# Patient Record
Sex: Female | Born: 1950 | Race: White | Hispanic: No | Smoking: Never smoker
Health system: Southern US, Community
[De-identification: ages and names within clinical notes are randomized; demographics above are authoritative.]

## PROBLEM LIST (undated history)

## (undated) ENCOUNTER — Ambulatory Visit: Disposition: A | Discharge status: Other Acute Inpatient Hospital

## (undated) DIAGNOSIS — M1990 Unspecified osteoarthritis, unspecified site: Secondary | ICD-10-CM

## (undated) DIAGNOSIS — C801 Malignant (primary) neoplasm, unspecified: Secondary | ICD-10-CM

## (undated) DIAGNOSIS — M4800 Spinal stenosis, site unspecified: Secondary | ICD-10-CM

## (undated) DIAGNOSIS — E039 Hypothyroidism, unspecified: Secondary | ICD-10-CM

## (undated) DIAGNOSIS — Z9981 Dependence on supplemental oxygen: Secondary | ICD-10-CM

## (undated) DIAGNOSIS — Z8679 Personal history of other diseases of the circulatory system: Secondary | ICD-10-CM

## (undated) DIAGNOSIS — I639 Cerebral infarction, unspecified: Secondary | ICD-10-CM

## (undated) DIAGNOSIS — R42 Dizziness and giddiness: Secondary | ICD-10-CM

## (undated) DIAGNOSIS — M5126 Other intervertebral disc displacement, lumbar region: Secondary | ICD-10-CM

## (undated) DIAGNOSIS — E785 Hyperlipidemia, unspecified: Secondary | ICD-10-CM

## (undated) DIAGNOSIS — G4730 Sleep apnea, unspecified: Secondary | ICD-10-CM

## (undated) DIAGNOSIS — Z9889 Other specified postprocedural states: Secondary | ICD-10-CM

## (undated) DIAGNOSIS — E079 Disorder of thyroid, unspecified: Secondary | ICD-10-CM

## (undated) DIAGNOSIS — M797 Fibromyalgia: Secondary | ICD-10-CM

## (undated) DIAGNOSIS — J449 Chronic obstructive pulmonary disease, unspecified: Secondary | ICD-10-CM

## (undated) DIAGNOSIS — R519 Headache, unspecified: Secondary | ICD-10-CM

## (undated) DIAGNOSIS — M539 Dorsopathy, unspecified: Secondary | ICD-10-CM

## (undated) DIAGNOSIS — F32A Depression, unspecified: Secondary | ICD-10-CM

## (undated) HISTORY — DX: Personal history of other diseases of the circulatory system: Z86.79

## (undated) HISTORY — DX: Other intervertebral disc displacement, lumbar region: M51.26

## (undated) HISTORY — DX: Disorder of thyroid, unspecified: E07.9

## (undated) HISTORY — DX: Other specified postprocedural states: Z98.890

## (undated) HISTORY — DX: Hypothyroidism, unspecified: E03.9

## (undated) HISTORY — PX: LUMBAR FUSION: SHX111

## (undated) HISTORY — DX: Sleep apnea, unspecified: G47.30

## (undated) HISTORY — PX: EYE SURGERY: SHX253

## (undated) HISTORY — DX: Chronic obstructive pulmonary disease, unspecified: J44.9

## (undated) HISTORY — DX: Hyperlipidemia, unspecified: E78.5

## (undated) HISTORY — PX: TOTAL SHOULDER REPLACEMENT: SUR1217

## (undated) HISTORY — PX: JOINT REPLACEMENT: SHX530

## (undated) HISTORY — DX: Cerebral infarction, unspecified: I63.9

## (undated) HISTORY — PX: APPENDECTOMY: SHX54

## (undated) HISTORY — DX: Unspecified osteoarthritis, unspecified site: M19.90

## (undated) HISTORY — PX: REPLACEMENT TOTAL KNEE: SUR1224

## (undated) HISTORY — PX: TOTAL THYROIDECTOMY: SHX2547

## (undated) HISTORY — PX: CARPAL TUNNEL RELEASE: SHX101

## (undated) HISTORY — PX: ABDOMINAL HYSTERECTOMY: SHX81

---

## 2004-10-31 ENCOUNTER — Ambulatory Visit: Payer: Self-pay | Admitting: Rheumatology

## 2004-12-19 ENCOUNTER — Ambulatory Visit: Payer: Self-pay | Admitting: Unknown Physician Specialty

## 2005-02-03 ENCOUNTER — Other Ambulatory Visit: Payer: Self-pay

## 2005-02-03 ENCOUNTER — Emergency Department: Payer: Self-pay | Admitting: Emergency Medicine

## 2005-02-27 ENCOUNTER — Ambulatory Visit: Payer: Self-pay | Admitting: Family Medicine

## 2005-03-24 ENCOUNTER — Ambulatory Visit: Payer: Self-pay | Admitting: Family Medicine

## 2005-03-31 ENCOUNTER — Ambulatory Visit: Payer: Self-pay | Admitting: Unknown Physician Specialty

## 2005-05-21 ENCOUNTER — Ambulatory Visit: Payer: Self-pay | Admitting: Rheumatology

## 2005-06-10 ENCOUNTER — Ambulatory Visit: Payer: Self-pay | Admitting: Unknown Physician Specialty

## 2005-06-20 ENCOUNTER — Ambulatory Visit: Payer: Self-pay | Admitting: Unknown Physician Specialty

## 2006-02-02 ENCOUNTER — Ambulatory Visit: Payer: Self-pay | Admitting: Unknown Physician Specialty

## 2006-04-14 ENCOUNTER — Emergency Department: Payer: Self-pay | Admitting: Emergency Medicine

## 2006-07-07 ENCOUNTER — Emergency Department: Payer: Self-pay | Admitting: Emergency Medicine

## 2006-10-03 ENCOUNTER — Emergency Department: Payer: Self-pay | Admitting: Emergency Medicine

## 2006-12-11 ENCOUNTER — Emergency Department: Payer: Self-pay | Admitting: Internal Medicine

## 2007-03-29 ENCOUNTER — Ambulatory Visit: Payer: Self-pay | Admitting: Unknown Physician Specialty

## 2007-05-12 ENCOUNTER — Ambulatory Visit: Payer: Self-pay | Admitting: Unknown Physician Specialty

## 2007-05-12 ENCOUNTER — Other Ambulatory Visit: Payer: Self-pay

## 2007-05-13 ENCOUNTER — Ambulatory Visit: Payer: Self-pay | Admitting: Unknown Physician Specialty

## 2007-05-19 ENCOUNTER — Ambulatory Visit: Payer: Self-pay | Admitting: Unknown Physician Specialty

## 2007-06-21 ENCOUNTER — Ambulatory Visit: Payer: Self-pay | Admitting: Internal Medicine

## 2007-09-09 ENCOUNTER — Emergency Department: Payer: Self-pay | Admitting: Emergency Medicine

## 2007-09-21 ENCOUNTER — Ambulatory Visit: Payer: Self-pay | Admitting: Internal Medicine

## 2008-01-05 ENCOUNTER — Ambulatory Visit: Payer: Self-pay | Admitting: Unknown Physician Specialty

## 2008-03-14 ENCOUNTER — Ambulatory Visit: Payer: Self-pay | Admitting: Internal Medicine

## 2008-04-11 ENCOUNTER — Ambulatory Visit: Payer: Self-pay | Admitting: Nurse Practitioner

## 2008-05-18 ENCOUNTER — Ambulatory Visit: Payer: Self-pay | Admitting: Gastroenterology

## 2008-05-20 ENCOUNTER — Emergency Department: Payer: Self-pay | Admitting: Emergency Medicine

## 2008-05-24 ENCOUNTER — Other Ambulatory Visit: Payer: Self-pay

## 2008-05-24 ENCOUNTER — Ambulatory Visit: Payer: Self-pay | Admitting: Unknown Physician Specialty

## 2008-05-29 ENCOUNTER — Inpatient Hospital Stay: Payer: Self-pay | Admitting: Unknown Physician Specialty

## 2008-08-24 ENCOUNTER — Ambulatory Visit: Payer: Self-pay | Admitting: Unknown Physician Specialty

## 2008-09-25 ENCOUNTER — Ambulatory Visit: Payer: Self-pay | Admitting: Unknown Physician Specialty

## 2008-09-25 ENCOUNTER — Ambulatory Visit: Payer: Self-pay | Admitting: Internal Medicine

## 2008-10-02 ENCOUNTER — Inpatient Hospital Stay: Payer: Self-pay | Admitting: Unknown Physician Specialty

## 2009-01-11 ENCOUNTER — Ambulatory Visit: Payer: Self-pay | Admitting: Unknown Physician Specialty

## 2009-01-18 ENCOUNTER — Ambulatory Visit: Payer: Self-pay | Admitting: Unknown Physician Specialty

## 2009-06-04 ENCOUNTER — Ambulatory Visit: Payer: Self-pay | Admitting: Unknown Physician Specialty

## 2009-06-05 ENCOUNTER — Ambulatory Visit: Payer: Self-pay | Admitting: Gastroenterology

## 2009-06-07 ENCOUNTER — Inpatient Hospital Stay: Payer: Self-pay | Admitting: Unknown Physician Specialty

## 2010-03-28 ENCOUNTER — Ambulatory Visit: Payer: Self-pay

## 2010-04-11 ENCOUNTER — Ambulatory Visit: Payer: Self-pay | Admitting: Unknown Physician Specialty

## 2010-04-17 ENCOUNTER — Ambulatory Visit: Payer: Self-pay | Admitting: Cardiovascular Disease

## 2010-04-18 ENCOUNTER — Ambulatory Visit: Payer: Self-pay | Admitting: Unknown Physician Specialty

## 2010-10-27 ENCOUNTER — Emergency Department: Payer: Self-pay | Admitting: Emergency Medicine

## 2012-03-05 DIAGNOSIS — G43009 Migraine without aura, not intractable, without status migrainosus: Secondary | ICD-10-CM | POA: Insufficient documentation

## 2012-10-06 ENCOUNTER — Ambulatory Visit: Payer: Self-pay | Admitting: Pain Medicine

## 2012-10-15 ENCOUNTER — Ambulatory Visit: Payer: Self-pay | Admitting: Pain Medicine

## 2012-12-06 ENCOUNTER — Ambulatory Visit: Payer: Self-pay | Admitting: Pain Medicine

## 2012-12-06 LAB — SEDIMENTATION RATE: Erythrocyte Sed Rate: 11 mm/hr (ref 0–30)

## 2012-12-15 ENCOUNTER — Ambulatory Visit: Payer: Self-pay | Admitting: Pain Medicine

## 2012-12-23 ENCOUNTER — Ambulatory Visit: Payer: Self-pay | Admitting: Pain Medicine

## 2013-01-12 ENCOUNTER — Ambulatory Visit: Payer: Self-pay | Admitting: Pain Medicine

## 2013-03-11 ENCOUNTER — Ambulatory Visit: Payer: Self-pay | Admitting: Pain Medicine

## 2013-04-28 ENCOUNTER — Ambulatory Visit: Payer: Self-pay | Admitting: Pain Medicine

## 2013-05-30 ENCOUNTER — Ambulatory Visit: Payer: Self-pay | Admitting: Pain Medicine

## 2013-06-02 ENCOUNTER — Ambulatory Visit: Payer: Self-pay | Admitting: Pain Medicine

## 2013-06-27 ENCOUNTER — Ambulatory Visit: Payer: Self-pay | Admitting: Pain Medicine

## 2013-09-09 ENCOUNTER — Ambulatory Visit: Payer: Self-pay | Admitting: Pain Medicine

## 2013-10-18 ENCOUNTER — Ambulatory Visit: Payer: Self-pay | Admitting: Pain Medicine

## 2013-10-31 ENCOUNTER — Ambulatory Visit: Payer: Self-pay | Admitting: Pain Medicine

## 2013-11-04 ENCOUNTER — Ambulatory Visit: Payer: Self-pay | Admitting: Pain Medicine

## 2013-11-10 ENCOUNTER — Ambulatory Visit: Payer: Self-pay | Admitting: Pain Medicine

## 2013-12-05 ENCOUNTER — Ambulatory Visit: Payer: Self-pay | Admitting: Pain Medicine

## 2013-12-08 ENCOUNTER — Ambulatory Visit: Payer: Self-pay | Admitting: Pain Medicine

## 2013-12-26 ENCOUNTER — Ambulatory Visit: Payer: Self-pay | Admitting: Pain Medicine

## 2014-01-03 ENCOUNTER — Ambulatory Visit: Payer: Self-pay | Admitting: Pain Medicine

## 2014-02-20 ENCOUNTER — Ambulatory Visit: Payer: Self-pay | Admitting: Unknown Physician Specialty

## 2014-02-24 ENCOUNTER — Ambulatory Visit: Payer: Self-pay | Admitting: Pain Medicine

## 2014-03-02 ENCOUNTER — Ambulatory Visit: Payer: Self-pay | Admitting: Pain Medicine

## 2014-03-13 ENCOUNTER — Ambulatory Visit: Payer: Self-pay | Admitting: Neurology

## 2014-03-22 ENCOUNTER — Ambulatory Visit: Payer: Self-pay | Admitting: Pain Medicine

## 2014-04-06 ENCOUNTER — Ambulatory Visit: Payer: Self-pay | Admitting: Pain Medicine

## 2014-05-18 ENCOUNTER — Inpatient Hospital Stay (HOSPITAL_COMMUNITY): Admit: 2014-05-18 | Payer: Self-pay | Admitting: Orthopedic Surgery

## 2014-05-18 ENCOUNTER — Encounter (HOSPITAL_COMMUNITY): Payer: Self-pay

## 2014-05-18 SURGERY — ARTHROPLASTY, SHOULDER, TOTAL
Anesthesia: Choice | Laterality: Right

## 2014-09-22 ENCOUNTER — Ambulatory Visit: Payer: Self-pay | Admitting: Pain Medicine

## 2015-06-04 ENCOUNTER — Encounter: Payer: Self-pay | Admitting: Pain Medicine

## 2015-06-04 ENCOUNTER — Ambulatory Visit: Payer: Medicare Other | Attending: Pain Medicine | Admitting: Pain Medicine

## 2015-06-04 VITALS — BP 140/59 | HR 67 | Temp 98.2°F | Resp 16 | Ht 65.0 in | Wt 215.0 lb

## 2015-06-04 DIAGNOSIS — M545 Low back pain, unspecified: Secondary | ICD-10-CM

## 2015-06-04 DIAGNOSIS — M4802 Spinal stenosis, cervical region: Secondary | ICD-10-CM | POA: Insufficient documentation

## 2015-06-04 DIAGNOSIS — M47896 Other spondylosis, lumbar region: Secondary | ICD-10-CM | POA: Insufficient documentation

## 2015-06-04 DIAGNOSIS — E669 Obesity, unspecified: Secondary | ICD-10-CM | POA: Insufficient documentation

## 2015-06-04 DIAGNOSIS — M5416 Radiculopathy, lumbar region: Secondary | ICD-10-CM | POA: Diagnosis not present

## 2015-06-04 DIAGNOSIS — M542 Cervicalgia: Secondary | ICD-10-CM | POA: Diagnosis present

## 2015-06-04 DIAGNOSIS — F1190 Opioid use, unspecified, uncomplicated: Secondary | ICD-10-CM

## 2015-06-04 DIAGNOSIS — G4730 Sleep apnea, unspecified: Secondary | ICD-10-CM | POA: Insufficient documentation

## 2015-06-04 DIAGNOSIS — M546 Pain in thoracic spine: Secondary | ICD-10-CM | POA: Diagnosis present

## 2015-06-04 DIAGNOSIS — G894 Chronic pain syndrome: Secondary | ICD-10-CM | POA: Diagnosis not present

## 2015-06-04 DIAGNOSIS — Z9889 Other specified postprocedural states: Secondary | ICD-10-CM

## 2015-06-04 DIAGNOSIS — Z79891 Long term (current) use of opiate analgesic: Secondary | ICD-10-CM

## 2015-06-04 DIAGNOSIS — G8929 Other chronic pain: Secondary | ICD-10-CM

## 2015-06-04 DIAGNOSIS — M539 Dorsopathy, unspecified: Secondary | ICD-10-CM

## 2015-06-04 DIAGNOSIS — M961 Postlaminectomy syndrome, not elsewhere classified: Secondary | ICD-10-CM

## 2015-06-04 DIAGNOSIS — F329 Major depressive disorder, single episode, unspecified: Secondary | ICD-10-CM | POA: Insufficient documentation

## 2015-06-04 DIAGNOSIS — M5126 Other intervertebral disc displacement, lumbar region: Secondary | ICD-10-CM

## 2015-06-04 DIAGNOSIS — G4486 Cervicogenic headache: Secondary | ICD-10-CM | POA: Insufficient documentation

## 2015-06-04 DIAGNOSIS — R51 Headache: Secondary | ICD-10-CM

## 2015-06-04 DIAGNOSIS — M4726 Other spondylosis with radiculopathy, lumbar region: Secondary | ICD-10-CM | POA: Diagnosis not present

## 2015-06-04 DIAGNOSIS — Z5181 Encounter for therapeutic drug level monitoring: Secondary | ICD-10-CM

## 2015-06-04 DIAGNOSIS — E039 Hypothyroidism, unspecified: Secondary | ICD-10-CM | POA: Diagnosis not present

## 2015-06-04 DIAGNOSIS — M797 Fibromyalgia: Secondary | ICD-10-CM | POA: Insufficient documentation

## 2015-06-04 DIAGNOSIS — F32A Depression, unspecified: Secondary | ICD-10-CM | POA: Insufficient documentation

## 2015-06-04 DIAGNOSIS — Z8679 Personal history of other diseases of the circulatory system: Secondary | ICD-10-CM

## 2015-06-04 DIAGNOSIS — M5441 Lumbago with sciatica, right side: Secondary | ICD-10-CM

## 2015-06-04 DIAGNOSIS — J439 Emphysema, unspecified: Secondary | ICD-10-CM | POA: Insufficient documentation

## 2015-06-04 DIAGNOSIS — E7800 Pure hypercholesterolemia, unspecified: Secondary | ICD-10-CM | POA: Insufficient documentation

## 2015-06-04 DIAGNOSIS — F1120 Opioid dependence, uncomplicated: Secondary | ICD-10-CM

## 2015-06-04 DIAGNOSIS — M5442 Lumbago with sciatica, left side: Secondary | ICD-10-CM

## 2015-06-04 HISTORY — DX: Hypothyroidism, unspecified: E03.9

## 2015-06-04 HISTORY — DX: Personal history of other diseases of the circulatory system: Z86.79

## 2015-06-04 HISTORY — DX: Other intervertebral disc displacement, lumbar region: M51.26

## 2015-06-04 HISTORY — DX: Other specified postprocedural states: Z98.890

## 2015-06-04 MED ORDER — MELOXICAM 15 MG PO TABS: 15.0000 mg | ORAL_TABLET | Freq: Every day | ORAL | Status: DC

## 2015-06-04 MED ORDER — TRAMADOL HCL 50 MG PO TABS: 100.0000 mg | ORAL_TABLET | Freq: Four times a day (QID) | ORAL | Status: DC | PRN

## 2015-06-04 NOTE — Progress Notes (Signed)
..  Safety precautions to be maintained throughout the outpatient stay will include: orient to surroundings, keep bed in low position, maintain call bell within reach at all times, provide assistance with transfer out of bed and ambulation.  47/240 Tramadol remaining

## 2015-06-04 NOTE — Progress Notes (Signed)
Patient's Name: Regina Campos MRN: [id number] DOB: [birth year] DOS: 06/04/2015  Primary Reason(s) for Visit: Evaluation of uncontrolled established, chronic problem. CC: Back Pain and Neck Pain   HPI:   [name] is a 64 y.o. year old, female patient, who returns today as an established patient. She has Encounter for therapeutic drug level monitoring; Long term current use of opiate analgesic; Uncomplicated opioid dependence ([place]); Opiate use; Chronic neck pain; Chronic low back pain; Lumbar radicular pain; Osteoarthritis of spine with radiculopathy, lumbar region; Cervical spondylosis without myelopathy; Chronic pain syndrome; Disorder of shoulder; Pulmonary emphysema ([place]); Atypical migraine; Apnea, sleep; Displacement of lumbar intervertebral disc; High cholesterol; Hypothyroidism; History of cervical spinal surgery; Foraminal stenosis of cervical region; Arthropathy of shoulder region; Cervicogenic headache; Pain in joint involving right lower leg; Fibromyalgia; History of cardiac arrhythmia; Depression; Obesity; Failed back surgical syndrome; Spinal stenosis in cervical region; and Chronic pain on her problem list.. Her primarily concern today is the Back Pain and Neck Pain   The patient comes into the clinic today for her medication assessment and possible refill. In addition to this, she indicates that she is currently having a flareup of her low back pain and leg pain as well as of her neck pain. When I asked her which one was worse she indicated that for now it is the low back pain and the leg pain. Because of this, she would like to go ahead and proceed with a right-sided lumbar epidural steroid injection under fluoroscopic guidance. She indicated that she has done them in the past without sedation and that she doesn't need any. In addition, she indicated that the best one that worked for her was the one that was done around May 2015. In looking back at my records, the revealed that the  patient had a right-sided L3-4 lumbar epidural steroid injection plus a right-sided L4 transforaminal epidural steroid injection under fluoroscopic guidance. I rendered this by the patient and she indicated that she wants the same pain done. I will be scheduling the patient to come back for that procedure. In addition, the patient indicates that as soon as we take care of the low back pain and leg pain, she would like for me to also treat her neck again. The last time that we did some performed neck was on 10/31/2014 at which time we did a right-sided cervical epidural steroid injection. In the past, the patient has had pain that seems to be radicular in nature, but we have also found her to have a midline C6 spinous process trigger point which we occasionally will inject and she indicates getting excellent relief from that as well.  Pharmacotherapy Review: Side-effects or Adverse reactions: None reported. Effectiveness: Described as relatively effective, allowing for increase in activities of daily living (ADL). Onset of action: Within expected pharmacological parameters. Duration of action: Within normal limits for medication. Peak effect: Timing and results are as within normal expected parameters. [place] PMP: Compliant with practice rules and regulations. DST: Compliant with practice rules and regulations. Lab work: No new labs ordered by our practice. Treatment compliance: Compliant. Substance Use Disorder (SUD) Risk Level: Low Planned course of action: Continue therapy as is.  Allergies: Ms. Welle has No Known Allergies.  Meds: The patient has a current medication list which includes the following prescription(s): albuterol, budesonide-formoterol, fenofibrate, levothyroxine, lubiprostone, meloxicam, polyethylene glycol powder, rosuvastatin, sumatriptan, and tramadol. Requested Prescriptions   Signed Prescriptions Disp Refills  . meloxicam (MOBIC) 15 MG tablet 30 tablet 2  Sig: Take 1  tablet (15 mg total) by mouth daily.  . traMADol (ULTRAM) 50 MG tablet 240 tablet 2    Sig: Take 2 tablets (100 mg total) by mouth every 6 (six) hours as needed.    ROS: Constitutional: Afebrile, no chills, well hydrated and well nourished Gastrointestinal: negative Musculoskeletal:negative Neurological: negative Behavioral/Psych: negative  PFSH: Medical:  Ms. Bennette  has a past medical history of Hyperlipidemia; Arthritis; Thyroid disease; Hypothyroidism (06/04/2015); History of cervical spinal surgery (06/04/2015); and History of cardiac arrhythmia (06/04/2015). Family: family history includes Cancer in her mother; Heart disease in her father. Surgical:  has past surgical history that includes Abdominal hysterectomy; Total shoulder replacement; Replacement total knee; Appendectomy; Lumbar fusion; and Carpal tunnel release. Tobacco:  reports that she has never smoked. She does not have any smokeless tobacco history on file. Alcohol:  has no alcohol history on file. Drug:  has no drug history on file.  Physical Exam: Vitals:  Today's Vitals   06/04/15 1431 06/04/15 1433  BP: 140/59   Pulse: 67   Temp: 98.2 F (36.8 C)   TempSrc: Oral   Resp: 16   Height: 5\' 5"  (1.651 m)   Weight: 215 lb (97.523 kg)   SpO2: 96%   PainSc:  6   Calculated BMI: Body mass index is 35.78 kg/(m^2). General appearance: alert, cooperative, appears stated age, mild distress and moderately obese Eyes: conjunctivae/corneas clear. PERRL, EOM's intact. Fundi benign. Lungs: No evidence respiratory distress, no audible rales or ronchi and no use of accessory muscles of respiration Neck: no adenopathy, no carotid bruit, no JVD, supple, symmetrical, trachea midline and thyroid not enlarged, symmetric, no tenderness/mass/nodules Back: symmetric, no curvature. ROM normal. No CVA tenderness. Extremities: extremities normal, atraumatic, no cyanosis or edema Pulses: 2+ and symmetric Skin: Skin color, texture,  turgor normal. No rashes or lesions Neurologic: Gait: Antalgic    Assessment: Encounter Diagnosis:  Primary Diagnosis: Lumbar radicular pain [M54.16]  Plan: Charnel was seen today for back pain and neck pain.  Diagnoses and all orders for this visit:  Lumbar radicular pain -     LUMBAR EPIDURAL STEROID INJECTION; Future -     LUMBAR EPIDURAL STEROID INJECTION; Future  Chronic pain syndrome  Osteoarthritis of spine with radiculopathy, lumbar region  Chronic low back pain  Opiate use  Uncomplicated opioid dependence ([place])  Long term current use of opiate analgesic -     Drugs of abuse screen w/o alc, rtn urine-sln; Future  Encounter for therapeutic drug level monitoring  Failed back surgical syndrome  Chronic pain  Other orders -     meloxicam (MOBIC) 15 MG tablet; Take 1 tablet (15 mg total) by mouth daily. -     traMADol (ULTRAM) 50 MG tablet; Take 2 tablets (100 mg total) by mouth every 6 (six) hours as needed.     Patient Instructions   GENERAL RISKS AND COMPLICATIONS  What are the risk, side effects and possible complications? Generally speaking, most procedures are safe.  However, with any procedure there are risks, side effects, and the possibility of complications.  The risks and complications are dependent upon the sites that are lesioned, or the type of nerve block to be performed.  The closer the procedure is to the spine, the more serious the risks are.  Great care is taken when placing the radio frequency needles, block needles or lesioning probes, but sometimes complications can occur. 1. Infection: Any time there is an injection through the skin, there is a  risk of infection.  This is why sterile conditions are used for these blocks.  There are four possible types of infection. 1. Localized skin infection. 2. Central Nervous System Infection-This can be in the form of Meningitis, which can be deadly. 3. Epidural Infections-This can be in the form of an  epidural abscess, which can cause pressure inside of the spine, causing compression of the spinal cord with subsequent paralysis. This would require an emergency surgery to decompress, and there are no guarantees that the patient would recover from the paralysis. 4. Discitis-This is an infection of the intervertebral discs.  It occurs in about 1% of discography procedures.  It is difficult to treat and it may lead to surgery.        2. Pain: the needles have to go through skin and soft tissues, will cause soreness.       3. Damage to internal structures:  The nerves to be lesioned may be near blood vessels or    other nerves which can be potentially damaged.       4. Bleeding: Bleeding is more common if the patient is taking blood thinners such as  aspirin, Coumadin, Ticiid, Plavix, etc., or if he/she have some genetic predisposition  such as hemophilia. Bleeding into the spinal canal can cause compression of the spinal  cord with subsequent paralysis.  This would require an emergency surgery to  decompress and there are no guarantees that the patient would recover from the  paralysis.       5. Pneumothorax:  Puncturing of a lung is a possibility, every time a needle is introduced in  the area of the chest or upper back.  Pneumothorax refers to free air around the  collapsed lung(s), inside of the thoracic cavity (chest cavity).  Another two possible  complications related to a similar event would include: Hemothorax and Chylothorax.   These are variations of the Pneumothorax, where instead of air around the collapsed  lung(s), you may have blood or chyle, respectively.       6. Spinal headaches: They may occur with any procedures in the area of the spine.       7. Persistent CSF (Cerebro-Spinal Fluid) leakage: This is a rare problem, but may occur  with prolonged intrathecal or epidural catheters either due to the formation of a fistulous  track or a dural tear.       8. Nerve damage: By working so close  to the spinal cord, there is always a possibility of  nerve damage, which could be as serious as a permanent spinal cord injury with  paralysis.       9. Death:  Although rare, severe deadly allergic reactions known as "Anaphylactic  reaction" can occur to any of the medications used.      10. Worsening of the symptoms:  We can always make thing worse.  What are the chances of something like this happening? Chances of any of this occuring are extremely low.  By statistics, you have more of a chance of getting killed in a motor vehicle accident: while driving to the hospital than any of the above occurring .  Nevertheless, you should be aware that they are possibilities.  In general, it is similar to taking a shower.  Everybody knows that you can slip, hit your head and get killed.  Does that mean that you should not shower again?  Nevertheless always keep in mind that statistics do not mean anything if you happen  to be on the wrong side of them.  Even if a procedure has a 1 (one) in a 1,000,000 (million) chance of going wrong, it you happen to be that one..Also, keep in mind that by statistics, you have more of a chance of having something go wrong when taking medications.  Who should not have this procedure? If you are on a blood thinning medication (e.g. Coumadin, Plavix, see list of "Blood Thinners"), or if you have an active infection going on, you should not have the procedure.  If you are taking any blood thinners, please inform your physician.  How should I prepare for this procedure?  Do not eat or drink anything at least six hours prior to the procedure.  Bring a driver with you .  It cannot be a taxi.  Come accompanied by an adult that can drive you back, and that is strong enough to help you if your legs get weak or numb from the local anesthetic.  Take all of your medicines the morning of the procedure with just enough water to swallow them.  If you have diabetes, make sure that you  are scheduled to have your procedure done first thing in the morning, whenever possible.  If you have diabetes, take only half of your insulin dose and notify our nurse that you have done so as soon as you arrive at the clinic.  If you are diabetic, but only take blood sugar pills (oral hypoglycemic), then do not take them on the morning of your procedure.  You may take them after you have had the procedure.  Do not take aspirin or any aspirin-containing medications, at least eleven (11) days prior to the procedure.  They may prolong bleeding.  Wear loose fitting clothing that may be easy to take off and that you would not mind if it got stained with Betadine or blood.  Do not wear any jewelry or perfume  Remove any nail coloring.  It will interfere with some of our monitoring equipment.  NOTE: Remember that this is not meant to be interpreted as a complete list of all possible complications.  Unforeseen problems may occur.  BLOOD THINNERS The following drugs contain aspirin or other products, which can cause increased bleeding during surgery and should not be taken for 2 weeks prior to and 1 week after surgery.  If you should need take something for relief of minor pain, you may take acetaminophen which is found in Tylenol,m Datril, Anacin-3 and Panadol. It is not blood thinner. The products listed below are.  Do not take any of the products listed below in addition to any listed on your instruction sheet.  A.P.C or A.P.C with Codeine Codeine Phosphate Capsules #3 Ibuprofen Ridaura  ABC compound Congesprin Imuran rimadil  Advil Cope Indocin Robaxisal  Alka-Seltzer Effervescent Pain Reliever and Antacid Coricidin or Coricidin-D  Indomethacin Rufen  Alka-Seltzer plus Cold Medicine Cosprin Ketoprofen S-A-C Tablets  Anacin Analgesic Tablets or Capsules Coumadin Korlgesic Salflex  Anacin Extra Strength Analgesic tablets or capsules CP-2 Tablets Lanoril Salicylate  Anaprox Cuprimine Capsules  Levenox Salocol  Anexsia-D Dalteparin Magan Salsalate  Anodynos Darvon compound Magnesium Salicylate Sine-off  Ansaid Dasin Capsules Magsal Sodium Salicylate  Anturane Depen Capsules Marnal Soma  APF Arthritis pain formula Dewitt's Pills Measurin Stanback  Argesic Dia-Gesic Meclofenamic Sulfinpyrazone  Arthritis Bayer Timed Release Aspirin Diclofenac Meclomen Sulindac  Arthritis pain formula Anacin Dicumarol Medipren Supac  Analgesic (Safety coated) Arthralgen Diffunasal Mefanamic Suprofen  Arthritis Strength Bufferin Dihydrocodeine Mepro  Compound Suprol  Arthropan liquid Dopirydamole Methcarbomol with Aspirin Synalgos  ASA tablets/Enseals Disalcid Micrainin Tagament  Ascriptin Doan's Midol Talwin  Ascriptin A/D Dolene Mobidin Tanderil  Ascriptin Extra Strength Dolobid Moblgesic Ticlid  Ascriptin with Codeine Doloprin or Doloprin with Codeine Momentum Tolectin  Asperbuf Duoprin Mono-gesic Trendar  Aspergum Duradyne Motrin or Motrin IB Triminicin  Aspirin plain, buffered or enteric coated Durasal Myochrisine Trigesic  Aspirin Suppositories Easprin Nalfon Trillsate  Aspirin with Codeine Ecotrin Regular or Extra Strength Naprosyn Uracel  Atromid-S Efficin Naproxen Ursinus  Auranofin Capsules Elmiron Neocylate Vanquish  Axotal Emagrin Norgesic Verin  Azathioprine Empirin or Empirin with Codeine Normiflo Vitamin E  Azolid Emprazil Nuprin Voltaren  Bayer Aspirin plain, buffered or children's or timed BC Tablets or powders Encaprin Orgaran Warfarin Sodium  Buff-a-Comp Enoxaparin Orudis Zorpin  Buff-a-Comp with Codeine Equegesic Os-Cal-Gesic   Buffaprin Excedrin plain, buffered or Extra Strength Oxalid   Bufferin Arthritis Strength Feldene Oxphenbutazone   Bufferin plain or Extra Strength Feldene Capsules Oxycodone with Aspirin   Bufferin with Codeine Fenoprofen Fenoprofen Pabalate or Pabalate-SF   Buffets II Flogesic Panagesic   Buffinol plain or Extra Strength Florinal or Florinal with  Codeine Panwarfarin   Buf-Tabs Flurbiprofen Penicillamine   Butalbital Compound Four-way cold tablets Penicillin   Butazolidin Fragmin Pepto-Bismol   Carbenicillin Geminisyn Percodan   Carna Arthritis Reliever Geopen Persantine   Carprofen Gold's salt Persistin   Chloramphenicol Goody's Phenylbutazone   Chloromycetin Haltrain Piroxlcam   Clmetidine heparin Plaquenil   Cllnoril Hyco-pap Ponstel   Clofibrate Hydroxy chloroquine Propoxyphen         Before stopping any of these medications, be sure to consult the physician who ordered them.  Some, such as Coumadin (Warfarin) are ordered to prevent or treat serious conditions such as "deep thrombosis", "pumonary embolisms", and other heart problems.  The amount of time that you may need off of the medication may also vary with the medication and the reason for which you were taking it.  If you are taking any of these medications, please make sure you notify your pain physician before you undergo any procedures.         Epidural Steroid Injection Patient Information  Description: The epidural space surrounds the nerves as they exit the spinal cord.  In some patients, the nerves can be compressed and inflamed by a bulging disc or a tight spinal canal (spinal stenosis).  By injecting steroids into the epidural space, we can bring irritated nerves into direct contact with a potentially helpful medication.  These steroids act directly on the irritated nerves and can reduce swelling and inflammation which often leads to decreased pain.  Epidural steroids may be injected anywhere along the spine and from the neck to the low back depending upon the location of your pain.   After numbing the skin with local anesthetic (like Novocaine), a small needle is passed into the epidural space slowly.  You may experience a sensation of pressure while this is being done.  The entire block usually last less than 10 minutes.  Conditions which may be treated by  epidural steroids:   Low back and leg pain  Neck and arm pain  Spinal stenosis  Post-laminectomy syndrome  Herpes zoster (shingles) pain  Pain from compression fractures  Preparation for the injection:  1. Do not eat any solid food or dairy products within 6 hours of your appointment.  2. You may drink clear liquids up to 2 hours before appointment.  Clear liquids include water,  black coffee, juice or soda.  No milk or cream please. 3. You may take your regular medication, including pain medications, with a sip of water before your appointment  Diabetics should hold regular insulin (if taken separately) and take 1/2 normal NPH dos the morning of the procedure.  Carry some sugar containing items with you to your appointment. 4. A driver must accompany you and be prepared to drive you home after your procedure.  5. Bring all your current medications with your. 6. An IV may be inserted and sedation may be given at the discretion of the physician.   7. A blood pressure cuff, EKG and other monitors will often be applied during the procedure.  Some patients may need to have extra oxygen administered for a short period. 8. You will be asked to provide medical information, including your allergies, prior to the procedure.  We must know immediately if you are taking blood thinners (like Coumadin/Warfarin)  Or if you are allergic to IV iodine contrast (dye). We must know if you could possible be pregnant.  Possible side-effects:  Bleeding from needle site  Infection (rare, may require surgery)  Nerve injury (rare)  Numbness & tingling (temporary)  Difficulty urinating (rare, temporary)  Spinal headache ( a headache worse with upright posture)  Light -headedness (temporary)  Pain at injection site (several days)  Decreased blood pressure (temporary)  Weakness in arm/leg (temporary)  Pressure sensation in back/neck (temporary)  Call if you experience:  Fever/chills associated  with headache or increased back/neck pain.  Headache worsened by an upright position.  New onset weakness or numbness of an extremity below the injection site  Hives or difficulty breathing (go to the emergency room)  Inflammation or drainage at the infection site  Severe back/neck pain  Any new symptoms which are concerning to you  Please note:  Although the local anesthetic injected can often make your back or neck feel good for several hours after the injection, the pain will likely return.  It takes 3-7 days for steroids to work in the epidural space.  You may not notice any pain relief for at least that one week.  If effective, we will often do a series of three injections spaced 3-6 weeks apart to maximally decrease your pain.  After the initial series, we generally will wait several months before considering a repeat injection of the same type.  If you have any questions, please call [phone] [facility]   Medications discontinued today:  Medications Discontinued During This Encounter  Medication Reason  . meloxicam (MOBIC) 15 MG tablet Reorder  . traMADol (ULTRAM) 50 MG tablet Reorder   Medications administered today:  Ms. Mulnix had no medications administered during this visit.  Primary Care Physician: No primary care provider on file. Location: [facility] Note by: Kalin Kyler A. Dossie Arbour, M.D, DABA, DABAPM, DABPM, DABIPP, FIPP

## 2015-06-04 NOTE — Patient Instructions (Signed)
GENERAL RISKS AND COMPLICATIONS  What are the risk, side effects and possible complications? Generally speaking, most procedures are safe.  However, with any procedure there are risks, side effects, and the possibility of complications.  The risks and complications are dependent upon the sites that are lesioned, or the type of nerve block to be performed.  The closer the procedure is to the spine, the more serious the risks are.  Great care is taken when placing the radio frequency needles, block needles or lesioning probes, but sometimes complications can occur. 1. Infection: Any time there is an injection through the skin, there is a risk of infection.  This is why sterile conditions are used for these blocks.  There are four possible types of infection. 1. Localized skin infection. 2. Central Nervous System Infection-This can be in the form of Meningitis, which can be deadly. 3. Epidural Infections-This can be in the form of an epidural abscess, which can cause pressure inside of the spine, causing compression of the spinal cord with subsequent paralysis. This would require an emergency surgery to decompress, and there are no guarantees that the patient would recover from the paralysis. 4. Discitis-This is an infection of the intervertebral discs.  It occurs in about 1% of discography procedures.  It is difficult to treat and it may lead to surgery.        2. Pain: the needles have to go through skin and soft tissues, will cause soreness.       3. Damage to internal structures:  The nerves to be lesioned may be near blood vessels or    other nerves which can be potentially damaged.       4. Bleeding: Bleeding is more common if the patient is taking blood thinners such as  aspirin, Coumadin, Ticiid, Plavix, etc., or if he/she have some genetic predisposition  such as hemophilia. Bleeding into the spinal canal can cause compression of the spinal  cord with subsequent paralysis.  This would require an  emergency surgery to  decompress and there are no guarantees that the patient would recover from the  paralysis.       5. Pneumothorax:  Puncturing of a lung is a possibility, every time a needle is introduced in  the area of the chest or upper back.  Pneumothorax refers to free air around the  collapsed lung(s), inside of the thoracic cavity (chest cavity).  Another two possible  complications related to a similar event would include: Hemothorax and Chylothorax.   These are variations of the Pneumothorax, where instead of air around the collapsed  lung(s), you may have blood or chyle, respectively.       6. Spinal headaches: They may occur with any procedures in the area of the spine.       7. Persistent CSF (Cerebro-Spinal Fluid) leakage: This is a rare problem, but may occur  with prolonged intrathecal or epidural catheters either due to the formation of a fistulous  track or a dural tear.       8. Nerve damage: By working so close to the spinal cord, there is always a possibility of  nerve damage, which could be as serious as a permanent spinal cord injury with  paralysis.       9. Death:  Although rare, severe deadly allergic reactions known as "Anaphylactic  reaction" can occur to any of the medications used.      10. Worsening of the symptoms:  We can always make thing worse.    What are the chances of something like this happening? Chances of any of this occuring are extremely low.  By statistics, you have more of a chance of getting killed in a motor vehicle accident: while driving to the hospital than any of the above occurring .  Nevertheless, you should be aware that they are possibilities.  In general, it is similar to taking a shower.  Everybody knows that you can slip, hit your head and get killed.  Does that mean that you should not shower again?  Nevertheless always keep in mind that statistics do not mean anything if you happen to be on the wrong side of them.  Even if a procedure has a 1  (one) in a 1,000,000 (million) chance of going wrong, it you happen to be that one..Also, keep in mind that by statistics, you have more of a chance of having something go wrong when taking medications.  Who should not have this procedure? If you are on a blood thinning medication (e.g. Coumadin, Plavix, see list of "Blood Thinners"), or if you have an active infection going on, you should not have the procedure.  If you are taking any blood thinners, please inform your physician.  How should I prepare for this procedure?  Do not eat or drink anything at least six hours prior to the procedure.  Bring a driver with you .  It cannot be a taxi.  Come accompanied by an adult that can drive you back, and that is strong enough to help you if your legs get weak or numb from the local anesthetic.  Take all of your medicines the morning of the procedure with just enough water to swallow them.  If you have diabetes, make sure that you are scheduled to have your procedure done first thing in the morning, whenever possible.  If you have diabetes, take only half of your insulin dose and notify our nurse that you have done so as soon as you arrive at the clinic.  If you are diabetic, but only take blood sugar pills (oral hypoglycemic), then do not take them on the morning of your procedure.  You may take them after you have had the procedure.  Do not take aspirin or any aspirin-containing medications, at least eleven (11) days prior to the procedure.  They may prolong bleeding.  Wear loose fitting clothing that may be easy to take off and that you would not mind if it got stained with Betadine or blood.  Do not wear any jewelry or perfume  Remove any nail coloring.  It will interfere with some of our monitoring equipment.  NOTE: Remember that this is not meant to be interpreted as a complete list of all possible complications.  Unforeseen problems may occur.  BLOOD THINNERS The following drugs  contain aspirin or other products, which can cause increased bleeding during surgery and should not be taken for 2 weeks prior to and 1 week after surgery.  If you should need take something for relief of minor pain, you may take acetaminophen which is found in Tylenol,m Datril, Anacin-3 and Panadol. It is not blood thinner. The products listed below are.  Do not take any of the products listed below in addition to any listed on your instruction sheet.  A.P.C or A.P.C with Codeine Codeine Phosphate Capsules #3 Ibuprofen Ridaura  ABC compound Congesprin Imuran rimadil  Advil Cope Indocin Robaxisal  Alka-Seltzer Effervescent Pain Reliever and Antacid Coricidin or Coricidin-D  Indomethacin Rufen    Alka-Seltzer plus Cold Medicine Cosprin Ketoprofen S-A-C Tablets  Anacin Analgesic Tablets or Capsules Coumadin Korlgesic Salflex  Anacin Extra Strength Analgesic tablets or capsules CP-2 Tablets Lanoril Salicylate  Anaprox Cuprimine Capsules Levenox Salocol  Anexsia-D Dalteparin Magan Salsalate  Anodynos Darvon compound Magnesium Salicylate Sine-off  Ansaid Dasin Capsules Magsal Sodium Salicylate  Anturane Depen Capsules Marnal Soma  APF Arthritis pain formula Dewitt's Pills Measurin Stanback  Argesic Dia-Gesic Meclofenamic Sulfinpyrazone  Arthritis Bayer Timed Release Aspirin Diclofenac Meclomen Sulindac  Arthritis pain formula Anacin Dicumarol Medipren Supac  Analgesic (Safety coated) Arthralgen Diffunasal Mefanamic Suprofen  Arthritis Strength Bufferin Dihydrocodeine Mepro Compound Suprol  Arthropan liquid Dopirydamole Methcarbomol with Aspirin Synalgos  ASA tablets/Enseals Disalcid Micrainin Tagament  Ascriptin Doan's Midol Talwin  Ascriptin A/D Dolene Mobidin Tanderil  Ascriptin Extra Strength Dolobid Moblgesic Ticlid  Ascriptin with Codeine Doloprin or Doloprin with Codeine Momentum Tolectin  Asperbuf Duoprin Mono-gesic Trendar  Aspergum Duradyne Motrin or Motrin IB Triminicin  Aspirin  plain, buffered or enteric coated Durasal Myochrisine Trigesic  Aspirin Suppositories Easprin Nalfon Trillsate  Aspirin with Codeine Ecotrin Regular or Extra Strength Naprosyn Uracel  Atromid-S Efficin Naproxen Ursinus  Auranofin Capsules Elmiron Neocylate Vanquish  Axotal Emagrin Norgesic Verin  Azathioprine Empirin or Empirin with Codeine Normiflo Vitamin E  Azolid Emprazil Nuprin Voltaren  Bayer Aspirin plain, buffered or children's or timed BC Tablets or powders Encaprin Orgaran Warfarin Sodium  Buff-a-Comp Enoxaparin Orudis Zorpin  Buff-a-Comp with Codeine Equegesic Os-Cal-Gesic   Buffaprin Excedrin plain, buffered or Extra Strength Oxalid   Bufferin Arthritis Strength Feldene Oxphenbutazone   Bufferin plain or Extra Strength Feldene Capsules Oxycodone with Aspirin   Bufferin with Codeine Fenoprofen Fenoprofen Pabalate or Pabalate-SF   Buffets II Flogesic Panagesic   Buffinol plain or Extra Strength Florinal or Florinal with Codeine Panwarfarin   Buf-Tabs Flurbiprofen Penicillamine   Butalbital Compound Four-way cold tablets Penicillin   Butazolidin Fragmin Pepto-Bismol   Carbenicillin Geminisyn Percodan   Carna Arthritis Reliever Geopen Persantine   Carprofen Gold's salt Persistin   Chloramphenicol Goody's Phenylbutazone   Chloromycetin Haltrain Piroxlcam   Clmetidine heparin Plaquenil   Cllnoril Hyco-pap Ponstel   Clofibrate Hydroxy chloroquine Propoxyphen         Before stopping any of these medications, be sure to consult the physician who ordered them.  Some, such as Coumadin (Warfarin) are ordered to prevent or treat serious conditions such as "deep thrombosis", "pumonary embolisms", and other heart problems.  The amount of time that you may need off of the medication may also vary with the medication and the reason for which you were taking it.  If you are taking any of these medications, please make sure you notify your pain physician before you undergo any  procedures.         Epidural Steroid Injection Patient Information  Description: The epidural space surrounds the nerves as they exit the spinal cord.  In some patients, the nerves can be compressed and inflamed by a bulging disc or a tight spinal canal (spinal stenosis).  By injecting steroids into the epidural space, we can bring irritated nerves into direct contact with a potentially helpful medication.  These steroids act directly on the irritated nerves and can reduce swelling and inflammation which often leads to decreased pain.  Epidural steroids may be injected anywhere along the spine and from the neck to the low back depending upon the location of your pain.   After numbing the skin with local anesthetic (like Novocaine), a small needle is passed   into the epidural space slowly.  You may experience a sensation of pressure while this is being done.  The entire block usually last less than 10 minutes.  Conditions which may be treated by epidural steroids:   Low back and leg pain  Neck and arm pain  Spinal stenosis  Post-laminectomy syndrome  Herpes zoster (shingles) pain  Pain from compression fractures  Preparation for the injection:  1. Do not eat any solid food or dairy products within 6 hours of your appointment.  2. You may drink clear liquids up to 2 hours before appointment.  Clear liquids include water, black coffee, juice or soda.  No milk or cream please. 3. You may take your regular medication, including pain medications, with a sip of water before your appointment  Diabetics should hold regular insulin (if taken separately) and take 1/2 normal NPH dos the morning of the procedure.  Carry some sugar containing items with you to your appointment. 4. A driver must accompany you and be prepared to drive you home after your procedure.  5. Bring all your current medications with your. 6. An IV may be inserted and sedation may be given at the discretion of the  physician.   7. A blood pressure cuff, EKG and other monitors will often be applied during the procedure.  Some patients may need to have extra oxygen administered for a short period. 8. You will be asked to provide medical information, including your allergies, prior to the procedure.  We must know immediately if you are taking blood thinners (like Coumadin/Warfarin)  Or if you are allergic to IV iodine contrast (dye). We must know if you could possible be pregnant.  Possible side-effects:  Bleeding from needle site  Infection (rare, may require surgery)  Nerve injury (rare)  Numbness & tingling (temporary)  Difficulty urinating (rare, temporary)  Spinal headache ( a headache worse with upright posture)  Light -headedness (temporary)  Pain at injection site (several days)  Decreased blood pressure (temporary)  Weakness in arm/leg (temporary)  Pressure sensation in back/neck (temporary)  Call if you experience:  Fever/chills associated with headache or increased back/neck pain.  Headache worsened by an upright position.  New onset weakness or numbness of an extremity below the injection site  Hives or difficulty breathing (go to the emergency room)  Inflammation or drainage at the infection site  Severe back/neck pain  Any new symptoms which are concerning to you  Please note:  Although the local anesthetic injected can often make your back or neck feel good for several hours after the injection, the pain will likely return.  It takes 3-7 days for steroids to work in the epidural space.  You may not notice any pain relief for at least that one week.  If effective, we will often do a series of three injections spaced 3-6 weeks apart to maximally decrease your pain.  After the initial series, we generally will wait several months before considering a repeat injection of the same type.  If you have any questions, please call [phone] [facility] 

## 2015-06-12 ENCOUNTER — Ambulatory Visit: Payer: Medicare Other | Attending: Pain Medicine | Admitting: Pain Medicine

## 2015-06-12 ENCOUNTER — Encounter: Payer: Self-pay | Admitting: Pain Medicine

## 2015-06-12 VITALS — BP 135/66 | HR 64 | Temp 98.2°F | Resp 16 | Ht 65.0 in | Wt 218.0 lb

## 2015-06-12 DIAGNOSIS — G8929 Other chronic pain: Secondary | ICD-10-CM | POA: Insufficient documentation

## 2015-06-12 DIAGNOSIS — M797 Fibromyalgia: Secondary | ICD-10-CM | POA: Diagnosis not present

## 2015-06-12 DIAGNOSIS — M4802 Spinal stenosis, cervical region: Secondary | ICD-10-CM | POA: Diagnosis not present

## 2015-06-12 DIAGNOSIS — M5416 Radiculopathy, lumbar region: Secondary | ICD-10-CM | POA: Diagnosis present

## 2015-06-12 DIAGNOSIS — E7800 Pure hypercholesterolemia, unspecified: Secondary | ICD-10-CM | POA: Diagnosis not present

## 2015-06-12 DIAGNOSIS — M961 Postlaminectomy syndrome, not elsewhere classified: Secondary | ICD-10-CM

## 2015-06-12 DIAGNOSIS — M47897 Other spondylosis, lumbosacral region: Secondary | ICD-10-CM | POA: Diagnosis not present

## 2015-06-12 DIAGNOSIS — E669 Obesity, unspecified: Secondary | ICD-10-CM | POA: Insufficient documentation

## 2015-06-12 DIAGNOSIS — E039 Hypothyroidism, unspecified: Secondary | ICD-10-CM | POA: Insufficient documentation

## 2015-06-12 DIAGNOSIS — M5126 Other intervertebral disc displacement, lumbar region: Secondary | ICD-10-CM | POA: Diagnosis not present

## 2015-06-12 DIAGNOSIS — M47892 Other spondylosis, cervical region: Secondary | ICD-10-CM | POA: Diagnosis not present

## 2015-06-12 DIAGNOSIS — M545 Low back pain: Secondary | ICD-10-CM | POA: Insufficient documentation

## 2015-06-12 DIAGNOSIS — Z5181 Encounter for therapeutic drug level monitoring: Secondary | ICD-10-CM | POA: Diagnosis not present

## 2015-06-12 DIAGNOSIS — Z79891 Long term (current) use of opiate analgesic: Secondary | ICD-10-CM

## 2015-06-12 DIAGNOSIS — Z6836 Body mass index (BMI) 36.0-36.9, adult: Secondary | ICD-10-CM | POA: Insufficient documentation

## 2015-06-12 DIAGNOSIS — M4726 Other spondylosis with radiculopathy, lumbar region: Secondary | ICD-10-CM

## 2015-06-12 DIAGNOSIS — M79606 Pain in leg, unspecified: Secondary | ICD-10-CM | POA: Insufficient documentation

## 2015-06-12 MED ORDER — ROPIVACAINE HCL 2 MG/ML IJ SOLN
INTRAMUSCULAR | Status: AC
  Filled 2015-06-12: qty 10

## 2015-06-12 MED ORDER — ROPIVACAINE HCL 2 MG/ML IJ SOLN
2.0000 mL | Freq: Once | INTRAMUSCULAR | Status: AC
  Administered 2015-06-12: 10:00:00 via EPIDURAL

## 2015-06-12 MED ORDER — ROPIVACAINE HCL 2 MG/ML IJ SOLN
1.0000 mL | Freq: Once | INTRAMUSCULAR | Status: AC
Start: 2015-06-12 — End: 2015-06-12
  Administered 2015-06-12: 10:00:00 via EPIDURAL

## 2015-06-12 MED ORDER — IOHEXOL 180 MG/ML  SOLN
10.0000 mL | Freq: Once | INTRAMUSCULAR | Status: AC | PRN
  Administered 2015-06-12: 5 mL via EPIDURAL

## 2015-06-12 MED ORDER — LIDOCAINE HCL (PF) 1 % IJ SOLN
10.0000 mL | Freq: Once | INTRAMUSCULAR | Status: AC
  Administered 2015-06-12: 10:00:00

## 2015-06-12 MED ORDER — SODIUM CHLORIDE 0.9 % IJ SOLN
2.0000 mL | Freq: Once | INTRAMUSCULAR | Status: AC
  Administered 2015-06-12: 10:00:00

## 2015-06-12 MED ORDER — DEXAMETHASONE SODIUM PHOSPHATE 10 MG/ML IJ SOLN
10.0000 mg | Freq: Once | INTRAMUSCULAR | Status: AC
  Administered 2015-06-12: 10:00:00

## 2015-06-12 MED ORDER — LIDOCAINE HCL (PF) 1 % IJ SOLN
INTRAMUSCULAR | Status: AC
  Filled 2015-06-12: qty 5

## 2015-06-12 MED ORDER — IOHEXOL 180 MG/ML  SOLN
INTRAMUSCULAR | Status: AC
  Administered 2015-06-12: 5 mL via EPIDURAL
  Filled 2015-06-12: qty 20

## 2015-06-12 MED ORDER — TRIAMCINOLONE ACETONIDE 40 MG/ML IJ SUSP
INTRAMUSCULAR | Status: AC
  Filled 2015-06-12: qty 1

## 2015-06-12 MED ORDER — TRIAMCINOLONE ACETONIDE 40 MG/ML IJ SUSP
40.0000 mg | Freq: Once | INTRAMUSCULAR | Status: AC
  Administered 2015-06-12: 10:00:00

## 2015-06-12 MED ORDER — DEXAMETHASONE SODIUM PHOSPHATE 10 MG/ML IJ SOLN
INTRAMUSCULAR | Status: AC
  Filled 2015-06-12: qty 1

## 2015-06-12 MED ORDER — SODIUM CHLORIDE 0.9 % IJ SOLN
INTRAMUSCULAR | Status: AC
  Filled 2015-06-12: qty 10

## 2015-06-12 NOTE — Progress Notes (Signed)
Patient's Name: Regina Campos MRN: [id number] DOB: 07-27-1951 DOS: 06/12/2015  Primary Reason(s) for Visit: Interventional Pain Management Treatment. CC: Back Pain   Pre-Procedure Assessment: Regina Campos is a 64 y.o. year old, female patient, seen today for interventional treatment. She has Encounter for therapeutic drug level monitoring; Long term current use of opiate analgesic; Uncomplicated opioid dependence (Regina Campos); Opiate use; Chronic neck pain; Chronic low back pain; Lumbar radicular pain; Lumbar spondylosis with radicular symptoms; Cervical spondylosis without myelopathy; Chronic pain syndrome; Disorder of shoulder; Pulmonary emphysema ([place]); Atypical migraine; Apnea, sleep; Displacement of lumbar intervertebral disc; High cholesterol; Hypothyroidism; History of cervical spinal surgery; Foraminal stenosis of cervical region; Arthropathy of shoulder region; Cervicogenic headache; Pain in joint involving right lower leg; Fibromyalgia; History of cardiac arrhythmia; Depression; Obesity; Failed back surgical syndrome; Spinal stenosis in cervical region; Chronic pain; and Long term prescription opiate use on her problem list.. Her primarily concern today is the Back Pain Verification of the correct person, correct site (including marking of site), and correct procedure were performed and confirmed by the patient. Today's Vitals   06/12/15 1014 06/12/15 1018 06/12/15 1022 06/12/15 1025  BP: 130/68 134/72 131/67 135/66  Pulse: 63 67 64 64  Temp:      TempSrc:      Resp: '13 16 21 16  ' Height:      Weight:      SpO2: 97% 98% 96% 97%  PainSc:    0-No pain  Calculated BMI: Body mass index is 36.28 kg/(m^2). Allergies: She has No Known Allergies.. Primary Diagnosis: Lumbar radicular pain [M54.16]  Procedure # 1: Type: Palliative Inter-Laminar Epidural Steroid Injection Region: Lumbar Level: L3-4 Level. Laterality: Right-Sided Paramedial  Indications: Spondylosis, Lumbosacral  Region  Procedure # 2: Type: Palliative Trans-Foraminal Epidural Steroid Injection Region: Lumbar Level: L4 Nerve Root Level(s) Laterality: Right-Sided Paraspinal  Indications: Chronic Pain Spondylosis, Lumbar Region Lumbosacral Radicular Pain Low Back Pain  Consent: Secured. Under the influence of no sedatives a written informed consent was obtained, after having provided information on the risks and possible complications. To fulfill our ethical and legal obligations, as recommended by the American Medical Association's Code of Ethics, we have provided information to the patient about our clinical impression; the nature and purpose of the treatment or procedure; the risks, benefits, and possible complications of the intervention; alternatives; the risk(s) and benefit(s) of the alternative treatment(s) or procedure(s); and the risk(s) and benefit(s) of doing nothing. The patient was provided information about the risks and possible complications associated with the procedure. In the case of spinal procedures these may include, but are not limited to, failure to achieve desired goals, infection, bleeding, organ or nerve damage, allergic reactions, paralysis, and death. In addition, the patient was informed that Medicine is not an exact science; therefore, there is also the possibility of unforeseen risks and possible complications that may result in a catastrophic outcome. The patient indicated having understood very clearly. We have given the patient no guarantees and we have made no promises. Enough time was given to the patient to ask questions, all of which were answered to the patient's satisfaction.  Pre-Procedure Preparation: Safety Precautions: Allergies reviewed. Appropriate site, procedure, and patient were confirmed by following the Joint Commission's Universal Protocol (UP.01.01.01), in the form of a "Time Out". The patient was asked to confirm marked site and procedure, before  commencing. The patient was asked about blood thinners, or active infections, both of which were denied. Patient was assessed for positional comfort and all pressure  points were checked before starting procedure. Monitoring:  As per clinic protocol. Infection Control Precautions: Sterile technique used. Standard Universal Precautions were taken as recommended by the Department of [facility] for Disease Control and Prevention (CDC). Standard pre-surgical skin prep was conducted. Respiratory hygiene and cough etiquette was practiced. Hand hygiene observed. Safe injection practices and needle disposal techniques followed. SDV (single dose vial) medications used. Medications properly checked for expiration dates and contaminants. Personal protective equipment (PPE) used: Surgical mask. Sterile double glove technique. Radiation resistant gloves. Sterile surgical gloves.  Anesthesia, Analgesia, Anxiolysis: Type: Local Anesthesia Local Anesthetic(s): Lidocaine 1% Route: Subcutaneous IV Access: Declined. Sedation: Declined. Indication(s):Not applicable.  Description of Procedure # 1 Process:  Time-out: "Time-out" completed before starting procedure, as per protocol. Position: Prone Target Area: For Epidural Steroid injections, the target area is the  interlaminar space, initially targeting the lower border of the superior vertebral body lamina. Approach: Posterior approach. Area Prepped: Entire Posterior Lumbosacral Region Prepping solution: ChloraPrep (2% chlorhexidine gluconate and 70% isopropyl alcohol) Safety Precautions: Aspiration looking for blood return was conducted prior to all injections. At no point did we inject any substances, as a needle was being advanced. No attempts were made at seeking any paresthesias. Safe injection practices and needle disposal techniques used. Medications properly checked for expiration dates. SDV (single dose vial) medications used. Description of the  Procedure: Protocol guidelines were followed. The patient was placed in position over the fluoroscopy table. The target area was identified and the area prepped in the usual manner. Skin desensitized using vapocoolant spray. Skin & deeper tissues infiltrated with local anesthetic. Appropriate amount of time allowed to pass for local anesthetics to take effect. The procedure needle was introduced through the skin, ipsilateral to the reported pain, and advanced to the target area. Bone was contacted and the needle walked caudad, until the lamina was cleared. The epidural space was identified using "loss-of-resistance technique" with 2-3 ml of PF-NaCl (0.9% NSS), in a 5cc LOR glass syringe. Proper needle placement secured. Negative aspiration confirmed. Solution injected in intermittent fashion, asking for systemic symptoms every 0.5cc of injectate. The needles were then removed and the area cleansed, making sure to leave some of the prepping solution back to take advantage of its long term bactericidal properties. EBL: None Materials & Medications Used:  Needle(s) Used: 20g - 10cm, Tuohy-style epidural needle Medications Administered today: We administered ropivacaine (PF) 2 mg/ml (0.2%), iohexol, lidocaine (PF), sodium chloride, dexamethasone, ropivacaine (PF) 2 mg/ml (0.2%), lidocaine (PF), dexamethasone, iohexol, lidocaine (PF), sodium chloride, ropivacaine (PF) 2 mg/ml (0.2%), triamcinolone acetonide, ropivacaine (PF) 2 mg/ml (0.2%), triamcinolone acetonide, and lidocaine (PF).Please see chart orders for dosing details.  Description of Procedure # 2 Process:  Time-out: "Time-out" completed before starting procedure, as per protocol. Position: Prone Target Area: Right L4-5 neural foramina Approach: Paramedial approach. Area Prepped: Entire Lower Lumbosacral Area Prepping solution: ChloraPrep (2% chlorhexidine gluconate and 70% isopropyl alcohol) Safety Precautions: Aspiration looking for blood return  was conducted prior to all injections. At no point did we inject any substances, as a needle was being advanced. No attempts were made at seeking any paresthesias. Safe injection practices and needle disposal techniques used. Medications properly checked for expiration dates. SDV (single dose vial) medications used. Description of the Procedure: Area presented a challenge since the patient has hardware from both sides of the lumbar spine. She has pedicle screws. An opening was observed right under the transverse processes of L5. Skin was infiltrated using 1% lidocaine. A 5  inch, 22-gauge Quincke spinal needle was then introduced until the posterior aspect of the transverse process was contacted. The needle was then walked caudad until he cleared the edge. The needle was then advanced on the lateral fluoroscopic view and once the tip past the anterior aspect of the lamina, contrast was injected. Flow of contrast demonstrated no intravascular uptake and spread of contrast around the nerve root. At this point a solution was injected in standard fashion. No paresthesias.  Imaging Guidance:  Type of Imaging Technique: Fluoroscopy Guidance (Spinal) Indication(s): Assistance in needle guidance and placement for procedures requiring needle placement in or near specific anatomical locations not easily accessible without such assistance. Exposure Time: Please see nurses notes. Contrast: Before injecting any contrast, we confirmed that the patient did not have an allergy to iodine, shellfish, or radiological contrast. Once satisfactory needle placement was completed at the desired level, radiological contrast was injected. Injection was conducted under continuous fluoroscopic guidance. Injection of contrast accomplished without complications. See chart for type and volume of contrast used. Fluoroscopic Guidance: I was personally present in the fluoroscopy suite, where the patient was placed in position for the  procedure, over the fluoroscopy-compatible table. Fluoroscopy was manipulated, using "Tunnel Vision Technique", to obtain the best possible view of the target area, on the affected side. Parallax error was corrected before commencing the procedure. A "direction-depth-direction" technique was used to introduce the needle under continuous pulsed fluoroscopic guidance. Once the target was reached, antero-posterior, oblique, and lateral fluoroscopic projection views were taken to confirm needle placement in all planes. Permanently recorded images stored by scanning into EMR. Interpretation: Intraoperative imaging interpretation by performing Physician. Adequate needle placement confirmed. Adequate needle placement confirmed in AP, lateral, & Oblique Views. Appropriate spread of contrast to desired area. No evidence of afferent or efferent intravascular uptake. No intrathecal or subarachnoid spread observed.  Antibiotics:  Type:  Antibiotics Given (last 72 hours)    None      Indication(s): No indications identified.  Post-operative Assessment:  Complications: No immediate post-treatment complications were observed. Relevant Post-operative Information:  Disposition: Return to clinic for follow-up evaluation. The patient tolerated the entire procedure well. A repeat set of vitals were taken after the procedure and the patient was kept under observation following institutional policy, for this procedure. Post-procedural neurological assessment was performed, showing return to baseline, prior to discharge. The patient was discharged home, once institutional criteria were met. The patient was provided with post-procedure discharge instructions, including a section on how to identify potential problems. Should any problems arise concerning this procedure, the patient was given instructions to immediately contact us, at any time, without hesitation. In any case, we plan to contact the patient by telephone for a  follow-up status report regarding this interventional procedure. Comments:  No additional relevant information.  Primary Care Physician: No primary care provider on file. Location: [facility] Note by: Zyra Parrillo A. Dossie Arbour, M.D, DABA, DABAPM, DABPM, DABIPP, FIPP  Disclaimer:  Medicine is not an exact science. The only guarantee in medicine is that nothing is guaranteed. It is important to note that the decision to proceed with this intervention was based on the information collected from the patient. The Data and conclusions were drawn from the patient's questionnaire, the interview, and the physical examination. Because the information was provided in large part by the patient, it cannot be guaranteed that it has not been purposely or unconsciously manipulated. Every effort has been made to obtain as much relevant data as possible  for this evaluation. It is important to note that the conclusions that lead to this procedure are derived in large part from the available data. Always take into account that the treatment will also be dependent on availability of resources and existing treatment guidelines, considered by other Pain Management Practitioners as being common knowledge and practice, at the time of the intervention. For Medico-Legal purposes, it is also important to point out that variation in procedural techniques and pharmacological choices are the acceptable norm. The indications, contraindications, technique, and results of the above procedure should only be interpreted and judged by a Board-Certified Interventional Pain Specialist with extensive familiarity and expertise in the same exact procedure and technique. Attempts at providing opinions without similar or greater experience and expertise than that of the treating physician will be considered as inappropriate and unethical, and shall result in a formal complaint to the state medical board and applicable  specialty societies.

## 2015-06-12 NOTE — Patient Instructions (Signed)
Selective Nerve Root Block Patient Information  Description: Specific nerve roots exit the spinal canal and these nerves can be compressed and inflamed by a bulging disc and bone spurs.  By injecting steroids on the nerve root, we can potentially decrease the inflammation surrounding these nerves, which often leads to decreased pain.  Also, by injecting local anesthesia on the nerve root, this can provide [place] helpful information to give to your referring doctor if it decreases your pain.  Selective nerve root blocks can be done along the spine from the neck to the low back depending on the location of your pain.   After numbing the skin with local anesthesia, a small needle is passed to the nerve root and the position of the needle is verified using x-ray pictures.  After the needle is in correct position, we then deposit the medication.  You may experience a pressure sensation while this is being done.  The entire block usually lasts less than 15 minutes.  Conditions that may be treated with selective nerve root blocks:  Low back and leg pain  Spinal stenosis  Diagnostic block prior to potential surgery  Neck and arm pain  Post laminectomy syndrome  Preparation for the injection:  1. Do not eat any solid food or dairy products within 6 hours of your appointment. 2. You may drink clear liquids up to 2 hours before an appointment.  Clear liquids include water, black coffee, juice or soda.  No milk or cream please. 3. You may take your regular medications, including pain medications, with a sip of water before your appointment.  Diabetics should hold regular insulin (if taken separately) and take 1/2 normal NPH dose the morning of the procedure.  Carry some sugar containing items with you to your appointment. 4. A driver must accompany you and be prepared to drive you home after your procedure. 5. Bring all your current medications with you. 6. An IV may be inserted and sedation may be given at  the discretion of the physician. 7. A blood pressure cuff, EKG, and other monitors will often be applied during the procedure.  Some patients may need to have extra oxygen administered for a short period. 8. You will be asked to provide medical information, including allergies, prior to the procedure.  We must know immediately if you are taking blood  Thinners (like Coumadin) or if you are allergic to IV iodine contrast (dye).  Possible side-effects: All are usually temporary  Bleeding from needle site  Light headedness  Numbness and tingling  Decreased blood pressure  Weakness in arms/legs  Pressure sensation in back/neck  Pain at injection site (several days)  Possible complications: All are extremely rare  Infection  Nerve injury  Spinal headache (a headache wore with upright position)  Call if you experience:  Fever/chills associated with headache or increased back/neck pain  Headache worsened by an upright position  New onset weakness or numbness of an extremity below the injection site  Hives or difficulty breathing (go to the emergency room)  Inflammation or drainage at the injection site(s)  Severe back/neck pain greater than usual  New symptoms which are concerning to you  Please note:  Although the local anesthetic injected can often make your back or neck feel good for several hours after the injection the pain will likely return.  It takes 3-5 days for steroids to work on the nerve root. You may not notice any pain relief for at least one week.  If effective,  we will often do a series of 3 injections spaced 3-6 weeks apart to maximally decrease your pain.    If you have any questions, please call [phone] [facility] Patient Information  Description: The epidural space surrounds the nerves as they exit the spinal cord.  In some patients, the nerves can be compressed and inflamed by a  bulging disc or a tight spinal canal (spinal stenosis).  By injecting steroids into the epidural space, we can bring irritated nerves into direct contact with a potentially helpful medication.  These steroids act directly on the irritated nerves and can reduce swelling and inflammation which often leads to decreased pain.  Epidural steroids may be injected anywhere along the spine and from the neck to the low back depending upon the location of your pain.   After numbing the skin with local anesthetic (like Novocaine), a small needle is passed into the epidural space slowly.  You may experience a sensation of pressure while this is being done.  The entire block usually last less than 10 minutes.  Conditions which may be treated by epidural steroids:   Low back and leg pain  Neck and arm pain  Spinal stenosis  Post-laminectomy syndrome  Herpes zoster (shingles) pain  Pain from compression fractures  Preparation for the injection:  9. Do not eat any solid food or dairy products within 6 hours of your appointment.  10. You may drink clear liquids up to 2 hours before appointment.  Clear liquids include water, black coffee, juice or soda.  No milk or cream please. 11. You may take your regular medication, including pain medications, with a sip of water before your appointment  Diabetics should hold regular insulin (if taken separately) and take 1/2 normal NPH dos the morning of the procedure.  Carry some sugar containing items with you to your appointment. 12. A driver must accompany you and be prepared to drive you home after your procedure.  71. Bring all your current medications with your. 14. An IV may be inserted and sedation may be given at the discretion of the physician.   15. A blood pressure cuff, EKG and other monitors will often be applied during the procedure.  Some patients may need to have extra oxygen administered for a short period. 4. You will be asked to provide medical  information, including your allergies, prior to the procedure.  We must know immediately if you are taking blood thinners (like Coumadin/Warfarin)  Or if you are allergic to IV iodine contrast (dye). We must know if you could possible be pregnant.  Possible side-effects:  Bleeding from needle site  Infection (rare, may require surgery)  Nerve injury (rare)  Numbness & tingling (temporary)  Difficulty urinating (rare, temporary)  Spinal headache ( a headache worse with upright posture)  Light -headedness (temporary)  Pain at injection site (several days)  Decreased blood pressure (temporary)  Weakness in arm/leg (temporary)  Pressure sensation in back/neck (temporary)  Call if you experience:  Fever/chills associated with headache or increased back/neck pain.  Headache worsened by an upright position.  New onset weakness or numbness of an extremity below the injection site  Hives or difficulty breathing (go to the emergency room)  Inflammation or drainage at the infection site  Severe back/neck pain  Any new symptoms which are concerning to you  Please note:  Although the local anesthetic injected can often make your back or neck feel good for several hours after  the injection, the pain will likely return.  It takes 3-7 days for steroids to work in the epidural space.  You may not notice any pain relief for at least that one week.  If effective, we will often do a series of three injections spaced 3-6 weeks apart to maximally decrease your pain.  After the initial series, we generally will wait several months before considering a repeat injection of the same type.  If you have any questions, please call [phone] [facility] Clinic[facility] Discharge Instructions  General Discharge Instructions :  If you need to reach your doctor call: Monday-Friday 8:00 am - 4:00 pm at [phone] or toll free [phone].  After clinic  hours [phone] to have operator reach doctor.  Bring all of your medication bottles to all your appointments in the pain clinic.  To cancel or reschedule your appointment with Pain Management please remember to call 24 hours in advance to avoid a fee.  Refer to the educational materials which you have been given on: General Risks, I had my Procedure. Discharge Instructions, Post Sedation.  Post Procedure Instructions:  The drugs you were given will stay in your system until tomorrow, so for the next 24 hours you should not drive, make any legal decisions or drink any alcoholic beverages.  You may eat anything you prefer, but it is better to start with liquids then soups and crackers, and gradually work up to solid foods.  Please notify your doctor immediately if you have any unusual bleeding, trouble breathing or pain that is not related to your normal pain.  Depending on the type of procedure that was done, some parts of your body may feel week and/or numb.  This usually clears up by tonight or the next day.  Walk with the use of an assistive device or accompanied by an adult for the 24 hours.  You may use ice on the affected area for the first 24 hours.  Put ice in a Ziploc bag and cover with a towel and place against area 15 minutes on 15 minutes off.  You may switch to heat after 24 hours.

## 2015-06-12 NOTE — Progress Notes (Signed)
Safety precautions to be maintained throughout the outpatient stay will include: orient to surroundings, keep bed in low position, maintain call bell within reach at all times, provide assistance with transfer out of bed and ambulation.  

## 2015-06-13 ENCOUNTER — Telehealth: Payer: Self-pay | Admitting: *Deleted

## 2015-06-13 NOTE — Telephone Encounter (Signed)
Denies complications post procedure. 

## 2015-06-18 ENCOUNTER — Telehealth: Payer: Self-pay | Admitting: Pain Medicine

## 2015-06-18 NOTE — Telephone Encounter (Signed)
Mailed scripts in to pharmacy and pharmacy has not gotten them/ almost out of meds

## 2015-06-19 NOTE — Telephone Encounter (Signed)
Dr. Dossie Arbour informed. No new prescription wil be written, per medication agreement. Patient notified.

## 2015-06-19 NOTE — Telephone Encounter (Signed)
States Tramadol did not get to the mail in pharmacy. Not sure if it was mailed. Wants another prescription written.

## 2015-06-25 ENCOUNTER — Other Ambulatory Visit: Payer: Self-pay | Admitting: Pain Medicine

## 2015-07-02 ENCOUNTER — Ambulatory Visit: Payer: Medicare Other | Attending: Pain Medicine | Admitting: Pain Medicine

## 2015-07-02 ENCOUNTER — Encounter: Payer: Self-pay | Admitting: Pain Medicine

## 2015-07-02 VITALS — BP 155/62 | HR 67 | Temp 98.6°F | Resp 18 | Ht 65.0 in | Wt 220.0 lb

## 2015-07-02 DIAGNOSIS — E7800 Pure hypercholesterolemia, unspecified: Secondary | ICD-10-CM | POA: Insufficient documentation

## 2015-07-02 DIAGNOSIS — E039 Hypothyroidism, unspecified: Secondary | ICD-10-CM | POA: Diagnosis not present

## 2015-07-02 DIAGNOSIS — M5126 Other intervertebral disc displacement, lumbar region: Secondary | ICD-10-CM | POA: Diagnosis not present

## 2015-07-02 DIAGNOSIS — M4802 Spinal stenosis, cervical region: Secondary | ICD-10-CM | POA: Diagnosis not present

## 2015-07-02 DIAGNOSIS — M62838 Other muscle spasm: Secondary | ICD-10-CM | POA: Insufficient documentation

## 2015-07-02 DIAGNOSIS — F1190 Opioid use, unspecified, uncomplicated: Secondary | ICD-10-CM | POA: Diagnosis not present

## 2015-07-02 DIAGNOSIS — M549 Dorsalgia, unspecified: Secondary | ICD-10-CM | POA: Insufficient documentation

## 2015-07-02 DIAGNOSIS — G8929 Other chronic pain: Secondary | ICD-10-CM | POA: Diagnosis not present

## 2015-07-02 DIAGNOSIS — M542 Cervicalgia: Secondary | ICD-10-CM | POA: Insufficient documentation

## 2015-07-02 DIAGNOSIS — G894 Chronic pain syndrome: Secondary | ICD-10-CM

## 2015-07-02 DIAGNOSIS — M47812 Spondylosis without myelopathy or radiculopathy, cervical region: Secondary | ICD-10-CM | POA: Insufficient documentation

## 2015-07-02 DIAGNOSIS — Z9889 Other specified postprocedural states: Secondary | ICD-10-CM

## 2015-07-02 DIAGNOSIS — M6248 Contracture of muscle, other site: Secondary | ICD-10-CM

## 2015-07-02 DIAGNOSIS — G43909 Migraine, unspecified, not intractable, without status migrainosus: Secondary | ICD-10-CM | POA: Insufficient documentation

## 2015-07-02 DIAGNOSIS — J439 Emphysema, unspecified: Secondary | ICD-10-CM | POA: Insufficient documentation

## 2015-07-02 DIAGNOSIS — Z96619 Presence of unspecified artificial shoulder joint: Secondary | ICD-10-CM | POA: Insufficient documentation

## 2015-07-02 DIAGNOSIS — M791 Myalgia: Secondary | ICD-10-CM

## 2015-07-02 DIAGNOSIS — M7918 Myalgia, other site: Secondary | ICD-10-CM | POA: Insufficient documentation

## 2015-07-02 MED ORDER — TIZANIDINE HCL 4 MG PO CAPS: 4.0000 mg | ORAL_CAPSULE | Freq: Three times a day (TID) | ORAL | Status: DC | PRN

## 2015-07-02 NOTE — Patient Instructions (Signed)
GENERAL RISKS AND COMPLICATIONS  What are the risk, side effects and possible complications? Generally speaking, most procedures are safe.  However, with any procedure there are risks, side effects, and the possibility of complications.  The risks and complications are dependent upon the sites that are lesioned, or the type of nerve block to be performed.  The closer the procedure is to the spine, the more serious the risks are.  Great care is taken when placing the radio frequency needles, block needles or lesioning probes, but sometimes complications can occur. 1. Infection: Any time there is an injection through the skin, there is a risk of infection.  This is why sterile conditions are used for these blocks.  There are four possible types of infection. 1. Localized skin infection. 2. Central Nervous System Infection-This can be in the form of Meningitis, which can be deadly. 3. Epidural Infections-This can be in the form of an epidural abscess, which can cause pressure inside of the spine, causing compression of the spinal cord with subsequent paralysis. This would require an emergency surgery to decompress, and there are no guarantees that the patient would recover from the paralysis. 4. Discitis-This is an infection of the intervertebral discs.  It occurs in about 1% of discography procedures.  It is difficult to treat and it may lead to surgery.        2. Pain: the needles have to go through skin and soft tissues, will cause soreness.       3. Damage to internal structures:  The nerves to be lesioned may be near blood vessels or    other nerves which can be potentially damaged.       4. Bleeding: Bleeding is more common if the patient is taking blood thinners such as  aspirin, Coumadin, Ticiid, Plavix, etc., or if he/she have some genetic predisposition  such as hemophilia. Bleeding into the spinal canal can cause compression of the spinal  cord with subsequent paralysis.  This would require an  emergency surgery to  decompress and there are no guarantees that the patient would recover from the  paralysis.       5. Pneumothorax:  Puncturing of a lung is a possibility, every time a needle is introduced in  the area of the chest or upper back.  Pneumothorax refers to free air around the  collapsed lung(s), inside of the thoracic cavity (chest cavity).  Another two possible  complications related to a similar event would include: Hemothorax and Chylothorax.   These are variations of the Pneumothorax, where instead of air around the collapsed  lung(s), you may have blood or chyle, respectively.       6. Spinal headaches: They may occur with any procedures in the area of the spine.       7. Persistent CSF (Cerebro-Spinal Fluid) leakage: This is a rare problem, but may occur  with prolonged intrathecal or epidural catheters either due to the formation of a fistulous  track or a dural tear.       8. Nerve damage: By working so close to the spinal cord, there is always a possibility of  nerve damage, which could be as serious as a permanent spinal cord injury with  paralysis.       9. Death:  Although rare, severe deadly allergic reactions known as "Anaphylactic  reaction" can occur to any of the medications used.      10. Worsening of the symptoms:  We can always make thing worse.    What are the chances of something like this happening? Chances of any of this occuring are extremely low.  By statistics, you have more of a chance of getting killed in a motor vehicle accident: while driving to the hospital than any of the above occurring .  Nevertheless, you should be aware that they are possibilities.  In general, it is similar to taking a shower.  Everybody knows that you can slip, hit your head and get killed.  Does that mean that you should not shower again?  Nevertheless always keep in mind that statistics do not mean anything if you happen to be on the wrong side of them.  Even if a procedure has a 1  (one) in a 1,000,000 (million) chance of going wrong, it you happen to be that one..Also, keep in mind that by statistics, you have more of a chance of having something go wrong when taking medications.  Who should not have this procedure? If you are on a blood thinning medication (e.g. Coumadin, Plavix, see list of "Blood Thinners"), or if you have an active infection going on, you should not have the procedure.  If you are taking any blood thinners, please inform your physician.  How should I prepare for this procedure?  Do not eat or drink anything at least six hours prior to the procedure.  Bring a driver with you .  It cannot be a taxi.  Come accompanied by an adult that can drive you back, and that is strong enough to help you if your legs get weak or numb from the local anesthetic.  Take all of your medicines the morning of the procedure with just enough water to swallow them.  If you have diabetes, make sure that you are scheduled to have your procedure done first thing in the morning, whenever possible.  If you have diabetes, take only half of your insulin dose and notify our nurse that you have done so as soon as you arrive at the clinic.  If you are diabetic, but only take blood sugar pills (oral hypoglycemic), then do not take them on the morning of your procedure.  You may take them after you have had the procedure.  Do not take aspirin or any aspirin-containing medications, at least eleven (11) days prior to the procedure.  They may prolong bleeding.  Wear loose fitting clothing that may be easy to take off and that you would not mind if it got stained with Betadine or blood.  Do not wear any jewelry or perfume  Remove any nail coloring.  It will interfere with some of our monitoring equipment.  NOTE: Remember that this is not meant to be interpreted as a complete list of all possible complications.  Unforeseen problems may occur.  BLOOD THINNERS The following drugs  contain aspirin or other products, which can cause increased bleeding during surgery and should not be taken for 2 weeks prior to and 1 week after surgery.  If you should need take something for relief of minor pain, you may take acetaminophen which is found in Tylenol,m Datril, Anacin-3 and Panadol. It is not blood thinner. The products listed below are.  Do not take any of the products listed below in addition to any listed on your instruction sheet.  A.P.C or A.P.C with Codeine Codeine Phosphate Capsules #3 Ibuprofen Ridaura  ABC compound Congesprin Imuran rimadil  Advil Cope Indocin Robaxisal  Alka-Seltzer Effervescent Pain Reliever and Antacid Coricidin or Coricidin-D  Indomethacin Rufen    Alka-Seltzer plus Cold Medicine Cosprin Ketoprofen S-A-C Tablets  Anacin Analgesic Tablets or Capsules Coumadin Korlgesic Salflex  Anacin Extra Strength Analgesic tablets or capsules CP-2 Tablets Lanoril Salicylate  Anaprox Cuprimine Capsules Levenox Salocol  Anexsia-D Dalteparin Magan Salsalate  Anodynos Darvon compound Magnesium Salicylate Sine-off  Ansaid Dasin Capsules Magsal Sodium Salicylate  Anturane Depen Capsules Marnal Soma  APF Arthritis pain formula Dewitt's Pills Measurin Stanback  Argesic Dia-Gesic Meclofenamic Sulfinpyrazone  Arthritis Bayer Timed Release Aspirin Diclofenac Meclomen Sulindac  Arthritis pain formula Anacin Dicumarol Medipren Supac  Analgesic (Safety coated) Arthralgen Diffunasal Mefanamic Suprofen  Arthritis Strength Bufferin Dihydrocodeine Mepro Compound Suprol  Arthropan liquid Dopirydamole Methcarbomol with Aspirin Synalgos  ASA tablets/Enseals Disalcid Micrainin Tagament  Ascriptin Doan's Midol Talwin  Ascriptin A/D Dolene Mobidin Tanderil  Ascriptin Extra Strength Dolobid Moblgesic Ticlid  Ascriptin with Codeine Doloprin or Doloprin with Codeine Momentum Tolectin  Asperbuf Duoprin Mono-gesic Trendar  Aspergum Duradyne Motrin or Motrin IB Triminicin  Aspirin  plain, buffered or enteric coated Durasal Myochrisine Trigesic  Aspirin Suppositories Easprin Nalfon Trillsate  Aspirin with Codeine Ecotrin Regular or Extra Strength Naprosyn Uracel  Atromid-S Efficin Naproxen Ursinus  Auranofin Capsules Elmiron Neocylate Vanquish  Axotal Emagrin Norgesic Verin  Azathioprine Empirin or Empirin with Codeine Normiflo Vitamin E  Azolid Emprazil Nuprin Voltaren  Bayer Aspirin plain, buffered or children's or timed BC Tablets or powders Encaprin Orgaran Warfarin Sodium  Buff-a-Comp Enoxaparin Orudis Zorpin  Buff-a-Comp with Codeine Equegesic Os-Cal-Gesic   Buffaprin Excedrin plain, buffered or Extra Strength Oxalid   Bufferin Arthritis Strength Feldene Oxphenbutazone   Bufferin plain or Extra Strength Feldene Capsules Oxycodone with Aspirin   Bufferin with Codeine Fenoprofen Fenoprofen Pabalate or Pabalate-SF   Buffets II Flogesic Panagesic   Buffinol plain or Extra Strength Florinal or Florinal with Codeine Panwarfarin   Buf-Tabs Flurbiprofen Penicillamine   Butalbital Compound Four-way cold tablets Penicillin   Butazolidin Fragmin Pepto-Bismol   Carbenicillin Geminisyn Percodan   Carna Arthritis Reliever Geopen Persantine   Carprofen Gold's salt Persistin   Chloramphenicol Goody's Phenylbutazone   Chloromycetin Haltrain Piroxlcam   Clmetidine heparin Plaquenil   Cllnoril Hyco-pap Ponstel   Clofibrate Hydroxy chloroquine Propoxyphen         Before stopping any of these medications, be sure to consult the physician who ordered them.  Some, such as Coumadin (Warfarin) are ordered to prevent or treat serious conditions such as "deep thrombosis", "pumonary embolisms", and other heart problems.  The amount of time that you may need off of the medication may also vary with the medication and the reason for which you were taking it.  If you are taking any of these medications, please make sure you notify your pain physician before you undergo any  procedures.         Epidural Steroid Injection Patient Information  Description: The epidural space surrounds the nerves as they exit the spinal cord.  In some patients, the nerves can be compressed and inflamed by a bulging disc or a tight spinal canal (spinal stenosis).  By injecting steroids into the epidural space, we can bring irritated nerves into direct contact with a potentially helpful medication.  These steroids act directly on the irritated nerves and can reduce swelling and inflammation which often leads to decreased pain.  Epidural steroids may be injected anywhere along the spine and from the neck to the low back depending upon the location of your pain.   After numbing the skin with local anesthetic (like Novocaine), a small needle is passed   into the epidural space slowly.  You may experience a sensation of pressure while this is being done.  The entire block usually last less than 10 minutes.  Conditions which may be treated by epidural steroids:   Low back and leg pain  Neck and arm pain  Spinal stenosis  Post-laminectomy syndrome  Herpes zoster (shingles) pain  Pain from compression fractures  Preparation for the injection:  1. Do not eat any solid food or dairy products within 6 hours of your appointment.  2. You may drink clear liquids up to 2 hours before appointment.  Clear liquids include water, black coffee, juice or soda.  No milk or cream please. 3. You may take your regular medication, including pain medications, with a sip of water before your appointment  Diabetics should hold regular insulin (if taken separately) and take 1/2 normal NPH dos the morning of the procedure.  Carry some sugar containing items with you to your appointment. 4. A driver must accompany you and be prepared to drive you home after your procedure.  5. Bring all your current medications with your. 6. An IV may be inserted and sedation may be given at the discretion of the  physician.   7. A blood pressure cuff, EKG and other monitors will often be applied during the procedure.  Some patients may need to have extra oxygen administered for a short period. 8. You will be asked to provide medical information, including your allergies, prior to the procedure.  We must know immediately if you are taking blood thinners (like Coumadin/Warfarin)  Or if you are allergic to IV iodine contrast (dye). We must know if you could possible be pregnant.  Possible side-effects:  Bleeding from needle site  Infection (rare, may require surgery)  Nerve injury (rare)  Numbness & tingling (temporary)  Difficulty urinating (rare, temporary)  Spinal headache ( a headache worse with upright posture)  Light -headedness (temporary)  Pain at injection site (several days)  Decreased blood pressure (temporary)  Weakness in arm/leg (temporary)  Pressure sensation in back/neck (temporary)  Call if you experience:  Fever/chills associated with headache or increased back/neck pain.  Headache worsened by an upright position.  New onset weakness or numbness of an extremity below the injection site  Hives or difficulty breathing (go to the emergency room)  Inflammation or drainage at the infection site  Severe back/neck pain  Any new symptoms which are concerning to you  Please note:  Although the local anesthetic injected can often make your back or neck feel good for several hours after the injection, the pain will likely return.  It takes 3-7 days for steroids to work in the epidural space.  You may not notice any pain relief for at least that one week.  If effective, we will often do a series of three injections spaced 3-6 weeks apart to maximally decrease your pain.  After the initial series, we generally will wait several months before considering a repeat injection of the same type.  If you have any questions, please call [phone]  [facility] 

## 2015-07-02 NOTE — Progress Notes (Signed)
Safety precautions to be maintained throughout the outpatient stay will include: orient to surroundings, keep bed in low position, maintain call bell within reach at all times, provide assistance with transfer out of bed and ambulation.  

## 2015-07-02 NOTE — Progress Notes (Signed)
Patient's Name: Regina Campos MRN: [id number] DOB: Feb 26, 1951 DOS: 07/02/2015  Primary Reason(s) for Visit: Post-Procedure evaluation. CC: Back Pain   HPI:   Regina Campos is a 64 y.o. year old, female patient, who returns today as an established patient. She has Encounter for therapeutic drug level monitoring; Long term current use of opiate analgesic; Uncomplicated opioid dependence ([place]); Opiate use; Chronic neck pain; Chronic low back pain; Lumbar radicular pain; Lumbar spondylosis with radicular symptoms; Cervical spondylosis without myelopathy; Chronic pain syndrome; Disorder of shoulder; Pulmonary emphysema ([place]); Atypical migraine; Apnea, sleep; Displacement of lumbar intervertebral disc; High cholesterol; Hypothyroidism; History of cervical spinal surgery; Foraminal stenosis of cervical region; Arthropathy of shoulder region; Cervicogenic headache; Pain in joint involving right lower leg; Fibromyalgia; History of cardiac arrhythmia; Depression; Obesity; Failed back surgical syndrome; Spinal stenosis in cervical region; Chronic pain; Long term prescription opiate use; Myofascial pain; and Muscle spasms of head or neck on her problem list.. Her primarily concern today is the Back Pain    The patient returns today after having had a right L3-4 lumbar epidural steroid injection plus a right L4 transforaminal epidural steroid injection under fluoroscopic guidance. He refers that her low back is doing much better, unfortunately, she is having problems with her neck. She has been experiencing cervicogenic headaches. Apparently there was also an issue with her medications were her daughter misplaced the prescription and she has been out of medicine for some time now. Apparently they were sending the prescriptions out for a mail pharmacy service and something happened with the prescription. Today's Pain Score: 3  Reported level of pain is compatible with clinical observation Pain Type: Chronic  pain Pain Location: Back Pain Orientation: Lower Pain Descriptors / Indicators: Aching, Constant, Dull Pain Frequency: Constant  Date of Last Visit: Date of Last Visit: 06/12/15 Service Provided on Last Visit: Service Provided on Last Visit: Procedure ([facility])  Pharmacotherapy Review:   Side-effects or Adverse reactions: None reported. Effectiveness: Described as relatively effective, allowing for increase in activities of daily living (ADL). Onset of action: Within expected pharmacological parameters. Duration of action: Within normal limits for medication. Peak effect: Timing and results are as within normal expected parameters. [place] PMP: Compliant with practice rules and regulations. UDS: Compliant with practice rules and regulations. Lab work: No new labs ordered by our practice. Treatment compliance: Compliant. Substance Use Disorder (SUD) Risk Level: Low Planned course of action: Because the daughter misplaced her prescription and she is currently not taking anything, I have agreed to let her try some Zanaflex to see back chance this can help with her pain until she can get her prescription back.  Post-Procedure Assessment:  Procedure done on last visit: Right L3-4 lumbar epidural steroid injection plus right L4 transforaminal epidural steroid injection. Side-effects or Adverse reactions: No significant issues reported. Sedation: No sedation used during procedure.  Results: Ultra-Short Term Relief (First 1 hour after procedure): 25 % Short Term Relief (Initial 4-6 hrs after procedure): 50 % Long Term Relief : 80 % (ongoing)  Current Relief (Now):  80% Interpretation of Results: Long term relief is possibly due to sympathetic blockade, or the effects of steroids, if administered during procedure. Persistent relief would suggest effective anti-inflammatory effects from steroids. No short or long term benefit would suggest etiology of pain to be in a different location. I am surprised  the patient did not get 100% relief of the low back pain with the local anesthetic. This would suggest, that although it seems that this pain  is inflammatory in nature, the source is not directly where we injected.  Allergies: Ms. Moriel has No Known Allergies.  Meds: The patient has a current medication list which includes the following prescription(s): albuterol, budesonide-formoterol, fenofibrate, levothyroxine, lubiprostone, meloxicam, polyethylene glycol powder, rosuvastatin, sumatriptan, tramadol, and tizanidine. Requested Prescriptions   Signed Prescriptions Disp Refills  . tiZANidine (ZANAFLEX) 4 MG capsule 90 capsule 2    Sig: Take 1 capsule (4 mg total) by mouth 3 (three) times daily as needed for muscle spasms.    ROS: Constitutional: Afebrile, no chills, well hydrated and well nourished Gastrointestinal: negative Musculoskeletal:negative Neurological: negative Behavioral/Psych: negative  PFSH: Medical:  Regina Campos  has a past medical history of Hyperlipidemia; Arthritis; Thyroid disease; Hypothyroidism (06/04/2015); History of cervical spinal surgery (06/04/2015); and History of cardiac arrhythmia (06/04/2015). Family: family history includes Cancer in her mother; Heart disease in her father. Surgical:  has past surgical history that includes Abdominal hysterectomy; Total shoulder replacement; Replacement total knee; Appendectomy; Lumbar fusion; and Carpal tunnel release. Tobacco:  reports that she has never smoked. She does not have any smokeless tobacco history on file. Alcohol:  reports that she does not drink alcohol. Drug:  reports that she does not use illicit drugs.  Physical Exam: Vitals:  Today's Vitals   07/02/15 1356 07/02/15 1357  BP:  155/62  Pulse: 67   Temp: 98.6 F (37 C)   Resp: 18   Height: 5\' 5"  (1.651 m)   Weight: 220 lb (99.791 kg)   SpO2: 98%   PainSc: 3  3   PainLoc: Back   Calculated BMI: Body mass index is 36.61 kg/(m^2). General  appearance: alert, cooperative, appears stated age, moderate distress and moderately obese Eyes: conjunctivae/corneas clear. PERRL, EOM's intact. Fundi benign. Lungs: No evidence respiratory distress, no audible rales or ronchi and no use of accessory muscles of respiration Neck: no adenopathy, no carotid bruit, no JVD, supple, symmetrical, trachea midline and thyroid not enlarged, symmetric, no tenderness/mass/nodules Back: symmetric, no curvature. ROM normal. No CVA tenderness. Extremities: extremities normal, atraumatic, no cyanosis or edema Pulses: 2+ and symmetric Skin: Skin color, texture, turgor normal. No rashes or lesions Neurologic: Gait: Antalgic    Assessment: Encounter Diagnosis:  Primary Diagnosis: Chronic pain [G89.29]  Plan: Sparkles was seen today for back pain.  Diagnoses and all orders for this visit:  Chronic pain  Chronic pain syndrome  Cervical spondylosis without myelopathy  Chronic neck pain  History of cervical spinal surgery  Spinal stenosis in cervical region -     CERVICAL EPIDURAL STEROID INJECTION; Future  Myofascial pain -     tiZANidine (ZANAFLEX) 4 MG capsule; Take 1 capsule (4 mg total) by mouth 3 (three) times daily as needed for muscle spasms.  Muscle spasms of head or neck     Patient Instructions   GENERAL RISKS AND COMPLICATIONS  What are the risk, side effects and possible complications? Generally speaking, most procedures are safe.  However, with any procedure there are risks, side effects, and the possibility of complications.  The risks and complications are dependent upon the sites that are lesioned, or the type of nerve block to be performed.  The closer the procedure is to the spine, the more serious the risks are.  Great care is taken when placing the radio frequency needles, block needles or lesioning probes, but sometimes complications can occur. 1. Infection: Any time there is an injection through the skin, there is a risk  of infection.  This is why sterile  conditions are used for these blocks.  There are four possible types of infection. 1. Localized skin infection. 2. Central Nervous System Infection-This can be in the form of Meningitis, which can be deadly. 3. Epidural Infections-This can be in the form of an epidural abscess, which can cause pressure inside of the spine, causing compression of the spinal cord with subsequent paralysis. This would require an emergency surgery to decompress, and there are no guarantees that the patient would recover from the paralysis. 4. Discitis-This is an infection of the intervertebral discs.  It occurs in about 1% of discography procedures.  It is difficult to treat and it may lead to surgery.        2. Pain: the needles have to go through skin and soft tissues, will cause soreness.       3. Damage to internal structures:  The nerves to be lesioned may be near blood vessels or    other nerves which can be potentially damaged.       4. Bleeding: Bleeding is more common if the patient is taking blood thinners such as  aspirin, Coumadin, Ticiid, Plavix, etc., or if he/she have some genetic predisposition  such as hemophilia. Bleeding into the spinal canal can cause compression of the spinal  cord with subsequent paralysis.  This would require an emergency surgery to  decompress and there are no guarantees that the patient would recover from the  paralysis.       5. Pneumothorax:  Puncturing of a lung is a possibility, every time a needle is introduced in  the area of the chest or upper back.  Pneumothorax refers to free air around the  collapsed lung(s), inside of the thoracic cavity (chest cavity).  Another two possible  complications related to a similar event would include: Hemothorax and Chylothorax.   These are variations of the Pneumothorax, where instead of air around the collapsed  lung(s), you may have blood or chyle, respectively.       6. Spinal headaches: They may occur  with any procedures in the area of the spine.       7. Persistent CSF (Cerebro-Spinal Fluid) leakage: This is a rare problem, but may occur  with prolonged intrathecal or epidural catheters either due to the formation of a fistulous  track or a dural tear.       8. Nerve damage: By working so close to the spinal cord, there is always a possibility of  nerve damage, which could be as serious as a permanent spinal cord injury with  paralysis.       9. Death:  Although rare, severe deadly allergic reactions known as "Anaphylactic  reaction" can occur to any of the medications used.      10. Worsening of the symptoms:  We can always make thing worse.  What are the chances of something like this happening? Chances of any of this occuring are extremely low.  By statistics, you have more of a chance of getting killed in a motor vehicle accident: while driving to the hospital than any of the above occurring .  Nevertheless, you should be aware that they are possibilities.  In general, it is similar to taking a shower.  Everybody knows that you can slip, hit your head and get killed.  Does that mean that you should not shower again?  Nevertheless always keep in mind that statistics do not mean anything if you happen to be on the wrong side of them.  Even if a procedure has a 1 (one) in a 1,000,000 (million) chance of going wrong, it you happen to be that one..Also, keep in mind that by statistics, you have more of a chance of having something go wrong when taking medications.  Who should not have this procedure? If you are on a blood thinning medication (e.g. Coumadin, Plavix, see list of "Blood Thinners"), or if you have an active infection going on, you should not have the procedure.  If you are taking any blood thinners, please inform your physician.  How should I prepare for this procedure?  Do not eat or drink anything at least six hours prior to the procedure.  Bring a driver with you .  It cannot be a  taxi.  Come accompanied by an adult that can drive you back, and that is strong enough to help you if your legs get weak or numb from the local anesthetic.  Take all of your medicines the morning of the procedure with just enough water to swallow them.  If you have diabetes, make sure that you are scheduled to have your procedure done first thing in the morning, whenever possible.  If you have diabetes, take only half of your insulin dose and notify our nurse that you have done so as soon as you arrive at the clinic.  If you are diabetic, but only take blood sugar pills (oral hypoglycemic), then do not take them on the morning of your procedure.  You may take them after you have had the procedure.  Do not take aspirin or any aspirin-containing medications, at least eleven (11) days prior to the procedure.  They may prolong bleeding.  Wear loose fitting clothing that may be easy to take off and that you would not mind if it got stained with Betadine or blood.  Do not wear any jewelry or perfume  Remove any nail coloring.  It will interfere with some of our monitoring equipment.  NOTE: Remember that this is not meant to be interpreted as a complete list of all possible complications.  Unforeseen problems may occur.  BLOOD THINNERS The following drugs contain aspirin or other products, which can cause increased bleeding during surgery and should not be taken for 2 weeks prior to and 1 week after surgery.  If you should need take something for relief of minor pain, you may take acetaminophen which is found in Tylenol,m Datril, Anacin-3 and Panadol. It is not blood thinner. The products listed below are.  Do not take any of the products listed below in addition to any listed on your instruction sheet.  A.P.C or A.P.C with Codeine Codeine Phosphate Capsules #3 Ibuprofen Ridaura  ABC compound Congesprin Imuran rimadil  Advil Cope Indocin Robaxisal  Alka-Seltzer Effervescent Pain Reliever and  Antacid Coricidin or Coricidin-D  Indomethacin Rufen  Alka-Seltzer plus Cold Medicine Cosprin Ketoprofen S-A-C Tablets  Anacin Analgesic Tablets or Capsules Coumadin Korlgesic Salflex  Anacin Extra Strength Analgesic tablets or capsules CP-2 Tablets Lanoril Salicylate  Anaprox Cuprimine Capsules Levenox Salocol  Anexsia-D Dalteparin Magan Salsalate  Anodynos Darvon compound Magnesium Salicylate Sine-off  Ansaid Dasin Capsules Magsal Sodium Salicylate  Anturane Depen Capsules Marnal Soma  APF Arthritis pain formula Dewitt's Pills Measurin Stanback  Argesic Dia-Gesic Meclofenamic Sulfinpyrazone  Arthritis Bayer Timed Release Aspirin Diclofenac Meclomen Sulindac  Arthritis pain formula Anacin Dicumarol Medipren Supac  Analgesic (Safety coated) Arthralgen Diffunasal Mefanamic Suprofen  Arthritis Strength Bufferin Dihydrocodeine Mepro Compound Suprol  Arthropan liquid Dopirydamole Methcarbomol with Aspirin  Synalgos  ASA tablets/Enseals Disalcid Micrainin Tagament  Ascriptin Doan's Midol Talwin  Ascriptin A/D Dolene Mobidin Tanderil  Ascriptin Extra Strength Dolobid Moblgesic Ticlid  Ascriptin with Codeine Doloprin or Doloprin with Codeine Momentum Tolectin  Asperbuf Duoprin Mono-gesic Trendar  Aspergum Duradyne Motrin or Motrin IB Triminicin  Aspirin plain, buffered or enteric coated Durasal Myochrisine Trigesic  Aspirin Suppositories Easprin Nalfon Trillsate  Aspirin with Codeine Ecotrin Regular or Extra Strength Naprosyn Uracel  Atromid-S Efficin Naproxen Ursinus  Auranofin Capsules Elmiron Neocylate Vanquish  Axotal Emagrin Norgesic Verin  Azathioprine Empirin or Empirin with Codeine Normiflo Vitamin E  Azolid Emprazil Nuprin Voltaren  Bayer Aspirin plain, buffered or children's or timed BC Tablets or powders Encaprin Orgaran Warfarin Sodium  Buff-a-Comp Enoxaparin Orudis Zorpin  Buff-a-Comp with Codeine Equegesic Os-Cal-Gesic   Buffaprin Excedrin plain, buffered or Extra Strength  Oxalid   Bufferin Arthritis Strength Feldene Oxphenbutazone   Bufferin plain or Extra Strength Feldene Capsules Oxycodone with Aspirin   Bufferin with Codeine Fenoprofen Fenoprofen Pabalate or Pabalate-SF   Buffets II Flogesic Panagesic   Buffinol plain or Extra Strength Florinal or Florinal with Codeine Panwarfarin   Buf-Tabs Flurbiprofen Penicillamine   Butalbital Compound Four-way cold tablets Penicillin   Butazolidin Fragmin Pepto-Bismol   Carbenicillin Geminisyn Percodan   Carna Arthritis Reliever Geopen Persantine   Carprofen Gold's salt Persistin   Chloramphenicol Goody's Phenylbutazone   Chloromycetin Haltrain Piroxlcam   Clmetidine heparin Plaquenil   Cllnoril Hyco-pap Ponstel   Clofibrate Hydroxy chloroquine Propoxyphen         Before stopping any of these medications, be sure to consult the physician who ordered them.  Some, such as Coumadin (Warfarin) are ordered to prevent or treat serious conditions such as "deep thrombosis", "pumonary embolisms", and other heart problems.  The amount of time that you may need off of the medication may also vary with the medication and the reason for which you were taking it.  If you are taking any of these medications, please make sure you notify your pain physician before you undergo any procedures.         Epidural Steroid Injection Patient Information  Description: The epidural space surrounds the nerves as they exit the spinal cord.  In some patients, the nerves can be compressed and inflamed by a bulging disc or a tight spinal canal (spinal stenosis).  By injecting steroids into the epidural space, we can bring irritated nerves into direct contact with a potentially helpful medication.  These steroids act directly on the irritated nerves and can reduce swelling and inflammation which often leads to decreased pain.  Epidural steroids may be injected anywhere along the spine and from the neck to the low back depending upon the  location of your pain.   After numbing the skin with local anesthetic (like Novocaine), a small needle is passed into the epidural space slowly.  You may experience a sensation of pressure while this is being done.  The entire block usually last less than 10 minutes.  Conditions which may be treated by epidural steroids:   Low back and leg pain  Neck and arm pain  Spinal stenosis  Post-laminectomy syndrome  Herpes zoster (shingles) pain  Pain from compression fractures  Preparation for the injection:  1. Do not eat any solid food or dairy products within 6 hours of your appointment.  2. You may drink clear liquids up to 2 hours before appointment.  Clear liquids include water, black coffee, juice or soda.  No milk or  cream please. 3. You may take your regular medication, including pain medications, with a sip of water before your appointment  Diabetics should hold regular insulin (if taken separately) and take 1/2 normal NPH dos the morning of the procedure.  Carry some sugar containing items with you to your appointment. 4. A driver must accompany you and be prepared to drive you home after your procedure.  5. Bring all your current medications with your. 6. An IV may be inserted and sedation may be given at the discretion of the physician.   7. A blood pressure cuff, EKG and other monitors will often be applied during the procedure.  Some patients may need to have extra oxygen administered for a short period. 8. You will be asked to provide medical information, including your allergies, prior to the procedure.  We must know immediately if you are taking blood thinners (like Coumadin/Warfarin)  Or if you are allergic to IV iodine contrast (dye). We must know if you could possible be pregnant.  Possible side-effects:  Bleeding from needle site  Infection (rare, may require surgery)  Nerve injury (rare)  Numbness & tingling (temporary)  Difficulty urinating (rare,  temporary)  Spinal headache ( a headache worse with upright posture)  Light -headedness (temporary)  Pain at injection site (several days)  Decreased blood pressure (temporary)  Weakness in arm/leg (temporary)  Pressure sensation in back/neck (temporary)  Call if you experience:  Fever/chills associated with headache or increased back/neck pain.  Headache worsened by an upright position.  New onset weakness or numbness of an extremity below the injection site  Hives or difficulty breathing (go to the emergency room)  Inflammation or drainage at the infection site  Severe back/neck pain  Any new symptoms which are concerning to you  Please note:  Although the local anesthetic injected can often make your back or neck feel good for several hours after the injection, the pain will likely return.  It takes 3-7 days for steroids to work in the epidural space.  You may not notice any pain relief for at least that one week.  If effective, we will often do a series of three injections spaced 3-6 weeks apart to maximally decrease your pain.  After the initial series, we generally will wait several months before considering a repeat injection of the same type.  If you have any questions, please call [phone] [facility]   Medications discontinued today:  There are no discontinued medications. Medications administered today:  Ms. Cannaday does not currently have medications on file.  Primary Care Physician: No primary care provider on file. Location: [facility] Note by: Terrina Docter A. Dossie Arbour, M.D, DABA, DABAPM, DABPM, DABIPP, FIPP

## 2015-07-03 ENCOUNTER — Encounter: Payer: Self-pay | Admitting: Pain Medicine

## 2015-07-03 ENCOUNTER — Ambulatory Visit: Payer: Medicare Other | Attending: Pain Medicine | Admitting: Pain Medicine

## 2015-07-03 VITALS — BP 127/68 | HR 72 | Temp 98.7°F | Resp 16 | Ht 65.0 in | Wt 220.0 lb

## 2015-07-03 DIAGNOSIS — G8929 Other chronic pain: Secondary | ICD-10-CM

## 2015-07-03 DIAGNOSIS — M542 Cervicalgia: Secondary | ICD-10-CM | POA: Insufficient documentation

## 2015-07-03 DIAGNOSIS — M4722 Other spondylosis with radiculopathy, cervical region: Secondary | ICD-10-CM | POA: Diagnosis not present

## 2015-07-03 DIAGNOSIS — M47812 Spondylosis without myelopathy or radiculopathy, cervical region: Secondary | ICD-10-CM | POA: Insufficient documentation

## 2015-07-03 DIAGNOSIS — E039 Hypothyroidism, unspecified: Secondary | ICD-10-CM | POA: Diagnosis not present

## 2015-07-03 DIAGNOSIS — R52 Pain, unspecified: Secondary | ICD-10-CM | POA: Diagnosis present

## 2015-07-03 DIAGNOSIS — E7800 Pure hypercholesterolemia, unspecified: Secondary | ICD-10-CM | POA: Diagnosis not present

## 2015-07-03 DIAGNOSIS — G43909 Migraine, unspecified, not intractable, without status migrainosus: Secondary | ICD-10-CM | POA: Diagnosis not present

## 2015-07-03 DIAGNOSIS — R51 Headache: Secondary | ICD-10-CM

## 2015-07-03 DIAGNOSIS — G4486 Cervicogenic headache: Secondary | ICD-10-CM

## 2015-07-03 DIAGNOSIS — M961 Postlaminectomy syndrome, not elsewhere classified: Secondary | ICD-10-CM

## 2015-07-03 DIAGNOSIS — M4802 Spinal stenosis, cervical region: Secondary | ICD-10-CM | POA: Diagnosis not present

## 2015-07-03 DIAGNOSIS — M797 Fibromyalgia: Secondary | ICD-10-CM | POA: Insufficient documentation

## 2015-07-03 DIAGNOSIS — F329 Major depressive disorder, single episode, unspecified: Secondary | ICD-10-CM | POA: Insufficient documentation

## 2015-07-03 DIAGNOSIS — J439 Emphysema, unspecified: Secondary | ICD-10-CM | POA: Insufficient documentation

## 2015-07-03 DIAGNOSIS — F1190 Opioid use, unspecified, uncomplicated: Secondary | ICD-10-CM | POA: Insufficient documentation

## 2015-07-03 DIAGNOSIS — M5126 Other intervertebral disc displacement, lumbar region: Secondary | ICD-10-CM | POA: Diagnosis not present

## 2015-07-03 DIAGNOSIS — M47816 Spondylosis without myelopathy or radiculopathy, lumbar region: Secondary | ICD-10-CM | POA: Insufficient documentation

## 2015-07-03 MED ORDER — DEXAMETHASONE SODIUM PHOSPHATE 10 MG/ML IJ SOLN: 10.0000 mg | Freq: Once | INTRAMUSCULAR | Status: DC

## 2015-07-03 MED ORDER — DEXAMETHASONE SODIUM PHOSPHATE 10 MG/ML IJ SOLN
INTRAMUSCULAR | Status: AC
  Filled 2015-07-03: qty 1

## 2015-07-03 MED ORDER — SODIUM CHLORIDE 0.9 % IJ SOLN: 1.0000 mL | Freq: Once | INTRAMUSCULAR | Status: DC

## 2015-07-03 MED ORDER — LIDOCAINE HCL (PF) 1 % IJ SOLN
INTRAMUSCULAR | Status: AC
  Administered 2015-07-03: 11:00:00
  Filled 2015-07-03: qty 5

## 2015-07-03 MED ORDER — ROPIVACAINE HCL 2 MG/ML IJ SOLN
INTRAMUSCULAR | Status: AC
  Administered 2015-07-03: 11:00:00
  Filled 2015-07-03: qty 10

## 2015-07-03 MED ORDER — SODIUM CHLORIDE 0.9 % IJ SOLN
INTRAMUSCULAR | Status: AC
  Administered 2015-07-03: 11:00:00
  Filled 2015-07-03: qty 10

## 2015-07-03 MED ORDER — LIDOCAINE HCL (PF) 1 % IJ SOLN: 10.0000 mL | Freq: Once | INTRAMUSCULAR | Status: DC

## 2015-07-03 MED ORDER — ROPIVACAINE HCL 2 MG/ML IJ SOLN: 1.0000 mL | Freq: Once | INTRAMUSCULAR | Status: DC

## 2015-07-03 MED ORDER — DEXAMETHASONE SODIUM PHOSPHATE 10 MG/ML IJ SOLN
INTRAMUSCULAR | Status: AC
  Administered 2015-07-03: 11:00:00
  Filled 2015-07-03: qty 1

## 2015-07-03 NOTE — Patient Instructions (Signed)
Pain Management Discharge Instructions  General Discharge Instructions :  If you need to reach your doctor call: Monday-Friday 8:00 am - 4:00 pm at [phone] or toll free [phone].  After clinic hours [phone] to have operator reach doctor.  Bring all of your medication bottles to all your appointments in the pain clinic.  To cancel or reschedule your appointment with Pain Management please remember to call 24 hours in advance to avoid a fee.  Refer to the educational materials which you have been given on: General Risks, I had my Procedure. Discharge Instructions, Post Sedation.  Post Procedure Instructions:  The drugs you were given will stay in your system until tomorrow, so for the next 24 hours you should not drive, make any legal decisions or drink any alcoholic beverages.  You may eat anything you prefer, but it is better to start with liquids then soups and crackers, and gradually work up to solid foods.  Please notify your doctor immediately if you have any unusual bleeding, trouble breathing or pain that is not related to your normal pain.  Depending on the type of procedure that was done, some parts of your body may feel week and/or numb.  This usually clears up by tonight or the next day.  Walk with the use of an assistive device or accompanied by an adult for the 24 hours.  You may use ice on the affected area for the first 24 hours.  Put ice in a Ziploc bag and cover with a towel and place against area 15 minutes on 15 minutes off.  You may switch to heat after 24 hours.GENERAL RISKS AND COMPLICATIONS  What are the risk, side effects and possible complications? Generally speaking, most procedures are safe.  However, with any procedure there are risks, side effects, and the possibility of complications.  The risks and complications are dependent upon the sites that are lesioned, or the type of nerve block to be performed.  The closer the procedure is to the spine,  the more serious the risks are.  Great care is taken when placing the radio frequency needles, block needles or lesioning probes, but sometimes complications can occur. 1. Infection: Any time there is an injection through the skin, there is a risk of infection.  This is why sterile conditions are used for these blocks.  There are four possible types of infection. 1. Localized skin infection. 2. Central Nervous System Infection-This can be in the form of Meningitis, which can be deadly. 3. Epidural Infections-This can be in the form of an epidural abscess, which can cause pressure inside of the spine, causing compression of the spinal cord with subsequent paralysis. This would require an emergency surgery to decompress, and there are no guarantees that the patient would recover from the paralysis. 4. Discitis-This is an infection of the intervertebral discs.  It occurs in about 1% of discography procedures.  It is difficult to treat and it may lead to surgery.        2. Pain: the needles have to go through skin and soft tissues, will cause soreness.       3. Damage to internal structures:  The nerves to be lesioned may be near blood vessels or    other nerves which can be potentially damaged.       4. Bleeding: Bleeding is more common if the patient is taking blood thinners such as  aspirin, Coumadin, Ticiid, Plavix, etc., or if he/she have some genetic predisposition  such as   hemophilia. Bleeding into the spinal canal can cause compression of the spinal  cord with subsequent paralysis.  This would require an emergency surgery to  decompress and there are no guarantees that the patient would recover from the  paralysis.       5. Pneumothorax:  Puncturing of a lung is a possibility, every time a needle is introduced in  the area of the chest or upper back.  Pneumothorax refers to free air around the  collapsed lung(s), inside of the thoracic cavity (chest cavity).  Another two possible  complications  related to a similar event would include: Hemothorax and Chylothorax.   These are variations of the Pneumothorax, where instead of air around the collapsed  lung(s), you may have blood or chyle, respectively.       6. Spinal headaches: They may occur with any procedures in the area of the spine.       7. Persistent CSF (Cerebro-Spinal Fluid) leakage: This is a rare problem, but may occur  with prolonged intrathecal or epidural catheters either due to the formation of a fistulous  track or a dural tear.       8. Nerve damage: By working so close to the spinal cord, there is always a possibility of  nerve damage, which could be as serious as a permanent spinal cord injury with  paralysis.       9. Death:  Although rare, severe deadly allergic reactions known as "Anaphylactic  reaction" can occur to any of the medications used.      10. Worsening of the symptoms:  We can always make thing worse.  What are the chances of something like this happening? Chances of any of this occuring are extremely low.  By statistics, you have more of a chance of getting killed in a motor vehicle accident: while driving to the hospital than any of the above occurring .  Nevertheless, you should be aware that they are possibilities.  In general, it is similar to taking a shower.  Everybody knows that you can slip, hit your head and get killed.  Does that mean that you should not shower again?  Nevertheless always keep in mind that statistics do not mean anything if you happen to be on the wrong side of them.  Even if a procedure has a 1 (one) in a 1,000,000 (million) chance of going wrong, it you happen to be that one..Also, keep in mind that by statistics, you have more of a chance of having something go wrong when taking medications.  Who should not have this procedure? If you are on a blood thinning medication (e.g. Coumadin, Plavix, see list of "Blood Thinners"), or if you have an active infection going on, you should not  have the procedure.  If you are taking any blood thinners, please inform your physician.  How should I prepare for this procedure?  Do not eat or drink anything at least six hours prior to the procedure.  Bring a driver with you .  It cannot be a taxi.  Come accompanied by an adult that can drive you back, and that is strong enough to help you if your legs get weak or numb from the local anesthetic.  Take all of your medicines the morning of the procedure with just enough water to swallow them.  If you have diabetes, make sure that you are scheduled to have your procedure done first thing in the morning, whenever possible.  If you have diabetes,   take only half of your insulin dose and notify our nurse that you have done so as soon as you arrive at the clinic.  If you are diabetic, but only take blood sugar pills (oral hypoglycemic), then do not take them on the morning of your procedure.  You may take them after you have had the procedure.  Do not take aspirin or any aspirin-containing medications, at least eleven (11) days prior to the procedure.  They may prolong bleeding.  Wear loose fitting clothing that may be easy to take off and that you would not mind if it got stained with Betadine or blood.  Do not wear any jewelry or perfume  Remove any nail coloring.  It will interfere with some of our monitoring equipment.  NOTE: Remember that this is not meant to be interpreted as a complete list of all possible complications.  Unforeseen problems may occur.  BLOOD THINNERS The following drugs contain aspirin or other products, which can cause increased bleeding during surgery and should not be taken for 2 weeks prior to and 1 week after surgery.  If you should need take something for relief of minor pain, you may take acetaminophen which is found in Tylenol,m Datril, Anacin-3 and Panadol. It is not blood thinner. The products listed below are.  Do not take any of the products listed below  in addition to any listed on your instruction sheet.  A.P.C or A.P.C with Codeine Codeine Phosphate Capsules #3 Ibuprofen Ridaura  ABC compound Congesprin Imuran rimadil  Advil Cope Indocin Robaxisal  Alka-Seltzer Effervescent Pain Reliever and Antacid Coricidin or Coricidin-D  Indomethacin Rufen  Alka-Seltzer plus Cold Medicine Cosprin Ketoprofen S-A-C Tablets  Anacin Analgesic Tablets or Capsules Coumadin Korlgesic Salflex  Anacin Extra Strength Analgesic tablets or capsules CP-2 Tablets Lanoril Salicylate  Anaprox Cuprimine Capsules Levenox Salocol  Anexsia-D Dalteparin Magan Salsalate  Anodynos Darvon compound Magnesium Salicylate Sine-off  Ansaid Dasin Capsules Magsal Sodium Salicylate  Anturane Depen Capsules Marnal Soma  APF Arthritis pain formula Dewitt's Pills Measurin Stanback  Argesic Dia-Gesic Meclofenamic Sulfinpyrazone  Arthritis Bayer Timed Release Aspirin Diclofenac Meclomen Sulindac  Arthritis pain formula Anacin Dicumarol Medipren Supac  Analgesic (Safety coated) Arthralgen Diffunasal Mefanamic Suprofen  Arthritis Strength Bufferin Dihydrocodeine Mepro Compound Suprol  Arthropan liquid Dopirydamole Methcarbomol with Aspirin Synalgos  ASA tablets/Enseals Disalcid Micrainin Tagament  Ascriptin Doan's Midol Talwin  Ascriptin A/D Dolene Mobidin Tanderil  Ascriptin Extra Strength Dolobid Moblgesic Ticlid  Ascriptin with Codeine Doloprin or Doloprin with Codeine Momentum Tolectin  Asperbuf Duoprin Mono-gesic Trendar  Aspergum Duradyne Motrin or Motrin IB Triminicin  Aspirin plain, buffered or enteric coated Durasal Myochrisine Trigesic  Aspirin Suppositories Easprin Nalfon Trillsate  Aspirin with Codeine Ecotrin Regular or Extra Strength Naprosyn Uracel  Atromid-S Efficin Naproxen Ursinus  Auranofin Capsules Elmiron Neocylate Vanquish  Axotal Emagrin Norgesic Verin  Azathioprine Empirin or Empirin with Codeine Normiflo Vitamin E  Azolid Emprazil Nuprin Voltaren  Bayer  Aspirin plain, buffered or children's or timed BC Tablets or powders Encaprin Orgaran Warfarin Sodium  Buff-a-Comp Enoxaparin Orudis Zorpin  Buff-a-Comp with Codeine Equegesic Os-Cal-Gesic   Buffaprin Excedrin plain, buffered or Extra Strength Oxalid   Bufferin Arthritis Strength Feldene Oxphenbutazone   Bufferin plain or Extra Strength Feldene Capsules Oxycodone with Aspirin   Bufferin with Codeine Fenoprofen Fenoprofen Pabalate or Pabalate-SF   Buffets II Flogesic Panagesic   Buffinol plain or Extra Strength Florinal or Florinal with Codeine Panwarfarin   Buf-Tabs Flurbiprofen Penicillamine   Butalbital Compound Four-way cold tablets   Penicillin   Butazolidin Fragmin Pepto-Bismol   Carbenicillin Geminisyn Percodan   Carna Arthritis Reliever Geopen Persantine   Carprofen Gold's salt Persistin   Chloramphenicol Goody's Phenylbutazone   Chloromycetin Haltrain Piroxlcam   Clmetidine heparin Plaquenil   Cllnoril Hyco-pap Ponstel   Clofibrate Hydroxy chloroquine Propoxyphen         Before stopping any of these medications, be sure to consult the physician who ordered them.  Some, such as Coumadin (Warfarin) are ordered to prevent or treat serious conditions such as "deep thrombosis", "pumonary embolisms", and other heart problems.  The amount of time that you may need off of the medication may also vary with the medication and the reason for which you were taking it.  If you are taking any of these medications, please make sure you notify your pain physician before you undergo any procedures.         Epidural Steroid Injection Patient Information  Description: The epidural space surrounds the nerves as they exit the spinal cord.  In some patients, the nerves can be compressed and inflamed by a bulging disc or a tight spinal canal (spinal stenosis).  By injecting steroids into the epidural space, we can bring irritated nerves into direct contact with a potentially helpful medication.   These steroids act directly on the irritated nerves and can reduce swelling and inflammation which often leads to decreased pain.  Epidural steroids may be injected anywhere along the spine and from the neck to the low back depending upon the location of your pain.   After numbing the skin with local anesthetic (like Novocaine), a small needle is passed into the epidural space slowly.  You may experience a sensation of pressure while this is being done.  The entire block usually last less than 10 minutes.  Conditions which may be treated by epidural steroids:   Low back and leg pain  Neck and arm pain  Spinal stenosis  Post-laminectomy syndrome  Herpes zoster (shingles) pain  Pain from compression fractures  Preparation for the injection:  1. Do not eat any solid food or dairy products within 6 hours of your appointment.  2. You may drink clear liquids up to 2 hours before appointment.  Clear liquids include water, black coffee, juice or soda.  No milk or cream please. 3. You may take your regular medication, including pain medications, with a sip of water before your appointment  Diabetics should hold regular insulin (if taken separately) and take 1/2 normal NPH dos the morning of the procedure.  Carry some sugar containing items with you to your appointment. 4. A driver must accompany you and be prepared to drive you home after your procedure.  5. Bring all your current medications with your. 6. An IV may be inserted and sedation may be given at the discretion of the physician.   7. A blood pressure cuff, EKG and other monitors will often be applied during the procedure.  Some patients may need to have extra oxygen administered for a short period. 8. You will be asked to provide medical information, including your allergies, prior to the procedure.  We must know immediately if you are taking blood thinners (like Coumadin/Warfarin)  Or if you are allergic to IV iodine contrast (dye). We  must know if you could possible be pregnant.  Possible side-effects:  Bleeding from needle site  Infection (rare, may require surgery)  Nerve injury (rare)  Numbness & tingling (temporary)  Difficulty urinating (rare, temporary)  Spinal headache (   a headache worse with upright posture)  Light -headedness (temporary)  Pain at injection site (several days)  Decreased blood pressure (temporary)  Weakness in arm/leg (temporary)  Pressure sensation in back/neck (temporary)  Call if you experience:  Fever/chills associated with headache or increased back/neck pain.  Headache worsened by an upright position.  New onset weakness or numbness of an extremity below the injection site  Hives or difficulty breathing (go to the emergency room)  Inflammation or drainage at the infection site  Severe back/neck pain  Any new symptoms which are concerning to you  Please note:  Although the local anesthetic injected can often make your back or neck feel good for several hours after the injection, the pain will likely return.  It takes 3-7 days for steroids to work in the epidural space.  You may not notice any pain relief for at least that one week.  If effective, we will often do a series of three injections spaced 3-6 weeks apart to maximally decrease your pain.  After the initial series, we generally will wait several months before considering a repeat injection of the same type.  If you have any questions, please call [phone] [facility]

## 2015-07-03 NOTE — Progress Notes (Signed)
Patient's Name: Regina Campos MRN: [id number] DOB: 1950/09/01 DOS: 07/03/2015  Primary Reason(s) for Visit: Interventional Pain Management Treatment. CC: Pain   Pre-Procedure Assessment: Ms. Sieloff is a 64 y.o. year old, female patient, seen today for interventional treatment. She has Encounter for therapeutic drug level monitoring; Long term current use of opiate analgesic; Uncomplicated opioid dependence ([place]); Opiate use; Chronic neck pain; Chronic low back pain; Lumbar radicular pain; Lumbar spondylosis with radicular symptoms; Chronic pain syndrome; Disorder of shoulder; Pulmonary emphysema ([place]); Atypical migraine; Apnea, sleep; Displacement of lumbar intervertebral disc; High cholesterol; Hypothyroidism; History of cervical spinal surgery; Foraminal stenosis of cervical region; Arthropathy of shoulder region; Cervicogenic headache; Pain in joint involving right lower leg; Fibromyalgia; History of cardiac arrhythmia; Depression; Obesity; Failed back surgical syndrome; Spinal stenosis in cervical region; Chronic pain; Long term prescription opiate use; Myofascial pain; Muscle spasms of head or neck; Cervical spondylosis with radiculopathy (Right side); and 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 Her primarily concern today is the Pain  Verification of the correct person, correct site (including marking of site), and correct procedure were performed and confirmed by the patient. Today's Vitals   07/03/15 1036 07/03/15 1121 07/03/15 1126 07/03/15 1131  BP: 122/92 125/68 131/68 127/68  Pulse: 76 67 70 72  Temp: 98.7 F (37.1 C)     TempSrc: Oral     Resp: _0 Height: _1  (1.651 m)     Weight: 220 lb (99.791 kg)     SpO2: 98% 96% 95% 94%  PainSc: _2 PainLoc: Neck     Calculated BMI: Body mass index is 36.61 kg/(m^2). Allergies: She has No Known Allergies.. Primary Diagnosis: Cervical spondylosis with radiculopathy  [M47.22]  Procedure: Type: Diagnostic Inter-Laminar Epidural Steroid Injection Region: Posterior Cervical Level: C7-T1  Laterality: Right-Sided Paramedial  Indications: Spondylosis, Cervical Region  Consent: Secured. Under the influence of no sedatives a written informed consent was obtained, after having provided information on the risks and possible complications. To fulfill our ethical and legal obligations, as recommended by the American Medical Association's Code of Ethics, we have provided information to the patient about our clinical impression; the nature and purpose of the treatment or procedure; the risks, benefits, and possible complications of the intervention; alternatives; the risk(s) and benefit(s) of the alternative treatment(s) or procedure(s); and the risk(s) and benefit(s) of doing nothing. The patient was provided information about the risks and possible complications associated with the procedure. In the case of spinal procedures these may include, but are not limited to, failure to achieve desired goals, infection, bleeding, organ or nerve damage, allergic reactions, paralysis, and death. In addition, the patient was informed that Medicine is not an exact science; therefore, there is also the possibility of unforeseen risks and possible complications that may result in a catastrophic outcome. The patient indicated having understood very clearly. We have given the patient no guarantees and we have made no promises. Enough time was given to the patient to ask questions, all of which were answered to the patient's satisfaction.  Pre-Procedure Preparation: Safety Precautions: Allergies reviewed. Appropriate site, procedure, and patient were confirmed by following the Joint Commission's Universal Protocol (UP.01.01.01), in the form of a "Time Out". The patient was asked to confirm marked site and procedure, before commencing. The patient was asked about blood thinners, or active  infections, both of which were denied. Patient was assessed for positional comfort and all  pressure points were checked before starting procedure. Monitoring:  As per clinic protocol. Infection Control Precautions: Sterile technique used. Standard Universal Precautions were taken as recommended by the Department of [facility] for Disease Control and Prevention (CDC). Standard pre-surgical skin prep was conducted. Respiratory hygiene and cough etiquette was practiced. Hand hygiene observed. Safe injection practices and needle disposal techniques followed. SDV (single dose vial) medications used. Medications properly checked for expiration dates and contaminants. Personal protective equipment (PPE) used: Surgical mask. Sterile double glove technique. Radiation resistant gloves. Sterile surgical gloves.  Anesthesia, Analgesia, Anxiolysis: Type: Local Anesthesia Local Anesthetic: Lidocaine 1% Route: Subcutaneous IV Access: Declined.", Sedation: Declined.  Indication(s):Not applicable.  Description of Procedure Process:  Time-out: "Time-out" completed before starting procedure, as per protocol. Position: Prone with head of the table was raised to facilitate breathing. Target Area: For Epidural Steroid injections the target is the interlaminar space, initially targeting the lower border of the superior vertebral body lamina. Approach: Paramedial approach. Area Prepped: Entire PosteriorCervical Region Prepping solution: ChloraPrep (2% chlorhexidine gluconate and 70% isopropyl alcohol) Safety Precautions: Aspiration looking for blood return was conducted prior to all injections. At no point did we inject any substances, as a needle was being advanced. No attempts were made at seeking any paresthesias. Safe injection practices and needle disposal techniques used. Medications properly checked for expiration dates. SDV (single dose vial) medications used. Description of the Procedure: Protocol  guidelines were followed. The procedure needle was introduced through the skin, ipsilateral to the reported pain, and advanced to the target area. Bone was contacted and the needle walked caudad, until the lamina was cleared. The epidural space was identified using "loss-of-resistance technique" with 2-3 ml of PF-NaCl (0.9% NSS), in a 5cc LOR glass syringe. EBL: None Materials & Medications Used:  Needle(s) Used: 20g - 10cm, Tuohy-style epidural needle Medications Administered today: We administered ropivacaine (PF) 2 mg/ml (0.2%), lidocaine (PF), sodium chloride, and dexamethasone.Please see chart orders for dosing details.  Imaging Guidance:  Type of Imaging Technique: Fluoroscopy Guidance (Spinal) Indication(s): Assistance in needle guidance and placement for procedures requiring needle placement in or near specific anatomical locations not easily accessible without such assistance. Exposure Time: Please see chart for details. Contrast: None required. Fluoroscopic Guidance: I was personally present in the fluoroscopy suite, where the patient was placed in position for the procedure, over the fluoroscopy-compatible table. Fluoroscopy was manipulated, using "Tunnel Vision Technique", to obtain the best possible view of the target area, on the affected side. Parallax error was corrected before commencing the procedure. A "direction-depth-direction" technique was used to introduce the needle under continuous pulsed fluoroscopic guidance. Once the target was reached, antero-posterior, oblique, and lateral fluoroscopic projection views were taken to confirm needle placement in all planes. Permanently recorded images stored by scanning into EMR. Interpretation: Intraoperative imaging interpretation by performing Physician. Adequate needle placement confirmed. Adequate needle placement confirmed in AP, lateral, & Oblique Views. No contrast injected. Permanent hardcopy images in multiple planes scanned into  the patient's record.  Antibiotics:  Type:  Antibiotics Given (last 72 hours)    None      Indication(s): No indications identified.  Post-operative Assessment:  Complications: No immediate post-treatment complications were observed. Disposition: Return to clinic for follow-up evaluation. The patient tolerated the entire procedure well. A repeat set of vitals were taken after the procedure and the patient was kept under observation following institutional policy, for this procedure. Post-procedural neurological assessment was performed, showing return to baseline, prior to discharge. The patient was  discharged home, once institutional criteria were met. The patient was provided with post-procedure discharge instructions, including a section on how to identify potential problems. Should any problems arise concerning this procedure, the patient was given instructions to immediately contact us, at any time, without hesitation. In any case, we plan to contact the patient by telephone for a follow-up status report regarding this interventional procedure. Comments:  No additional relevant information.  Primary Care Physician: No primary care provider on file. Location: [facility] Note by: Levern Pitter A. Dossie Arbour, M.D, DABA, DABAPM, DABPM, DABIPP, FIPP  Disclaimer:  Medicine is not an exact science. The only guarantee in medicine is that nothing is guaranteed. It is important to note that the decision to proceed with this intervention was based on the information collected from the patient. The Data and conclusions were drawn from the patient's questionnaire, the interview, and the physical examination. Because the information was provided in large part by the patient, it cannot be guaranteed that it has not been purposely or unconsciously manipulated. Every effort has been made to obtain as much relevant data as possible for this evaluation. It is important to note that the  conclusions that lead to this procedure are derived in large part from the available data. Always take into account that the treatment will also be dependent on availability of resources and existing treatment guidelines, considered by other Pain Management Practitioners as being common knowledge and practice, at the time of the intervention. For Medico-Legal purposes, it is also important to point out that variation in procedural techniques and pharmacological choices are the acceptable norm. The indications, contraindications, technique, and results of the above procedure should only be interpreted and judged by a Board-Certified Interventional Pain Specialist with extensive familiarity and expertise in the same exact procedure and technique. Attempts at providing opinions without similar or greater experience and expertise than that of the treating physician will be considered as inappropriate and unethical, and shall result in a formal complaint to the state medical board and applicable specialty societies.

## 2015-07-04 ENCOUNTER — Telehealth: Payer: Self-pay

## 2015-07-04 ENCOUNTER — Telehealth: Payer: Self-pay | Admitting: *Deleted

## 2015-07-04 NOTE — Telephone Encounter (Signed)
Patient states that she is doing just fine.

## 2015-07-09 NOTE — Telephone Encounter (Signed)
Call back complete

## 2015-07-17 ENCOUNTER — Ambulatory Visit: Payer: Medicare Other | Attending: Pain Medicine | Admitting: Pain Medicine

## 2015-07-17 ENCOUNTER — Encounter: Payer: Self-pay | Admitting: Pain Medicine

## 2015-07-17 VITALS — BP 149/76 | HR 67 | Temp 98.2°F | Resp 18 | Ht 65.0 in | Wt 230.0 lb

## 2015-07-17 DIAGNOSIS — G43909 Migraine, unspecified, not intractable, without status migrainosus: Secondary | ICD-10-CM | POA: Insufficient documentation

## 2015-07-17 DIAGNOSIS — M4802 Spinal stenosis, cervical region: Secondary | ICD-10-CM | POA: Insufficient documentation

## 2015-07-17 DIAGNOSIS — E669 Obesity, unspecified: Secondary | ICD-10-CM | POA: Insufficient documentation

## 2015-07-17 DIAGNOSIS — M4726 Other spondylosis with radiculopathy, lumbar region: Secondary | ICD-10-CM | POA: Diagnosis not present

## 2015-07-17 DIAGNOSIS — M549 Dorsalgia, unspecified: Secondary | ICD-10-CM | POA: Diagnosis present

## 2015-07-17 DIAGNOSIS — F1190 Opioid use, unspecified, uncomplicated: Secondary | ICD-10-CM | POA: Insufficient documentation

## 2015-07-17 DIAGNOSIS — E039 Hypothyroidism, unspecified: Secondary | ICD-10-CM | POA: Insufficient documentation

## 2015-07-17 DIAGNOSIS — M47816 Spondylosis without myelopathy or radiculopathy, lumbar region: Secondary | ICD-10-CM | POA: Insufficient documentation

## 2015-07-17 DIAGNOSIS — M797 Fibromyalgia: Secondary | ICD-10-CM | POA: Insufficient documentation

## 2015-07-17 DIAGNOSIS — E7800 Pure hypercholesterolemia, unspecified: Secondary | ICD-10-CM | POA: Diagnosis not present

## 2015-07-17 DIAGNOSIS — M545 Low back pain: Secondary | ICD-10-CM | POA: Diagnosis not present

## 2015-07-17 DIAGNOSIS — G8929 Other chronic pain: Secondary | ICD-10-CM | POA: Insufficient documentation

## 2015-07-17 DIAGNOSIS — F329 Major depressive disorder, single episode, unspecified: Secondary | ICD-10-CM | POA: Diagnosis not present

## 2015-07-17 DIAGNOSIS — M5126 Other intervertebral disc displacement, lumbar region: Secondary | ICD-10-CM | POA: Insufficient documentation

## 2015-07-17 DIAGNOSIS — M5136 Other intervertebral disc degeneration, lumbar region: Secondary | ICD-10-CM | POA: Diagnosis not present

## 2015-07-17 DIAGNOSIS — J438 Other emphysema: Secondary | ICD-10-CM | POA: Diagnosis not present

## 2015-07-17 NOTE — Progress Notes (Signed)
Patient's Name: Regina Campos MRN: [id number] DOB: [birth year] DOS: 07/17/2015  Primary Reason(s) for Visit: Post-Procedure evaluation CC: Back Pain   HPI:   Regina Campos is a 64 y.o. year old, female patient, who returns today as an established patient. She has Encounter for therapeutic drug level monitoring; Long term current use of opiate analgesic; Uncomplicated opioid dependence ([place]); Opiate use; Chronic neck pain; Chronic low back pain; Lumbar radicular pain; Lumbar spondylosis with radicular symptoms; Chronic pain syndrome; Disorder of shoulder; Pulmonary emphysema ([place]); Atypical migraine; Apnea, sleep; Displacement of lumbar intervertebral disc; High cholesterol; Hypothyroidism; History of cervical spinal surgery; Foraminal stenosis of cervical region; Arthropathy of shoulder region; Cervicogenic headache; Pain in joint involving right lower leg; Fibromyalgia; History of cardiac arrhythmia; Depression; Obesity; Failed back surgical syndrome; Spinal stenosis in cervical region; Chronic pain; Long term prescription opiate use; Myofascial pain; Muscle spasms of head or neck; Cervical spondylosis with radiculopathy (Right side); and 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 Her primarily concern today is the Back Pain     The patient returns to the clinic today after having had a right-sided cervical epidural steroid injection under fluoroscopic guidance. She indicates that this worked very well in controlling her neck pain and headaches. However, she is now having pain from the lower back. She would like to have that area injected since it has worked well for her in the past.  Today's Pain Score: 7 , clinically she looks like a 3-4/10. Reported level of pain is incompatible with clinical obrservations. This may be secondary to a possible lack of understanding on how the pain scale works. Pain Type: Chronic pain Pain Location: Back Pain Orientation:  Lower Pain Descriptors / Indicators: Shooting Pain Frequency: Intermittent  Date of Last Visit: 07/03/15 Service Provided on Last Visit: Procedure ([facility]I)  Pharmacotherapy Review:   Side-effects or Adverse reactions: None reported. Effectiveness: Described as relatively effective, allowing for increase in activities of daily living (ADL). Onset of action: Within expected pharmacological parameters. Duration of action: Within normal limits for medication. Peak effect: Timing and results are as within normal expected parameters. [place] PMP: Compliant with practice rules and regulations. Medication Assessment Form: Reviewed. Patient indicates being compliant with therapy Treatment compliance: Compliant. Substance Use Disorder (SUD) Risk Level: Low Pharmacologic Plan: Continue therapy as is.  Last Available Lab Work: No visits with results within 3 Month(s) from this visit. Latest known visit with results is:  [facility] Conversion on 12/06/2012  Component Date Value Ref Range Status  . Erythrocyte Sed Rate 12/06/2012 11  0-30 mm/hr Final   Procedure Assessment:  Procedure done on last visit: Right-sided cervical epidural steroid injection under fluoroscopic guidance, without sedation. Side-effects or Adverse reactions: None reported. Sedation: No sedation used during procedure.  Results: Ultra-Short Term Relief (First 1 hour after procedure): 100 % Short Term Relief (Initial 4-6 hrs after procedure): 100 % Long Term Relief : 80 % (ongoing)  Current Relief (Now):  80% Interpretation of Results: Ultra-short term relief is a normal physiological response to analgesics and anesthetics provided during the procedure. Short-term relief confirms injected site as etiology of pain. Long term relief is possibly due to sympathetic blockade, or the effects of steroids, if administered during procedure. Persistent relief would suggest effective anti-inflammatory effects from steroids.  She is doing great  with her as to the cervical region, but her low back pain and leg pain has returned. She would like to have this repeated. We'll go ahead and  do that as soon as she is over her bronchitis.  Allergies: Ms. Stables has No Known Allergies.  Meds: The patient has a current medication list which includes the following prescription(s): albuterol, budesonide-formoterol, fenofibrate, levothyroxine, lubiprostone, meloxicam, polyethylene glycol powder, rosuvastatin, sumatriptan, tizanidine, and tramadol, and the following Facility-Administered Medications: dexamethasone, lidocaine (pf), ropivacaine (pf) 2 mg/ml (0.2%), and sodium chloride. Requested Prescriptions    No prescriptions requested or ordered in this encounter    ROS: Constitutional: Afebrile, no chills, well hydrated and well nourished Gastrointestinal: negative Musculoskeletal:negative Neurological: negative Behavioral/Psych: negative  PFSH: Medical:  Ms. Guillet  has a past medical history of Hyperlipidemia; Arthritis; Thyroid disease; Hypothyroidism (06/04/2015); History of cervical spinal surgery (06/04/2015); and History of cardiac arrhythmia (06/04/2015). Family: family history includes Cancer in her mother; Heart disease in her father. Surgical:  has past surgical history that includes Abdominal hysterectomy; Total shoulder replacement; Replacement total knee; Appendectomy; Lumbar fusion; and Carpal tunnel release. Tobacco:  reports that she has never smoked. She does not have any smokeless tobacco history on file. Alcohol:  reports that she does not drink alcohol. Drug:  reports that she does not use illicit drugs.  Physical Exam: Vitals:  Today's Vitals   07/17/15 1025 07/17/15 1026  BP:  149/76  Pulse: 67   Temp: 98.2 F (36.8 C)   Resp: 18   Height: 5\' 5"  (1.651 m)   Weight: 230 lb (104.327 kg)   SpO2: 98%   PainSc: 7  7   PainLoc: Back   Calculated BMI: Body mass index is 38.27 kg/(m^2). General appearance:  alert, cooperative, appears stated age, mild distress and moderately obese Eyes: conjunctivae/corneas clear. PERRL, EOM's intact. Fundi benign. Lungs: No evidence respiratory distress, no audible rales or ronchi and no use of accessory muscles of respiration Neck: no adenopathy, no carotid bruit, no JVD, supple, symmetrical, trachea midline and thyroid not enlarged, symmetric, no tenderness/mass/nodules Back: symmetric, no curvature. ROM normal. No CVA tenderness. Extremities: extremities normal, atraumatic, no cyanosis or edema Pulses: 2+ and symmetric Skin: Skin color, texture, turgor normal. 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  Diagnoses and all orders for this visit:  Lumbar spondylosis with radicular symptoms -     LUMBAR EPIDURAL STEROID INJECTION; Standing     There are no Patient Instructions on file for this visit. Medications discontinued today:  There are no discontinued medications. Medications administered today:  Ms. Utech had no medications administered during this visit.  Primary Care Physician: No primary care provider on file. Location: [facility] Note by: Reeanna Acri A. Dossie Arbour, M.D, DABA, DABAPM, DABPM, DABIPP, FIPP

## 2015-07-17 NOTE — Progress Notes (Signed)
Safety precautions to be maintained throughout the outpatient stay will include: orient to surroundings, keep bed in low position, maintain call bell within reach at all times, provide assistance with transfer out of bed and ambulation.  

## 2015-09-03 ENCOUNTER — Ambulatory Visit: Admitting: Pain Medicine

## 2015-09-04 ENCOUNTER — Ambulatory Visit: Payer: Medicare Other | Attending: Pain Medicine | Admitting: Pain Medicine

## 2015-09-04 ENCOUNTER — Encounter: Payer: Self-pay | Admitting: Pain Medicine

## 2015-09-04 VITALS — BP 172/91 | HR 62 | Temp 98.2°F | Resp 16 | Ht 65.0 in | Wt 220.0 lb

## 2015-09-04 DIAGNOSIS — E669 Obesity, unspecified: Secondary | ICD-10-CM | POA: Diagnosis not present

## 2015-09-04 DIAGNOSIS — E039 Hypothyroidism, unspecified: Secondary | ICD-10-CM | POA: Insufficient documentation

## 2015-09-04 DIAGNOSIS — G43909 Migraine, unspecified, not intractable, without status migrainosus: Secondary | ICD-10-CM | POA: Insufficient documentation

## 2015-09-04 DIAGNOSIS — J439 Emphysema, unspecified: Secondary | ICD-10-CM | POA: Insufficient documentation

## 2015-09-04 DIAGNOSIS — F329 Major depressive disorder, single episode, unspecified: Secondary | ICD-10-CM | POA: Diagnosis not present

## 2015-09-04 DIAGNOSIS — M62838 Other muscle spasm: Secondary | ICD-10-CM | POA: Insufficient documentation

## 2015-09-04 DIAGNOSIS — M961 Postlaminectomy syndrome, not elsewhere classified: Secondary | ICD-10-CM

## 2015-09-04 DIAGNOSIS — M4802 Spinal stenosis, cervical region: Secondary | ICD-10-CM | POA: Diagnosis not present

## 2015-09-04 DIAGNOSIS — M545 Low back pain: Secondary | ICD-10-CM | POA: Diagnosis not present

## 2015-09-04 DIAGNOSIS — E7800 Pure hypercholesterolemia, unspecified: Secondary | ICD-10-CM | POA: Insufficient documentation

## 2015-09-04 DIAGNOSIS — M4726 Other spondylosis with radiculopathy, lumbar region: Secondary | ICD-10-CM | POA: Diagnosis not present

## 2015-09-04 DIAGNOSIS — G8929 Other chronic pain: Secondary | ICD-10-CM | POA: Insufficient documentation

## 2015-09-04 DIAGNOSIS — M5126 Other intervertebral disc displacement, lumbar region: Secondary | ICD-10-CM | POA: Insufficient documentation

## 2015-09-04 DIAGNOSIS — M542 Cervicalgia: Secondary | ICD-10-CM | POA: Insufficient documentation

## 2015-09-04 DIAGNOSIS — M539 Dorsopathy, unspecified: Secondary | ICD-10-CM

## 2015-09-04 DIAGNOSIS — M5416 Radiculopathy, lumbar region: Secondary | ICD-10-CM

## 2015-09-04 DIAGNOSIS — M549 Dorsalgia, unspecified: Secondary | ICD-10-CM | POA: Diagnosis present

## 2015-09-04 DIAGNOSIS — M797 Fibromyalgia: Secondary | ICD-10-CM | POA: Diagnosis not present

## 2015-09-04 DIAGNOSIS — M47816 Spondylosis without myelopathy or radiculopathy, lumbar region: Secondary | ICD-10-CM | POA: Insufficient documentation

## 2015-09-04 DIAGNOSIS — F1190 Opioid use, unspecified, uncomplicated: Secondary | ICD-10-CM | POA: Diagnosis not present

## 2015-09-04 MED ORDER — LIDOCAINE HCL (PF) 1 % IJ SOLN
INTRAMUSCULAR | Status: AC
  Administered 2015-09-04: 10:00:00
  Filled 2015-09-04: qty 5

## 2015-09-04 MED ORDER — SODIUM CHLORIDE 0.9 % IJ SOLN
INTRAMUSCULAR | Status: AC
  Administered 2015-09-04: 10:00:00
  Filled 2015-09-04: qty 10

## 2015-09-04 MED ORDER — ROPIVACAINE HCL 2 MG/ML IJ SOLN
INTRAMUSCULAR | Status: AC
  Administered 2015-09-04: 10:00:00
  Filled 2015-09-04: qty 10

## 2015-09-04 MED ORDER — MELOXICAM 15 MG PO TABS: 15.0000 mg | ORAL_TABLET | Freq: Every day | ORAL | Status: DC

## 2015-09-04 MED ORDER — TRIAMCINOLONE ACETONIDE 40 MG/ML IJ SUSP
INTRAMUSCULAR | Status: AC
  Administered 2015-09-04: 10:00:00
  Filled 2015-09-04: qty 1

## 2015-09-04 MED ORDER — IOHEXOL 180 MG/ML  SOLN
INTRAMUSCULAR | Status: AC
  Administered 2015-09-04: 10:00:00
  Filled 2015-09-04: qty 20

## 2015-09-04 NOTE — Patient Instructions (Addendum)
GENERAL RISKS AND COMPLICATIONS  What are the risk, side effects and possible complications? Generally speaking, most procedures are safe.  However, with any procedure there are risks, side effects, and the possibility of complications.  The risks and complications are dependent upon the sites that are lesioned, or the type of nerve block to be performed.  The closer the procedure is to the spine, the more serious the risks are.  Great care is taken when placing the radio frequency needles, block needles or lesioning probes, but sometimes complications can occur. 1. Infection: Any time there is an injection through the skin, there is a risk of infection.  This is why sterile conditions are used for these blocks.  There are four possible types of infection. 1. Localized skin infection. 2. Central Nervous System Infection-This can be in the form of Meningitis, which can be deadly. 3. Epidural Infections-This can be in the form of an epidural abscess, which can cause pressure inside of the spine, causing compression of the spinal cord with subsequent paralysis. This would require an emergency surgery to decompress, and there are no guarantees that the patient would recover from the paralysis. 4. Discitis-This is an infection of the intervertebral discs.  It occurs in about 1% of discography procedures.  It is difficult to treat and it may lead to surgery.        2. Pain: the needles have to go through skin and soft tissues, will cause soreness.       3. Damage to internal structures:  The nerves to be lesioned may be near blood vessels or    other nerves which can be potentially damaged.       4. Bleeding: Bleeding is more common if the patient is taking blood thinners such as  aspirin, Coumadin, Ticiid, Plavix, etc., or if he/she have some genetic predisposition  such as hemophilia. Bleeding into the spinal canal can cause compression of the spinal  cord with subsequent paralysis.  This would require an  emergency surgery to  decompress and there are no guarantees that the patient would recover from the  paralysis.       5. Pneumothorax:  Puncturing of a lung is a possibility, every time a needle is introduced in  the area of the chest or upper back.  Pneumothorax refers to free air around the  collapsed lung(s), inside of the thoracic cavity (chest cavity).  Another two possible  complications related to a similar event would include: Hemothorax and Chylothorax.   These are variations of the Pneumothorax, where instead of air around the collapsed  lung(s), you may have blood or chyle, respectively.       6. Spinal headaches: They may occur with any procedures in the area of the spine.       7. Persistent CSF (Cerebro-Spinal Fluid) leakage: This is a rare problem, but may occur  with prolonged intrathecal or epidural catheters either due to the formation of a fistulous  track or a dural tear.       8. Nerve damage: By working so close to the spinal cord, there is always a possibility of  nerve damage, which could be as serious as a permanent spinal cord injury with  paralysis.       9. Death:  Although rare, severe deadly allergic reactions known as "Anaphylactic  reaction" can occur to any of the medications used.      10. Worsening of the symptoms:  We can always make thing worse.    What are the chances of something like this happening? Chances of any of this occuring are extremely low.  By statistics, you have more of a chance of getting killed in a motor vehicle accident: while driving to the hospital than any of the above occurring .  Nevertheless, you should be aware that they are possibilities.  In general, it is similar to taking a shower.  Everybody knows that you can slip, hit your head and get killed.  Does that mean that you should not shower again?  Nevertheless always keep in mind that statistics do not mean anything if you happen to be on the wrong side of them.  Even if a procedure has a 1  (one) in a 1,000,000 (million) chance of going wrong, it you happen to be that one..Also, keep in mind that by statistics, you have more of a chance of having something go wrong when taking medications.  Who should not have this procedure? If you are on a blood thinning medication (e.g. Coumadin, Plavix, see list of "Blood Thinners"), or if you have an active infection going on, you should not have the procedure.  If you are taking any blood thinners, please inform your physician.  How should I prepare for this procedure?  Do not eat or drink anything at least six hours prior to the procedure.  Bring a driver with you .  It cannot be a taxi.  Come accompanied by an adult that can drive you back, and that is strong enough to help you if your legs get weak or numb from the local anesthetic.  Take all of your medicines the morning of the procedure with just enough water to swallow them.  If you have diabetes, make sure that you are scheduled to have your procedure done first thing in the morning, whenever possible.  If you have diabetes, take only half of your insulin dose and notify our nurse that you have done so as soon as you arrive at the clinic.  If you are diabetic, but only take blood sugar pills (oral hypoglycemic), then do not take them on the morning of your procedure.  You may take them after you have had the procedure.  Do not take aspirin or any aspirin-containing medications, at least eleven (11) days prior to the procedure.  They may prolong bleeding.  Wear loose fitting clothing that may be easy to take off and that you would not mind if it got stained with Betadine or blood.  Do not wear any jewelry or perfume  Remove any nail coloring.  It will interfere with some of our monitoring equipment.  NOTE: Remember that this is not meant to be interpreted as a complete list of all possible complications.  Unforeseen problems may occur.  BLOOD THINNERS The following drugs  contain aspirin or other products, which can cause increased bleeding during surgery and should not be taken for 2 weeks prior to and 1 week after surgery.  If you should need take something for relief of minor pain, you may take acetaminophen which is found in Tylenol,m Datril, Anacin-3 and Panadol. It is not blood thinner. The products listed below are.  Do not take any of the products listed below in addition to any listed on your instruction sheet.  A.P.C or A.P.C with Codeine Codeine Phosphate Capsules #3 Ibuprofen Ridaura  ABC compound Congesprin Imuran rimadil  Advil Cope Indocin Robaxisal  Alka-Seltzer Effervescent Pain Reliever and Antacid Coricidin or Coricidin-D  Indomethacin Rufen    Alka-Seltzer plus Cold Medicine Cosprin Ketoprofen S-A-C Tablets  Anacin Analgesic Tablets or Capsules Coumadin Korlgesic Salflex  Anacin Extra Strength Analgesic tablets or capsules CP-2 Tablets Lanoril Salicylate  Anaprox Cuprimine Capsules Levenox Salocol  Anexsia-D Dalteparin Magan Salsalate  Anodynos Darvon compound Magnesium Salicylate Sine-off  Ansaid Dasin Capsules Magsal Sodium Salicylate  Anturane Depen Capsules Marnal Soma  APF Arthritis pain formula Dewitt's Pills Measurin Stanback  Argesic Dia-Gesic Meclofenamic Sulfinpyrazone  Arthritis Bayer Timed Release Aspirin Diclofenac Meclomen Sulindac  Arthritis pain formula Anacin Dicumarol Medipren Supac  Analgesic (Safety coated) Arthralgen Diffunasal Mefanamic Suprofen  Arthritis Strength Bufferin Dihydrocodeine Mepro Compound Suprol  Arthropan liquid Dopirydamole Methcarbomol with Aspirin Synalgos  ASA tablets/Enseals Disalcid Micrainin Tagament  Ascriptin Doan's Midol Talwin  Ascriptin A/D Dolene Mobidin Tanderil  Ascriptin Extra Strength Dolobid Moblgesic Ticlid  Ascriptin with Codeine Doloprin or Doloprin with Codeine Momentum Tolectin  Asperbuf Duoprin Mono-gesic Trendar  Aspergum Duradyne Motrin or Motrin IB Triminicin  Aspirin  plain, buffered or enteric coated Durasal Myochrisine Trigesic  Aspirin Suppositories Easprin Nalfon Trillsate  Aspirin with Codeine Ecotrin Regular or Extra Strength Naprosyn Uracel  Atromid-S Efficin Naproxen Ursinus  Auranofin Capsules Elmiron Neocylate Vanquish  Axotal Emagrin Norgesic Verin  Azathioprine Empirin or Empirin with Codeine Normiflo Vitamin E  Azolid Emprazil Nuprin Voltaren  Bayer Aspirin plain, buffered or children's or timed BC Tablets or powders Encaprin Orgaran Warfarin Sodium  Buff-a-Comp Enoxaparin Orudis Zorpin  Buff-a-Comp with Codeine Equegesic Os-Cal-Gesic   Buffaprin Excedrin plain, buffered or Extra Strength Oxalid   Bufferin Arthritis Strength Feldene Oxphenbutazone   Bufferin plain or Extra Strength Feldene Capsules Oxycodone with Aspirin   Bufferin with Codeine Fenoprofen Fenoprofen Pabalate or Pabalate-SF   Buffets II Flogesic Panagesic   Buffinol plain or Extra Strength Florinal or Florinal with Codeine Panwarfarin   Buf-Tabs Flurbiprofen Penicillamine   Butalbital Compound Four-way cold tablets Penicillin   Butazolidin Fragmin Pepto-Bismol   Carbenicillin Geminisyn Percodan   Carna Arthritis Reliever Geopen Persantine   Carprofen Gold's salt Persistin   Chloramphenicol Goody's Phenylbutazone   Chloromycetin Haltrain Piroxlcam   Clmetidine heparin Plaquenil   Cllnoril Hyco-pap Ponstel   Clofibrate Hydroxy chloroquine Propoxyphen         Before stopping any of these medications, be sure to consult the physician who ordered them.  Some, such as Coumadin (Warfarin) are ordered to prevent or treat serious conditions such as "deep thrombosis", "pumonary embolisms", and other heart problems.  The amount of time that you may need off of the medication may also vary with the medication and the reason for which you were taking it.  If you are taking any of these medications, please make sure you notify your pain physician before you undergo any  procedures.         Pain Management Discharge Instructions  General Discharge Instructions :  If you need to reach your doctor call: Monday-Friday 8:00 am - 4:00 pm at [phone] or toll free [phone].  After clinic hours [phone] to have operator reach doctor.  Bring all of your medication bottles to all your appointments in the pain clinic.  To cancel or reschedule your appointment with Pain Management please remember to call 24 hours in advance to avoid a fee.  Refer to the educational materials which you have been given on: General Risks, I had my Procedure. Discharge Instructions, Post Sedation.  Post Procedure Instructions:  The drugs you were given will stay in your system until tomorrow, so for the next 24 hours you should   not drive, make any legal decisions or drink any alcoholic beverages.  You may eat anything you prefer, but it is better to start with liquids then soups and crackers, and gradually work up to solid foods.  Please notify your doctor immediately if you have any unusual bleeding, trouble breathing or pain that is not related to your normal pain.  Depending on the type of procedure that was done, some parts of your body may feel week and/or numb.  This usually clears up by tonight or the next day.  Walk with the use of an assistive device or accompanied by an adult for the 24 hours.  You may use ice on the affected area for the first 24 hours.  Put ice in a Ziploc bag and cover with a towel and place against area 15 minutes on 15 minutes off.  You may switch to heat after 24 hours.

## 2015-09-04 NOTE — Progress Notes (Signed)
Safety precautions to be maintained throughout the outpatient stay will include: orient to surroundings, keep bed in low position, maintain call bell within reach at all times, provide assistance with transfer out of bed and ambulation.  

## 2015-09-05 ENCOUNTER — Telehealth: Payer: Self-pay | Admitting: *Deleted

## 2015-09-05 NOTE — Telephone Encounter (Signed)
Left voicemail with patient to call our office if she has any question or concerns re; procedure on yesterday.

## 2015-09-05 NOTE — Progress Notes (Signed)
Patient's Name: Regina Campos MRN: [id number] DOB: [birth year] DOS: 09/04/2015  Primary Reason(s) for Visit: Interventional Pain Management Treatment. CC: Back Pain   Pre-Procedure Assessment:  Regina Campos is a 65 y.o. year old, female patient, seen today for interventional treatment. She has Encounter for therapeutic drug level monitoring; Long term current use of opiate analgesic; Uncomplicated opioid dependence ([place]); Opiate use; Chronic neck pain; Chronic low back pain; Lumbar radicular pain; Lumbar spondylosis with radicular symptoms; Chronic pain syndrome; Disorder of shoulder; Pulmonary emphysema ([place]); Atypical migraine; Apnea, sleep; Displacement of lumbar intervertebral disc; High cholesterol; Hypothyroidism; History of cervical spinal surgery; Foraminal stenosis of cervical region; Arthropathy of shoulder region; Cervicogenic headache; Pain in joint involving right lower leg; Fibromyalgia; History of cardiac arrhythmia; Depression; Obesity; Failed back surgical syndrome; Spinal stenosis in cervical region; Chronic pain; Long term prescription opiate use; Myofascial pain; Muscle spasms of head or neck; Cervical spondylosis with radiculopathy (Right side); and 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 Her primarily concern today is the Back Pain   Today's Pain Score: 0-No pain Reported level of pain is compatible with clinical observation Pain Type: Chronic pain Pain Location: Back Pain Orientation: Lower Pain Descriptors / Indicators: Shooting, Aching Pain Frequency: Constant  Date of Last Visit: 07/17/15 Service Provided on Last Visit: Evaluation  Verification of the correct person, correct site (including marking of site), and correct procedure were performed and confirmed by the patient.  Today's Vitals   09/04/15 0948 09/04/15 0953 09/04/15 0958 09/04/15 1004  BP: 172/96 180/96 164/88 172/91  Pulse: 59 95 63 62  Temp:      Resp: '16 16  16 16  ' Height:      Weight:      SpO2: 94% 95% 95% 94%  PainSc:    0-No pain  PainLoc:      Calculated BMI: Body mass index is 36.61 kg/(m^2). Allergies: She has No Known Allergies.. Primary Diagnosis: Lumbar radicular pain [M54.16]  Procedure:  Type: Palliative Inter-Laminar Epidural Steroid Injection Region: Lumbar Level: L2-3 Level. Laterality: Right-Sided Paramedial  Indications: 1. Lumbar radicular pain   2. Lumbar spondylosis with radicular symptoms   3. 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  Consent: Secured. Under the influence of no sedatives a written informed consent was obtained, after having provided information on the risks and possible complications. To fulfill our ethical and legal obligations, as recommended by the American Medical Association's Code of Ethics, we have provided information to the patient about our clinical impression; the nature and purpose of the treatment or procedure; the risks, benefits, and possible complications of the intervention; alternatives; the risk(s) and benefit(s) of the alternative treatment(s) or procedure(s); and the risk(s) and benefit(s) of doing nothing. The patient was provided information about the risks and possible complications associated with the procedure. In the case of spinal procedures these may  include, but are not limited to, failure to achieve desired goals, infection, bleeding, organ or nerve damage,  allergic reactions, paralysis, and death. In addition, the patient was informed that Medicine is not an exact science; therefore, there is also the possibility of unforeseen risks and possible complications that may result in a catastrophic outcome. The patient indicated having understood very clearly. We have given the patient no guarantees and we have made no promises. Enough time was given to the patient to ask questions, all of which were answered to the patient's satisfaction.  Pre-Procedure Preparation: Safety Precautions: Allergies reviewed. Appropriate site, procedure, and patient were confirmed by following the Joint Commission's Universal Protocol (UP.01.01.01), in the form of a "Time Out". The patient was asked to confirm marked site and procedure, before commencing. The patient was asked about blood thinners, or active infections, both of which were denied. Patient was assessed for positional comfort and all pressure points were checked before starting procedure. Monitoring:  As per clinic protocol. Infection Control Precautions: Sterile technique used. Standard Universal Precautions were taken as recommended by the Department of [facility] for Disease Control and Prevention (CDC). Standard pre-surgical skin prep was conducted. Respiratory hygiene and cough etiquette was practiced. Hand hygiene observed. Safe injection practices and needle disposal techniques followed. SDV (single dose vial) medications used. Medications properly checked for expiration dates and contaminants. Personal protective equipment (PPE) used: Surgical mask. Sterile double glove technique. Radiation resistant gloves. Sterile surgical gloves.  Anesthesia, Analgesia, Anxiolysis: Type: Local Anesthesia Local Anesthetic(s): Lidocaine 1% Route: Subcutaneous IV Access: Declined. Sedation:  Declined. Indication(s):Not applicable.  Description of Procedure Process:  Time-out: "Time-out" completed before starting procedure, as per protocol. Position: Prone Target Area: For Epidural Steroid injections, the target area is the  interlaminar space, initially targeting the lower border of the superior vertebral body lamina. Approach: Posterior approach. Area Prepped: Entire Posterior Lumbosacral Region Prepping solution: ChloraPrep (2% chlorhexidine gluconate and 70% isopropyl alcohol) Safety Precautions: Aspiration looking for blood return was conducted prior to all injections. At no point did we inject any substances, as a needle was being advanced. No attempts were made at seeking any paresthesias. Safe injection practices and needle disposal techniques used. Medications properly checked for expiration dates. SDV (single dose vial) medications used. Description of the Procedure: Protocol guidelines were followed. The patient was placed in position over the fluoroscopy table. The target area was identified and the area prepped in the usual manner. Skin & deeper tissues infiltrated with local anesthetic. Appropriate amount of time allowed to pass for local anesthetics to take effect. The procedure needle was introduced through the skin, ipsilateral to the reported pain, and advanced to the target area. Bone was contacted and the needle walked caudad, until the lamina was cleared. The epidural space was identified using "loss-of-resistance technique" with 2-3 ml of PF-NaCl (0.9% NSS), in a 5cc LOR glass syringe. Proper needle placement secured. Negative aspiration confirmed. Solution injected in intermittent fashion, asking for systemic symptoms every 0.5cc of injectate. The needles were then removed and the area cleansed, making sure to leave some of the prepping solution back to take advantage of its long term bactericidal properties. The initial attempt was made at the L3-4 level but  unfortunately, once I get into the epidural space, the contrast showed a vascular pattern. Entrance into the space was also very difficult due to bony overgrowth at the interlaminar space. Entrance into the L2-3 level was much easier and I was able to get in on the first attempt. EBL: None Materials & Medications Used:  Needle(s) Used: 20g - 10cm, Tuohy-style epidural needle Medications  Administered today: We administered ropivacaine (PF) 2 mg/ml (0.2%), iohexol, sodium chloride, triamcinolone acetonide, lidocaine (PF), lidocaine (PF), and sodium chloride.Please see chart orders for dosing details.  Imaging Guidance:  Type of Imaging Technique: Fluoroscopy Guidance (Spinal) Indication(s): Assistance in needle guidance and placement for procedures requiring needle placement in or near specific anatomical locations not easily accessible without such assistance. Exposure Time: Please see nurses notes. Contrast: Before injecting any contrast, we confirmed that the patient did not have an allergy to iodine, shellfish, or radiological contrast. Once satisfactory needle placement was completed at the desired level, radiological contrast was injected. Injection was conducted under continuous fluoroscopic guidance. Injection of contrast accomplished without complications. See chart for type and volume of contrast used. Fluoroscopic Guidance: I was personally present in the fluoroscopy suite, where the patient was placed in position for the procedure, over the fluoroscopy-compatible table. Fluoroscopy was manipulated, using "Tunnel Vision Technique", to obtain the best possible view of the target area, on the affected side. Parallax error was corrected before commencing the procedure. A "direction-depth-direction" technique was used to introduce the needle under continuous pulsed fluoroscopic guidance. Once the target was reached, antero-posterior, oblique, and lateral fluoroscopic projection views were taken to  confirm needle placement in all planes. Permanently recorded images stored by scanning into EMR. Interpretation: Vascular pattern observed at the L3-4 level. Because of this, the needle was removed and the procedure repeated at the L2-3 level. Intraoperative imaging interpretation by performing Physician. Adequate needle placement confirmed. Adequate needle placement confirmed in AP, lateral, & Oblique Views. Appropriate spread of contrast to desired area. No evidence of afferent or efferent intravascular uptake. No intrathecal or subarachnoid spread observed. Permanent hardcopy images in multiple planes scanned into the patient's record.. 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  Antibiotics:  Type:  Antibiotics Given (last 72 hours)    None      Indication(s): No indications identified.  Post-operative Assessment:  Complications: No immediate post-treatment complications were observed. Relevant Post-operative Information:  Disposition: Return to clinic for follow-up evaluation. The patient tolerated the entire procedure well. A repeat set of vitals were taken after the procedure and the patient was kept under observation following institutional policy, for this procedure. Post-procedural neurological assessment was performed, showing return to baseline, prior to discharge. The patient was discharged home, once institutional criteria were met. The patient was provided with post-procedure discharge instructions, including a section on how to identify potential problems. Should any problems arise concerning this procedure, the patient was given instructions to immediately contact us, at any time, without hesitation. In any case, we plan to contact the patient by telephone for a follow-up status report regarding this interventional procedure. Comments:  No additional relevant information.  Primary Care Physician:  No primary care provider on file. Location: [facility] Note by: Shalon Councilman A. Dossie Arbour, M.D, DABA, DABAPM, DABPM, DABIPP, FIPP  Disclaimer:  Medicine is not an exact science. The only guarantee in medicine is that nothing is guaranteed. It is important to note that the decision to proceed with this intervention was based on the information collected from the patient. The Data and conclusions were drawn from the patient's questionnaire, the interview, and the physical examination. Because the information was provided in large part by the patient, it cannot be guaranteed that it has not been purposely or unconsciously manipulated. Every effort has been made to obtain as much relevant data as possible for  this evaluation. It is important to note that the conclusions that lead to this procedure are derived in large part from the available data. Always take into account that the treatment will also be dependent on availability of resources and existing treatment guidelines, considered by other Pain Management Practitioners as being common knowledge and practice, at the time of the intervention. For Medico-Legal purposes, it is also important to point out that variation in procedural techniques and pharmacological choices are the acceptable norm. The indications, contraindications, technique, and results of the above procedure should only be interpreted and judged by a Board-Certified Interventional Pain Specialist with extensive familiarity and expertise in the same exact procedure and technique. Attempts at providing opinions without similar or greater experience and expertise than that of the treating physician will be considered as inappropriate and unethical, and shall result in a formal complaint to the state medical board and applicable specialty societies.

## 2015-09-18 ENCOUNTER — Ambulatory Visit: Payer: Medicare Other | Attending: Pain Medicine | Admitting: Pain Medicine

## 2015-09-18 ENCOUNTER — Other Ambulatory Visit: Payer: Self-pay | Admitting: Pain Medicine

## 2015-09-18 ENCOUNTER — Encounter: Payer: Self-pay | Admitting: Pain Medicine

## 2015-09-18 VITALS — BP 131/63 | HR 65 | Temp 98.5°F | Resp 16 | Ht 64.0 in | Wt 232.0 lb

## 2015-09-18 DIAGNOSIS — F329 Major depressive disorder, single episode, unspecified: Secondary | ICD-10-CM | POA: Diagnosis not present

## 2015-09-18 DIAGNOSIS — E669 Obesity, unspecified: Secondary | ICD-10-CM | POA: Insufficient documentation

## 2015-09-18 DIAGNOSIS — E7800 Pure hypercholesterolemia, unspecified: Secondary | ICD-10-CM | POA: Insufficient documentation

## 2015-09-18 DIAGNOSIS — J439 Emphysema, unspecified: Secondary | ICD-10-CM | POA: Diagnosis not present

## 2015-09-18 DIAGNOSIS — E039 Hypothyroidism, unspecified: Secondary | ICD-10-CM | POA: Insufficient documentation

## 2015-09-18 DIAGNOSIS — Z79891 Long term (current) use of opiate analgesic: Secondary | ICD-10-CM

## 2015-09-18 DIAGNOSIS — M5126 Other intervertebral disc displacement, lumbar region: Secondary | ICD-10-CM | POA: Insufficient documentation

## 2015-09-18 DIAGNOSIS — F1120 Opioid dependence, uncomplicated: Secondary | ICD-10-CM | POA: Insufficient documentation

## 2015-09-18 DIAGNOSIS — M4802 Spinal stenosis, cervical region: Secondary | ICD-10-CM | POA: Diagnosis not present

## 2015-09-18 DIAGNOSIS — T402X5A Adverse effect of other opioids, initial encounter: Secondary | ICD-10-CM | POA: Insufficient documentation

## 2015-09-18 DIAGNOSIS — M549 Dorsalgia, unspecified: Secondary | ICD-10-CM | POA: Diagnosis present

## 2015-09-18 DIAGNOSIS — M542 Cervicalgia: Secondary | ICD-10-CM | POA: Diagnosis not present

## 2015-09-18 DIAGNOSIS — M12819 Other specific arthropathies, not elsewhere classified, unspecified shoulder: Secondary | ICD-10-CM | POA: Diagnosis not present

## 2015-09-18 DIAGNOSIS — M539 Dorsopathy, unspecified: Secondary | ICD-10-CM

## 2015-09-18 DIAGNOSIS — R51 Headache: Secondary | ICD-10-CM | POA: Diagnosis not present

## 2015-09-18 DIAGNOSIS — M5416 Radiculopathy, lumbar region: Secondary | ICD-10-CM | POA: Diagnosis not present

## 2015-09-18 DIAGNOSIS — Z5181 Encounter for therapeutic drug level monitoring: Secondary | ICD-10-CM

## 2015-09-18 DIAGNOSIS — M25572 Pain in left ankle and joints of left foot: Secondary | ICD-10-CM | POA: Diagnosis not present

## 2015-09-18 DIAGNOSIS — G8929 Other chronic pain: Secondary | ICD-10-CM | POA: Insufficient documentation

## 2015-09-18 DIAGNOSIS — M545 Low back pain: Secondary | ICD-10-CM | POA: Insufficient documentation

## 2015-09-18 DIAGNOSIS — K5903 Drug induced constipation: Secondary | ICD-10-CM | POA: Diagnosis not present

## 2015-09-18 DIAGNOSIS — M791 Myalgia: Secondary | ICD-10-CM

## 2015-09-18 DIAGNOSIS — M7918 Myalgia, other site: Secondary | ICD-10-CM

## 2015-09-18 DIAGNOSIS — Z981 Arthrodesis status: Secondary | ICD-10-CM | POA: Diagnosis not present

## 2015-09-18 DIAGNOSIS — M961 Postlaminectomy syndrome, not elsewhere classified: Secondary | ICD-10-CM

## 2015-09-18 DIAGNOSIS — M797 Fibromyalgia: Secondary | ICD-10-CM | POA: Insufficient documentation

## 2015-09-18 MED ORDER — TIZANIDINE HCL 4 MG PO CAPS: 4.0000 mg | ORAL_CAPSULE | Freq: Three times a day (TID) | ORAL | Status: DC | PRN

## 2015-09-18 MED ORDER — DOCUSATE SODIUM 100 MG PO CAPS: 200.0000 mg | ORAL_CAPSULE | Freq: Every evening | ORAL | Status: DC | PRN

## 2015-09-18 MED ORDER — MELOXICAM 15 MG PO TABS: 15.0000 mg | ORAL_TABLET | Freq: Every day | ORAL | Status: DC

## 2015-09-18 MED ORDER — KETOROLAC TROMETHAMINE 60 MG/2ML IM SOLN
INTRAMUSCULAR | Status: AC
  Administered 2015-09-18: 60 mg via INTRAMUSCULAR
  Filled 2015-09-18: qty 2

## 2015-09-18 MED ORDER — BISACODYL 5 MG PO TBEC: 10.0000 mg | DELAYED_RELEASE_TABLET | Freq: Every evening | ORAL | Status: DC | PRN

## 2015-09-18 MED ORDER — ORPHENADRINE CITRATE 30 MG/ML IJ SOLN
INTRAMUSCULAR | Status: AC
  Administered 2015-09-18: 14:00:00 via INTRAMUSCULAR
  Filled 2015-09-18: qty 2

## 2015-09-18 MED ORDER — BENEFIBER PO POWD
ORAL | Status: DC
Start: 2015-09-18 — End: 2016-02-11

## 2015-09-18 MED ORDER — TRAMADOL HCL 50 MG PO TABS: 100.0000 mg | ORAL_TABLET | Freq: Four times a day (QID) | ORAL | Status: DC | PRN

## 2015-09-18 MED ORDER — KETOROLAC TROMETHAMINE 60 MG/2ML IM SOLN
60.0000 mg | Freq: Once | INTRAMUSCULAR | Status: AC
  Administered 2015-09-18: 60 mg via INTRAMUSCULAR

## 2015-09-18 MED ORDER — ORPHENADRINE CITRATE 30 MG/ML IJ SOLN: 60.0000 mg | Freq: Once | INTRAMUSCULAR | Status: DC

## 2015-09-18 NOTE — Progress Notes (Signed)
Safety precautions to be maintained throughout the outpatient stay will include: orient to surroundings, keep bed in low position, maintain call bell within reach at all times, provide assistance with transfer out of bed and ambulation.  

## 2015-09-18 NOTE — Progress Notes (Signed)
Patient's Name: Regina Campos MRN: [id number] DOB: 02-Nov-1950 DOS: 09/18/2015  Primary Reason(s) for Visit: Evaluation of a New Problem, post-procedure evaluation, and medication management. CC: Back Pain   HPI  Ms. Veith is a 65 y.o. year old, female patient, who returns today as an established patient. She has Encounter for therapeutic drug level monitoring; Long term current use of opiate analgesic; Uncomplicated opioid dependence ([place]); Opiate use; Chronic neck pain; Chronic low back pain; Lumbar radicular pain; Lumbar spondylosis with radicular symptoms; Chronic pain syndrome; Disorder of shoulder; Pulmonary emphysema ([place]); Atypical migraine; Apnea, sleep; Displacement of lumbar intervertebral disc; High cholesterol; Hypothyroidism; History of cervical spinal surgery; Foraminal stenosis of cervical region; Arthropathy of shoulder region; Cervicogenic headache; Pain in joint involving right lower leg; Fibromyalgia; History of cardiac arrhythmia; Depression; Obesity; Failed back surgical syndrome; Spinal stenosis in cervical region; Chronic pain; Long term prescription opiate use; Myofascial pain; Muscle spasms of head or neck; Cervical spondylosis with radiculopathy (Right side); 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 Her primarily concern today is the Back Pain   The patient returns to the clinics today after having had a right sided L2-3 lumbar epidural steroid injection under fluoroscopic guidance, without sedation for her chronic intermittent low back and lower extremity pain. She was doing rather well after the procedure until she slipped and in an attempt to prevent falling, she twisted her back. The day after this happened, she then started to have return of her low back pain which she indicates that its different from what she normally has. She describes this low  back pain in the small of her back with the left being worst on the right in the low back pain were being worst on the lower extremity pain. However, the lower extremity pain seems to be worse on the right side when compared to the left and this right lower extremity pain starts in the lower back and it travels down the anterior portion of her thigh and then goes into the inner part of her thigh all the way down into the ankle. This pain appears to follow an L4 dermatomal distribution. More concerning is the fact that she began to experience some significant constipation after this episode which again she describes as unusual. She has also been experiencing frequency in urination, but no loss of bowel or bladder control. He had more significant is the fact that she has began to notice that when she turns her head she gets shooting pains into both of her legs with the right one being worst and going all the way down to her foot. This clearly would indicate a radiculopathy with a component of meningeal irritation and/or compression at the lumbar level.  Physical exam today reveals adequate strength (5/5) of both of her lower extremities. However, she did show some weakness in the right lower extremity upon attempting to get up from the chair. DTRs were +2 and equal bilaterally for the Achilles tendon, but +2 on the left patellar tendon and +1 on the right.  Because of her acute pain today, I have ordered a Toradol 60 mg IM injection plus Norflex 60 mg IM to help with her acute pain. In addition, in view of the clear evidence of a right sided L4 radiculopathy, I have ordered an MRI of the lumbar spine and I have tentatively added her to the schedule this next Thursday for a right-sided lumbar epidural  steroid injection under fluoroscopic guidance. I'll determine what level to go when, depending on the results of the MRI.  Reported Pain Score: 8  Reported level is compatible with observation Pain Type: Chronic  pain Pain Location: Back Pain Orientation: Lower Pain Descriptors / Indicators: Shooting, Pins and needles Pain Frequency: Constant  Date of Last Visit: 09/04/15 Service Provided on Last Visit: Procedure (LESI)  Pharmacotherapy  Medication(s): The patient is currently being managed with meloxicam 15 mg 1 tablet by mouth daily, Tysabri Dean 4 mg by mouth 3 times a day, and tramadol 50 mg 2 tablets by mouth every 6 hours when necessary for severe pain. The following evaluation is for the tramadol. Onset of action: Within expected pharmacological parameters Time to Peak effect: Timing and results are as within normal expected parameters Analgesic Effect: Less than 50%. Typically she gets more than 50%, but currently it is not holding her pain. Activity Facilitation: Medication(s) allow patient to sit, stand, walk, and do the basic ADLs Perceived Effectiveness: Described as relatively effective, allowing for increase in activities of daily living (ADL) Side-effects or Adverse reactions: None reported Duration of action: Within normal limits for medication [place] PMP: Compliant with practice rules and regulations UDS Results: Her last UDS done on 06/04/2015 came back within normal limits with no unexpected results. She remains compliant. UDS Interpretation: Patient appears to be compliant with practice rules and regulations Medication Assessment Form: Reviewed. Patient indicates being compliant with therapy Treatment compliance: Compliant Substance Use Disorder (SUD) Risk Level: Low Pharmacologic Plan: Continue therapy as is  Lab Work: Illicit Drugs No results found for: THCU, COCAINSCRNUR, PCPSCRNUR, MDMA, AMPHETMU, METHADONE, ETOH  Inflammation Markers Lab Results  Component Value Date   ESRSEDRATE 11 12/06/2012    Renal Function No results found for: BUN, CREATININE, GFRAA, GFRNONAA  Hepatic Function No results found for: AST, ALT, ALBUMIN  Electrolytes No results found for: NA,  K, CL, CALCIUM, MG  Post-Procedure Assessment  Procedure done on last visit: Right L2-3 lumbar epidural steroid injection under fluoroscopic guidance without sedation. Side-effects or Adverse reactions: None reported Sedation: No sedation used during procedure  Results: Ultra-Short Term Relief (First 1 hour after procedure): 100 %  Possibly the results is influenced by the pharmacodynamic effect of the local anesthetic, as well as that of the intravenous analgesics and/or sedatives, when used Short Term Relief (Initial 4-6 hrs after procedure): 100 % Short-term relief confirms injected site to be the source of pain Long Term Relief : 100 % (for 3-4 days, then 50% for a few days, then pain came back.) Long-term benefit would suggest an inflammatory etiology to the pain   Current Relief (Now):  0% , since she twisted her back and started having what appears to be no symptoms of a new L4 radiculopathy. In this case the recurrence of the pain is secondary to this event where she twisted her back and not necessarily because of failure of the procedure itself. Interpretation of Results: Except for the event where she twisted her back, the procedure seemed to have helped. However, having said that, she indicates that she had better results with the L3-4, right-sided, interlaminar epidural steroid injection plus right L4 transforaminal epidural steroid injection combination that we did on 06/12/2015. That one provided her with relatively good relief of the pain for almost 3 months.  Allergies  Ms. Laines has No Known Allergies.  Meds  The patient has a current medication list which includes the following prescription(s): albuterol, vitamin c, aspirin, budesonide-formoterol,  calcium carbonate-vitamin d, fenofibrate, furosemide, levothyroxine, lubiprostone, meloxicam, rosuvastatin, tizanidine, bisacodyl, docusate sodium, tramadol, and benefiber, and the following Facility-Administered Medications:  orphenadrine.  Current Outpatient Prescriptions on File Prior to Visit  Medication Sig  . albuterol (VOSPIRE ER) 4 MG 12 hr tablet 4 mg daily.  . Ascorbic Acid (VITAMIN C) 1000 MG tablet Take 1,000 mg by mouth daily.  Marland Kitchen aspirin 81 MG tablet Take 81 mg by mouth daily.  . budesonide-formoterol (SYMBICORT) 160-4.5 MCG/ACT inhaler Inhale 2 puffs into the lungs 2 (two) times daily.  . Calcium Carbonate-Vitamin D (CALCIUM 500 + D PO) Take 1 tablet by mouth daily.  . fenofibrate (TRICOR) 145 MG tablet Take 145 mg by mouth daily.  . furosemide (LASIX) 20 MG tablet Take 20 mg by mouth daily as needed.  Marland Kitchen levothyroxine (SYNTHROID, LEVOTHROID) 125 MCG tablet Take 125 mcg by mouth daily before breakfast.  . lubiprostone (AMITIZA) 24 MCG capsule Take 24 mcg by mouth 2 (two) times daily with a meal.  . rosuvastatin (CRESTOR) 10 MG tablet Take 10 mg by mouth daily.   No current facility-administered medications on file prior to visit.    ROS  Constitutional: Afebrile, no chills, well hydrated and well nourished Gastrointestinal: negative Musculoskeletal:negative Neurological: negative Behavioral/Psych: negative  PFSH  Medical:  Ms. Redding  has a past medical history of Hyperlipidemia; Arthritis; Thyroid disease; Hypothyroidism (06/04/2015); History of cervical spinal surgery (06/04/2015); and History of cardiac arrhythmia (06/04/2015). Family: family history includes Cancer in her mother; Heart disease in her father. Surgical:  has past surgical history that includes Abdominal hysterectomy; Total shoulder replacement; Replacement total knee; Appendectomy; Lumbar fusion; and Carpal tunnel release. Tobacco:  reports that she has never smoked. She does not have any smokeless tobacco history on file. Alcohol:  reports that she does not drink alcohol. Drug:  reports that she does not use illicit drugs.  Physical Exam  Vitals:  Today's Vitals   09/18/15 1308 09/18/15 1310  BP:  131/63  Pulse: 65    Temp: 98.5 F (36.9 C)   Resp: 16   Height: 5\' 4"  (1.626 m)   Weight: 232 lb (105.235 kg)   SpO2: 98%   PainSc: 8  8   PainLoc: Back     Calculated BMI: Body mass index is 39.8 kg/(m^2).  General appearance: alert, appears stated age, moderate distress and morbidly obese Eyes: PERLA Respiratory: No evidence respiratory distress, no audible rales or ronchi and no use of accessory muscles of respiration  Cervical Spine Inspection: Normal anatomy Alignment: Symetrical ROM: Adequate  Upper Extremities Inspection: No gross anomalies detected ROM: Adequate Sensory: Normal Motor: Unremarkable  Thoracic Spine Inspection: No gross anomalies detected Alignment: Symetrical ROM: Adequate Palpation: WNL  Lumbar Spine Inspection: No gross anomalies detected. Evidence of prior lumbar surgery in the form of a scar in the midline is present. Alignment: Symetrical ROM: Decreased significantly today. Palpation: Positive for tenderness to palpation and increase muscle tension over the lumbar region. Provocative Tests:  Lumbar Hyperextension and rotation test:  deferred due to pain Patrick's Maneuver: deferred due to pain Gait: Antalgic (limping)  Lower Extremities Inspection: No gross anomalies detected ROM: Adequate Sensory:  Normal, except for pain over the distribution of the L4 dermatome on the right leg. Motor: Unremarkable. 5/5 for all flexors and extensors of the lower extremity, except for  Toe walk (S1): Unable to do due to pain.  Heal walk (L5): Unable to do due to pain. Pulses: Palpable DTR:  Patellar (L4): WNL Achilles (S1):  WNL  Assessment & Plan  Primary Diagnosis & Pertinent Problem List: The primary encounter diagnosis was Acute right lumbar radiculopathy. Diagnoses of Chronic low back pain, Failed back surgical syndrome, Lumbar radicular pain, Chronic pain, Encounter for therapeutic drug level monitoring, Long term current use of opiate analgesic, Myofascial  pain, and Therapeutic opioid-induced constipation (OIC) were also pertinent to this visit.  Visit Diagnosis: 1. Acute right lumbar radiculopathy   2. Chronic low back pain   3. Failed back surgical syndrome   4. Lumbar radicular pain   5. Chronic pain   6. Encounter for therapeutic drug level monitoring   7. Long term current use of opiate analgesic   8. Myofascial pain   9. Therapeutic opioid-induced constipation (OIC)     Assessment: Acute right lumbar radiculopathy The patient seems to be having an acute right-sided L4 radiculopathy of new onset. The patient is being sent to have an MRI of the lumbar spine and we plan to see her back on Thursday for a possible repeat lumbar epidural steroid injection under fluoroscopic guidance. [facility]    Plan of Care  Pharmacotherapy (Medications Ordered): Meds ordered this encounter  Medications  . tiZANidine (ZANAFLEX) 4 MG capsule    Sig: Take 1 capsule (4 mg total) by mouth 3 (three) times daily as needed for muscle spasms.    Dispense:  90 capsule    Refill:  5    Do not place this medication, or any other prescription from our practice, on "Automatic Refill". Patient may have prescription filled one day early if pharmacy is closed on scheduled refill date.  . meloxicam (MOBIC) 15 MG tablet    Sig: Take 1 tablet (15 mg total) by mouth daily.    Dispense:  30 tablet    Refill:  5    Do not place this medication, or any other prescription from our practice, on "Automatic Refill". Patient may have prescription filled one day early if pharmacy is closed on scheduled refill date.  . traMADol (ULTRAM) 50 MG tablet    Sig: Take 2 tablets (100 mg total) by mouth every 6 (six) hours as needed for moderate pain or severe pain.    Dispense:  240 tablet    Refill:  5    Do not place this medication, or any other prescription from our practice, on "Automatic Refill". Patient may have prescription filled one day  early if pharmacy is closed on scheduled refill date. Do not fill until: 09/18/15 To last until: 03/13/16  . orphenadrine (NORFLEX) injection 60 mg    Sig:   . ketorolac (TORADOL) injection 60 mg    Sig:   . Wheat Dextrin (BENEFIBER) POWD    Sig: Stir 2 tsp. TID into 4-8 oz of any non-carbonated beverage or soft food (hot or cold)    Dispense:  500 g    Refill:  PRN    Do not place this medication, or any other prescription from our practice, on "Automatic Refill". Patient may have prescription filled one day early if pharmacy is closed on scheduled refill date.  . bisacodyl (DULCOLAX) 5 MG EC tablet    Sig: Take 2 tablets (10 mg total) by mouth at bedtime as needed for moderate constipation ((Hold for loose stool)).    Dispense:  100 tablet    Refill:  PRN    Do not place this medication, or any other prescription from our practice, on "Automatic Refill". Patient may have prescription filled  one day early if pharmacy is closed on scheduled refill date.  . docusate sodium (COLACE) 100 MG capsule    Sig: Take 2 capsules (200 mg total) by mouth at bedtime as needed for moderate constipation. Do not use longer than 7 days.    Dispense:  60 capsule    Refill:  PRN    Do not place this medication, or any other prescription from our practice, on "Automatic Refill". Patient may have prescription filled one day early if pharmacy is closed on scheduled refill date.  . orphenadrine (NORFLEX) 30 MG/ML injection    Sig:     TICE, KORI: cabinet override  . ketorolac (TORADOL) 60 MG/2ML injection    Sig:     TICE, KORI: cabinet override    Lab-work & Procedure Ordered: Orders Placed This Encounter  Procedures  . LUMBAR EPIDURAL STEROID INJECTION    Standing Status: Future     Number of Occurrences:      Standing Expiration Date: 09/17/2016    Scheduling Instructions:     Side: Right-sided (L3-4 vs L2-3)     Sedation: No Sedation.     Timeframe: ASAP    Order Specific Question:  Where will  this procedure be performed?    Answer:  [facility]  . MR Lumbar Spine Wo Contrast    Standing Status: Future     Number of Occurrences:      Standing Expiration Date: 09/17/2016    Scheduling Instructions:     Please provide canal diameter in millimeters when describing any spinal stenosis.    Order Specific Question:  Reason for Exam (SYMPTOM  OR DIAGNOSIS REQUIRED)    Answer:  Lumbar radiculopathy/radiculitis    Order Specific Question:  Preferred imaging location?    Answer:  [facility]    Order Specific Question:  Does the patient have a pacemaker or implanted devices?    Answer:  No    Order Specific Question:  What is the patient's sedation requirement?    Answer:  No Sedation    Order Specific Question:  Call Results- Best Contact Number?    Answer:  [id number] (Pain Clinic facility) (Dr. Dossie Arbour)    Imaging Ordered: MR LUMBAR SPINE WO CONTRAST  Interventional Therapies: Scheduled: Right-sided L2-3, versus L3-4 interlaminar epidural steroid injection under fluoroscopic guidance plus right-sided L4 transforaminal epidural steroid injection under fluoroscopic guidance, no IV sedation. PRN Procedures: None at this time    Referral(s) or Consult(s): Depending on the results of the MRI, we may have to consult a neurosurgeon or an orthopedic surgeon, specializing in spine.  Medications administered during this visit: We administered ketorolac, orphenadrine, and ketorolac.  Future Appointments Date Time Provider [place]  09/20/2015 8:20 AM Milinda Pointer, MD [facility] None  10/04/2015 9:30 AM [facility]  02/11/2016 10:20 AM Milinda Pointer, MD [facility] None    Primary Care Physician: No primary care provider on file. Location: [facility] Note by: Costella Schwarz A. Dossie Arbour, M.D, DABA, DABAPM, DABPM, DABIPP, FIPP

## 2015-09-18 NOTE — Patient Instructions (Signed)
GENERAL RISKS AND COMPLICATIONS  What are the risk, side effects and possible complications? Generally speaking, most procedures are safe.  However, with any procedure there are risks, side effects, and the possibility of complications.  The risks and complications are dependent upon the sites that are lesioned, or the type of nerve block to be performed.  The closer the procedure is to the spine, the more serious the risks are.  Great care is taken when placing the radio frequency needles, block needles or lesioning probes, but sometimes complications can occur. 1. Infection: Any time there is an injection through the skin, there is a risk of infection.  This is why sterile conditions are used for these blocks.  There are four possible types of infection. 1. Localized skin infection. 2. Central Nervous System Infection-This can be in the form of Meningitis, which can be deadly. 3. Epidural Infections-This can be in the form of an epidural abscess, which can cause pressure inside of the spine, causing compression of the spinal cord with subsequent paralysis. This would require an emergency surgery to decompress, and there are no guarantees that the patient would recover from the paralysis. 4. Discitis-This is an infection of the intervertebral discs.  It occurs in about 1% of discography procedures.  It is difficult to treat and it may lead to surgery.        2. Pain: the needles have to go through skin and soft tissues, will cause soreness.       3. Damage to internal structures:  The nerves to be lesioned may be near blood vessels or    other nerves which can be potentially damaged.       4. Bleeding: Bleeding is more common if the patient is taking blood thinners such as  aspirin, Coumadin, Ticiid, Plavix, etc., or if he/she have some genetic predisposition  such as hemophilia. Bleeding into the spinal canal can cause compression of the spinal  cord with subsequent paralysis.  This would require an  emergency surgery to  decompress and there are no guarantees that the patient would recover from the  paralysis.       5. Pneumothorax:  Puncturing of a lung is a possibility, every time a needle is introduced in  the area of the chest or upper back.  Pneumothorax refers to free air around the  collapsed lung(s), inside of the thoracic cavity (chest cavity).  Another two possible  complications related to a similar event would include: Hemothorax and Chylothorax.   These are variations of the Pneumothorax, where instead of air around the collapsed  lung(s), you may have blood or chyle, respectively.       6. Spinal headaches: They may occur with any procedures in the area of the spine.       7. Persistent CSF (Cerebro-Spinal Fluid) leakage: This is a rare problem, but may occur  with prolonged intrathecal or epidural catheters either due to the formation of a fistulous  track or a dural tear.       8. Nerve damage: By working so close to the spinal cord, there is always a possibility of  nerve damage, which could be as serious as a permanent spinal cord injury with  paralysis.       9. Death:  Although rare, severe deadly allergic reactions known as "Anaphylactic  reaction" can occur to any of the medications used.      10. Worsening of the symptoms:  We can always make thing worse.    What are the chances of something like this happening? Chances of any of this occuring are extremely low.  By statistics, you have more of a chance of getting killed in a motor vehicle accident: while driving to the hospital than any of the above occurring .  Nevertheless, you should be aware that they are possibilities.  In general, it is similar to taking a shower.  Everybody knows that you can slip, hit your head and get killed.  Does that mean that you should not shower again?  Nevertheless always keep in mind that statistics do not mean anything if you happen to be on the wrong side of them.  Even if a procedure has a 1  (one) in a 1,000,000 (million) chance of going wrong, it you happen to be that one..Also, keep in mind that by statistics, you have more of a chance of having something go wrong when taking medications.  Who should not have this procedure? If you are on a blood thinning medication (e.g. Coumadin, Plavix, see list of "Blood Thinners"), or if you have an active infection going on, you should not have the procedure.  If you are taking any blood thinners, please inform your physician.  How should I prepare for this procedure?  Do not eat or drink anything at least six hours prior to the procedure.  Bring a driver with you .  It cannot be a taxi.  Come accompanied by an adult that can drive you back, and that is strong enough to help you if your legs get weak or numb from the local anesthetic.  Take all of your medicines the morning of the procedure with just enough water to swallow them.  If you have diabetes, make sure that you are scheduled to have your procedure done first thing in the morning, whenever possible.  If you have diabetes, take only half of your insulin dose and notify our nurse that you have done so as soon as you arrive at the clinic.  If you are diabetic, but only take blood sugar pills (oral hypoglycemic), then do not take them on the morning of your procedure.  You may take them after you have had the procedure.  Do not take aspirin or any aspirin-containing medications, at least eleven (11) days prior to the procedure.  They may prolong bleeding.  Wear loose fitting clothing that may be easy to take off and that you would not mind if it got stained with Betadine or blood.  Do not wear any jewelry or perfume  Remove any nail coloring.  It will interfere with some of our monitoring equipment.  NOTE: Remember that this is not meant to be interpreted as a complete list of all possible complications.  Unforeseen problems may occur.  BLOOD THINNERS The following drugs  contain aspirin or other products, which can cause increased bleeding during surgery and should not be taken for 2 weeks prior to and 1 week after surgery.  If you should need take something for relief of minor pain, you may take acetaminophen which is found in Tylenol,m Datril, Anacin-3 and Panadol. It is not blood thinner. The products listed below are.  Do not take any of the products listed below in addition to any listed on your instruction sheet.  A.P.C or A.P.C with Codeine Codeine Phosphate Capsules #3 Ibuprofen Ridaura  ABC compound Congesprin Imuran rimadil  Advil Cope Indocin Robaxisal  Alka-Seltzer Effervescent Pain Reliever and Antacid Coricidin or Coricidin-D  Indomethacin Rufen    Alka-Seltzer plus Cold Medicine Cosprin Ketoprofen S-A-C Tablets  Anacin Analgesic Tablets or Capsules Coumadin Korlgesic Salflex  Anacin Extra Strength Analgesic tablets or capsules CP-2 Tablets Lanoril Salicylate  Anaprox Cuprimine Capsules Levenox Salocol  Anexsia-D Dalteparin Magan Salsalate  Anodynos Darvon compound Magnesium Salicylate Sine-off  Ansaid Dasin Capsules Magsal Sodium Salicylate  Anturane Depen Capsules Marnal Soma  APF Arthritis pain formula Dewitt's Pills Measurin Stanback  Argesic Dia-Gesic Meclofenamic Sulfinpyrazone  Arthritis Bayer Timed Release Aspirin Diclofenac Meclomen Sulindac  Arthritis pain formula Anacin Dicumarol Medipren Supac  Analgesic (Safety coated) Arthralgen Diffunasal Mefanamic Suprofen  Arthritis Strength Bufferin Dihydrocodeine Mepro Compound Suprol  Arthropan liquid Dopirydamole Methcarbomol with Aspirin Synalgos  ASA tablets/Enseals Disalcid Micrainin Tagament  Ascriptin Doan's Midol Talwin  Ascriptin A/D Dolene Mobidin Tanderil  Ascriptin Extra Strength Dolobid Moblgesic Ticlid  Ascriptin with Codeine Doloprin or Doloprin with Codeine Momentum Tolectin  Asperbuf Duoprin Mono-gesic Trendar  Aspergum Duradyne Motrin or Motrin IB Triminicin  Aspirin  plain, buffered or enteric coated Durasal Myochrisine Trigesic  Aspirin Suppositories Easprin Nalfon Trillsate  Aspirin with Codeine Ecotrin Regular or Extra Strength Naprosyn Uracel  Atromid-S Efficin Naproxen Ursinus  Auranofin Capsules Elmiron Neocylate Vanquish  Axotal Emagrin Norgesic Verin  Azathioprine Empirin or Empirin with Codeine Normiflo Vitamin E  Azolid Emprazil Nuprin Voltaren  Bayer Aspirin plain, buffered or children's or timed BC Tablets or powders Encaprin Orgaran Warfarin Sodium  Buff-a-Comp Enoxaparin Orudis Zorpin  Buff-a-Comp with Codeine Equegesic Os-Cal-Gesic   Buffaprin Excedrin plain, buffered or Extra Strength Oxalid   Bufferin Arthritis Strength Feldene Oxphenbutazone   Bufferin plain or Extra Strength Feldene Capsules Oxycodone with Aspirin   Bufferin with Codeine Fenoprofen Fenoprofen Pabalate or Pabalate-SF   Buffets II Flogesic Panagesic   Buffinol plain or Extra Strength Florinal or Florinal with Codeine Panwarfarin   Buf-Tabs Flurbiprofen Penicillamine   Butalbital Compound Four-way cold tablets Penicillin   Butazolidin Fragmin Pepto-Bismol   Carbenicillin Geminisyn Percodan   Carna Arthritis Reliever Geopen Persantine   Carprofen Gold's salt Persistin   Chloramphenicol Goody's Phenylbutazone   Chloromycetin Haltrain Piroxlcam   Clmetidine heparin Plaquenil   Cllnoril Hyco-pap Ponstel   Clofibrate Hydroxy chloroquine Propoxyphen         Before stopping any of these medications, be sure to consult the physician who ordered them.  Some, such as Coumadin (Warfarin) are ordered to prevent or treat serious conditions such as "deep thrombosis", "pumonary embolisms", and other heart problems.  The amount of time that you may need off of the medication may also vary with the medication and the reason for which you were taking it.  If you are taking any of these medications, please make sure you notify your pain physician before you undergo any  procedures.         Epidural Steroid Injection Patient Information  Description: The epidural space surrounds the nerves as they exit the spinal cord.  In some patients, the nerves can be compressed and inflamed by a bulging disc or a tight spinal canal (spinal stenosis).  By injecting steroids into the epidural space, we can bring irritated nerves into direct contact with a potentially helpful medication.  These steroids act directly on the irritated nerves and can reduce swelling and inflammation which often leads to decreased pain.  Epidural steroids may be injected anywhere along the spine and from the neck to the low back depending upon the location of your pain.   After numbing the skin with local anesthetic (like Novocaine), a small needle is passed   into the epidural space slowly.  You may experience a sensation of pressure while this is being done.  The entire block usually last less than 10 minutes.  Conditions which may be treated by epidural steroids:   Low back and leg pain  Neck and arm pain  Spinal stenosis  Post-laminectomy syndrome  Herpes zoster (shingles) pain  Pain from compression fractures  Preparation for the injection:  1. Do not eat any solid food or dairy products within 6 hours of your appointment.  2. You may drink clear liquids up to 2 hours before appointment.  Clear liquids include water, black coffee, juice or soda.  No milk or cream please. 3. You may take your regular medication, including pain medications, with a sip of water before your appointment  Diabetics should hold regular insulin (if taken separately) and take 1/2 normal NPH dos the morning of the procedure.  Carry some sugar containing items with you to your appointment. 4. A driver must accompany you and be prepared to drive you home after your procedure.  5. Bring all your current medications with your. 6. An IV may be inserted and sedation may be given at the discretion of the  physician.   7. A blood pressure cuff, EKG and other monitors will often be applied during the procedure.  Some patients may need to have extra oxygen administered for a short period. 8. You will be asked to provide medical information, including your allergies, prior to the procedure.  We must know immediately if you are taking blood thinners (like Coumadin/Warfarin)  Or if you are allergic to IV iodine contrast (dye). We must know if you could possible be pregnant.  Possible side-effects:  Bleeding from needle site  Infection (rare, may require surgery)  Nerve injury (rare)  Numbness & tingling (temporary)  Difficulty urinating (rare, temporary)  Spinal headache ( a headache worse with upright posture)  Light -headedness (temporary)  Pain at injection site (several days)  Decreased blood pressure (temporary)  Weakness in arm/leg (temporary)  Pressure sensation in back/neck (temporary)  Call if you experience:  Fever/chills associated with headache or increased back/neck pain.  Headache worsened by an upright position.  New onset weakness or numbness of an extremity below the injection site  Hives or difficulty breathing (go to the emergency room)  Inflammation or drainage at the infection site  Severe back/neck pain  Any new symptoms which are concerning to you  Please note:  Although the local anesthetic injected can often make your back or neck feel good for several hours after the injection, the pain will likely return.  It takes 3-7 days for steroids to work in the epidural space.  You may not notice any pain relief for at least that one week.  If effective, we will often do a series of three injections spaced 3-6 weeks apart to maximally decrease your pain.  After the initial series, we generally will wait several months before considering a repeat injection of the same type.  If you have any questions, please call [phone] [facility] 

## 2015-09-18 NOTE — Progress Notes (Signed)
Tramadol pill count # 61

## 2015-09-18 NOTE — Assessment & Plan Note (Signed)
The patient seems to be having an acute right-sided L4 radiculopathy of new onset. The patient is being sent to have an MRI of the lumbar spine and we plan to see her back on Thursday for a possible repeat lumbar epidural steroid injection under fluoroscopic guidance. [facility]

## 2015-09-20 ENCOUNTER — Ambulatory Visit: Payer: Medicare Other | Attending: Pain Medicine | Admitting: Pain Medicine

## 2015-09-20 ENCOUNTER — Encounter: Payer: Self-pay | Admitting: Pain Medicine

## 2015-09-20 VITALS — BP 177/80 | HR 66 | Temp 98.5°F | Resp 22 | Ht 64.0 in | Wt 232.0 lb

## 2015-09-20 DIAGNOSIS — F329 Major depressive disorder, single episode, unspecified: Secondary | ICD-10-CM | POA: Diagnosis not present

## 2015-09-20 DIAGNOSIS — M797 Fibromyalgia: Secondary | ICD-10-CM | POA: Insufficient documentation

## 2015-09-20 DIAGNOSIS — Z9889 Other specified postprocedural states: Secondary | ICD-10-CM | POA: Insufficient documentation

## 2015-09-20 DIAGNOSIS — M47896 Other spondylosis, lumbar region: Secondary | ICD-10-CM | POA: Diagnosis not present

## 2015-09-20 DIAGNOSIS — M5126 Other intervertebral disc displacement, lumbar region: Secondary | ICD-10-CM | POA: Insufficient documentation

## 2015-09-20 DIAGNOSIS — M539 Dorsopathy, unspecified: Secondary | ICD-10-CM | POA: Diagnosis not present

## 2015-09-20 DIAGNOSIS — G43809 Other migraine, not intractable, without status migrainosus: Secondary | ICD-10-CM | POA: Diagnosis not present

## 2015-09-20 DIAGNOSIS — M961 Postlaminectomy syndrome, not elsewhere classified: Secondary | ICD-10-CM

## 2015-09-20 DIAGNOSIS — M542 Cervicalgia: Secondary | ICD-10-CM | POA: Diagnosis not present

## 2015-09-20 DIAGNOSIS — F1120 Opioid dependence, uncomplicated: Secondary | ICD-10-CM | POA: Insufficient documentation

## 2015-09-20 DIAGNOSIS — M5416 Radiculopathy, lumbar region: Secondary | ICD-10-CM

## 2015-09-20 DIAGNOSIS — M549 Dorsalgia, unspecified: Secondary | ICD-10-CM | POA: Diagnosis present

## 2015-09-20 DIAGNOSIS — G8929 Other chronic pain: Secondary | ICD-10-CM | POA: Diagnosis not present

## 2015-09-20 DIAGNOSIS — G4730 Sleep apnea, unspecified: Secondary | ICD-10-CM | POA: Diagnosis not present

## 2015-09-20 DIAGNOSIS — R51 Headache: Secondary | ICD-10-CM | POA: Diagnosis not present

## 2015-09-20 DIAGNOSIS — E039 Hypothyroidism, unspecified: Secondary | ICD-10-CM | POA: Insufficient documentation

## 2015-09-20 DIAGNOSIS — M62838 Other muscle spasm: Secondary | ICD-10-CM | POA: Diagnosis not present

## 2015-09-20 DIAGNOSIS — E669 Obesity, unspecified: Secondary | ICD-10-CM | POA: Insufficient documentation

## 2015-09-20 DIAGNOSIS — E7800 Pure hypercholesterolemia, unspecified: Secondary | ICD-10-CM | POA: Insufficient documentation

## 2015-09-20 DIAGNOSIS — J439 Emphysema, unspecified: Secondary | ICD-10-CM | POA: Insufficient documentation

## 2015-09-20 DIAGNOSIS — Z981 Arthrodesis status: Secondary | ICD-10-CM | POA: Insufficient documentation

## 2015-09-20 DIAGNOSIS — M4802 Spinal stenosis, cervical region: Secondary | ICD-10-CM | POA: Insufficient documentation

## 2015-09-20 DIAGNOSIS — M545 Low back pain: Secondary | ICD-10-CM | POA: Insufficient documentation

## 2015-09-20 MED ORDER — IOHEXOL 180 MG/ML  SOLN
10.0000 mL | Freq: Once | INTRAMUSCULAR | Status: AC | PRN
  Administered 2015-09-20: 10:00:00 via EPIDURAL

## 2015-09-20 MED ORDER — TRIAMCINOLONE ACETONIDE 40 MG/ML IJ SUSP
INTRAMUSCULAR | Status: AC
  Filled 2015-09-20: qty 1

## 2015-09-20 MED ORDER — LIDOCAINE HCL (PF) 1 % IJ SOLN
INTRAMUSCULAR | Status: AC
  Filled 2015-09-20: qty 5

## 2015-09-20 MED ORDER — SODIUM CHLORIDE 0.9% FLUSH: 1.0000 mL | Freq: Once | INTRAVENOUS | Status: DC

## 2015-09-20 MED ORDER — TRIAMCINOLONE ACETONIDE 40 MG/ML IJ SUSP
40.0000 mg | Freq: Once | INTRAMUSCULAR | Status: AC
  Administered 2015-09-20: 10:00:00

## 2015-09-20 MED ORDER — LIDOCAINE HCL (PF) 1 % IJ SOLN
10.0000 mL | Freq: Once | INTRAMUSCULAR | Status: AC
  Administered 2015-09-20 (×2)

## 2015-09-20 MED ORDER — DEXAMETHASONE SODIUM PHOSPHATE 10 MG/ML IJ SOLN
10.0000 mg | Freq: Once | INTRAMUSCULAR | Status: AC
  Administered 2015-09-20: 10:00:00

## 2015-09-20 MED ORDER — ROPIVACAINE HCL 2 MG/ML IJ SOLN
INTRAMUSCULAR | Status: AC
  Filled 2015-09-20: qty 20

## 2015-09-20 MED ORDER — ROPIVACAINE HCL 2 MG/ML IJ SOLN
1.0000 mL | Freq: Once | INTRAMUSCULAR | Status: AC
  Administered 2015-09-20: 10:00:00 via EPIDURAL

## 2015-09-20 MED ORDER — DEXAMETHASONE SODIUM PHOSPHATE 10 MG/ML IJ SOLN
INTRAMUSCULAR | Status: AC
  Filled 2015-09-20: qty 1

## 2015-09-20 MED ORDER — ROPIVACAINE HCL 2 MG/ML IJ SOLN: 2.0000 mL | Freq: Once | INTRAMUSCULAR | Status: DC

## 2015-09-20 MED ORDER — IOHEXOL 180 MG/ML  SOLN
INTRAMUSCULAR | Status: AC
  Filled 2015-09-20: qty 20

## 2015-09-20 MED ORDER — SODIUM CHLORIDE 0.9% FLUSH: 2.0000 mL | Freq: Once | INTRAVENOUS | Status: DC

## 2015-09-20 MED ORDER — SODIUM CHLORIDE 0.9 % IJ SOLN
INTRAMUSCULAR | Status: AC
  Administered 2015-09-20: 10:00:00
  Filled 2015-09-20: qty 20

## 2015-09-20 NOTE — Progress Notes (Signed)
Patient's Name: Regina Campos MRN: [id number] DOB: [birth year] DOS: 09/20/2015  Primary Reason(s) for Visit: Interventional Pain Management Treatment. CC: Back Pain   Pre-Procedure Assessment:  Regina Campos is a 65 y.o. year old, female patient, seen today for interventional treatment. She has Encounter for therapeutic drug level monitoring; Long term current use of opiate analgesic; Uncomplicated opioid dependence ([place]); Opiate use; Chronic neck pain; Chronic low back pain; Lumbar radicular pain; Lumbar spondylosis with radicular symptoms; Chronic pain syndrome; Disorder of shoulder; Pulmonary emphysema ([place]); Atypical migraine; Apnea, sleep; Displacement of lumbar intervertebral disc; High cholesterol; Hypothyroidism; History of cervical spinal surgery; Foraminal stenosis of cervical region; Arthropathy of shoulder region; Cervicogenic headache; Pain in joint involving right lower leg; Fibromyalgia; History of cardiac arrhythmia; Depression; Obesity; Failed back surgical syndrome; Spinal stenosis in cervical region; Chronic pain; Long term prescription opiate use; Myofascial pain; Muscle spasms of head or neck; Cervical spondylosis with radiculopathy (Right side); 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 Her primarily concern today is the Back Pain   Today's Initial Pain Score: 6/10 Reported level of pain is compatible with clinical observation Pain Type: Chronic pain Pain Location: Back Pain Orientation: Lower Pain Descriptors / Indicators: Shooting, Pins and needles Pain Frequency: Constant  Post-procedure Pain Score: 0-No pain  Date of Last Visit: 09/18/15 Service Provided on Last Visit: Med Refill, Evaluation  Verification of the correct person, correct site (including marking of site), and correct procedure were performed and confirmed by the patient.  Today's  Vitals   09/20/15 1011 09/20/15 1015 09/20/15 1020 09/20/15 1025  BP: 187/95 172/84 172/79 177/80  Pulse: 66 65 65 66  Temp:      Resp: _0 Height:      Weight:      SpO2: 97% 97% 97% 97%  PainSc:    0-No pain  PainLoc:      Calculated BMI: Body mass index is 39.8 kg/(m^2). Allergies: She has No Known Allergies.. Primary Diagnosis: Acute right lumbar radiculopathy [M54.16]  Procedure # 1:  Type: Therapeutic Inter-Laminar Epidural Steroid Injection Region: Lumbar Level: L3-4 Level. Laterality: Right-Sided Paramedial  Procedure # 2: Type: Therapeutic Trans-Foraminal Epidural Steroid Injection Region: Lower Lumbar Level: L4-5 Nerve Root Level(s) Laterality: Right Paraspinal  Indications: 1. Acute right lumbar radiculopathy   2. Failed back surgical syndrome   3. 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  Consent: Secured. Under the influence of no sedatives a written informed consent was obtained, after having provided information on the risks and possible complications. To fulfill our ethical and legal obligations, as recommended by the American Medical Association's Code of Ethics, we have provided information to the patient about our clinical impression; the nature and purpose of the treatment or procedure; the risks,  benefits, and possible complications of the intervention; alternatives; the risk(s) and benefit(s) of the alternative treatment(s) or procedure(s); and the risk(s)  and benefit(s) of doing nothing. The patient was provided information about the risks and possible complications associated with the procedure. In the case of spinal procedures these may include, but are not limited to, failure to achieve desired goals, infection, bleeding, organ or nerve damage, allergic reactions, paralysis, and death. In addition, the patient was informed that Medicine is not an exact science; therefore, there is also the possibility of unforeseen risks and possible complications that may result in a catastrophic outcome. The patient indicated having understood very clearly. We have given the patient no guarantees and we have made no promises. Enough time was given to the patient to ask questions, all of which were answered to the patient's satisfaction.  Pre-Procedure Preparation: Safety Precautions: Allergies reviewed. Appropriate site, procedure, and patient were confirmed by following the Joint Commission's Universal Protocol (UP.01.01.01), in the form of a "Time Out". The patient was asked to confirm marked site and procedure, before commencing. The patient was asked about blood thinners, or active infections, both of which were denied. Patient was assessed for positional comfort and all pressure points were checked before starting procedure. Monitoring:  As per clinic protocol. Infection Control Precautions: Sterile technique used. Standard Universal Precautions were taken as recommended by the Department of [facility] for Disease Control and Prevention (CDC). Standard pre-surgical skin prep was conducted. Respiratory hygiene and cough etiquette was practiced. Hand hygiene observed. Safe injection practices and needle disposal techniques followed. SDV (single dose vial) medications used. Medications properly  checked for expiration dates and contaminants. Personal protective equipment (PPE) used: Surgical mask. Sterile double glove technique. Radiation resistant gloves. Sterile surgical gloves.  Anesthesia, Analgesia, Anxiolysis: Type: Local Anesthesia Local Anesthetic: Lidocaine 1% Route: Infiltration ([place]/IM) IV Access: Declined Sedation: Declined  Indication(s): Analgesia    Description of Procedure # 1 Process:  Time-out: "Time-out" completed before starting procedure, as per protocol. Position: Prone Target Area: For Epidural Steroid injections, the target area is the  interlaminar space, initially targeting the lower border of the superior vertebral body lamina. Approach: Posterior approach. Area Prepped: Entire Posterior Lumbosacral Region Prepping solution: ChloraPrep (2% chlorhexidine gluconate and 70% isopropyl alcohol) Safety Precautions: Aspiration looking for blood return was conducted prior to all injections. At no point did we inject any substances, as a needle was being advanced. No attempts were made at seeking any paresthesias. Safe injection practices and needle disposal techniques used. Medications properly checked for expiration dates. SDV (single dose vial) medications used.   Description of the Procedure: Protocol guidelines were followed. The patient was placed in position over the fluoroscopy table. The target area was identified and the area prepped in the usual manner. Skin desensitized using vapocoolant spray. Skin & deeper tissues infiltrated with local anesthetic. Appropriate amount of time allowed to pass for local anesthetics to take effect. The procedure needle was introduced through the skin, ipsilateral to the reported pain, and advanced to the target area. Bone was contacted and the needle walked caudad, until the lamina was cleared. The epidural space was identified using "loss-of-resistance technique" with 2-3 ml of PF-NaCl (0.9% NSS), in a 5cc LOR glass syringe.  Proper needle placement secured. Negative aspiration confirmed. Solution injected in intermittent fashion, asking for systemic symptoms every 0.5cc of injectate. The needles were then removed and the area cleansed, making sure to leave some of the prepping solution back to take advantage of its long term bactericidal properties. EBL: None Materials & Medications Used:  Needle(s) Used: 20g - 10cm, Tuohy-style epidural needle Medications Administered  today: We administered iohexol, lidocaine (PF), triamcinolone acetonide, dexamethasone, ropivacaine (PF) 2 mg/ml (0.2%), iohexol, sodium chloride, ropivacaine (PF) 2 mg/ml (0.2%), dexamethasone, triamcinolone acetonide, lidocaine (PF), and lidocaine (PF).Please see chart orders for dosing details.  Description of Procedure # 2 Process:  Time-out: "Time-out" completed before starting procedure, as per protocol. Position: Prone Target Area: For Epidural Steroid injection(s), the target area is the  interlaminar space, initially targeting the lower border of the superior vertebral body lamina. Approach: Trans foraminal approach. Area Prepped: Entire Lower Lumbar Area Prepping solution: ChloraPrep (2% chlorhexidine gluconate and 70% isopropyl alcohol) Safety Precautions: Aspiration looking for blood return was conducted prior to all injections. At no point did we inject any substances, as a needle was being advanced. No attempts were made at seeking any paresthesias. Safe injection practices and needle disposal techniques used. Medications properly checked for expiration dates. SDV (single dose vial) medications used.   Description of the Procedure: Protocol guidelines were followed. The patient was placed in position over the fluoroscopy table. The target area was identified and the area prepped in the usual manner. Skin desensitized using vapocoolant spray. Skin & deeper tissues infiltrated with local anesthetic. Appropriate amount of time allowed to pass for  local anesthetics to take effect. The procedure needles were then advanced to the target area. Proper needle placement secured. Negative aspiration confirmed. Solution injected in intermittent fashion, asking for systemic symptoms every 0.5cc of injectate. The needles were then removed and the area cleansed, making sure to leave some of the prepping solution back to take advantage of its long term bactericidal properties.  Imaging Guidance:  Type of Imaging Technique: Fluoroscopy Guidance (Spinal) Indication(s): Assistance in needle guidance and placement for procedures requiring needle placement in or near specific anatomical locations not easily accessible without such assistance. Exposure Time: Please see nurses notes. Contrast: Before injecting any contrast, we confirmed that the patient did not have an allergy to iodine, shellfish, or radiological contrast. Once satisfactory needle placement was completed at the desired level, radiological contrast was injected. Injection was conducted under continuous fluoroscopic guidance. Injection of contrast accomplished without complications. See chart for type and volume of contrast used. Fluoroscopic Guidance: I was personally present in the fluoroscopy suite, where the patient was placed in position for the procedure, over the fluoroscopy-compatible table. Fluoroscopy was manipulated, using "Tunnel Vision Technique", to obtain the best possible view of the target area, on the affected side. Parallax error was corrected before commencing the procedure. A "direction-depth-direction" technique was used to introduce the needle under continuous pulsed fluoroscopic guidance. Once the target was reached, antero-posterior, oblique, and lateral fluoroscopic projection views were taken to confirm needle placement in all planes. Permanently recorded images stored by scanning into EMR. Interpretation: Intraoperative imaging interpretation by performing Physician. Adequate  needle placement confirmed. Adequate needle placement confirmed in AP, lateral, & Oblique Views. Appropriate spread of contrast to desired area. No evidence of afferent or efferent intravascular uptake. Permanent hardcopy images in multiple planes scanned into the patient's record.  Antibiotics:  Type:  Antibiotics Given (last 72 hours)    None      Indication(s): No indications identified.  Post-operative Assessment:  Complications: No immediate post-treatment complications were observed. Disposition: Return to clinic for follow-up evaluation. The patient tolerated the entire procedure well. A repeat set of vitals were taken after the procedure and the patient was kept under observation following institutional policy, for this procedure. Post-procedural neurological assessment was performed, showing return to baseline, prior to discharge. The patient  was discharged home, once institutional criteria were met. The patient was provided with post-procedure discharge instructions, including a section on how to identify potential problems. Should any problems arise concerning this procedure, the patient was given instructions to immediately contact us, at any time, without hesitation. In any case, we plan to contact the patient by telephone for a follow-up status report regarding this interventional procedure. Comments:  No additional relevant information.  Primary Care Physician: No primary care provider on file. Location: [facility] Note by: Jachelle Fluty A. Dossie Arbour, M.D, DABA, DABAPM, DABPM, DABIPP, FIPP  Disclaimer:  Medicine is not an exact science. The only guarantee in medicine is that nothing is guaranteed. It is important to note that the decision to proceed with this intervention was based on the information collected from the patient. The Data and conclusions were drawn from the patient's questionnaire, the interview, and the physical examination. Because the  information was provided in large part by the patient, it cannot be guaranteed that it has not been purposely or unconsciously manipulated. Every effort has been made to obtain as much relevant data as possible for this evaluation. It is important to note that the conclusions that lead to this procedure are derived in large part from the available data. Always take into account that the treatment will also be dependent on availability of resources and existing treatment guidelines, considered by other Pain Management Practitioners as being common knowledge and practice, at the time of the intervention. For Medico-Legal purposes, it is also important to point out that variation in procedural techniques and pharmacological choices are the acceptable norm. The indications, contraindications, technique, and results of the above procedure should only be interpreted and judged by a Board-Certified Interventional Pain Specialist with extensive familiarity and expertise in the same exact procedure and technique. Attempts at providing opinions without similar or greater experience and expertise than that of the treating physician will be considered as inappropriate and unethical, and shall result in a formal complaint to the state medical board and applicable specialty societies.

## 2015-09-20 NOTE — Patient Instructions (Signed)
Epidural Steroid Injection Patient Information  Description: The epidural space surrounds the nerves as they exit the spinal cord.  In some patients, the nerves can be compressed and inflamed by a bulging disc or a tight spinal canal (spinal stenosis).  By injecting steroids into the epidural space, we can bring irritated nerves into direct contact with a potentially helpful medication.  These steroids act directly on the irritated nerves and can reduce swelling and inflammation which often leads to decreased pain.  Epidural steroids may be injected anywhere along the spine and from the neck to the low back depending upon the location of your pain.   After numbing the skin with local anesthetic (like Novocaine), a small needle is passed into the epidural space slowly.  You may experience a sensation of pressure while this is being done.  The entire block usually last less than 10 minutes.  Conditions which may be treated by epidural steroids:   Low back and leg pain  Neck and arm pain  Spinal stenosis  Post-laminectomy syndrome  Herpes zoster (shingles) pain  Pain from compression fractures  Preparation for the injection:  1. Do not eat any solid food or dairy products within 6 hours of your appointment.  2. You may drink clear liquids up to 2 hours before appointment.  Clear liquids include water, black coffee, juice or soda.  No milk or cream please. 3. You may take your regular medication, including pain medications, with a sip of water before your appointment  Diabetics should hold regular insulin (if taken separately) and take 1/2 normal NPH dos the morning of the procedure.  Carry some sugar containing items with you to your appointment. 4. A driver must accompany you and be prepared to drive you home after your procedure.  5. Bring all your current medications with your. 6. An IV may be inserted and sedation may be given at the discretion of the physician.   7. A blood pressure  cuff, EKG and other monitors will often be applied during the procedure.  Some patients may need to have extra oxygen administered for a short period. 8. You will be asked to provide medical information, including your allergies, prior to the procedure.  We must know immediately if you are taking blood thinners (like Coumadin/Warfarin)  Or if you are allergic to IV iodine contrast (dye). We must know if you could possible be pregnant.  Possible side-effects:  Bleeding from needle site  Infection (rare, may require surgery)  Nerve injury (rare)  Numbness & tingling (temporary)  Difficulty urinating (rare, temporary)  Spinal headache ( a headache worse with upright posture)  Light -headedness (temporary)  Pain at injection site (several days)  Decreased blood pressure (temporary)  Weakness in arm/leg (temporary)  Pressure sensation in back/neck (temporary)  Call if you experience:  Fever/chills associated with headache or increased back/neck pain.  Headache worsened by an upright position.  New onset weakness or numbness of an extremity below the injection site  Hives or difficulty breathing (go to the emergency room)  Inflammation or drainage at the infection site  Severe back/neck pain  Any new symptoms which are concerning to you  Please note:  Although the local anesthetic injected can often make your back or neck feel good for several hours after the injection, the pain will likely return.  It takes 3-7 days for steroids to work in the epidural space.  You may not notice any pain relief for at least that one week.    If effective, we will often do a series of three injections spaced 3-6 weeks apart to maximally decrease your pain.  After the initial series, we generally will wait several months before considering a repeat injection of the same type.  If you have any questions, please call [phone] [facility]  Discharge Instructions  General Discharge Instructions :  If you need to reach your doctor call: Monday-Friday 8:00 am - 4:00 pm at [phone] or toll free [phone].  After clinic hours [phone] to have operator reach doctor.  Bring all of your medication bottles to all your appointments in the pain clinic.  To cancel or reschedule your appointment with Pain Management please remember to call 24 hours in advance to avoid a fee.  Refer to the educational materials which you have been given on: General Risks, I had my Procedure. Discharge Instructions, Post Sedation.  Post Procedure Instructions:  The drugs you were given will stay in your system until tomorrow, so for the next 24 hours you should not drive, make any legal decisions or drink any alcoholic beverages.  You may eat anything you prefer, but it is better to start with liquids then soups and crackers, and gradually work up to solid foods.  Please notify your doctor immediately if you have any unusual bleeding, trouble breathing or pain that is not related to your normal pain.  Depending on the type of procedure that was done, some parts of your body may feel week and/or numb.  This usually clears up by tonight or the next day.  Walk with the use of an assistive device or accompanied by an adult for the 24 hours.  You may use ice on the affected area for the first 24 hours.  Put ice in a Ziploc bag and cover with a towel and place against area 15 minutes on 15 minutes off.  You may switch to heat after 24 hours. 

## 2015-09-20 NOTE — Progress Notes (Signed)
Safety precautions to be maintained throughout the outpatient stay will include: orient to surroundings, keep bed in low position, maintain call bell within reach at all times, provide assistance with transfer out of bed and ambulation.  

## 2015-09-21 ENCOUNTER — Telehealth: Payer: Self-pay | Admitting: *Deleted

## 2015-09-21 NOTE — Telephone Encounter (Signed)
Message left

## 2015-09-25 LAB — TOXASSURE SELECT 13 (MW), URINE: PDF: 0

## 2015-10-04 ENCOUNTER — Ambulatory Visit
Admission: RE | Admit: 2015-10-04 | Discharge: 2015-10-04 | Disposition: A | Discharge status: Home / Self Care | Payer: Medicare Other | Source: Ambulatory Visit | Attending: Pain Medicine | Admitting: Pain Medicine

## 2015-10-04 DIAGNOSIS — Z981 Arthrodesis status: Secondary | ICD-10-CM | POA: Insufficient documentation

## 2015-10-04 DIAGNOSIS — M4696 Unspecified inflammatory spondylopathy, lumbar region: Secondary | ICD-10-CM | POA: Insufficient documentation

## 2015-10-04 DIAGNOSIS — M5416 Radiculopathy, lumbar region: Secondary | ICD-10-CM | POA: Insufficient documentation

## 2015-10-04 DIAGNOSIS — M4726 Other spondylosis with radiculopathy, lumbar region: Secondary | ICD-10-CM | POA: Insufficient documentation

## 2015-10-04 DIAGNOSIS — M5116 Intervertebral disc disorders with radiculopathy, lumbar region: Secondary | ICD-10-CM | POA: Insufficient documentation

## 2015-10-09 ENCOUNTER — Encounter: Payer: Self-pay | Admitting: Pain Medicine

## 2015-10-09 ENCOUNTER — Ambulatory Visit: Payer: Medicare Other | Attending: Pain Medicine | Admitting: Pain Medicine

## 2015-10-09 VITALS — BP 107/53 | HR 77 | Temp 98.3°F | Resp 18 | Ht 65.0 in | Wt 230.0 lb

## 2015-10-09 DIAGNOSIS — M4806 Spinal stenosis, lumbar region: Secondary | ICD-10-CM

## 2015-10-09 DIAGNOSIS — M5416 Radiculopathy, lumbar region: Secondary | ICD-10-CM | POA: Diagnosis not present

## 2015-10-09 DIAGNOSIS — J439 Emphysema, unspecified: Secondary | ICD-10-CM | POA: Diagnosis not present

## 2015-10-09 DIAGNOSIS — F329 Major depressive disorder, single episode, unspecified: Secondary | ICD-10-CM | POA: Diagnosis not present

## 2015-10-09 DIAGNOSIS — M47896 Other spondylosis, lumbar region: Secondary | ICD-10-CM | POA: Insufficient documentation

## 2015-10-09 DIAGNOSIS — R51 Headache: Secondary | ICD-10-CM | POA: Insufficient documentation

## 2015-10-09 DIAGNOSIS — M545 Low back pain: Secondary | ICD-10-CM | POA: Insufficient documentation

## 2015-10-09 DIAGNOSIS — Z96619 Presence of unspecified artificial shoulder joint: Secondary | ICD-10-CM | POA: Diagnosis not present

## 2015-10-09 DIAGNOSIS — M4726 Other spondylosis with radiculopathy, lumbar region: Secondary | ICD-10-CM

## 2015-10-09 DIAGNOSIS — M4802 Spinal stenosis, cervical region: Secondary | ICD-10-CM | POA: Diagnosis not present

## 2015-10-09 DIAGNOSIS — E7800 Pure hypercholesterolemia, unspecified: Secondary | ICD-10-CM | POA: Diagnosis not present

## 2015-10-09 DIAGNOSIS — E039 Hypothyroidism, unspecified: Secondary | ICD-10-CM | POA: Insufficient documentation

## 2015-10-09 DIAGNOSIS — F1190 Opioid use, unspecified, uncomplicated: Secondary | ICD-10-CM

## 2015-10-09 DIAGNOSIS — E669 Obesity, unspecified: Secondary | ICD-10-CM | POA: Diagnosis not present

## 2015-10-09 DIAGNOSIS — Z79891 Long term (current) use of opiate analgesic: Secondary | ICD-10-CM | POA: Insufficient documentation

## 2015-10-09 DIAGNOSIS — G8929 Other chronic pain: Secondary | ICD-10-CM | POA: Diagnosis not present

## 2015-10-09 DIAGNOSIS — M5126 Other intervertebral disc displacement, lumbar region: Secondary | ICD-10-CM | POA: Insufficient documentation

## 2015-10-09 DIAGNOSIS — K5903 Drug induced constipation: Secondary | ICD-10-CM | POA: Diagnosis not present

## 2015-10-09 DIAGNOSIS — Z96659 Presence of unspecified artificial knee joint: Secondary | ICD-10-CM | POA: Insufficient documentation

## 2015-10-09 DIAGNOSIS — M48061 Spinal stenosis, lumbar region without neurogenic claudication: Secondary | ICD-10-CM | POA: Insufficient documentation

## 2015-10-09 DIAGNOSIS — M797 Fibromyalgia: Secondary | ICD-10-CM | POA: Diagnosis not present

## 2015-10-09 DIAGNOSIS — M549 Dorsalgia, unspecified: Secondary | ICD-10-CM | POA: Diagnosis present

## 2015-10-09 NOTE — Progress Notes (Signed)
Safety precautions to be maintained throughout the outpatient stay will include: orient to surroundings, keep bed in low position, maintain call bell within reach at all times, provide assistance with transfer out of bed and ambulation.  

## 2015-10-09 NOTE — Progress Notes (Signed)
Patient's Name: Regina Campos MRN: [id number] DOB: 09-Jan-1951 DOS: 10/09/2015  Primary Reason(s) for Visit: Post-Procedure evaluation CC: Back Pain   HPI  Regina Campos is a 65 y.o. year old, female patient, who returns today as an established patient. She has Encounter for therapeutic drug level monitoring; Long term current use of opiate analgesic; Uncomplicated opioid dependence ([place]); Opiate use (40 MME/Day); Chronic neck pain; Chronic low back pain; Lumbar radicular pain; Lumbar spondylosis with radicular symptoms; Chronic pain syndrome; Disorder of shoulder; Pulmonary emphysema ([place]); Atypical migraine; Apnea, sleep; Displacement of lumbar intervertebral disc; High cholesterol; Hypothyroidism; History of cervical spinal surgery; Foraminal stenosis of cervical region; Arthropathy of shoulder region; Cervicogenic headache; Pain in joint involving right lower leg; Fibromyalgia; History of cardiac arrhythmia; Depression; Obesity; Failed back surgical syndrome; Spinal stenosis in cervical region; Chronic pain; Long term prescription opiate use; Myofascial pain; Muscle spasms of head or neck; Cervical spondylosis with radiculopathy (Right side); 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 Her primarily concern today is the Back Pain   The patient returns to the clinics today after last time being seen on 09/20/2015 were she had a right sided L3-4 interlaminar epidural steroid injection plus right L4-5 transforaminal epidural steroid injection under fluoroscopic guidance and IV sedation.  Reported Pain Score: 8 , clinically she looks like a 2/10. Reported level is inconsistent with clinical obrservations. Pain Type: Chronic pain Pain Location: Back Pain Orientation: Lower Pain Descriptors / Indicators: Shooting, Pins and  needles Pain Frequency: Constant  Date of Last Visit: 09/20/15 Service Provided on Last Visit: Procedure (LESI)  Controlled Substance Pharmacotherapy Assessment  Analgesic: Tramadol 50 mg 2 tablets every 6 hours (400 mg/day) MME/day: 40 mg/day Pharmacokinetics: Onset of action (Liberation/Absorption): Within expected pharmacological parameters Time to Peak effect (Distribution): Timing and results are as within normal expected parameters Duration of action (Metabolism/Excretion): Within normal limits for medication Pharmacodynamics: Analgesic Effect: More than 50% Activity Facilitation: Medication(s) allow patient to sit, stand, walk, and do the basic ADLs Perceived Effectiveness: Described as relatively effective, allowing for increase in activities of daily living (ADL) Side-effects or Adverse reactions: None reported Monitoring: [place] PMP: Compliant with practice rules and regulations UDS Results/interpretation: The patient's last UDS was done on 09/18/2015 and it came back within normal limits with no unexpected results. Medication Assessment Form: Reviewed. Patient indicates being compliant with therapy Treatment compliance: Compliant Risk Assessment: Substance Use Disorder (SUD) Risk Level: Low Opioid Risk Tool (ORT) Score:    Pharmacologic Plan: Continue therapy as is  Lab Work: Illicit Drugs No results found for: THCU, COCAINSCRNUR, PCPSCRNUR, MDMA, AMPHETMU, METHADONE, ETOH  Inflammation Markers Lab Results  Component Value Date   ESRSEDRATE 11 12/06/2012    Renal Function No results found for: BUN, CREATININE, GFRAA, GFRNONAA  Hepatic Function No results found for: AST, ALT, ALBUMIN  Electrolytes No results found for: NA, K, CL, CALCIUM, MG   Post-Procedure Assessment  Procedure done on last visit: The patient returns to the clinics today after last time being seen on 09/20/2015 were she had a right sided L3-4 interlaminar epidural steroid injection plus right  L4-5 transforaminal epidural steroid injection under fluoroscopic guidance and IV sedation. Side-effects or Adverse reactions: None reported Sedation: No sedation used during procedure  Results: Ultra-Short Term Relief (First 1 hour after procedure): 100 %  Possibly the results is influenced by the pharmacodynamic effect of the local anesthetic,  as well as that of the intravenous analgesics and/or sedatives, when used Short Term Relief (Initial 4-6 hrs after procedure): 100 % Short-term relief confirms injected site to be the source of pain Long Term Relief : 100 % (for a few days then started coming back ) Long-term benefit would suggest an inflammatory etiology to the pain   Current Relief (Now):  0%  Recurrance of pain could suggest persistent aggravating factors Interpretation of Results: At this point, I believe that she would benefit from a neurosurgical evaluation.  Allergies  [name] has No Known Allergies.  Meds  The patient has a current medication list which includes the following prescription(s): albuterol, vitamin c, aspirin, bisacodyl, budesonide-formoterol, calcium carbonate-vitamin d, docusate sodium, fenofibrate, furosemide, levothyroxine, lubiprostone, meloxicam, rosuvastatin, tizanidine, tramadol, and benefiber.  Current Outpatient Prescriptions on File Prior to Visit  Medication Sig  . albuterol (VOSPIRE ER) 4 MG 12 hr tablet 4 mg daily.  . Ascorbic Acid (VITAMIN C) 1000 MG tablet Take 1,000 mg by mouth daily.  Marland Kitchen aspirin 81 MG tablet Take 81 mg by mouth daily.  . bisacodyl (DULCOLAX) 5 MG EC tablet Take 2 tablets (10 mg total) by mouth at bedtime as needed for moderate constipation ((Hold for loose stool)).  . budesonide-formoterol (SYMBICORT) 160-4.5 MCG/ACT inhaler Inhale 2 puffs into the lungs 2 (two) times daily.  . Calcium Carbonate-Vitamin D (CALCIUM 500 + D PO) Take 1 tablet by mouth daily.  Marland Kitchen docusate sodium (COLACE) 100 MG capsule Take 2 capsules (200 mg  total) by mouth at bedtime as needed for moderate constipation. Do not use longer than 7 days.  . fenofibrate (TRICOR) 145 MG tablet Take 145 mg by mouth daily.  . furosemide (LASIX) 20 MG tablet Take 20 mg by mouth daily as needed.  Marland Kitchen levothyroxine (SYNTHROID, LEVOTHROID) 125 MCG tablet Take 125 mcg by mouth daily before breakfast.  . lubiprostone (AMITIZA) 24 MCG capsule Take 24 mcg by mouth 2 (two) times daily with a meal.  . meloxicam (MOBIC) 15 MG tablet Take 1 tablet (15 mg total) by mouth daily.  . rosuvastatin (CRESTOR) 10 MG tablet Take 10 mg by mouth daily.  Marland Kitchen tiZANidine (ZANAFLEX) 4 MG capsule Take 1 capsule (4 mg total) by mouth 3 (three) times daily as needed for muscle spasms.  . traMADol (ULTRAM) 50 MG tablet Take 2 tablets (100 mg total) by mouth every 6 (six) hours as needed for moderate pain or severe pain.  . Wheat Dextrin (BENEFIBER) POWD Stir 2 tsp. TID into 4-8 oz of any non-carbonated beverage or soft food (hot or cold)   No current facility-administered medications on file prior to visit.    ROS  Constitutional: Afebrile, no chills, well hydrated and well nourished Gastrointestinal: negative Musculoskeletal:negative Neurological: negative Behavioral/Psych: negative  PFSH  Medical:  Ms. Lebourgeois  has a past medical history of Hyperlipidemia; Arthritis; Thyroid disease; Hypothyroidism (06/04/2015); History of cervical spinal surgery (06/04/2015); and History of cardiac arrhythmia (06/04/2015). Family: family history includes Cancer in her mother; Heart disease in her father. Surgical:  has past surgical history that includes Abdominal hysterectomy; Total shoulder replacement; Replacement total knee; Appendectomy; Lumbar fusion; and Carpal tunnel release. Tobacco:  reports that she has never smoked. She does not have any smokeless tobacco history on file. Alcohol:  reports that she does not drink alcohol. Drug:  reports that she does not use illicit drugs.  Physical  Exam  Vitals:  Today's Vitals   10/09/15 1324 10/09/15 1325  BP:  107/53  Pulse:  77  Temp:  98.3 F (36.8 C)  Resp:  18  Height: 5\' 5"  (1.651 m)   Weight: 230 lb (104.327 kg)   SpO2:  97%  PainSc: 8  8   PainLoc: Back     Calculated BMI: Body mass index is 38.27 kg/(m^2).  General appearance: alert, cooperative, appears stated age and mild distress Eyes: PERLA Respiratory: No evidence respiratory distress, no audible rales or ronchi and no use of accessory muscles of respiration  Cervical Spine Inspection: Normal anatomy Alignment: Symetrical ROM: Adequate  Upper Extremities Inspection: No gross anomalies detected ROM: Adequate Sensory: Normal Motor: Unremarkable  Thoracic Spine Inspection: No gross anomalies detected Alignment: Symetrical ROM: Adequate  Lumbar Spine Inspection: No gross anomalies detected Alignment: Symetrical ROM: Adequate  Gait: WNL  Lower Extremities Inspection: No gross anomalies detected ROM: Adequate Sensory:  Normal Motor: Unremarkable  Assessment & Plan  Primary Diagnosis & Pertinent Problem List: The primary encounter diagnosis was Acute right lumbar radiculopathy. 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  Visit Diagnosis: 1. Acute right lumbar radiculopathy   2. 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 Lumbar spondylosis with radicular symptoms   4. Opiate use (40 MME/Day)     Problem-specific Plan(s): No problem-specific assessment & plan notes found for this encounter.   Plan of Care  Pharmacotherapy (Medications Ordered): No orders of the defined types were placed in this encounter.    Lab-work & Procedure Ordered: Orders Placed This Encounter  Procedures  . Ambulatory referral to Neurosurgery    Referral Priority:  Routine    Referral Type:  Surgical     Referral Reason:  Specialty Services Required    Requested Specialty:  Neurosurgery    Number of Visits Requested:  1    Imaging Ordered: AMB REFERRAL TO NEUROSURGERY  Interventional Therapies: Scheduled: None at this point. PRN Procedures: None at this point.    Referral(s) or Consult(s): Neurosurgical evaluation for possible bilateral L3-4 neural foraminal decompression.  Medications administered during this visit: Ms. Auten had no medications administered during this visit.  Future Appointments Date Time Provider [place]  02/11/2016 10:20 AM Milinda Pointer, MD [facility] None    Primary Care Physician: PROVIDER NOT IN SYSTEM Location: [facility] Note by: Kathlen Brunswick. Dossie Arbour, M.D, DABA, DABAPM, DABPM, DABIPP, FIPP

## 2015-10-15 NOTE — Progress Notes (Signed)
Quick Note:  IMPRESSION: Progressive spondylosis at L3-4 where there is severe bilateral foraminal narrowing due to a combination of disc protrusions within the foramina and facet arthropathy. Foraminal narrowing appears worse on the right. The central canal is mildly narrowed at this level. The appearance of the lumbar spine is otherwise unchanged. Status post L4-5 discectomy and fusion. The central canal and foramina are widely patent.  Treatment options include: 1. Diagnostic bilateral L3-4 transforaminal epidural steroid injection under fluoroscopic guidance and IV sedation. 2. And/or neurosurgical consult for bilateral decompressive foraminotomy at the L3-4 level.  ______

## 2016-01-09 ENCOUNTER — Telehealth: Payer: Self-pay | Admitting: *Deleted

## 2016-01-09 NOTE — Telephone Encounter (Signed)
Returned pt call on vm made her aware of her appt 02/11/16 @ 10:20am...td

## 2016-02-11 ENCOUNTER — Ambulatory Visit: Payer: Medicare Other | Attending: Pain Medicine | Admitting: Pain Medicine

## 2016-02-11 ENCOUNTER — Encounter: Payer: Self-pay | Admitting: Pain Medicine

## 2016-02-11 ENCOUNTER — Encounter (INDEPENDENT_AMBULATORY_CARE_PROVIDER_SITE_OTHER): Payer: Self-pay

## 2016-02-11 VITALS — BP 132/66 | HR 70 | Temp 98.3°F | Resp 18 | Ht 65.0 in | Wt 220.0 lb

## 2016-02-11 DIAGNOSIS — M19011 Primary osteoarthritis, right shoulder: Secondary | ICD-10-CM | POA: Diagnosis not present

## 2016-02-11 DIAGNOSIS — M545 Low back pain, unspecified: Secondary | ICD-10-CM

## 2016-02-11 DIAGNOSIS — M12819 Other specific arthropathies, not elsewhere classified, unspecified shoulder: Secondary | ICD-10-CM | POA: Insufficient documentation

## 2016-02-11 DIAGNOSIS — G4730 Sleep apnea, unspecified: Secondary | ICD-10-CM | POA: Insufficient documentation

## 2016-02-11 DIAGNOSIS — Z79891 Long term (current) use of opiate analgesic: Secondary | ICD-10-CM

## 2016-02-11 DIAGNOSIS — M797 Fibromyalgia: Secondary | ICD-10-CM | POA: Diagnosis not present

## 2016-02-11 DIAGNOSIS — F1190 Opioid use, unspecified, uncomplicated: Secondary | ICD-10-CM | POA: Diagnosis not present

## 2016-02-11 DIAGNOSIS — M4722 Other spondylosis with radiculopathy, cervical region: Secondary | ICD-10-CM | POA: Insufficient documentation

## 2016-02-11 DIAGNOSIS — E785 Hyperlipidemia, unspecified: Secondary | ICD-10-CM | POA: Diagnosis not present

## 2016-02-11 DIAGNOSIS — G43809 Other migraine, not intractable, without status migrainosus: Secondary | ICD-10-CM | POA: Diagnosis not present

## 2016-02-11 DIAGNOSIS — Z5181 Encounter for therapeutic drug level monitoring: Secondary | ICD-10-CM

## 2016-02-11 DIAGNOSIS — R51 Headache: Secondary | ICD-10-CM | POA: Diagnosis not present

## 2016-02-11 DIAGNOSIS — G8929 Other chronic pain: Secondary | ICD-10-CM | POA: Diagnosis not present

## 2016-02-11 DIAGNOSIS — M4806 Spinal stenosis, lumbar region: Secondary | ICD-10-CM | POA: Diagnosis not present

## 2016-02-11 DIAGNOSIS — Z96619 Presence of unspecified artificial shoulder joint: Secondary | ICD-10-CM | POA: Diagnosis not present

## 2016-02-11 DIAGNOSIS — Z96651 Presence of right artificial knee joint: Secondary | ICD-10-CM | POA: Insufficient documentation

## 2016-02-11 DIAGNOSIS — M5416 Radiculopathy, lumbar region: Secondary | ICD-10-CM

## 2016-02-11 DIAGNOSIS — Z981 Arthrodesis status: Secondary | ICD-10-CM | POA: Insufficient documentation

## 2016-02-11 DIAGNOSIS — K5903 Drug induced constipation: Secondary | ICD-10-CM | POA: Diagnosis not present

## 2016-02-11 DIAGNOSIS — M5124 Other intervertebral disc displacement, thoracic region: Secondary | ICD-10-CM | POA: Diagnosis not present

## 2016-02-11 DIAGNOSIS — M961 Postlaminectomy syndrome, not elsewhere classified: Secondary | ICD-10-CM

## 2016-02-11 DIAGNOSIS — M5116 Intervertebral disc disorders with radiculopathy, lumbar region: Secondary | ICD-10-CM | POA: Insufficient documentation

## 2016-02-11 DIAGNOSIS — M129 Arthropathy, unspecified: Secondary | ICD-10-CM

## 2016-02-11 DIAGNOSIS — M19019 Primary osteoarthritis, unspecified shoulder: Secondary | ICD-10-CM

## 2016-02-11 DIAGNOSIS — M4726 Other spondylosis with radiculopathy, lumbar region: Secondary | ICD-10-CM

## 2016-02-11 DIAGNOSIS — M542 Cervicalgia: Secondary | ICD-10-CM | POA: Diagnosis present

## 2016-02-11 DIAGNOSIS — M25512 Pain in left shoulder: Secondary | ICD-10-CM

## 2016-02-11 DIAGNOSIS — M47896 Other spondylosis, lumbar region: Secondary | ICD-10-CM | POA: Diagnosis not present

## 2016-02-11 DIAGNOSIS — M4802 Spinal stenosis, cervical region: Secondary | ICD-10-CM | POA: Diagnosis not present

## 2016-02-11 DIAGNOSIS — M25511 Pain in right shoulder: Secondary | ICD-10-CM

## 2016-02-11 DIAGNOSIS — M5126 Other intervertebral disc displacement, lumbar region: Secondary | ICD-10-CM | POA: Diagnosis not present

## 2016-02-11 DIAGNOSIS — F329 Major depressive disorder, single episode, unspecified: Secondary | ICD-10-CM | POA: Diagnosis not present

## 2016-02-11 DIAGNOSIS — J439 Emphysema, unspecified: Secondary | ICD-10-CM | POA: Diagnosis not present

## 2016-02-11 DIAGNOSIS — M549 Dorsalgia, unspecified: Secondary | ICD-10-CM | POA: Diagnosis present

## 2016-02-11 DIAGNOSIS — E7800 Pure hypercholesterolemia, unspecified: Secondary | ICD-10-CM | POA: Diagnosis not present

## 2016-02-11 DIAGNOSIS — M47892 Other spondylosis, cervical region: Secondary | ICD-10-CM | POA: Diagnosis not present

## 2016-02-11 DIAGNOSIS — E039 Hypothyroidism, unspecified: Secondary | ICD-10-CM | POA: Diagnosis not present

## 2016-02-11 DIAGNOSIS — M25519 Pain in unspecified shoulder: Secondary | ICD-10-CM | POA: Diagnosis not present

## 2016-02-11 DIAGNOSIS — G4486 Cervicogenic headache: Secondary | ICD-10-CM

## 2016-02-11 DIAGNOSIS — T402X5A Adverse effect of other opioids, initial encounter: Secondary | ICD-10-CM

## 2016-02-11 DIAGNOSIS — M48061 Spinal stenosis, lumbar region without neurogenic claudication: Secondary | ICD-10-CM

## 2016-02-11 MED ORDER — TIZANIDINE HCL 4 MG PO CAPS: 4.0000 mg | ORAL_CAPSULE | Freq: Three times a day (TID) | ORAL | Status: DC | PRN

## 2016-02-11 MED ORDER — BISACODYL 5 MG PO TBEC: 10.0000 mg | DELAYED_RELEASE_TABLET | Freq: Every evening | ORAL | Status: DC | PRN

## 2016-02-11 MED ORDER — MELOXICAM 15 MG PO TABS: 15.0000 mg | ORAL_TABLET | Freq: Every day | ORAL | Status: DC

## 2016-02-11 MED ORDER — DOCUSATE SODIUM 100 MG PO CAPS: 200.0000 mg | ORAL_CAPSULE | Freq: Every evening | ORAL | Status: DC | PRN

## 2016-02-11 MED ORDER — TRAMADOL HCL 50 MG PO TABS: 100.0000 mg | ORAL_TABLET | Freq: Four times a day (QID) | ORAL | Status: DC | PRN

## 2016-02-11 MED ORDER — BENEFIBER PO POWD: ORAL | Status: DC

## 2016-02-11 NOTE — Patient Instructions (Signed)
Epidural Steroid Injection Patient Information  Description: The epidural space surrounds the nerves as they exit the spinal cord.  In some patients, the nerves can be compressed and inflamed by a bulging disc or a tight spinal canal (spinal stenosis).  By injecting steroids into the epidural space, we can bring irritated nerves into direct contact with a potentially helpful medication.  These steroids act directly on the irritated nerves and can reduce swelling and inflammation which often leads to decreased pain.  Epidural steroids may be injected anywhere along the spine and from the neck to the low back depending upon the location of your pain.   After numbing the skin with local anesthetic (like Novocaine), a small needle is passed into the epidural space slowly.  You may experience a sensation of pressure while this is being done.  The entire block usually last less than 10 minutes.  Conditions which may be treated by epidural steroids:   Low back and leg pain  Neck and arm pain  Spinal stenosis  Post-laminectomy syndrome  Herpes zoster (shingles) pain  Pain from compression fractures  Preparation for the injection:  1. Do not eat any solid food or dairy products within 8 hours of your appointment.  2. You may drink clear liquids up to 3 hours before appointment.  Clear liquids include water, black coffee, juice or soda.  No milk or cream please. 3. You may take your regular medication, including pain medications, with a sip of water before your appointment  Diabetics should hold regular insulin (if taken separately) and take 1/2 normal NPH dos the morning of the procedure.  Carry some sugar containing items with you to your appointment. 4. A driver must accompany you and be prepared to drive you home after your procedure.  5. Bring all your current medications with your. 6. An IV may be inserted and sedation may be given at the discretion of the physician.   7. A blood pressure  cuff, EKG and other monitors will often be applied during the procedure.  Some patients may need to have extra oxygen administered for a short period. 8. You will be asked to provide medical information, including your allergies, prior to the procedure.  We must know immediately if you are taking blood thinners (like Coumadin/Warfarin)  Or if you are allergic to IV iodine contrast (dye). We must know if you could possible be pregnant.  Possible side-effects:  Bleeding from needle site  Infection (rare, may require surgery)  Nerve injury (rare)  Numbness & tingling (temporary)  Difficulty urinating (rare, temporary)  Spinal headache ( a headache worse with upright posture)  Light -headedness (temporary)  Pain at injection site (several days)  Decreased blood pressure (temporary)  Weakness in arm/leg (temporary)  Pressure sensation in back/neck (temporary)  Call if you experience:  Fever/chills associated with headache or increased back/neck pain.  Headache worsened by an upright position.  New onset weakness or numbness of an extremity below the injection site  Hives or difficulty breathing (go to the emergency room)  Inflammation or drainage at the infection site  Severe back/neck pain  Any new symptoms which are concerning to you  Please note:  Although the local anesthetic injected can often make your back or neck feel good for several hours after the injection, the pain will likely return.  It takes 3-7 days for steroids to work in the epidural space.  You may not notice any pain relief for at least that one week.    If effective, we will often do a series of three injections spaced 3-6 weeks apart to maximally decrease your pain.  After the initial series, we generally will wait several months before considering a repeat injection of the same type.  If you have any questions, please call [phone] [facility]  [facility] Patient Information  Description: The facets are joints in the spine between the vertebrae.  Like any joints in the body, facets can become irritated and painful.  Arthritis can also effect the facets.  By injecting steroids and local anesthetic in and around these joints, we can temporarily block the nerve supply to them.  Steroids act directly on irritated nerves and tissues to reduce selling and inflammation which often leads to decreased pain.  Facet blocks may be done anywhere along the spine from the neck to the low back depending upon the location of your pain.   After numbing the skin with local anesthetic (like Novocaine), a small needle is passed onto the facet joints under x-ray guidance.  You may experience a sensation of pressure while this is being done.  The entire block usually lasts about 15-25 minutes.   Conditions which may be treated by facet blocks:   Low back/buttock pain  Neck/shoulder pain  Certain types of headaches  Preparation for the injection:  12. Do not eat any solid food or dairy products within 8 hours of your appointment. 13. You may drink clear liquid up to 3 hours before appointment.  Clear liquids include water, black coffee, juice or soda.  No milk or cream please. 14. You may take your regular medication, including pain medications, with a sip of water before your appointment.  Diabetics should hold regular insulin (if taken separately) and take 1/2 normal NPH dose the morning of the procedure.  Carry some sugar containing items with you to your appointment. 15. A driver must accompany you and be prepared to drive you home after your procedure. 22. Bring all your current medications with you. 17. An IV may be inserted and sedation may be given at the discretion of the physician. 18. A blood pressure cuff, EKG and other monitors will often be applied during the procedure.  Some patients may need to have extra oxygen administered for a short  period. 57. You will be asked to provide medical information, including your allergies and medications, prior to the procedure.  We must know immediately if you are taking blood thinners (like Coumadin/Warfarin) or if you are allergic to IV iodine contrast (dye).  We must know if you could possible be pregnant.  Possible side-effects:   Bleeding from needle site  Infection (rare, may require surgery)  Nerve injury (rare)  Numbness & tingling (temporary)  Difficulty urinating (rare, temporary)  Spinal headache (a headache worse with upright posture)  Light-headedness (temporary)  Pain at injection site (serveral days)  Decreased blood pressure (rare, temporary)  Weakness in arm/leg (temporary)  Pressure sensation in back/neck (temporary)   Call if you experience:   Fever/chills associated with headache or increased back/neck pain  Headache worsened by an upright position  New onset, weakness or numbness of an extremity below the injection site  Hives or difficulty breathing (go to the emergency room)  Inflammation or drainage at the injection site(s)  Severe back/neck pain greater than usual  New symptoms which are concerning to you  Please note:  Although the local anesthetic injected can often make your back or neck feel good for  several hours after the injection, the pain will likely return. It takes 3-7 days for steroids to work.  You may not notice any pain relief for at least one week.  If effective, we will often do a series of 2-3 injections spaced 3-6 weeks apart to maximally decrease your pain.  After the initial series, you may be a candidate for a more permanent nerve block of the facets.  If you have any questions, please call #[phone]) [facility]

## 2016-02-11 NOTE — Progress Notes (Signed)
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 Surgical:  has past surgical history that includes Abdominal hysterectomy; Total shoulder replacement; Replacement total knee; Appendectomy; Lumbar fusion; and Carpal tunnel release. Tobacco:  reports that she has never smoked. She does not have any smokeless tobacco history on file. Alcohol:  reports that she does not drink alcohol. Drug:  reports that she does not use illicit drugs.  Constitutional Exam  Vitals: Blood pressure 132/66, pulse 70, temperature 98.3 F (36.8 C), resp. rate 18, height '5\' 5"'  (1.651 m), weight 220 lb (99.791 kg), SpO2 98 %. General appearance: Well nourished, well developed, and  well hydrated. In no acute distress Calculated BMI/Body habitus: Body mass index is 36.61 kg/(m^2).       Psych/Mental status: Alert and oriented x  3 (person, place, & time) Eyes: PERLA Respiratory: No evidence of acute respiratory distress  Cervical Spine Exam  Inspection: No masses, redness, or swelling Alignment: Symmetrical ROM: Functional: ROM is within functional limits [facility]) Stability: No instability detected Muscle strength & Tone: Functionally intact Sensory: Unimpaired Palpation: No complaints of tenderness  Upper Extremity (UE) Exam    Side: Right upper extremity  Side: Left upper extremity  Inspection: No masses, redness, swelling, or asymmetry  Inspection: No masses, redness, swelling, or asymmetry  ROM:  ROM:  Functional: ROM is within functional limits [facility])  Functional: ROM is within functional limits [facility])  Muscle strength & Tone: Functionally intact  Muscle strength & Tone: Functionally intact  Sensory: Unimpaired  Sensory: Unimpaired  Palpation: Non-contributory  Palpation: Non-contributory   Thoracic Spine Exam  Inspection: No masses, redness, or swelling Alignment: Symmetrical ROM: Functional: ROM is within functional limits [facility]) Stability: No instability detected Sensory: Unimpaired Muscle strength & Tone: Functionally intact Palpation: No complaints of tenderness  Lumbar Spine Exam  Inspection: No masses, redness, or swelling Alignment: Symmetrical ROM: Functional: ROM is within functional limits [facility]) Stability: No instability detected Muscle strength & Tone: Functionally intact Sensory: Unimpaired Palpation: No complaints of tenderness Provocative Tests: Lumbar Hyperextension and rotation test: deferred Patrick's Maneuver: deferred  Gait & Posture Assessment  Ambulation: Unassisted Gait: Unaffected Posture: WNL  Lower Extremity Exam    Side: Right lower extremity  Side: Left lower extremity  Inspection: No masses, redness, swelling, or asymmetry ROM:  Inspection: No masses, redness, swelling, or asymmetry ROM:  Functional: ROM is within functional limits [facility])  Functional: ROM is  within functional limits [facility])  Muscle strength & Tone: Functionally intact  Muscle strength & Tone: Functionally intact  Sensory: Unimpaired  Sensory: Unimpaired  Palpation: Non-contributory  Palpation: Non-contributory   Assessment & Plan  Primary Diagnosis & Pertinent Problem List: The primary encounter diagnosis was Chronic pain. 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  Visit Diagnosis: 1. Chronic pain   2. Encounter for therapeutic drug level monitoring   3. Long term current use of opiate analgesic   4. Opiate use (40 MME/Day)   5. Therapeutic opioid-induced constipation (OIC)   6. 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 Lumbar spondylosis with radicular symptoms   8. Lumbar radicular pain   9. Spinal stenosis in cervical region   10. Arthropathy of shoulder region(3)   11. Chronic low back pain(2)   12. Cervicogenic headache   13. Chronic neck pain (1)   14. 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 Chronic shoulder pain, unspecified laterality     Problems updated and reviewed during this visit: Problem  Chronic shoulder pain (Location of Tertiary source of pain)  Chronic neck pain (Location of Primary Source of Pain)  Chronic low back pain (Location of Secondary source of pain)  Arthropathy of shoulder region (Location of Tertiary source of pain)  Problem-specific Plan(s): No problem-specific  assessment & plan notes found for this encounter.  No new assessment & plan notes have been filed under this hospital service since the last note was generated. Service: Pain Management   Plan of Care   Problem List Items Addressed This Visit      High   Arthropathy of shoulder region (Location of Tertiary source of pain) (Chronic)   Relevant Orders   SHOULDER INJECTION   SUPRASCAPULAR NERVE BLOCK   Cervicogenic headache (Chronic)   Relevant Medications   meloxicam (MOBIC) 15 MG tablet   traMADol (ULTRAM) 50 MG tablet   tiZANidine (ZANAFLEX) 4 MG capsule   Other Relevant Orders   CERVICAL FACET (MEDIAL BRANCH NERVE BLOCK)    Chronic low back pain (Location of Secondary source of pain) (Chronic)   Relevant Medications   meloxicam (MOBIC) 15 MG tablet   traMADol (ULTRAM) 50 MG tablet   tiZANidine (ZANAFLEX) 4 MG capsule   Other Relevant Orders   LUMBAR FACET(MEDIAL BRANCH NERVE BLOCK) MBNB   Chronic neck pain (Location of Primary Source of Pain) (Chronic)   Relevant Medications   meloxicam (MOBIC) 15 MG tablet   traMADol (ULTRAM) 50 MG tablet   tiZANidine (ZANAFLEX) 4 MG capsule   Other Relevant Orders   CERVICAL FACET (MEDIAL BRANCH NERVE BLOCK)    Chronic pain - Primary (Chronic)   Relevant Medications   meloxicam (MOBIC) 15 MG tablet   traMADol (ULTRAM) 50 MG tablet   tiZANidine (ZANAFLEX) 4 MG capsule   Chronic shoulder pain (Location of Tertiary source of pain) (Chronic)   Relevant Orders   SHOULDER INJECTION   SUPRASCAPULAR NERVE BLOCK   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)   Relevant Orders   CERVICAL EPIDURAL STEROID INJECTION   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) (Chronic)   Relevant Orders   Lumbar Transforaminal epidural without steroid   Lumbar radicular pain (Chronic)   Relevant Orders   LUMBAR EPIDURAL STEROID INJECTION   Lumbar Transforaminal epidural without steroid   Lumbar spondylosis with  radicular symptoms (Chronic)   Relevant Medications   meloxicam (MOBIC) 15 MG tablet   traMADol (ULTRAM) 50 MG tablet   tiZANidine (ZANAFLEX) 4 MG capsule   Other Relevant Orders   LUMBAR EPIDURAL STEROID INJECTION   Lumbar Transforaminal epidural without steroid   Spinal stenosis in cervical region (Chronic)   Relevant Orders   CERVICAL EPIDURAL STEROID INJECTION     Medium   Encounter for therapeutic drug level monitoring   Long term current use of opiate analgesic (Chronic)   Opiate use (40 MME/Day) (Chronic)   Therapeutic opioid-induced constipation (OIC) (Chronic)   Relevant Medications   bisacodyl (DULCOLAX) 5 MG EC tablet   docusate sodium (COLACE) 100 MG capsule   Wheat Dextrin (BENEFIBER) POWD       Pharmacotherapy (Medications Ordered): Meds ordered this encounter  Medications  . meloxicam (MOBIC) 15 MG tablet    Sig: Take 1 tablet (15 mg total) by mouth daily.    Dispense:  30 tablet    Refill:  5    Do not place this medication, or any other prescription from our practice, on "Automatic Refill". Patient may have prescription filled one day early if pharmacy is closed on scheduled refill date.  Marland Kitchen DISCONTD: tiZANidine (ZANAFLEX) 4 MG capsule    Sig: Take 1 capsule (4 mg total) by mouth 3 (three) times daily as needed for muscle spasms.    Dispense:  90 capsule  Refill:  5    Do not place this medication, or any other prescription from our practice, on "Automatic Refill". Patient may have prescription filled one day early if pharmacy is closed on scheduled refill date.  Marland Kitchen DISCONTD: traMADol (ULTRAM) 50 MG tablet    Sig: Take 2 tablets (100 mg total) by mouth every 6 (six) hours as needed for moderate pain or severe pain.    Dispense:  240 tablet    Refill:  5    Do not place this medication, or any other prescription from our practice, on "Automatic Refill". Patient may have prescription filled one day early if pharmacy is closed on scheduled refill date. Do not  fill until: 09/18/15 To last until: 03/13/16  . bisacodyl (DULCOLAX) 5 MG EC tablet    Sig: Take 2 tablets (10 mg total) by mouth at bedtime as needed for moderate constipation ((Hold for loose stool)).    Dispense:  100 tablet    Refill:  PRN    Do not place this medication, or any other prescription from our practice, on "Automatic Refill". Patient may have prescription filled one day early if pharmacy is closed on scheduled refill date.  . docusate sodium (COLACE) 100 MG capsule    Sig: Take 2 capsules (200 mg total) by mouth at bedtime as needed for moderate constipation. Do not use longer than 7 days.    Dispense:  60 capsule    Refill:  PRN    Do not place this medication, or any other prescription from our practice, on "Automatic Refill". Patient may have prescription filled one day early if pharmacy is closed on scheduled refill date.  . Wheat Dextrin (BENEFIBER) POWD    Sig: Stir 2 tsp. TID into 4-8 oz of any non-carbonated beverage or soft food (hot or cold)    Dispense:  500 g    Refill:  PRN    Do not place this medication, or any other prescription from our practice, on "Automatic Refill". Patient may have prescription filled one day early if pharmacy is closed on scheduled refill date.  . traMADol (ULTRAM) 50 MG tablet    Sig: Take 2 tablets (100 mg total) by mouth every 6 (six) hours as needed for moderate pain or severe pain.    Dispense:  240 tablet    Refill:  5    Do not place this medication, or any other prescription from our practice, on "Automatic Refill". Patient may have prescription filled one day early if pharmacy is closed on scheduled refill date. Do not fill until: 03/13/16  . tiZANidine (ZANAFLEX) 4 MG capsule    Sig: Take 1 capsule (4 mg total) by mouth 3 (three) times daily as needed for muscle spasms.    Dispense:  90 capsule    Refill:  5    Do not place this medication, or any other prescription from our practice, on "Automatic Refill". Patient may have  prescription filled one day early if pharmacy is closed on scheduled refill date.    Lab-work & Procedure Ordered: Orders Placed This Encounter  Procedures  . CERVICAL EPIDURAL STEROID INJECTION  . CERVICAL FACET (MEDIAL BRANCH NERVE BLOCK)   . LUMBAR EPIDURAL STEROID INJECTION  . Lumbar Transforaminal epidural without steroid  . LUMBAR FACET(MEDIAL BRANCH NERVE BLOCK) MBNB  . SHOULDER INJECTION  . SUPRASCAPULAR NERVE BLOCK    Imaging Ordered: None  Interventional Therapies: Scheduled:  None at this time.    Considering:   1. Palliative right-sided  L3-4 lumbar epidural steroid injection under fluoroscopic guidance, no sedation.  2. Palliative right-sided L4-5 transforaminal epidural steroid injection under fluoroscopic guidance, no sedation.  3. Palliative right-sided L2-3 lumbar epidural steroid injection under fluoroscopic guidance, no sedation.  4. Palliative right-sided C7-T1 cervical epidural steroid injection under fluoroscopic guidance, no sedation. 5. Diagnostic bilateral cervical facet block under fluoroscopic guidance and IV sedation.  6. Possible bilateral cervical facet radiofrequency ablation under fluoroscopic guidance and IV sedation.  7. Diagnostic bilateral lumbar facet block under fluoroscopic guidance and IV sedation.  8. Possible bilateral lumbar facet radiofrequency ablation under fluoroscopic guidance and IV sedation.  9. Diagnostic bilateral shoulder joint injection under fluoroscopic guidance and IV sedation.  10. Diagnostic bilateral suprascapular nerve block under fluoroscopic guidance and IV sedation.  11. Possible bilateral suprascapular nerve radiofrequency ablation under fluoroscopic guidance and IV sedation.    PRN Procedures:   1. Palliative right-sided L3-4 lumbar epidural steroid injection under fluoroscopic guidance, no sedation.  2. Palliative right-sided L4-5 transforaminal epidural steroid injection under fluoroscopic guidance, no sedation.   3. Palliative right-sided L2-3 lumbar epidural steroid injection under fluoroscopic guidance, no sedation.  4. Palliative right-sided C7-T1 cervical epidural steroid injection under fluoroscopic guidance, no sedation. 5. Diagnostic bilateral cervical facet block under fluoroscopic guidance and IV sedation.  6. Diagnostic bilateral lumbar facet block under fluoroscopic guidance and IV sedation.  7. Diagnostic bilateral shoulder joint injection under fluoroscopic guidance and IV sedation.  8. Diagnostic bilateral suprascapular nerve block under fluoroscopic guidance and IV sedation.    Referral(s) or Consult(s): None at this time.  New Prescriptions   No medications on file    Medications administered during this visit: Ms. Lusher had no medications administered during this visit.  Requested PM Follow-up: Return in about 6 months (around 08/12/2016) for Medication Management (6-Mo), Procedure (PRN - Patient will call).  Future Appointments Date Time Provider [place]  08/04/2016 9:00 AM Milinda Pointer, MD [facility] None    Primary Care Physician: PROVIDER NOT IN SYSTEM Location: [facility] Outpatient Pain Management Facility Note by: Kathlen Brunswick. Dossie Arbour, M.D, DABA, DABAPM, DABPM, DABIPP, FIPP  Pain Score Disclaimer: We use the NRS-11 scale. This is a self-reported, subjective measurement of pain severity with only modest accuracy. It is used primarily to identify changes within a particular patient. It must be understood that outpatient pain scales are significantly less accurate that those used for research, where they can be applied under ideal controlled circumstances with minimal exposure to variables. In reality, the score is likely to be a combination of pain intensity and pain affect, where pain affect describes the degree of emotional arousal or changes in action readiness caused by the sensory experience of pain. Factors such as social and work situation, setting,  emotional state, anxiety levels, expectation, and prior pain experience may influence pain perception and show large inter-individual differences that may also be affected by time variables.  Patient instructions provided during this appointment: Patient Instructions  Epidural Steroid Injection Patient Information  Description: The epidural space surrounds the nerves as they exit the spinal cord.  In some patients, the nerves can be compressed and inflamed by a bulging disc or a tight spinal canal (spinal stenosis).  By injecting steroids into the epidural space, we can bring irritated nerves into direct contact with a potentially helpful medication.  These steroids act directly on the irritated nerves and can reduce swelling and inflammation which often leads to decreased pain.  Epidural steroids may be injected anywhere along the spine and  from the neck to the low back depending upon the location of your pain.   After numbing the skin with local anesthetic (like Novocaine), a small needle is passed into the epidural space slowly.  You may experience a sensation of pressure while this is being done.  The entire block usually last less than 10 minutes.  Conditions which may be treated by epidural steroids:   Low back and leg pain  Neck and arm pain  Spinal stenosis  Post-laminectomy syndrome  Herpes zoster (shingles) pain  Pain from compression fractures  Preparation for the injection:  1. Do not eat any solid food or dairy products within 8 hours of your appointment.  2. You may drink clear liquids up to 3 hours before appointment.  Clear liquids include water, black coffee, juice or soda.  No milk or cream please. 3. You may take your regular medication, including pain medications, with a sip of water before your appointment  Diabetics should hold regular insulin (if taken separately) and take 1/2 normal NPH dos the morning of the procedure.  Carry some sugar containing items with you  to your appointment. 4. A driver must accompany you and be prepared to drive you home after your procedure.  5. Bring all your current medications with your. 6. An IV may be inserted and sedation may be given at the discretion of the physician.   7. A blood pressure cuff, EKG and other monitors will often be applied during the procedure.  Some patients may need to have extra oxygen administered for a short period. 8. You will be asked to provide medical information, including your allergies, prior to the procedure.  We must know immediately if you are taking blood thinners (like Coumadin/Warfarin)  Or if you are allergic to IV iodine contrast (dye). We must know if you could possible be pregnant.  Possible side-effects:  Bleeding from needle site  Infection (rare, may require surgery)  Nerve injury (rare)  Numbness & tingling (temporary)  Difficulty urinating (rare, temporary)  Spinal headache ( a headache worse with upright posture)  Light -headedness (temporary)  Pain at injection site (several days)  Decreased blood pressure (temporary)  Weakness in arm/leg (temporary)  Pressure sensation in back/neck (temporary)  Call if you experience:  Fever/chills associated with headache or increased back/neck pain.  Headache worsened by an upright position.  New onset weakness or numbness of an extremity below the injection site  Hives or difficulty breathing (go to the emergency room)  Inflammation or drainage at the infection site  Severe back/neck pain  Any new symptoms which are concerning to you  Please note:  Although the local anesthetic injected can often make your back or neck feel good for several hours after the injection, the pain will likely return.  It takes 3-7 days for steroids to work in the epidural space.  You may not notice any pain relief for at least that one week.  If effective, we will often do a series of three injections spaced 3-6 weeks apart to  maximally decrease your pain.  After the initial series, we generally will wait several months before considering a repeat injection of the same type.  If you have any questions, please call [phone] [facility] Patient Information  Description: The facets are joints in the spine between the vertebrae.  Like any joints in the body, facets can become irritated and painful.  Arthritis can also effect the facets.  By  injecting steroids and local anesthetic in and around these joints, we can temporarily block the nerve supply to them.  Steroids act directly on irritated nerves and tissues to reduce selling and inflammation which often leads to decreased pain.  Facet blocks may be done anywhere along the spine from the neck to the low back depending upon the location of your pain.   After numbing the skin with local anesthetic (like Novocaine), a small needle is passed onto the facet joints under x-ray guidance.  You may experience a sensation of pressure while this is being done.  The entire block usually lasts about 15-25 minutes.   Conditions which may be treated by facet blocks:   Low back/buttock pain  Neck/shoulder pain  Certain types of headaches  Preparation for the injection:  12. Do not eat any solid food or dairy products within 8 hours of your appointment. 13. You may drink clear liquid up to 3 hours before appointment.  Clear liquids include water, black coffee, juice or soda.  No milk or cream please. 14. You may take your regular medication, including pain medications, with a sip of water before your appointment.  Diabetics should hold regular insulin (if taken separately) and take 1/2 normal NPH dose the morning of the procedure.  Carry some sugar containing items with you to your appointment. 15. A driver must accompany you and be prepared to drive you home after your procedure. 18. Bring all your current medications with  you. 17. An IV may be inserted and sedation may be given at the discretion of the physician. 18. A blood pressure cuff, EKG and other monitors will often be applied during the procedure.  Some patients may need to have extra oxygen administered for a short period. 62. You will be asked to provide medical information, including your allergies and medications, prior to the procedure.  We must know immediately if you are taking blood thinners (like Coumadin/Warfarin) or if you are allergic to IV iodine contrast (dye).  We must know if you could possible be pregnant.  Possible side-effects:   Bleeding from needle site  Infection (rare, may require surgery)  Nerve injury (rare)  Numbness & tingling (temporary)  Difficulty urinating (rare, temporary)  Spinal headache (a headache worse with upright posture)  Light-headedness (temporary)  Pain at injection site (serveral days)  Decreased blood pressure (rare, temporary)  Weakness in arm/leg (temporary)  Pressure sensation in back/neck (temporary)   Call if you experience:   Fever/chills associated with headache or increased back/neck pain  Headache worsened by an upright position  New onset, weakness or numbness of an extremity below the injection site  Hives or difficulty breathing (go to the emergency room)  Inflammation or drainage at the injection site(s)  Severe back/neck pain greater than usual  New symptoms which are concerning to you  Please note:  Although the local anesthetic injected can often make your back or neck feel good for several hours after the injection, the pain will likely return. It takes 3-7 days for steroids to work.  You may not notice any pain relief for at least one week.  If effective, we will often do a series of 2-3 injections spaced 3-6 weeks apart to maximally decrease your pain.  After the initial series, you may be a candidate for a more permanent nerve block of the facets.  If you  have any questions, please call #[phone] [facility]

## 2016-02-11 NOTE — Progress Notes (Signed)
Safety precautions to be maintained throughout the outpatient stay will include: orient to surroundings, keep bed in low position, maintain call bell within reach at all times, provide assistance with transfer out of bed and ambulation. Tramadol pill count # 32/240  Filled 02-09-16

## 2016-04-26 ENCOUNTER — Emergency Department: Admission: EM | Admit: 2016-04-26 | Discharge: 2016-04-26 | Discharge status: Absent without leave

## 2016-07-16 DIAGNOSIS — E669 Obesity, unspecified: Secondary | ICD-10-CM | POA: Insufficient documentation

## 2016-07-16 DIAGNOSIS — E785 Hyperlipidemia, unspecified: Secondary | ICD-10-CM | POA: Insufficient documentation

## 2016-07-16 DIAGNOSIS — K5900 Constipation, unspecified: Secondary | ICD-10-CM | POA: Insufficient documentation

## 2016-07-21 DIAGNOSIS — I5189 Other ill-defined heart diseases: Secondary | ICD-10-CM | POA: Insufficient documentation

## 2016-07-22 ENCOUNTER — Telehealth: Payer: Self-pay | Admitting: Pain Medicine

## 2016-07-22 NOTE — Telephone Encounter (Signed)
Patient called to cancel 07-25-16 appt for meds, states she is having surgery on that day and will have enough meds until she can come, she will call in Jan 2018 to sched appt.

## 2016-07-25 ENCOUNTER — Encounter: Admitting: Pain Medicine

## 2016-07-25 HISTORY — PX: BACK SURGERY: SHX140

## 2016-07-29 IMAGING — MR MR LUMBAR SPINE W/O CM
4 of 5 series · 23 of 48 positions shown · non-contrast
Comparison: Plain films lumbar spine 11/04/2013 and MRI lumbar
spine 10/15/2012.

CLINICAL DATA: Low back and bilateral upper leg pain for 2 years,
worsened over the past year. History of prior lumbar surgery.

EXAM:
MRI LUMBAR SPINE WITHOUT CONTRAST
TECHNIQUE: Multiplanar, multisequence MR imaging of the lumbar spine was
performed. No intravenous contrast was administered.

[Series 2: T2 · sagittal · 4.0mm · 0.81mm/px · 6 of 17 slices shown (1 of 2)]
[im 1/17]
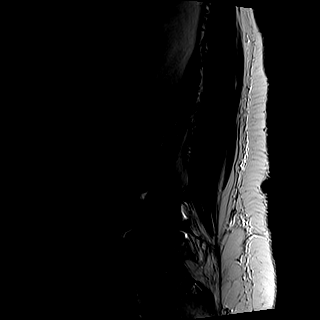
[im 4/17]
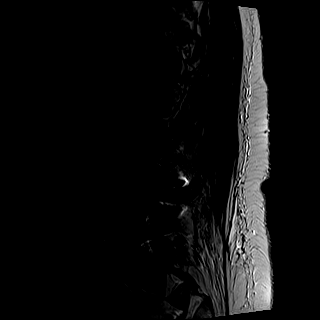
[im 7/17]
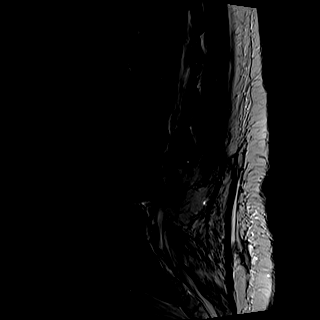
[im 10/17]
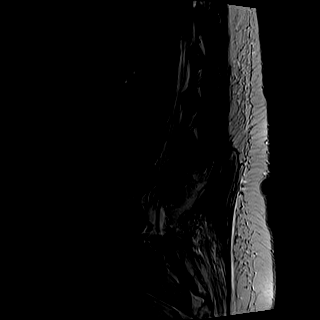
[im 13/17]
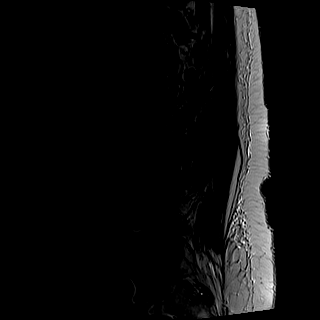
[im 17/17]
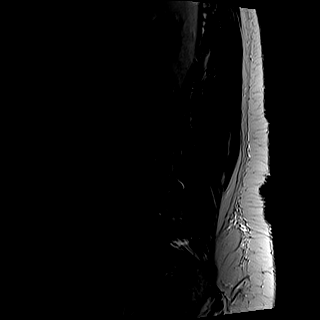

[Series 3: T1 · sagittal · 4.0mm · 0.41mm/px · 6 of 17 slices shown (1 of 2)]
[im 1/17]
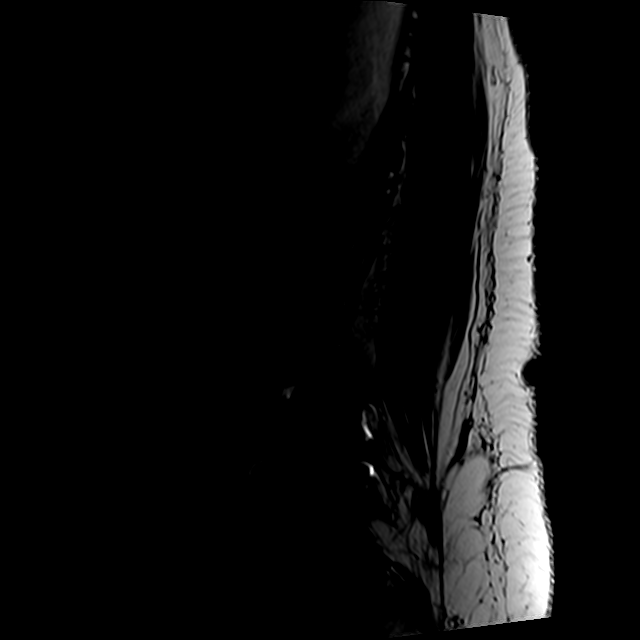
[im 3/17]
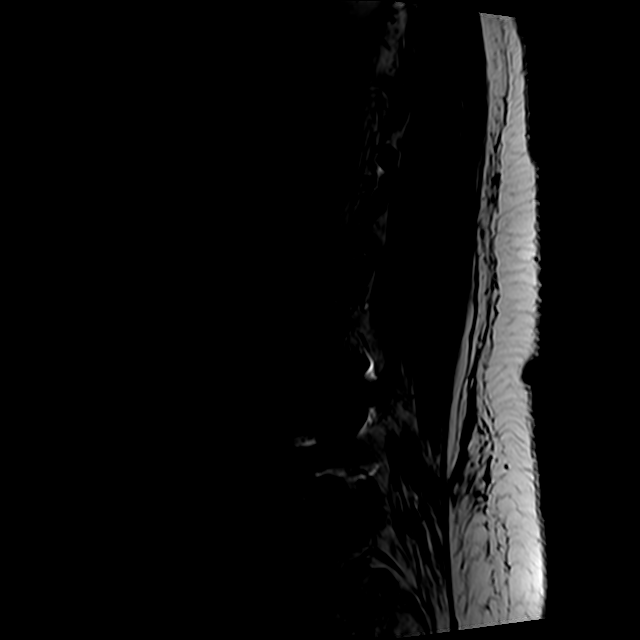
[im 6/17]
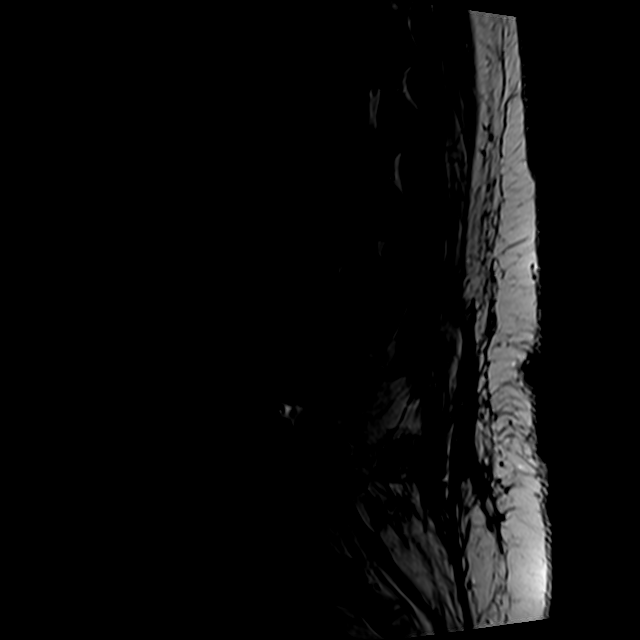
[im 9/17]
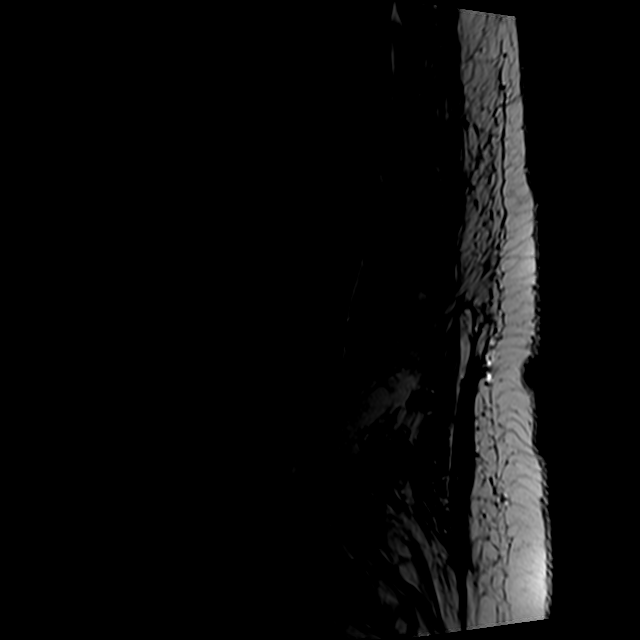
[im 11/17]
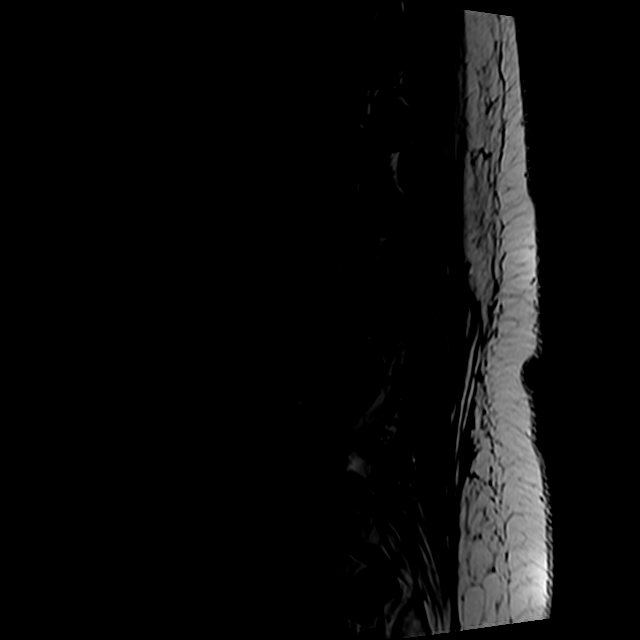
[im 14/17]
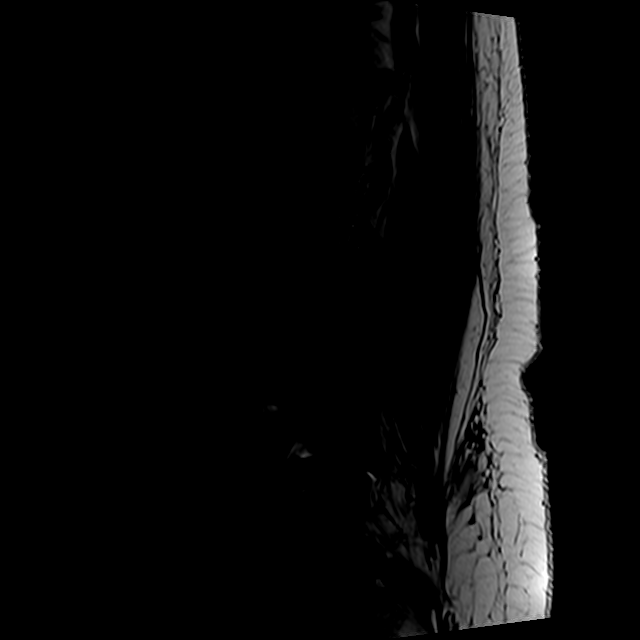

[Series 5: T2 · axial · 4.0mm · 0.78mm/px · z∈[-152,+49]mm · 8 of 36 slices shown (2 of 2)]
[im 1/36]
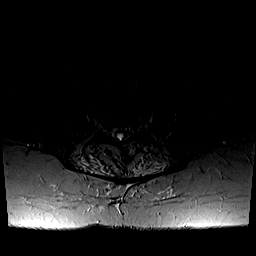
[im 6/36]
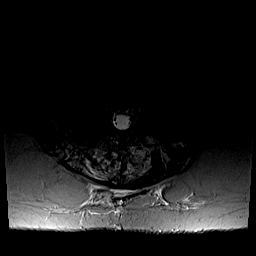
[im 11/36]
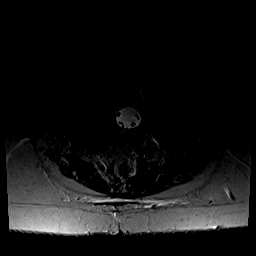
[im 17/36]
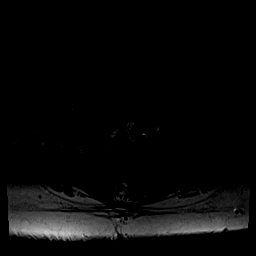
[im 19/36]
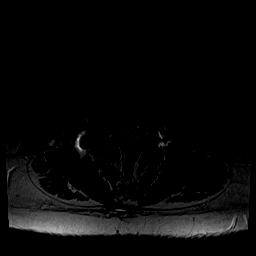
[im 25/36]
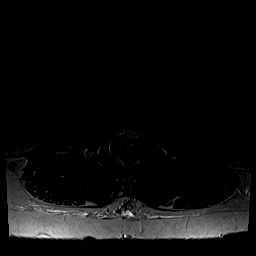
[im 30/36]
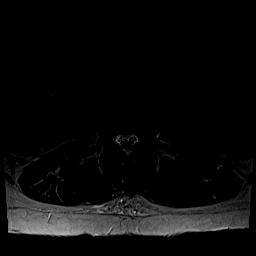
[im 36/36]
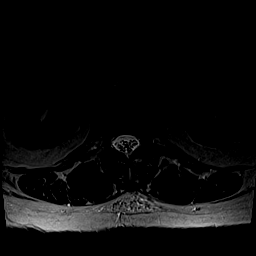

[Series 6: T1 · axial · 4.0mm · 0.31mm/px · z∈[-128,+20]mm · 3 of 36 slices shown (2 of 2)]
[im 6/36]
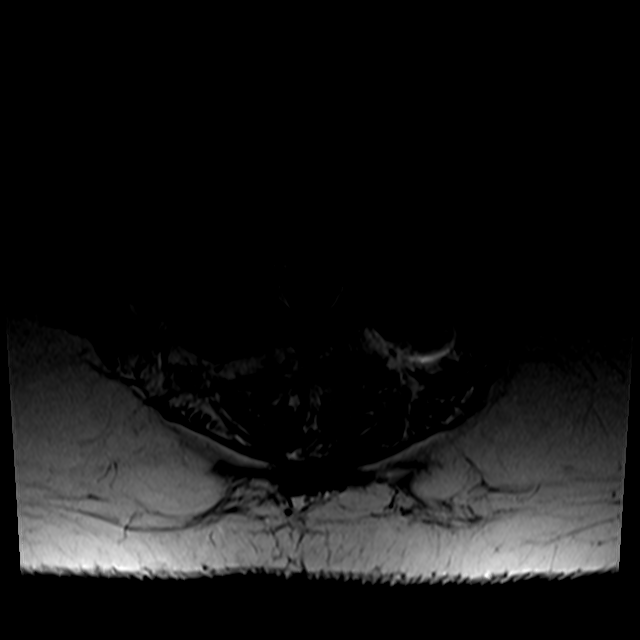
[im 19/36]
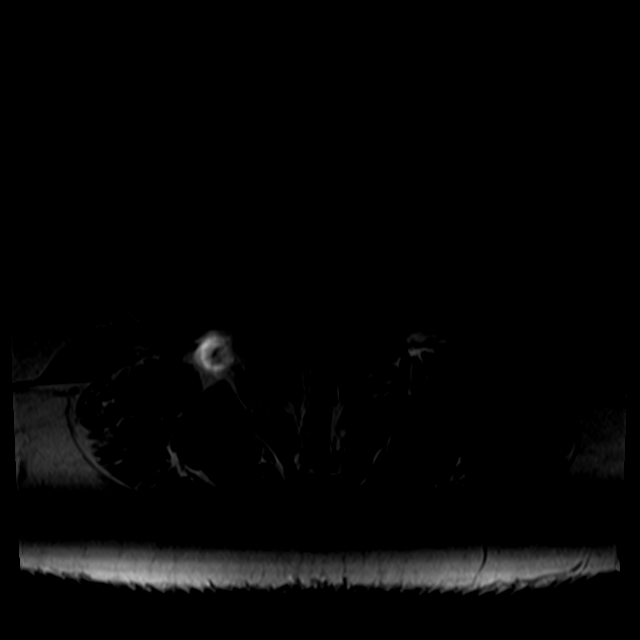
[im 30/36]
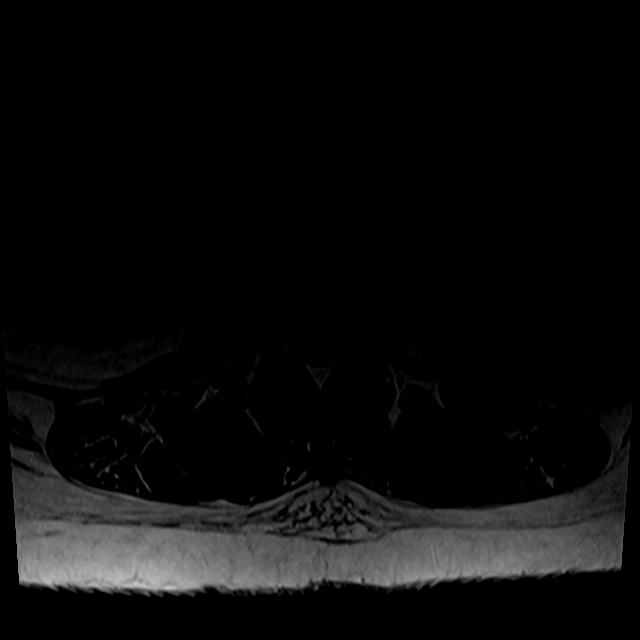

[23 of 48 positions shown; findings below may reference images not displayed]

FINDINGS: The patient is status post L4-5 fusion as seen on the prior
examination. Vertebral body height is maintained. 0.7 cm
retrolisthesis L3 on L4 is unchanged since the prior study.
Degenerative endplate signal change is seen at L3-4. There is no
worrisome marrow lesion. The conus medullaris is normal in signal
and position. Imaged intra-abdominal contents are unremarkable.

T11-12 and T12-L1 are imaged in the sagittal plane only. There is a
shallow disc bulge at T11-12 effacing the ventral thecal sac. T12-L1
is unremarkable. The appearance of these levels is unchanged.

L1-2: There is a shallow down turning central disc protrusion
without central or foraminal stenosis, unchanged.

L2-3: Facet degenerative disease and a shallow disc bulge does not
appear notably changed. The central canal and foramina appear open.

L3-4: There has been progression of disease at this level. The
patient has a disc bulge with superimposed foraminal protrusions
bilaterally, larger on the right. Facet arthropathy is noted. There
is moderate central canal stenosis. Severe bilateral foraminal
narrowing is worse on the right and increased since the prior study.

L4-5: Status post laminectomy and fusion. The central canal and
foramina are widely patent.

L5-S1: There is some facet degenerative disease. No disc bulge or
protrusion. The central canal and foramina are open. The appearance
is unchanged.
IMPRESSION: Progressive spondylosis at L3-4 where there is severe bilateral
foraminal narrowing due to a combination of disc protrusions within
the foramina and facet arthropathy. Foraminal narrowing appears
worse on the right. The central canal is mildly narrowed at this
level. The appearance of the lumbar spine is otherwise unchanged.

Status post L4-5 discectomy and fusion. The central canal and
foramina are widely patent.

## 2016-08-04 ENCOUNTER — Encounter: Admitting: Pain Medicine

## 2016-08-07 ENCOUNTER — Encounter: Admitting: Pain Medicine

## 2016-09-22 ENCOUNTER — Telehealth: Payer: Self-pay

## 2016-09-22 NOTE — Telephone Encounter (Signed)
Attempted to call patient and discuss her pain so that we could establish what procedure was needed.  Left a message for patient to return our call.

## 2016-09-22 NOTE — Telephone Encounter (Signed)
Pt says she is having left hip and left shoulder pain but the hip is worse than the shoulder pain

## 2016-09-25 ENCOUNTER — Ambulatory Visit: Payer: Medicare Other | Attending: Pain Medicine | Admitting: Pain Medicine

## 2016-09-25 ENCOUNTER — Encounter: Payer: Self-pay | Admitting: Pain Medicine

## 2016-09-25 VITALS — BP 132/82 | HR 71 | Temp 98.0°F | Resp 18 | Ht 65.0 in | Wt 219.0 lb

## 2016-09-25 DIAGNOSIS — M150 Primary generalized (osteo)arthritis: Secondary | ICD-10-CM

## 2016-09-25 DIAGNOSIS — G894 Chronic pain syndrome: Secondary | ICD-10-CM

## 2016-09-25 DIAGNOSIS — M62838 Other muscle spasm: Secondary | ICD-10-CM | POA: Diagnosis not present

## 2016-09-25 DIAGNOSIS — M25511 Pain in right shoulder: Secondary | ICD-10-CM

## 2016-09-25 DIAGNOSIS — M160 Bilateral primary osteoarthritis of hip: Secondary | ICD-10-CM | POA: Diagnosis not present

## 2016-09-25 DIAGNOSIS — Z79891 Long term (current) use of opiate analgesic: Secondary | ICD-10-CM | POA: Diagnosis not present

## 2016-09-25 DIAGNOSIS — J449 Chronic obstructive pulmonary disease, unspecified: Secondary | ICD-10-CM | POA: Insufficient documentation

## 2016-09-25 DIAGNOSIS — K5903 Drug induced constipation: Secondary | ICD-10-CM

## 2016-09-25 DIAGNOSIS — M5416 Radiculopathy, lumbar region: Secondary | ICD-10-CM

## 2016-09-25 DIAGNOSIS — M25512 Pain in left shoulder: Secondary | ICD-10-CM | POA: Diagnosis not present

## 2016-09-25 DIAGNOSIS — F329 Major depressive disorder, single episode, unspecified: Secondary | ICD-10-CM | POA: Diagnosis not present

## 2016-09-25 DIAGNOSIS — M9983 Other biomechanical lesions of lumbar region: Secondary | ICD-10-CM | POA: Diagnosis not present

## 2016-09-25 DIAGNOSIS — M19012 Primary osteoarthritis, left shoulder: Secondary | ICD-10-CM | POA: Diagnosis not present

## 2016-09-25 DIAGNOSIS — Z5181 Encounter for therapeutic drug level monitoring: Secondary | ICD-10-CM | POA: Diagnosis present

## 2016-09-25 DIAGNOSIS — M5442 Lumbago with sciatica, left side: Secondary | ICD-10-CM

## 2016-09-25 DIAGNOSIS — M48061 Spinal stenosis, lumbar region without neurogenic claudication: Secondary | ICD-10-CM

## 2016-09-25 DIAGNOSIS — M25552 Pain in left hip: Secondary | ICD-10-CM | POA: Diagnosis not present

## 2016-09-25 DIAGNOSIS — M25551 Pain in right hip: Secondary | ICD-10-CM | POA: Diagnosis not present

## 2016-09-25 DIAGNOSIS — Z79899 Other long term (current) drug therapy: Secondary | ICD-10-CM | POA: Insufficient documentation

## 2016-09-25 DIAGNOSIS — M159 Polyosteoarthritis, unspecified: Secondary | ICD-10-CM

## 2016-09-25 DIAGNOSIS — M791 Myalgia: Secondary | ICD-10-CM | POA: Diagnosis not present

## 2016-09-25 DIAGNOSIS — M7918 Myalgia, other site: Secondary | ICD-10-CM

## 2016-09-25 DIAGNOSIS — M542 Cervicalgia: Secondary | ICD-10-CM | POA: Diagnosis not present

## 2016-09-25 DIAGNOSIS — G8929 Other chronic pain: Secondary | ICD-10-CM

## 2016-09-25 DIAGNOSIS — Z7982 Long term (current) use of aspirin: Secondary | ICD-10-CM | POA: Diagnosis not present

## 2016-09-25 DIAGNOSIS — M5116 Intervertebral disc disorders with radiculopathy, lumbar region: Secondary | ICD-10-CM | POA: Diagnosis not present

## 2016-09-25 DIAGNOSIS — M5441 Lumbago with sciatica, right side: Secondary | ICD-10-CM

## 2016-09-25 DIAGNOSIS — F32A Depression, unspecified: Secondary | ICD-10-CM

## 2016-09-25 DIAGNOSIS — T402X5A Adverse effect of other opioids, initial encounter: Secondary | ICD-10-CM

## 2016-09-25 MED ORDER — BISACODYL 5 MG PO TBEC: 10.0000 mg | DELAYED_RELEASE_TABLET | Freq: Every evening | ORAL | 99 refills | Status: DC | PRN

## 2016-09-25 MED ORDER — TRAMADOL HCL 50 MG PO TABS: 100.0000 mg | ORAL_TABLET | Freq: Four times a day (QID) | ORAL | 1 refills | Status: DC | PRN

## 2016-09-25 MED ORDER — TIZANIDINE HCL 4 MG PO CAPS: 4.0000 mg | ORAL_CAPSULE | Freq: Three times a day (TID) | ORAL | 1 refills | Status: DC | PRN

## 2016-09-25 MED ORDER — DOCUSATE SODIUM 100 MG PO CAPS: 200.0000 mg | ORAL_CAPSULE | Freq: Every evening | ORAL | 0 refills | Status: DC | PRN

## 2016-09-25 MED ORDER — TRAMADOL HCL 50 MG PO TABS: 100.0000 mg | ORAL_TABLET | Freq: Four times a day (QID) | ORAL | 0 refills | Status: DC | PRN

## 2016-09-25 MED ORDER — BENEFIBER PO POWD: ORAL | 99 refills | Status: DC

## 2016-09-25 MED ORDER — MELOXICAM 15 MG PO TABS: 15.0000 mg | ORAL_TABLET | Freq: Every day | ORAL | 0 refills | Status: DC

## 2016-09-25 NOTE — Telephone Encounter (Signed)
Patient has appointment today. Will complete this note.

## 2016-09-25 NOTE — Progress Notes (Signed)
Patient's Name: Regina Campos  MRN: [id number]  Referring Provider: No ref. provider found  DOB: [birth year]  PCP: Lenard Simmer, MD  DOS: 09/25/2016  Note by: Kathlen Brunswick. Dossie Arbour, MD  Service setting: Ambulatory outpatient  Specialty: Interventional Pain Management  Location: [facility] (AMB) Pain Management Facility    Patient type: Established   Primary Reason(s) for Visit: Encounter for prescription drug management (Level of risk: moderate) CC: Hip Pain (left) and Neck Pain  HPI  Regina Campos is a 66 y.o. year old, female patient, who comes today for a medication management evaluation. She has Encounter for therapeutic drug level monitoring; Long term current use of opiate analgesic; Uncomplicated opioid dependence ([place]); Opiate use (40 MME/Day); Chronic neck pain (Location of Primary Source of Pain); Chronic low back pain (Location of Secondary source of pain); Lumbar radicular pain; Lumbar spondylosis with radicular symptoms; Chronic pain syndrome; Disorder of shoulder; Pulmonary emphysema ([place]); Atypical migraine; Apnea, sleep; Displacement of lumbar intervertebral disc; High cholesterol; Hypothyroidism; History of cervical spinal surgery; Foraminal stenosis of cervical region; Arthropathy of shoulder region (Location of Tertiary source of pain); Cervicogenic headache; Pain in joint involving right lower leg; Fibromyalgia; History of cardiac arrhythmia; Depression; Obesity; Failed back surgical syndrome; Spinal stenosis in cervical region; Long term prescription opiate use; Myofascial pain; Cervical paraspinal muscle spasm; Cervical spondylosis with radiculopathy (Right side); 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 Her primarily concern today is the Hip Pain (left) and Neck Pain  Pain Assessment: Self-Reported Pain Score: 5 /10 Clinically the patient looks like a 3/10 Reported level is inconsistent with clinical observations. Information on the proper use of the pain scale provided to the patient today Pain Type: Chronic pain Pain Location: Hip Pain Orientation: Left Pain Descriptors / Indicators: Aching, Constant, Dull Pain Frequency: Intermittent  Regina Campos was last seen on 07/22/2016 for medication management. During today's appointment we reviewed Regina Campos's chronic pain status, as well as her outpatient medication regimen.  The patient  reports that she does not use drugs. Her body mass index is 36.44 kg/m.  Further details on both, my assessment(s), as well as the proposed treatment plan, please see below.  Controlled Substance Pharmacotherapy Assessment REMS (Risk Evaluation and Mitigation Strategy)  Analgesic: Tramadol 50 mg 2 tablets every 6 hours (400 mg/day) MME/day: 40 mg/day  Zenovia Jarred, RN  09/25/2016  1:27 PM  Signed Nursing Pain Medication Assessment:  Safety precautions to be maintained throughout the outpatient stay will include: orient to surroundings, keep bed in low position, maintain call bell within reach at all times, provide assistance with transfer out of bed and ambulation.  Medication Inspection Compliance: Pill count conducted under aseptic conditions, in front of the patient. Neither the pills nor the bottle was removed from the patient's sight at any time. Once count was completed pills were immediately returned to the patient in their original bottle.  Medication: Tramadol (Ultram) Pill/Patch Count: 7 of 240 pills  remain Bottle Appearance: Old prescription bottle. Patient reminded that medications should always be kept in the newest prescription bottle. Filled Date: 50 / 08 / 2017 Last Medication  intake:  Today   Pharmacokinetics: Liberation and absorption (onset of action): WNL Distribution (time to peak effect): WNL Metabolism and excretion (duration of action): WNL         Pharmacodynamics: Desired effects: Analgesia: Regina Campos reports >50% benefit. Functional ability: Patient reports that medication allows her to accomplish basic ADLs Clinically meaningful improvement in function (CMIF): Sustained CMIF goals met Perceived effectiveness: Described as relatively effective, allowing for increase in activities of daily living (ADL) Undesirable effects: Side-effects or Adverse reactions: None reported Monitoring: [place] PMP: Online review of the past 47-monthperiod conducted. Compliant with practice rules and regulations List of all UDS test(s) done:  Lab Results  Component Value Date   TOXASSSELUR FINAL 09/18/2015   Last UDS on record: ToxAssure Select 13  Date Value Ref Range Status  09/18/2015 FINAL  Final    Comment:    ==================================================================== TOXASSURE SELECT 13 (MW) ==================================================================== Test                             Result       Flag       Units Drug Present and Declared for Prescription Verification   Tramadol                       PRESENT      EXPECTED   O-Desmethyltramadol            PRESENT      EXPECTED   N-Desmethyltramadol            PRESENT      EXPECTED    Source of tramadol is a prescription medication.    O-desmethyltramadol and N-desmethyltramadol are expected    metabolites of tramadol. ==================================================================== Test                      Result    Flag   Units      Ref Range   Creatinine              89               mg/dL       >=20 ==================================================================== Declared Medications:  The flagging and interpretation on this report are based on the  following declared medications.  Unexpected results may arise from  inaccuracies in the declared medications.  **Note: The testing scope of this panel includes these medications:  Tramadol ==================================================================== For clinical consultation, please call ([phone] ====================================================================    UDS interpretation: Compliant          Medication Assessment Form: Reviewed. Patient indicates being compliant with therapy Treatment compliance: Compliant Risk Assessment Profile: Aberrant behavior: See prior evaluations. None observed or detected today Comorbid factors increasing risk of overdose: See prior notes. No additional risks detected today Risk of substance use disorder (SUD): Low Opioid Risk Tool (ORT) Total Score: 1  Interpretation Table:  Score <3 = Low Risk for SUD  Score between 4-7 = Moderate Risk for SUD  Score >8 = High Risk for Opioid Abuse   Risk Mitigation Strategies:  Patient Counseling: Covered Patient-Prescriber Agreement (PPA): Present and active  Notification to other healthcare providers: Done  Pharmacologic Plan: No change in therapy, at this time  Laboratory Chemistry  Inflammation Markers Lab Results  Component Value Date   ESRSEDRATE 11 12/06/2012   Renal Function No results found for: BUN, CREATININE, GFRAA, GFRNONAA Hepatic Function No results found for:  AST, ALT, ALBUMIN Electrolytes No results found for: NA, K, CL, CALCIUM, MG Pain Modulating Vitamins No results found for: [place], [id number], [id number], [id number], 25OHVITD1, 25OHVITD2, 25OHVITD3, VITAMINB12 Coagulation Parameters No results found for: INR, LABPROT, APTT, PLT Cardiovascular No results found for: BNP, HGB, HCT Note: Lab results  reviewed.  Recent Diagnostic Imaging Review  Mr Lumbar Spine Wo Contrast Result Date: 10/04/2015 CLINICAL DATA:  Low back and bilateral upper leg pain for 2 years, worsened over the past year. History of prior lumbar surgery. EXAM: MRI LUMBAR SPINE WITHOUT CONTRAST TECHNIQUE: Multiplanar, multisequence MR imaging of the lumbar spine was performed. No intravenous contrast was administered. COMPARISON:  Plain films lumbar spine 11/04/2013 and MRI lumbar spine 10/15/2012. FINDINGS: The patient is status post L4-5 fusion as seen on the prior examination. Vertebral body height is maintained. 0.7 cm retrolisthesis L3 on L4 is unchanged since the prior study. Degenerative endplate signal change is seen at L3-4. There is no worrisome marrow lesion. The conus medullaris is normal in signal and position. Imaged intra-abdominal contents are unremarkable. T11-12 and T12-L1 are imaged in the sagittal plane only. There is a shallow disc bulge at T11-12 effacing the ventral thecal sac. T12-L1 is unremarkable. The appearance of these levels is unchanged. L1-2: There is a shallow down turning central disc protrusion without central or foraminal stenosis, unchanged. L2-3: Facet degenerative disease and a shallow disc bulge does not appear notably changed. The central canal and foramina appear open. L3-4: There has been progression of disease at this level. The patient has a disc bulge with superimposed foraminal protrusions bilaterally, larger on the right. Facet arthropathy is noted. There is moderate central canal stenosis. Severe bilateral foraminal narrowing is worse on the right and increased since the prior study. L4-5: Status post laminectomy and fusion. The central canal and foramina are widely patent. L5-S1: There is some facet degenerative disease. No disc bulge or protrusion. The central canal and foramina are open. The appearance is unchanged. IMPRESSION: Progressive spondylosis at L3-4 where there is severe bilateral  foraminal narrowing due to a combination of disc protrusions within the foramina and facet arthropathy. Foraminal narrowing appears worse on the right. The central canal is mildly narrowed at this level. The appearance of the lumbar spine is otherwise unchanged. Status post L4-5 discectomy and fusion. The central canal and foramina are widely patent. Electronically Signed   By: Inge Rise M.D.   On: 10/04/2015 10:15   Note: Imaging results reviewed.          Meds  The patient has a current medication list which includes the following prescription(s): albuterol, albuterol, aspirin, bisacodyl, budesonide-formoterol, calcium carbonate-vitamin d, docusate sodium, fenofibrate, fluticasone, levothyroxine, lubiprostone, meloxicam, montelukast, multi-vitamins, rosuvastatin, senna-docusate, tiotropium, tizanidine, tramadol, tramadol, and benefiber.  Current Outpatient Prescriptions on File Prior to Visit  Medication Sig  . albuterol (VOSPIRE ER) 4 MG 12 hr tablet 4 mg daily.  Marland Kitchen aspirin 81 MG tablet Take 81 mg by mouth daily.  . budesonide-formoterol (SYMBICORT) 160-4.5 MCG/ACT inhaler Inhale 2 puffs into the lungs 2 (two) times daily.  . Calcium Carbonate-Vitamin D (CALCIUM 500 + D PO) Take 1 tablet by mouth daily.  . fenofibrate (TRICOR) 145 MG tablet Take 145 mg by mouth daily.  Marland Kitchen levothyroxine (SYNTHROID, LEVOTHROID) 125 MCG tablet Take 125 mcg by mouth daily before breakfast.  . lubiprostone (AMITIZA) 24 MCG capsule Take 24 mcg by mouth 2 (two) times daily with a meal.  . rosuvastatin (CRESTOR) 10 MG tablet Take 10 mg by  mouth daily.   No current facility-administered medications on file prior to visit.    ROS  Constitutional: Denies any fever or chills Gastrointestinal: No reported hemesis, hematochezia, vomiting, or acute GI distress Musculoskeletal: Denies any acute onset joint swelling, redness, loss of ROM, or weakness Neurological: No reported episodes of acute onset apraxia, aphasia,  dysarthria, agnosia, amnesia, paralysis, loss of coordination, or loss of consciousness  Allergies  Regina Campos has No Known Allergies.  [place]  Drug: Regina Campos  reports that she does not use drugs. Alcohol:  reports that she does not drink alcohol. Tobacco:  reports that she has never smoked. She has never used smokeless tobacco. Medical:  has a past medical history of Arthritis; History of cardiac arrhythmia (06/04/2015); History of cervical spinal surgery (06/04/2015); Hyperlipidemia; Hypothyroidism (06/04/2015); and Thyroid disease. Family: family history includes Cancer in her mother; Heart disease in her father.  Past Surgical History:  Procedure Laterality Date  . ABDOMINAL HYSTERECTOMY    . APPENDECTOMY    . BACK SURGERY  07/25/2016  . CARPAL TUNNEL RELEASE    . LUMBAR FUSION    . REPLACEMENT TOTAL KNEE     right and left  . TOTAL SHOULDER REPLACEMENT     Constitutional Exam  General appearance: Well nourished, well developed, and well hydrated. In no apparent acute distress Vitals:   09/25/16 1314 09/25/16 1316  BP:  132/82  Pulse:  71  Resp:  18  Temp:  98 F (36.7 C)  SpO2:  97%  Weight: 219 lb (99.3 kg)   Height: 5' 5" (1.651 m)    BMI Assessment: Estimated body mass index is 36.44 kg/m as calculated from the following:   Height as of this encounter: 5' 5" (1.651 m).   Weight as of this encounter: 219 lb (99.3 kg).  BMI interpretation table: BMI level Category Range association with higher incidence of chronic pain  <18 kg/m2 Underweight   18.5-24.9 kg/m2 Ideal body weight   25-29.9 kg/m2 Overweight Increased incidence by 20%  30-34.9 kg/m2 Obese (Class I) Increased incidence by 68%  35-39.9 kg/m2 Severe obesity (Class II) Increased incidence by 136%  >40 kg/m2 Extreme obesity (Class III) Increased incidence by 254%   BMI Readings from Last 4 Encounters:  09/25/16 36.44 kg/m  02/11/16 36.61 kg/m  10/09/15 38.27 kg/m  09/20/15 39.82 kg/m   Wt  Readings from Last 4 Encounters:  09/25/16 219 lb (99.3 kg)  02/11/16 220 lb (99.8 kg)  10/09/15 230 lb (104.3 kg)  09/20/15 232 lb (105.2 kg)  Psych/Mental status: Alert, oriented x 3 (person, place, & time)       Eyes: PERLA Respiratory: No evidence of acute respiratory distress  Cervical Spine Exam  Inspection: No masses, redness, or swelling Alignment: Symmetrical Functional ROM: Unrestricted ROM Stability: No instability detected Muscle strength & Tone: Functionally intact Sensory: Unimpaired Palpation: Non-contributory  Upper Extremity (UE) Exam    Side: Right upper extremity  Side: Left upper extremity  Inspection: No masses, redness, swelling, or asymmetry. No contractures  Inspection: No masses, redness, swelling, or asymmetry. No contractures  Functional ROM: Decreased ROM for shoulder  Functional ROM: Decreased ROM for shoulder  Muscle strength & Tone: Functionally intact  Muscle strength & Tone: Functionally intact  Sensory: Movement-associated discomfort  Sensory: Movement-associated discomfort  Palpation: Complains of area being tender to palpation  Palpation: Complains of area being tender to palpation  Specialized Test(s): Deferred         Specialized Test(s): Deferred  Thoracic Spine Exam  Inspection: No masses, redness, or swelling Alignment: Symmetrical Functional ROM: Unrestricted ROM Stability: No instability detected Sensory: Unimpaired Muscle strength & Tone: Functionally intact Palpation: Non-contributory  Lumbar Spine Exam  Inspection: No masses, redness, or swelling Alignment: Symmetrical Functional ROM: Unrestricted ROM Stability: No instability detected Muscle strength & Tone: Functionally intact Sensory: Unimpaired Palpation: Non-contributory Provocative Tests: Lumbar Hyperextension and rotation test: evaluation deferred today       Patrick's Maneuver: evaluation deferred today              Gait & Posture Assessment  Ambulation:  Unassisted Gait: Relatively normal for age and body habitus Posture: WNL   Lower Extremity Exam    Side: Right lower extremity  Side: Left lower extremity  Inspection: No masses, redness, swelling, or asymmetry. No contractures  Inspection: No masses, redness, swelling, or asymmetry. No contractures  Functional ROM: Decreased ROM for hip joint  Functional ROM: Decreased ROM for hip joint  Muscle strength & Tone: Functionally intact  Muscle strength & Tone: Functionally intact  Sensory: Movement-associated pain  Sensory: Movement-associated pain  Palpation: No palpable anomalies  Palpation: No palpable anomalies   Assessment  Primary Diagnosis & Pertinent Problem List: The primary encounter diagnosis was Chronic pain syndrome. 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  Status Diagnosis  Controlled Controlled Controlled 1. Chronic pain syndrome   2. Chronic neck pain (Location of Primary Source of Pain)   3. Chronic low back pain (Location of Secondary source of pain)   4. 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 Lumbar radicular pain   6. Myofascial pain   7. Cervical paraspinal muscle spasm   8. Osteoarthritis   9. Therapeutic opioid-induced constipation (OIC)   10. Chronic hip pain, left   11. Chronic hip pain, right   12. Primary osteoarthritis of both hips   13. Depression, unspecified depression type   14. Chronic pain of both shoulders      Plan of Care  Pharmacotherapy (Medications  Ordered): Meds ordered this encounter  Medications  . traMADol (ULTRAM) 50 MG tablet    Sig: Take 2 tablets (100 mg total) by mouth every 6 (six) hours as needed for moderate pain or severe pain.    Dispense:  720 tablet    Refill:  1    Fill one day early if pharmacy is closed on scheduled refill date. Do not fill until: 09/25/16 To last until: 03/24/17  . tiZANidine (ZANAFLEX) 4 MG capsule    Sig: Take 1 capsule (4 mg total) by mouth 3 (three) times daily as needed for muscle spasms.    Dispense:  270 capsule    Refill:  1    Do not place this medication, or any other prescription from our practice, on "Automatic Refill". Patient may have prescription filled one day early if pharmacy is closed on scheduled refill date.  . meloxicam (MOBIC) 15 MG tablet    Sig: Take 1 tablet (15 mg total) by mouth daily.    Dispense:  180 tablet    Refill:  0    Do not place this medication,  or any other prescription from our practice, on "Automatic Refill". Patient may have prescription filled one day early if pharmacy is closed on scheduled refill date.  . Wheat Dextrin (BENEFIBER) POWD    Sig: Stir 2 tsp. TID into 4-8 oz of any non-carbonated beverage or soft food (hot or cold)    Dispense:  500 g    Refill:  PRN    Do not place this medication, or any other prescription from our practice, on "Automatic Refill". Patient may have prescription filled one day early if pharmacy is closed on scheduled refill date.  . docusate sodium (COLACE) 100 MG capsule    Sig: Take 2 capsules (200 mg total) by mouth at bedtime as needed for moderate constipation.    Dispense:  180 capsule    Refill:  0    Do not place this medication, or any other prescription from our practice, on "Automatic Refill". Patient may have prescription filled one day early if pharmacy is closed on scheduled refill date.  . bisacodyl (DULCOLAX) 5 MG EC tablet    Sig: Take 2 tablets (10 mg total) by mouth at bedtime as needed for moderate  constipation ((Hold for loose stool)).    Dispense:  100 tablet    Refill:  PRN    Do not place this medication, or any other prescription from our practice, on "Automatic Refill". Patient may have prescription filled one day early if pharmacy is closed on scheduled refill date.  . traMADol (ULTRAM) 50 MG tablet    Sig: Take 2 tablets (100 mg total) by mouth every 6 (six) hours as needed for moderate pain or severe pain.    Dispense:  56 tablet    Refill:  0   New Prescriptions   No medications on file   Medications administered today: Regina Campos had no medications administered during this visit. Lab-work, procedure(s), and/or referral(s): Orders Placed This Encounter  Procedures  . HIP INJECTION  . Ambulatory referral to Psychology   Imaging and/or referral(s): AMB REFERRAL TO PSYCHOLOGY  Interventional therapies: Planned, scheduled, and/or pending:   Bilateral intra-articular Hip injection   Considering:  Diagnostic Right Suprascapular Nerve Block Possible Right Suprascapular Nerve RFA Diagnostic Left intra-articular shoulder joint injection Bilateral intra-articular Hip injection Palliative right-sided L3-4 lumbar epidural steroid injection Palliative right-sided L4-5 transforaminal epidural steroid injection Palliative right-sided L2-3 lumbar epidural steroid injection Palliative right-sided C7-T1 cervical epidural steroid injection Diagnostic bilateral cervical facet block Possible bilateral cervical facet radiofrequency ablation Diagnostic bilateral lumbar facet block Possible bilateral lumbar facet radiofrequency ablation Diagnostic bilateral shoulder joint injection Diagnostic bilateral suprascapular nerve block Possible bilateral suprascapular nerve radiofrequency ablation   Palliative PRN treatment(s):  Palliative right-sided L3-4 lumbar epidural steroid injection Palliative right-sided L4-5 transforaminal epidural steroid injection Palliative right-sided  L2-3 lumbar epidural steroid injection Palliative right-sided C7-T1 cervical epidural steroid injection Diagnostic bilateral cervical facet block Diagnostic bilateral lumbar facet block Diagnostic bilateral shoulder joint injection Diagnostic bilateral suprascapular nerve block   Provider-requested follow-up: Return for (NP) Med-Mgmt, in addition, procedure (ASAP).  Future Appointments Date Time Provider [place]  10/01/2016 9:00 AM Milinda Pointer, MD [facility] None   Primary Care Physician: Lenard Simmer, MD Location: [facility] Outpatient Pain Management Facility Note by: Kathlen Brunswick. Dossie Arbour, M.D, DABA, DABAPM, DABPM, DABIPP, FIPP Date: 09/25/2016; Time: 3:02 PM  Pain Score Disclaimer: We use the NRS-11 scale. This is a self-reported, subjective measurement of pain severity with only modest accuracy. It is used primarily to identify changes within a particular  patient. It must be understood that outpatient pain scales are significantly less accurate that those used for research, where they can be applied under ideal controlled circumstances with minimal exposure to variables. In reality, the score is likely to be a combination of pain intensity and pain affect, where pain affect describes the degree of emotional arousal or changes in action readiness caused by the sensory experience of pain. Factors such as social and work situation, setting, emotional state, anxiety levels, expectation, and prior pain experience may influence pain perception and show large inter-individual differences that may also be affected by time variables.  Patient instructions provided during this appointment: Patient Instructions   You were given 2 prescriptions for Tramadol today. Prescriptions for Dulcolax, Colace, Meloxicam, Tizanidine, Tramadol, and Benefiber were went to your pharmacy.  GENERAL RISKS AND COMPLICATIONS  What are the risk, side effects and possible complications? Generally  speaking, most procedures are safe.  However, with any procedure there are risks, side effects, and the possibility of complications.  The risks and complications are dependent upon the sites that are lesioned, or the type of nerve block to be performed.  The closer the procedure is to the spine, the more serious the risks are.  Great care is taken when placing the radio frequency needles, block needles or lesioning probes, but sometimes complications can occur. 1. Infection: Any time there is an injection through the skin, there is a risk of infection.  This is why sterile conditions are used for these blocks.  There are four possible types of infection. 1. Localized skin infection. 2. Central Nervous System Infection-This can be in the form of Meningitis, which can be deadly. 3. Epidural Infections-This can be in the form of an epidural abscess, which can cause pressure inside of the spine, causing compression of the spinal cord with subsequent paralysis. This would require an emergency surgery to decompress, and there are no guarantees that the patient would recover from the paralysis. 4. Discitis-This is an infection of the intervertebral discs.  It occurs in about 1% of discography procedures.  It is difficult to treat and it may lead to surgery.        2. Pain: the needles have to go through skin and soft tissues, will cause soreness.       3. Damage to internal structures:  The nerves to be lesioned may be near blood vessels or    other nerves which can be potentially damaged.       4. Bleeding: Bleeding is more common if the patient is taking blood thinners such as  aspirin, Coumadin, Ticiid, Plavix, etc., or if he/she have some genetic predisposition  such as hemophilia. Bleeding into the spinal canal can cause compression of the spinal  cord with subsequent paralysis.  This would require an emergency surgery to  decompress and there are no guarantees that the patient would recover from the   paralysis.       5. Pneumothorax:  Puncturing of a lung is a possibility, every time a needle is introduced in  the area of the chest or upper back.  Pneumothorax refers to free air around the  collapsed lung(s), inside of the thoracic cavity (chest cavity).  Another two possible  complications related to a similar event would include: Hemothorax and Chylothorax.   These are variations of the Pneumothorax, where instead of air around the collapsed  lung(s), you may have blood or chyle, respectively.       6. Spinal headaches:  They may occur with any procedures in the area of the spine.       7. Persistent CSF (Cerebro-Spinal Fluid) leakage: This is a rare problem, but may occur  with prolonged intrathecal or epidural catheters either due to the formation of a fistulous  track or a dural tear.       8. Nerve damage: By working so close to the spinal cord, there is always a possibility of  nerve damage, which could be as serious as a permanent spinal cord injury with  paralysis.       9. Death:  Although rare, severe deadly allergic reactions known as "Anaphylactic  reaction" can occur to any of the medications used.      10. Worsening of the symptoms:  We can always make thing worse.  What are the chances of something like this happening? Chances of any of this occuring are extremely low.  By statistics, you have more of a chance of getting killed in a motor vehicle accident: while driving to the hospital than any of the above occurring .  Nevertheless, you should be aware that they are possibilities.  In general, it is similar to taking a shower.  Everybody knows that you can slip, hit your head and get killed.  Does that mean that you should not shower again?  Nevertheless always keep in mind that statistics do not mean anything if you happen to be on the wrong side of them.  Even if a procedure has a 1 (one) in a 1,000,000 (million) chance of going wrong, it you happen to be that one..Also, keep in mind  that by statistics, you have more of a chance of having something go wrong when taking medications.  Who should not have this procedure? If you are on a blood thinning medication (e.g. Coumadin, Plavix, see list of "Blood Thinners"), or if you have an active infection going on, you should not have the procedure.  If you are taking any blood thinners, please inform your physician.  How should I prepare for this procedure?  Do not eat or drink anything at least six hours prior to the procedure.  Bring a driver with you .  It cannot be a taxi.  Come accompanied by an adult that can drive you back, and that is strong enough to help you if your legs get weak or numb from the local anesthetic.  Take all of your medicines the morning of the procedure with just enough water to swallow them.  If you have diabetes, make sure that you are scheduled to have your procedure done first thing in the morning, whenever possible.  If you have diabetes, take only half of your insulin dose and notify our nurse that you have done so as soon as you arrive at the clinic.  If you are diabetic, but only take blood sugar pills (oral hypoglycemic), then do not take them on the morning of your procedure.  You may take them after you have had the procedure.  Do not take aspirin or any aspirin-containing medications, at least eleven (11) days prior to the procedure.  They may prolong bleeding.  Wear loose fitting clothing that may be easy to take off and that you would not mind if it got stained with Betadine or blood.  Do not wear any jewelry or perfume  Remove any nail coloring.  It will interfere with some of our monitoring equipment.  NOTE: Remember that this is not meant to be  interpreted as a complete list of all possible complications.  Unforeseen problems may occur.  BLOOD THINNERS The following drugs contain aspirin or other products, which can cause increased bleeding during surgery and should not be  taken for 2 weeks prior to and 1 week after surgery.  If you should need take something for relief of minor pain, you may take acetaminophen which is found in Tylenol,m Datril, Anacin-3 and Panadol. It is not blood thinner. The products listed below are.  Do not take any of the products listed below in addition to any listed on your instruction sheet.  A.P.C or A.P.C with Codeine Codeine Phosphate Capsules #3 Ibuprofen Ridaura  ABC compound Congesprin Imuran rimadil  Advil Cope Indocin Robaxisal  Alka-Seltzer Effervescent Pain Reliever and Antacid Coricidin or Coricidin-D  Indomethacin Rufen  Alka-Seltzer plus Cold Medicine Cosprin Ketoprofen S-A-C Tablets  Anacin Analgesic Tablets or Capsules Coumadin Korlgesic Salflex  Anacin Extra Strength Analgesic tablets or capsules CP-2 Tablets Lanoril Salicylate  Anaprox Cuprimine Capsules Levenox Salocol  Anexsia-D Dalteparin Magan Salsalate  Anodynos Darvon compound Magnesium Salicylate Sine-off  Ansaid Dasin Capsules Magsal Sodium Salicylate  Anturane Depen Capsules Marnal Soma  APF Arthritis pain formula Dewitt's Pills Measurin Stanback  Argesic Dia-Gesic Meclofenamic Sulfinpyrazone  Arthritis Bayer Timed Release Aspirin Diclofenac Meclomen Sulindac  Arthritis pain formula Anacin Dicumarol Medipren Supac  Analgesic (Safety coated) Arthralgen Diffunasal Mefanamic Suprofen  Arthritis Strength Bufferin Dihydrocodeine Mepro Compound Suprol  Arthropan liquid Dopirydamole Methcarbomol with Aspirin Synalgos  ASA tablets/Enseals Disalcid Micrainin Tagament  Ascriptin Doan's Midol Talwin  Ascriptin A/D Dolene Mobidin Tanderil  Ascriptin Extra Strength Dolobid Moblgesic Ticlid  Ascriptin with Codeine Doloprin or Doloprin with Codeine Momentum Tolectin  Asperbuf Duoprin Mono-gesic Trendar  Aspergum Duradyne Motrin or Motrin IB Triminicin  Aspirin plain, buffered or enteric coated Durasal Myochrisine Trigesic  Aspirin Suppositories Easprin Nalfon  Trillsate  Aspirin with Codeine Ecotrin Regular or Extra Strength Naprosyn Uracel  Atromid-S Efficin Naproxen Ursinus  Auranofin Capsules Elmiron Neocylate Vanquish  Axotal Emagrin Norgesic Verin  Azathioprine Empirin or Empirin with Codeine Normiflo Vitamin E  Azolid Emprazil Nuprin Voltaren  Bayer Aspirin plain, buffered or children's or timed BC Tablets or powders Encaprin Orgaran Warfarin Sodium  Buff-a-Comp Enoxaparin Orudis Zorpin  Buff-a-Comp with Codeine Equegesic Os-Cal-Gesic   Buffaprin Excedrin plain, buffered or Extra Strength Oxalid   Bufferin Arthritis Strength Feldene Oxphenbutazone   Bufferin plain or Extra Strength Feldene Capsules Oxycodone with Aspirin   Bufferin with Codeine Fenoprofen Fenoprofen Pabalate or Pabalate-SF   Buffets II Flogesic Panagesic   Buffinol plain or Extra Strength Florinal or Florinal with Codeine Panwarfarin   Buf-Tabs Flurbiprofen Penicillamine   Butalbital Compound Four-way cold tablets Penicillin   Butazolidin Fragmin Pepto-Bismol   Carbenicillin Geminisyn Percodan   Carna Arthritis Reliever Geopen Persantine   Carprofen Gold's salt Persistin   Chloramphenicol Goody's Phenylbutazone   Chloromycetin Haltrain Piroxlcam   Clmetidine heparin Plaquenil   Cllnoril Hyco-pap Ponstel   Clofibrate Hydroxy chloroquine Propoxyphen         Before stopping any of these medications, be sure to consult the physician who ordered them.  Some, such as Coumadin (Warfarin) are ordered to prevent or treat serious conditions such as "deep thrombosis", "pumonary embolisms", and other heart problems.  The amount of time that you may need off of the medication may also vary with the medication and the reason for which you were taking it.  If you are taking any of these medications, please make sure you notify your pain  physician before you undergo any procedures.

## 2016-09-25 NOTE — Progress Notes (Signed)
Nursing Pain Medication Assessment:  Safety precautions to be maintained throughout the outpatient stay will include: orient to surroundings, keep bed in low position, maintain call bell within reach at all times, provide assistance with transfer out of bed and ambulation.  Medication Inspection Compliance: Pill count conducted under aseptic conditions, in front of the patient. Neither the pills nor the bottle was removed from the patient's sight at any time. Once count was completed pills were immediately returned to the patient in their original bottle.  Medication: Tramadol (Ultram) Pill/Patch Count: 7 of 240 pills remain Bottle Appearance: Old prescription bottle. Patient reminded that medications should always be kept in the newest prescription bottle. Filled Date: 25 / 08 / 2017 Last Medication intake:  Today

## 2016-09-25 NOTE — Patient Instructions (Signed)
You were given 2 prescriptions for Tramadol today. Prescriptions for Dulcolax, Colace, Meloxicam, Tizanidine, Tramadol, and Benefiber were went to your pharmacy.  GENERAL RISKS AND COMPLICATIONS  What are the risk, side effects and possible complications? Generally speaking, most procedures are safe.  However, with any procedure there are risks, side effects, and the possibility of complications.  The risks and complications are dependent upon the sites that are lesioned, or the type of nerve block to be performed.  The closer the procedure is to the spine, the more serious the risks are.  Great care is taken when placing the radio frequency needles, block needles or lesioning probes, but sometimes complications can occur. 1. Infection: Any time there is an injection through the skin, there is a risk of infection.  This is why sterile conditions are used for these blocks.  There are four possible types of infection. 1. Localized skin infection. 2. Central Nervous System Infection-This can be in the form of Meningitis, which can be deadly. 3. Epidural Infections-This can be in the form of an epidural abscess, which can cause pressure inside of the spine, causing compression of the spinal cord with subsequent paralysis. This would require an emergency surgery to decompress, and there are no guarantees that the patient would recover from the paralysis. 4. Discitis-This is an infection of the intervertebral discs.  It occurs in about 1% of discography procedures.  It is difficult to treat and it may lead to surgery.        2. Pain: the needles have to go through skin and soft tissues, will cause soreness.       3. Damage to internal structures:  The nerves to be lesioned may be near blood vessels or    other nerves which can be potentially damaged.       4. Bleeding: Bleeding is more common if the patient is taking blood thinners such as  aspirin, Coumadin, Ticiid, Plavix, etc., or if he/she have some  genetic predisposition  such as hemophilia. Bleeding into the spinal canal can cause compression of the spinal  cord with subsequent paralysis.  This would require an emergency surgery to  decompress and there are no guarantees that the patient would recover from the  paralysis.       5. Pneumothorax:  Puncturing of a lung is a possibility, every time a needle is introduced in  the area of the chest or upper back.  Pneumothorax refers to free air around the  collapsed lung(s), inside of the thoracic cavity (chest cavity).  Another two possible  complications related to a similar event would include: Hemothorax and Chylothorax.   These are variations of the Pneumothorax, where instead of air around the collapsed  lung(s), you may have blood or chyle, respectively.       6. Spinal headaches: They may occur with any procedures in the area of the spine.       7. Persistent CSF (Cerebro-Spinal Fluid) leakage: This is a rare problem, but may occur  with prolonged intrathecal or epidural catheters either due to the formation of a fistulous  track or a dural tear.       8. Nerve damage: By working so close to the spinal cord, there is always a possibility of  nerve damage, which could be as serious as a permanent spinal cord injury with  paralysis.       9. Death:  Although rare, severe deadly allergic reactions known as "Anaphylactic  reaction" can occur  to any of the medications used.      10. Worsening of the symptoms:  We can always make thing worse.  What are the chances of something like this happening? Chances of any of this occuring are extremely low.  By statistics, you have more of a chance of getting killed in a motor vehicle accident: while driving to the hospital than any of the above occurring .  Nevertheless, you should be aware that they are possibilities.  In general, it is similar to taking a shower.  Everybody knows that you can slip, hit your head and get killed.  Does that mean that you should  not shower again?  Nevertheless always keep in mind that statistics do not mean anything if you happen to be on the wrong side of them.  Even if a procedure has a 1 (one) in a 1,000,000 (million) chance of going wrong, it you happen to be that one..Also, keep in mind that by statistics, you have more of a chance of having something go wrong when taking medications.  Who should not have this procedure? If you are on a blood thinning medication (e.g. Coumadin, Plavix, see list of "Blood Thinners"), or if you have an active infection going on, you should not have the procedure.  If you are taking any blood thinners, please inform your physician.  How should I prepare for this procedure?  Do not eat or drink anything at least six hours prior to the procedure.  Bring a driver with you .  It cannot be a taxi.  Come accompanied by an adult that can drive you back, and that is strong enough to help you if your legs get weak or numb from the local anesthetic.  Take all of your medicines the morning of the procedure with just enough water to swallow them.  If you have diabetes, make sure that you are scheduled to have your procedure done first thing in the morning, whenever possible.  If you have diabetes, take only half of your insulin dose and notify our nurse that you have done so as soon as you arrive at the clinic.  If you are diabetic, but only take blood sugar pills (oral hypoglycemic), then do not take them on the morning of your procedure.  You may take them after you have had the procedure.  Do not take aspirin or any aspirin-containing medications, at least eleven (11) days prior to the procedure.  They may prolong bleeding.  Wear loose fitting clothing that may be easy to take off and that you would not mind if it got stained with Betadine or blood.  Do not wear any jewelry or perfume  Remove any nail coloring.  It will interfere with some of our monitoring equipment.  NOTE: Remember  that this is not meant to be interpreted as a complete list of all possible complications.  Unforeseen problems may occur.  BLOOD THINNERS The following drugs contain aspirin or other products, which can cause increased bleeding during surgery and should not be taken for 2 weeks prior to and 1 week after surgery.  If you should need take something for relief of minor pain, you may take acetaminophen which is found in Tylenol,m Datril, Anacin-3 and Panadol. It is not blood thinner. The products listed below are.  Do not take any of the products listed below in addition to any listed on your instruction sheet.  A.P.C or A.P.C with Codeine Codeine Phosphate Capsules #3 Ibuprofen Ridaura  ABC compound Congesprin Imuran rimadil  Advil Cope Indocin Robaxisal  Alka-Seltzer Effervescent Pain Reliever and Antacid Coricidin or Coricidin-D  Indomethacin Rufen  Alka-Seltzer plus Cold Medicine Cosprin Ketoprofen S-A-C Tablets  Anacin Analgesic Tablets or Capsules Coumadin Korlgesic Salflex  Anacin Extra Strength Analgesic tablets or capsules CP-2 Tablets Lanoril Salicylate  Anaprox Cuprimine Capsules Levenox Salocol  Anexsia-D Dalteparin Magan Salsalate  Anodynos Darvon compound Magnesium Salicylate Sine-off  Ansaid Dasin Capsules Magsal Sodium Salicylate  Anturane Depen Capsules Marnal Soma  APF Arthritis pain formula Dewitt's Pills Measurin Stanback  Argesic Dia-Gesic Meclofenamic Sulfinpyrazone  Arthritis Bayer Timed Release Aspirin Diclofenac Meclomen Sulindac  Arthritis pain formula Anacin Dicumarol Medipren Supac  Analgesic (Safety coated) Arthralgen Diffunasal Mefanamic Suprofen  Arthritis Strength Bufferin Dihydrocodeine Mepro Compound Suprol  Arthropan liquid Dopirydamole Methcarbomol with Aspirin Synalgos  ASA tablets/Enseals Disalcid Micrainin Tagament  Ascriptin Doan's Midol Talwin  Ascriptin A/D Dolene Mobidin Tanderil  Ascriptin Extra Strength Dolobid Moblgesic Ticlid  Ascriptin with  Codeine Doloprin or Doloprin with Codeine Momentum Tolectin  Asperbuf Duoprin Mono-gesic Trendar  Aspergum Duradyne Motrin or Motrin IB Triminicin  Aspirin plain, buffered or enteric coated Durasal Myochrisine Trigesic  Aspirin Suppositories Easprin Nalfon Trillsate  Aspirin with Codeine Ecotrin Regular or Extra Strength Naprosyn Uracel  Atromid-S Efficin Naproxen Ursinus  Auranofin Capsules Elmiron Neocylate Vanquish  Axotal Emagrin Norgesic Verin  Azathioprine Empirin or Empirin with Codeine Normiflo Vitamin E  Azolid Emprazil Nuprin Voltaren  Bayer Aspirin plain, buffered or children's or timed BC Tablets or powders Encaprin Orgaran Warfarin Sodium  Buff-a-Comp Enoxaparin Orudis Zorpin  Buff-a-Comp with Codeine Equegesic Os-Cal-Gesic   Buffaprin Excedrin plain, buffered or Extra Strength Oxalid   Bufferin Arthritis Strength Feldene Oxphenbutazone   Bufferin plain or Extra Strength Feldene Capsules Oxycodone with Aspirin   Bufferin with Codeine Fenoprofen Fenoprofen Pabalate or Pabalate-SF   Buffets II Flogesic Panagesic   Buffinol plain or Extra Strength Florinal or Florinal with Codeine Panwarfarin   Buf-Tabs Flurbiprofen Penicillamine   Butalbital Compound Four-way cold tablets Penicillin   Butazolidin Fragmin Pepto-Bismol   Carbenicillin Geminisyn Percodan   Carna Arthritis Reliever Geopen Persantine   Carprofen Gold's salt Persistin   Chloramphenicol Goody's Phenylbutazone   Chloromycetin Haltrain Piroxlcam   Clmetidine heparin Plaquenil   Cllnoril Hyco-pap Ponstel   Clofibrate Hydroxy chloroquine Propoxyphen         Before stopping any of these medications, be sure to consult the physician who ordered them.  Some, such as Coumadin (Warfarin) are ordered to prevent or treat serious conditions such as "deep thrombosis", "pumonary embolisms", and other heart problems.  The amount of time that you may need off of the medication may also vary with the medication and the reason for  which you were taking it.  If you are taking any of these medications, please make sure you notify your pain physician before you undergo any procedures.

## 2016-10-01 ENCOUNTER — Encounter: Payer: Self-pay | Admitting: Pain Medicine

## 2016-10-01 ENCOUNTER — Ambulatory Visit
Admission: RE | Admit: 2016-10-01 | Discharge: 2016-10-01 | Disposition: A | Discharge status: Home / Self Care | Payer: Medicare Other | Source: Ambulatory Visit | Attending: Pain Medicine | Admitting: Pain Medicine

## 2016-10-01 ENCOUNTER — Ambulatory Visit: Admitting: Pain Medicine

## 2016-10-01 ENCOUNTER — Ambulatory Visit (HOSPITAL_BASED_OUTPATIENT_CLINIC_OR_DEPARTMENT_OTHER): Payer: Medicare Other | Admitting: Pain Medicine

## 2016-10-01 VITALS — BP 129/80 | HR 66 | Temp 98.0°F | Resp 17 | Ht 65.0 in | Wt 219.0 lb

## 2016-10-01 DIAGNOSIS — G8929 Other chronic pain: Secondary | ICD-10-CM | POA: Diagnosis not present

## 2016-10-01 DIAGNOSIS — M25552 Pain in left hip: Secondary | ICD-10-CM

## 2016-10-01 DIAGNOSIS — M25551 Pain in right hip: Secondary | ICD-10-CM

## 2016-10-01 DIAGNOSIS — M160 Bilateral primary osteoarthritis of hip: Secondary | ICD-10-CM

## 2016-10-01 MED ORDER — ROPIVACAINE HCL 2 MG/ML IJ SOLN: 4.0000 mL | Freq: Once | INTRAMUSCULAR | Status: DC

## 2016-10-01 MED ORDER — METHYLPREDNISOLONE ACETATE 80 MG/ML IJ SUSP: 80.0000 mg | Freq: Once | INTRAMUSCULAR | Status: DC

## 2016-10-01 MED ORDER — LIDOCAINE HCL (PF) 1 % IJ SOLN: 10.0000 mL | Freq: Once | INTRAMUSCULAR | Status: DC

## 2016-10-01 MED ORDER — ROPIVACAINE HCL 2 MG/ML IJ SOLN
INTRAMUSCULAR | Status: AC
  Administered 2016-10-01: 10:00:00
  Filled 2016-10-01: qty 20

## 2016-10-01 MED ORDER — METHYLPREDNISOLONE ACETATE 80 MG/ML IJ SUSP
INTRAMUSCULAR | Status: AC
  Administered 2016-10-01: 10:00:00
  Filled 2016-10-01: qty 2

## 2016-10-01 MED ORDER — LIDOCAINE HCL (PF) 1 % IJ SOLN
INTRAMUSCULAR | Status: AC
  Administered 2016-10-01: 10:00:00
  Filled 2016-10-01: qty 5

## 2016-10-01 MED ORDER — METHYLPREDNISOLONE ACETATE 80 MG/ML IJ SUSP
INTRAMUSCULAR | Status: AC
  Administered 2016-10-01: 10:00:00
  Filled 2016-10-01: qty 1

## 2016-10-01 MED ORDER — ROPIVACAINE HCL 2 MG/ML IJ SOLN
INTRAMUSCULAR | Status: AC
  Administered 2016-10-01: 10:00:00
  Filled 2016-10-01: qty 40

## 2016-10-01 NOTE — Progress Notes (Signed)
Patient's Name: Regina Campos  MRN: [id number]  Referring Provider: Milinda Pointer, MD  DOB: March 20, 1951  PCP: Lenard Simmer, MD  DOS: 10/01/2016  Note by: Kathlen Brunswick. Dossie Arbour, MD  Service setting: Ambulatory outpatient  Location: [facility] (AMB) Pain Management Facility  Visit type: Procedure  Specialty: Interventional Pain Management  Patient type: Established   Primary Reason for Visit: Interventional Pain Management Treatment. CC: Hip Pain (bilateral)  Procedure:  Anesthesia, Analgesia, Anxiolysis:  Type: Therapeutic Intra-Articular Hip Injection Region:  Posterolateral hip joint area. Level: Lower pelvic and hip joint level. Laterality: Bilateral  Type: Local Anesthesia Local Anesthetic: Lidocaine 1% Route: Infiltration ([place]/IM) IV Access: Declined Sedation: Declined  Indication(s): Analgesia          Indications: 1. Primary osteoarthritis of both hips   2. Chronic hip pain, left   3. Chronic hip pain, right    Pain Score: Pre-procedure: 6 /10 Post-procedure: 0-No pain/10  Pre-op Assessment:  Previous date of service: 09/25/16 Service provided: Evaluation Regina Campos is a 66 y.o. (year old), female patient, seen today for interventional treatment. She  has a past surgical history that includes Abdominal hysterectomy; Total shoulder replacement; Replacement total knee; Appendectomy; Lumbar fusion; Carpal tunnel release; and Back surgery (07/25/2016). Her primarily concern today is the Hip Pain (bilateral)  Initial Vital Signs: Blood pressure 126/69, pulse 70, temperature 98.2 F (36.8 C), temperature source Oral, resp. rate 16, height 5\' 5"  (1.651 m), weight 219 lb (99.3 kg), SpO2 98 %. BMI: 36.44 kg/m  Risk Assessment: Allergies: Reviewed. She has No Known Allergies.  Allergy Precautions: None required Coagulopathies: "Reviewed. None identified.  Blood-thinner therapy: None at this time Active Infection(s): Reviewed. None identified. Regina Campos is afebrile  Site  Confirmation: Regina Campos was asked to confirm the procedure and laterality before marking the site Procedure checklist: Completed Consent: Before the procedure and under the influence of no sedative(s), amnesic(s), or anxiolytics, the patient was informed of the treatment options, risks and possible complications. To fulfill our ethical and legal obligations, as recommended by the American Medical Association's Code of Ethics, I have informed the patient of my clinical impression; the nature and purpose of the treatment or procedure; the risks, benefits, and possible complications of the intervention; the alternatives, including doing nothing; the risk(s) and benefit(s) of the alternative treatment(s) or procedure(s); and the risk(s) and benefit(s) of doing nothing. The patient was provided information about the general risks and possible complications associated with the procedure. These may include, but are not limited to: failure to achieve desired goals, infection, bleeding, organ or nerve damage, allergic reactions, paralysis, and death. In addition, the patient was informed of those risks and complications associated to the procedure, such as failure to decrease pain; infection; bleeding; organ or nerve damage with subsequent damage to sensory, motor, and/or autonomic systems, resulting in permanent pain, numbness, and/or weakness of one or several areas of the body; allergic reactions; (i.e.: anaphylactic reaction); and/or death. Furthermore, the patient was informed of those risks and complications associated with the medications. These include, but are not limited to: allergic reactions (i.e.: anaphylactic or anaphylactoid reaction(s)); adrenal axis suppression; blood sugar elevation that in diabetics may result in ketoacidosis or comma; water retention that in patients with history of congestive heart failure may result in shortness of breath, pulmonary edema, and decompensation with resultant heart  failure; weight gain; swelling or edema; medication-induced neural toxicity; particulate matter embolism and blood vessel occlusion with resultant organ, and/or nervous system infarction; and/or aseptic necrosis of  one or more joints. Finally, the patient was informed that Medicine is not an exact science; therefore, there is also the possibility of unforeseen or unpredictable risks and/or possible complications that may result in a catastrophic outcome. The patient indicated having understood very clearly. We have given the patient no guarantees and we have made no promises. Enough time was given to the patient to ask questions, all of which were answered to the patient's satisfaction. Regina Campos has indicated that she wanted to continue with the procedure. Attestation: I, the ordering provider, attest that I have discussed with the patient the benefits, risks, side-effects, alternatives, likelihood of achieving goals, and potential problems during recovery for the procedure that I have provided informed consent. Date: 10/01/2016; Time: 10:00 AM  Pre-Procedure Preparation:  Monitoring: As per clinic protocol. Respiration, ETCO2, SpO2, BP, heart rate and rhythm monitor placed and checked for adequate function Safety Precautions: Patient was assessed for positional comfort and pressure points before starting the procedure. Time-out: I initiated and conducted the "Time-out" before starting the procedure, as per protocol. The patient was asked to participate by confirming the accuracy of the "Time Out" information. Verification of the correct person, site, and procedure were performed and confirmed by me, the nursing staff, and the patient. "Time-out" conducted as per Joint Commission's Universal Protocol (UP.01.01.01). "Time-out" Date & Time: 10/01/2016; 1012 hrs.  Description of Procedure Process:   Position: Prone Target Area: Superior aspect of the hip joint cavity, going thru the superior portion of  the capsular ligament. Approach: Posterolateral approach. Area Prepped: Entire Posterolateral hip area. Prepping solution: ChloraPrep (2% chlorhexidine gluconate and 70% isopropyl alcohol) Safety Precautions: Aspiration looking for blood return was conducted prior to all injections. At no point did we inject any substances, as a needle was being advanced. No attempts were made at seeking any paresthesias. Safe injection practices and needle disposal techniques used. Medications properly checked for expiration dates. SDV (single dose vial) medications used. Description of the Procedure: Protocol guidelines were followed. The patient was placed in position over the fluoroscopy table. The target area was identified and the area prepped in the usual manner. Skin & deeper tissues infiltrated with local anesthetic. Appropriate amount of time allowed to pass for local anesthetics to take effect. The procedure needles were then advanced to the target area. Proper needle placement secured. Negative aspiration confirmed. Solution injected in intermittent fashion, asking for systemic symptoms every 0.5cc of injectate. The needles were then removed and the area cleansed, making sure to leave some of the prepping solution back to take advantage of its long term bactericidal properties. Vitals:   10/01/16 1012 10/01/16 1017 10/01/16 1022 10/01/16 1027  BP: 133/88 (!) 149/82 128/77 129/80  Pulse: 70 64 65 66  Resp: 16 15 12 17   Temp:    98 F (36.7 C)  TempSrc:      SpO2: 100% 97% 96% 95%  Weight:      Height:        Start Time: 1012 hrs. End Time: 1027 hrs. Materials:  Needle(s) Type: Regular needle Gauge: 22G Length: 5-in Medication(s): We administered ropivacaine (PF) 2 mg/mL (0.2%), lidocaine (PF), methylPREDNISolone acetate, ropivacaine (PF) 2 mg/mL (0.2%), and methylPREDNISolone acetate. Please see chart orders for dosing details.  Imaging Guidance (Non-Spinal):  Type of Imaging Technique:  Fluoroscopy Guidance (Non-Spinal) Indication(s): Assistance in needle guidance and placement for procedures requiring needle placement in or near specific anatomical locations not easily accessible without such assistance. Exposure Time: Please see nurses notes. Contrast:  Before injecting any contrast, we confirmed that the patient did not have an allergy to iodine, shellfish, or radiological contrast. Once satisfactory needle placement was completed at the desired level, radiological contrast was injected. Contrast injected under live fluoroscopy. No contrast complications. See chart for type and volume of contrast used. Fluoroscopic Guidance: I was personally present during the use of fluoroscopy. "Tunnel Vision Technique" used to obtain the best possible view of the target area. Parallax error corrected before commencing the procedure. "Direction-depth-direction" technique used to introduce the needle under continuous pulsed fluoroscopy. Once target was reached, antero-posterior, oblique, and lateral fluoroscopic projection used confirm needle placement in all planes. Images permanently stored in EMR. Interpretation: I personally interpreted the imaging intraoperatively. Adequate needle placement confirmed in multiple planes. Appropriate spread of contrast into desired area was observed. No evidence of afferent or efferent intravascular uptake. Permanent images saved into the patient's record.  Antibiotic Prophylaxis:  Indication(s): None identified Antibiotic given: None  Post-operative Assessment:  EBL: None Complications: No immediate post-treatment complications observed by team, or reported by patient. Note: The patient tolerated the entire procedure well. A repeat set of vitals were taken after the procedure and the patient was kept under observation following institutional policy, for this type of procedure. Post-procedural neurological assessment was performed, showing return to baseline,  prior to discharge. The patient was provided with post-procedure discharge instructions, including a section on how to identify potential problems. Should any problems arise concerning this procedure, the patient was given instructions to immediately contact us, at any time, without hesitation. In any case, we plan to contact the patient by telephone for a follow-up status report regarding this interventional procedure. Comments:  No additional relevant information.  Plan of Care  Disposition: Discharge home  Discharge Date & Time: 10/01/2016; 1031 hrs.  Physician-requested Follow-up:  Return in about 2 weeks (around 10/15/2016) for Post-Procedure evaluation.  Future Appointments Date Time Provider [place]  11/05/2016 1:15 PM Milinda Pointer, MD [facility] None   Medications ordered for procedure: Meds ordered this encounter  Medications  . methylPREDNISolone acetate (DEPO-MEDROL) injection 80 mg  . methylPREDNISolone acetate (DEPO-MEDROL) injection 80 mg  . lidocaine (PF) (XYLOCAINE) 1 % injection 10 mL  . ropivacaine (PF) 2 mg/mL (0.2%) (NAROPIN) injection 4 mL  . ropivacaine (PF) 2 mg/mL (0.2%) (NAROPIN) injection 4 mL  . ropivacaine (PF) 2 mg/mL (0.2%) (NAROPIN) 2 MG/ML injection    TICE, KORI: cabinet override  . lidocaine (PF) (XYLOCAINE) 1 % injection    TICE, KORI: cabinet override  . methylPREDNISolone acetate (DEPO-MEDROL) 80 MG/ML injection    TICE, KORI: cabinet override  . ropivacaine (PF) 2 mg/mL (0.2%) (NAROPIN) 2 MG/ML injection    TICE, KORI: cabinet override  . methylPREDNISolone acetate (DEPO-MEDROL) 80 MG/ML injection    TICE, KORI: cabinet override   Medications administered: We administered ropivacaine (PF) 2 mg/mL (0.2%), lidocaine (PF), methylPREDNISolone acetate, ropivacaine (PF) 2 mg/mL (0.2%), and methylPREDNISolone acetate.  See the medical record for exact dosing, route, and time of administration.  Lab-work, Procedure(s), & Referral(s)  Ordered: Orders Placed This Encounter  Procedures  . DG C-Arm 1-60 Min-No Report  . Discharge instructions  . Follow-up  . Informed Consent Details: Transcribe to consent form and obtain patient signature  . Provider attestation of informed consent for procedure/surgical case  . Verify informed consent   Imaging Ordered: No results found for this or any previous visit. New Prescriptions   No medications on file   Primary Care Physician: Lenard Simmer,  MD Location: [facility] Note by: Kathlen Brunswick. Dossie Arbour, M.D, DABA, DABAPM, DABPM, DABIPP, FIPP Date: 10/01/2016; Time: 11:24 AM  Disclaimer:  Medicine is not an exact science. The only guarantee in medicine is that nothing is guaranteed. It is important to note that the decision to proceed with this intervention was based on the information collected from the patient. The Data and conclusions were drawn from the patient's questionnaire, the interview, and the physical examination. Because the information was provided in large part by the patient, it cannot be guaranteed that it has not been purposely or unconsciously manipulated. Every effort has been made to obtain as much relevant data as possible for this evaluation. It is important to note that the conclusions that lead to this procedure are derived in large part from the available data. Always take into account that the treatment will also be dependent on availability of resources and existing treatment guidelines, considered by other Pain Management Practitioners as being common knowledge and practice, at the time of the intervention. For Medico-Legal purposes, it is also important to point out that variation in procedural techniques and pharmacological choices are the acceptable norm. The indications, contraindications, technique, and results of the above procedure should only be interpreted and judged by a Board-Certified Interventional Pain Specialist with  extensive familiarity and expertise in the same exact procedure and technique. Attempts at providing opinions without similar or greater experience and expertise than that of the treating physician will be considered as inappropriate and unethical, and shall result in a formal complaint to the state medical board and applicable specialty societies.  Instructions provided at this appointment: Patient Instructions  Post-procedure Information What to expect: Most procedures involve the use of a local anesthetic (numbing medicine), and a steroid (anti-inflammatory medicine).  The local anesthetics may cause temporary numbness and weakness of the legs or arms, depending on the location of the block. This numbness/weakness may last 4-6 hours, depending on the local anesthetic used. In rare instances, it can last up to 24 hours. While numb, you must be very careful not to injure the extremity.  After any procedure, you could expect the pain to get better within 15-20 minutes. This relief is temporary and may last 4-6 hours. Once the local anesthetics wears off, you could experience discomfort, possibly more than usual, for up to 10 (ten) days. In the case of radiofrequencies, it may last up to 6 weeks. Surgeries may take up to 8 weeks for the healing process. The discomfort is due to the irritation caused by needles going through skin and muscle. To minimize the discomfort, we recommend using ice the first day, and heat from then on. The ice should be applied for 15 minutes on, and 15 minutes off. Keep repeating this cycle until bedtime. Avoid applying the ice directly to the skin, to prevent frostbite. Heat should be used daily, until the pain improves (4-10 days). Be careful not to burn yourself.  Occasionally you may experience muscle spasms or cramps. These occur as a consequence of the irritation caused by the needle sticks to the muscle and the blood that will inevitably be lost into the surrounding  muscle tissue. Blood tends to be very irritating to tissues, which tend to react by going into spasm. These spasms may start the same day of your procedure, but they may also take days to develop. This late onset type of spasm or cramp is usually caused by electrolyte imbalances triggered by the steroids, at the  level of the kidney. Cramps and spasms tend to respond well to muscle relaxants, multivitamins (some are triggered by the procedure, but may have their origins in vitamin deficiencies), and "Gatorade", or any sports drinks that can replenish any electrolyte imbalances. (If you are a diabetic, ask your pharmacist to get you a sugar-free brand.) Warm showers or baths may also be helpful. Stretching exercises are highly recommended. General Instructions:  Be alert for signs of possible infection: redness, swelling, heat, red streaks, elevated temperature, and/or fever. These typically appear 4 to 6 days after the procedure. Immediately notify your doctor if you experience unusual bleeding, difficulty breathing, or loss of bowel or bladder control. If you experience increased pain, do not increase your pain medicine intake, unless instructed by your pain physician. Post-Procedure Care:  Be careful in moving about. Muscle spasms in the area of the injection may occur. Applying ice or heat to the area is often helpful. The incidence of spinal headaches after epidural injections ranges between 1.4% and 6%. If you develop a headache that does not seem to respond to conservative therapy, please let your physician know. This can be treated with an epidural blood patch.   Post-procedure numbness or redness is to be expected, however it should average 4 to 6 hours. If numbness and weakness of your extremities begins to develop 4 to 6 hours after your procedure, and is felt to be progressing and worsening, immediately contact your physician.   Diet:  If you experience nausea, do not eat until this sensation goes  away. If you had a "Stellate Ganglion Block" for upper extremity "Reflex Sympathetic Dystrophy", do not eat or drink until your hoarseness goes away. In any case, always start with liquids first and if you tolerate them well, then slowly progress to more solid foods. Activity:  For the first 4 to 6 hours after the procedure, use caution in moving about as you may experience numbness and/or weakness. Use caution in cooking, using household electrical appliances, and climbing steps. If you need to reach your Doctor call our office: [phone]) [phone] Monday-Thursday 8:00 am - 4:00 PM    Fridays: Closed     In case of an emergency: In case of emergency, call 911 or go to the nearest emergency room and have the physician there call us.  Interpretation of Procedure Every nerve block has two components: a diagnostic component, and a treatment component. Unrealistic expectations are the most common causes of "perceived failure".  In a perfect world, a single nerve block should be able to completely and permanently eliminate the pain. Sadly, the world is not perfect.  Most pain management nerve blocks are performed using local anesthetics and steroids. Steroids are responsible for any long-term benefit that you may experience. Their purpose is to decrease any chronic swelling that may exist in the area. Steroids begin to work immediately after being injected. However, most patients will not experience any benefits until 5 to 10 days after the injection, when the swelling has come down to the point where they can tell a difference. Steroids will only help if there is swelling to be treated. As such, they can assist with the diagnosis. If effective, they suggest an inflammatory component to the pain, and if ineffective, they rule out inflammation as the main cause or component of the problem. If the problem is one of mechanical compression, you will get no benefit from those steroids.   In the case of local  anesthetics, they have a crucial  role in the diagnosis of your condition. Most will begin to work within15 to 20 minutes after injection. The duration will depend on the type used (short- vs. Long-acting). It is of outmost importance that patients keep tract of their pain, after the procedure. To assist with this matter, a "Post-procedure Pain Diary" is provided. Make sure to complete it and to bring it back to your follow-up appointment.  As long as the patient keeps accurate, detailed records of their symptoms after every procedure, and returns to have those interpreted, every procedure will provide [place] with invaluable information. Even a block that does not provide the patient with any relief, will always provide [place] with information about the mechanism and the origin of the pain. The only time a nerve block can be considered a waste of time is when patients do not keep track of the results, or do not keep their post-procedure appointment.  Reporting the results back to your physician The Pain Score  Pain is a subjective complaint. It cannot be seen, touched, or measured. We depend entirely on the patient's report of the pain in order to assess your condition and treatment. To evaluate the pain, we use a pain scale, where "0" means "No Pain", and a "10" is "the worst possible pain that you can even imagine" (i.e. something like been eaten alive by a shark or being torn apart by a lion).   You will frequently be asked to rate your pain. Please be as accurate, remember that medical decisions will be based on your responses. Please do not rate your pain above a 10. Doing so is actually interpreted as "symptom magnification" (exaggeration), as well as lack of understanding with regards to the scale. To put this into perspective, when you tell us that your pain is at a 10 (ten), what you are saying is that there is nothing we can do to make this pain any worse. (Carefully think about that.)Post- Procedure  Evaluation Introduction Every nerve block has two possibly beneficial components: a diagnostic and a treatment component. The diagnostic component is short lived, while the treatment (also known as "therapeutic") component is longer lasting. With very few exceptions, local anesthetics are responsible for the diagnostic component, while steroids are responsible for the therapeutic component.  Importance New trends in healthcare regulations demand that a plan of care be formulated and proof of benefit documented. Performing a procedure without proper follow-up is not good medical practice. Studies would suggest that accurate memory recollection diminishes with time. Therefore, it is important that all patients keep their post-procedure follow-up appointments and their "Post-Procedure Pain Diary". Both are essential to the proper evaluation of the treatment and possible adjustments in therapy.  Diagnostic Component This is the most important component of any nerve block. The success of your therapy depends on the proper interpretation of each treatment. In turn, this interpretation is completely dependent on the patient's accurate account of the results experienced during the four key periods after the block. These four key periods include: The "ultra-short term relief of the pain"; the "short term relief"; the "long term relief"; and the "ongoing relief" periods.  These periods are defined as follows: 1. "Ultra-short term" period refers to the period of time starting 10 to 15 minutes after the completion of the procedure, and ending 1 hour after the procedure.  2. "Short term" refers to the period of time starting 10 to 15 minutes after the procedure and ending 4 to 6 hours after the completion of  the procedure. This period of time does overlap "Ultra-short term" period. 3. "Long term" refers to a period of time which may start with the procedure, or may start 4 to 10 days after the procedure, and may  last past 14 days after the treatment.  4. "Ongoing relief" refers to that relief that continues to be present at the time of the post-procedure evaluation. In the past, this was the only type of relief reported by patients at the time of their post-procedure evaluation.  Therapeutic (treatment) Component This is that portion of the nerve block which has the potential to provide long lasting benefit. With very few exceptions, steroids are responsible for this part of the benefit.  Nerve Block Medication(s) The two main medications found in most nerve blocks include: a local anesthetic and a steroid.  The local anesthetic (or numbing medicine) is the component that renders the injected area numb for a period of time. Local anesthetics begin to work (numb) within 15 minutes of being injected. The duration of this numbness is quite predictable and dependent on the type of local anesthetic used. The expected result are so predictable and accurate that lack of pain relief at the injected site can be taken as proof that the pain is not coming from it. A nerve block without local anesthetics is not considered to be a "nerve block" since the local anesthetics are the only component in the mixture that can "block" nerve conduction. A nerve block without local anesthetics is just an injection, not a nerve block. Local anesthetics carry the bulk of the diagnostic portion of the procedure. Local anesthetics are responsible for the "ultra-short term relief", as well as the "short term relief" of the nerve block. The second medication found in a typical nerve block are the steroids. Contrary to local anesthetics, the steroids have no numbing effect. The way steroids provide relief of the pain is by decreasing the swelling associated with the pain. On the average, this process takes 4 to 10 days. The addition of steroids is not necessary for a nerve block, however, not adding the steroids would limit the nerve block to  simply being diagnostic, but not therapeutic. Steroids should be avoided in patients with known contraindications to their use.  Failure of Therapy Unrealistic expectations are the most common causes of "perceived failure".  In a perfect world, a single nerve block should be able to completely and permanently eliminate the pain. Sadly, the world is not perfect. All nerve blocks, including those that do not provide relief of the pain, provide [place] with information and therefore benefit. The only nerve blocks that are considered to be a waste of time are those where the patient does not return for an evaluation. These includes those where the patient attains 100% relief of the pain for a long time. In these case, the information from this successful treatment is lost. If the pain returns several years later, there will be no documentation that the therapy helped.   Interpretation of Results 1. Ultra-short term relief - (initial 1 hour after procedure) In those cases where no sedation is used, the results are interpreted based on the effects of the local anesthetics. In those cases where sedation is requested, consideration must be given to the sedation, as well as the local anesthetic effects. There are three (3) possible outcomes: a. 100% relief - Two (2) possible reasons i. If no sedation was given: Then the benefit can only come from the local anesthetics. This would  indicate that 100% of the pain comes from the injected area. ii. If intravenous (IV) sedation was given: Then the relief may be due to the sedation or to a combination of the sedation and the injected local anesthetics. Further evaluation of the "Short term relief period" would be needed to narrow down which one is responsible. b. 0% relief - Two (2) possible reasons i. If no sedation was given: Then this result would indicate that the reported pain is not coming from injected area. ii. If IV sedation was given: This result would suggest that  not only is the pain not coming from the injected area, but in addition the class of sedatives administered would not be beneficial for the type of pain being treated. c. Some relief, but not complete (anything between 0 and 100% relief) - Two (2) possible reasons i. If no sedation was given: This would suggest that whatever percentage of the pain improved, then that portion comes from the injected area, while any pain that did not improve is possibly coming from somewhere else. ii. If IV sedation was provided: Here, the partial improvement of the pain would suggest poor effectiveness of the sedation in addition to either no relief from the local anesthetics, or partial relief, once more suggesting that the injected site is not responsible for 100% of the reported pain. 2. Short Term Relief - (initial 4 to 6 hours after procedure) Because by now the effects of the IV sedation should be gone, the results are completely and solely dependent on the effects of the local anesthetic over the injected site. Again, here there are three (3) possible outcomes: a. 100% relief -.All of the pain is coming from the injected site. b. 0% relief - None of the pain is coming from the injected site. c. Some relief, but not complete (anything between 0 and 100% relief) - This would suggest that the injected site is partially responsible for the pain. It would also suggest that there is some pain that remains unaccounted for. 3. Long Term Relief - (starting 4 to 10 days after procedure) Because the local anesthetic effect is long gone by this time, any benefit experienced during this period is due to the other medications present in the nerve block injection. Most of the time this is due to the steroids. Again, here there are three (3) possible outcomes: a. 100% relief -.All of the pain is coming from the injected site. b. 0% relief - None of the pain is coming from the injected site. c. Some relief, but not complete  (anything between 0 and 100% relief) - This would suggest that the injected site is partially responsible for the pain. It would also suggest that there is some pain that remains unaccounted for.  Most pain management nerve blocks are performed using local anesthetics and steroids. Steroids are responsible for any long-term benefit that you may experience. Their purpose is to decrease any chronic swelling that may exist in the area. Steroids begin to work immediately after being injected. However, most patients will not experience any benefits until 5 to 10 days after the injection, when the swelling has come down to the point where they can tell a difference. Steroids will only help if there is swelling to be treated. As such, they can assist with the diagnosis. If effective, they suggest an inflammatory component to the pain, and if ineffective, they rule out inflammation as the main cause or component of the problem. If the problem is one  of mechanical compression, you will get no benefit from those steroids.   In the case of local anesthetics, they have a crucial role in the diagnosis of your condition. Most will begin to work within15 to 20 minutes after injection. The duration will depend on the type used (short- vs. Long-acting). It is of outmost importance that patients keep tract of their pain, after the procedure. To assist with this matter, a "Post-procedure Pain Diary" is provided. Make sure to complete it and to bring it back to your follow-up appointment.  As long as the patient keeps accurate, detailed records of their symptoms after every procedure, and returns to have those interpreted, every procedure will provide [place] with invaluable information. Even a block that does not provide the patient with any relief, will always provide [place] with information about the mechanism and the origin of the pain. The only time a nerve block can be considered a waste of time is when patients do not keep track of  the results, or do not keep their post-procedure appointment.  Reporting the results back to your physician The Pain Score Pain is a subjective complaint. It cannot be seen, touched, or measured. We depend entirely on the patient's report of the pain in order to assess your condition and treatment. To evaluate the pain, we use a pain scale, where "0" means "No Pain", and a "10" is "the worst possible pain that you can even imagine" (i.e. something like been eaten alive by a shark or being torn apart by a lion).  You will frequently be asked to rate your pain. Please be as accurate, remember that medical decisions will be based on your responses. Please do not rate your pain above a 10. Doing so is actually interpreted as "symptom magnification" (exaggeration), as well as lack of understanding with regards to the scale. To put this into perspective, when you tell us that your pain is at a 10 (ten), what you are saying is that there is nothing we can do to make this pain any worse. (Carefully think about that.)Pain Management Discharge Instructions  General Discharge Instructions :  If you need to reach your doctor call: Monday-Friday 8:00 am - 4:00 pm at [phone] or toll free [phone].  After clinic hours [phone] to have operator reach doctor.  Bring all of your medication bottles to all your appointments in the pain clinic.  To cancel or reschedule your appointment with Pain Management please remember to call 24 hours in advance to avoid a fee.  Refer to the educational materials which you have been given on: General Risks, I had my Procedure. Discharge Instructions, Post Sedation.  Post Procedure Instructions:  The drugs you were given will stay in your system until tomorrow, so for the next 24 hours you should not drive, make any legal decisions or drink any alcoholic beverages.  You may eat anything you prefer, but it is better to start with liquids then soups and crackers,  and gradually work up to solid foods.  Please notify your doctor immediately if you have any unusual bleeding, trouble breathing or pain that is not related to your normal pain.  Depending on the type of procedure that was done, some parts of your body may feel week and/or numb.  This usually clears up by tonight or the next day.  Walk with the use of an assistive device or accompanied by an adult for the 24 hours.  You may use ice on the affected area for  the first 24 hours.  Put ice in a Ziploc bag and cover with a towel and place against area 15 minutes on 15 minutes off.  You may switch to heat after 24 hours.

## 2016-10-01 NOTE — Progress Notes (Signed)
Safety precautions to be maintained throughout the outpatient stay will include: orient to surroundings, keep bed in low position, maintain call bell within reach at all times, provide assistance with transfer out of bed and ambulation.  

## 2016-10-01 NOTE — Patient Instructions (Signed)
Post-procedure Information What to expect: Most procedures involve the use of a local anesthetic (numbing medicine), and a steroid (anti-inflammatory medicine).  The local anesthetics may cause temporary numbness and weakness of the legs or arms, depending on the location of the block. This numbness/weakness may last 4-6 hours, depending on the local anesthetic used. In rare instances, it can last up to 24 hours. While numb, you must be very careful not to injure the extremity.  After any procedure, you could expect the pain to get better within 15-20 minutes. This relief is temporary and may last 4-6 hours. Once the local anesthetics wears off, you could experience discomfort, possibly more than usual, for up to 10 (ten) days. In the case of radiofrequencies, it may last up to 6 weeks. Surgeries may take up to 8 weeks for the healing process. The discomfort is due to the irritation caused by needles going through skin and muscle. To minimize the discomfort, we recommend using ice the first day, and heat from then on. The ice should be applied for 15 minutes on, and 15 minutes off. Keep repeating this cycle until bedtime. Avoid applying the ice directly to the skin, to prevent frostbite. Heat should be used daily, until the pain improves (4-10 days). Be careful not to burn yourself.  Occasionally you may experience muscle spasms or cramps. These occur as a consequence of the irritation caused by the needle sticks to the muscle and the blood that will inevitably be lost into the surrounding muscle tissue. Blood tends to be very irritating to tissues, which tend to react by going into spasm. These spasms may start the same day of your procedure, but they may also take days to develop. This late onset type of spasm or cramp is usually caused by electrolyte imbalances triggered by the steroids, at the level of the kidney. Cramps and spasms tend to respond well to muscle relaxants, multivitamins (some are  triggered by the procedure, but may have their origins in vitamin deficiencies), and "Gatorade", or any sports drinks that can replenish any electrolyte imbalances. (If you are a diabetic, ask your pharmacist to get you a sugar-free brand.) Warm showers or baths may also be helpful. Stretching exercises are highly recommended. General Instructions:  Be alert for signs of possible infection: redness, swelling, heat, red streaks, elevated temperature, and/or fever. These typically appear 4 to 6 days after the procedure. Immediately notify your doctor if you experience unusual bleeding, difficulty breathing, or loss of bowel or bladder control. If you experience increased pain, do not increase your pain medicine intake, unless instructed by your pain physician. Post-Procedure Care:  Be careful in moving about. Muscle spasms in the area of the injection may occur. Applying ice or heat to the area is often helpful. The incidence of spinal headaches after epidural injections ranges between 1.4% and 6%. If you develop a headache that does not seem to respond to conservative therapy, please let your physician know. This can be treated with an epidural blood patch.   Post-procedure numbness or redness is to be expected, however it should average 4 to 6 hours. If numbness and weakness of your extremities begins to develop 4 to 6 hours after your procedure, and is felt to be progressing and worsening, immediately contact your physician.   Diet:  If you experience nausea, do not eat until this sensation goes away. If you had a "Stellate Ganglion Block" for upper extremity "Reflex Sympathetic Dystrophy", do not eat or drink until your   hoarseness goes away. In any case, always start with liquids first and if you tolerate them well, then slowly progress to more solid foods. Activity:  For the first 4 to 6 hours after the procedure, use caution in moving about as you may experience numbness and/or weakness. Use caution in  cooking, using household electrical appliances, and climbing steps. If you need to reach your Doctor call our office: [phone] Monday-Thursday 8:00 am - 4:00 PM    Fridays: Closed     In case of an emergency: In case of emergency, call 911 or go to the nearest emergency room and have the physician there call us.  Interpretation of Procedure Every nerve block has two components: a diagnostic component, and a treatment component. Unrealistic expectations are the most common causes of "perceived failure".  In a perfect world, a single nerve block should be able to completely and permanently eliminate the pain. Sadly, the world is not perfect.  Most pain management nerve blocks are performed using local anesthetics and steroids. Steroids are responsible for any long-term benefit that you may experience. Their purpose is to decrease any chronic swelling that may exist in the area. Steroids begin to work immediately after being injected. However, most patients will not experience any benefits until 5 to 10 days after the injection, when the swelling has come down to the point where they can tell a difference. Steroids will only help if there is swelling to be treated. As such, they can assist with the diagnosis. If effective, they suggest an inflammatory component to the pain, and if ineffective, they rule out inflammation as the main cause or component of the problem. If the problem is one of mechanical compression, you will get no benefit from those steroids.   In the case of local anesthetics, they have a crucial role in the diagnosis of your condition. Most will begin to work within15 to 20 minutes after injection. The duration will depend on the type used (short- vs. Long-acting). It is of outmost importance that patients keep tract of their pain, after the procedure. To assist with this matter, a "Post-procedure Pain Diary" is provided. Make sure to complete it and to bring it back to your  follow-up appointment.  As long as the patient keeps accurate, detailed records of their symptoms after every procedure, and returns to have those interpreted, every procedure will provide us with invaluable information. Even a block that does not provide the patient with any relief, will always provide us with information about the mechanism and the origin of the pain. The only time a nerve block can be considered a waste of time is when patients do not keep track of the results, or do not keep their post-procedure appointment.  Reporting the results back to your physician The Pain Score  Pain is a subjective complaint. It cannot be seen, touched, or measured. We depend entirely on the patient's report of the pain in order to assess your condition and treatment. To evaluate the pain, we use a pain scale, where "0" means "No Pain", and a "10" is "the worst possible pain that you can even imagine" (i.e. something like been eaten alive by a shark or being torn apart by a lion).   You will frequently be asked to rate your pain. Please be as accurate, remember that medical decisions will be based on your responses. Please do not rate your pain above a 10. Doing so is actually interpreted as "symptom magnification" (exaggeration), as   well as lack of understanding with regards to the scale. To put this into perspective, when you tell Regina Campos that your pain is at a 10 (ten), what you are saying is that there is nothing we can do to make this pain any worse. (Carefully think about that.)Post- Procedure Evaluation Introduction Every nerve block has two possibly beneficial components: a diagnostic and a treatment component. The diagnostic component is short lived, while the treatment (also known as "therapeutic") component is longer lasting. With very few exceptions, local anesthetics are responsible for the diagnostic component, while steroids are responsible for the therapeutic component.  Importance New trends in  healthcare regulations demand that a plan of care be formulated and proof of benefit documented. Performing a procedure without proper follow-up is not good medical practice. Studies would suggest that accurate memory recollection diminishes with time. Therefore, it is important that all patients keep their post-procedure follow-up appointments and their "Post-Procedure Pain Diary". Both are essential to the proper evaluation of the treatment and possible adjustments in therapy.  Diagnostic Component This is the most important component of any nerve block. The success of your therapy depends on the proper interpretation of each treatment. In turn, this interpretation is completely dependent on the patient's accurate account of the results experienced during the four key periods after the block. These four key periods include: The "ultra-short term relief of the pain"; the "short term relief"; the "long term relief"; and the "ongoing relief" periods.  These periods are defined as follows: 1. "Ultra-short term" period refers to the period of time starting 10 to 15 minutes after the completion of the procedure, and ending 1 hour after the procedure.  2. "Short term" refers to the period of time starting 10 to 15 minutes after the procedure and ending 4 to 6 hours after the completion of the procedure. This period of time does overlap "Ultra-short term" period. 3. "Long term" refers to a period of time which may start with the procedure, or may start 4 to 10 days after the procedure, and may last past 14 days after the treatment.  4. "Ongoing relief" refers to that relief that continues to be present at the time of the post-procedure evaluation. In the past, this was the only type of relief reported by patients at the time of their post-procedure evaluation.  Therapeutic (treatment) Component This is that portion of the nerve block which has the potential to provide long lasting benefit. With very few  exceptions, steroids are responsible for this part of the benefit.  Nerve Block Medication(s) The two main medications found in most nerve blocks include: a local anesthetic and a steroid.  The local anesthetic (or numbing medicine) is the component that renders the injected area numb for a period of time. Local anesthetics begin to work (numb) within 15 minutes of being injected. The duration of this numbness is quite predictable and dependent on the type of local anesthetic used. The expected result are so predictable and accurate that lack of pain relief at the injected site can be taken as proof that the pain is not coming from it. A nerve block without local anesthetics is not considered to be a "nerve block" since the local anesthetics are the only component in the mixture that can "block" nerve conduction. A nerve block without local anesthetics is just an injection, not a nerve block. Local anesthetics carry the bulk of the diagnostic portion of the procedure. Local anesthetics are responsible for the "ultra-short term relief",  as well as the "short term relief" of the nerve block. The second medication found in a typical nerve block are the steroids. Contrary to local anesthetics, the steroids have no numbing effect. The way steroids provide relief of the pain is by decreasing the swelling associated with the pain. On the average, this process takes 4 to 10 days. The addition of steroids is not necessary for a nerve block, however, not adding the steroids would limit the nerve block to simply being diagnostic, but not therapeutic. Steroids should be avoided in patients with known contraindications to their use.  Failure of Therapy Unrealistic expectations are the most common causes of "perceived failure".  In a perfect world, a single nerve block should be able to completely and permanently eliminate the pain. Sadly, the world is not perfect. All nerve blocks, including those that do not provide  relief of the pain, provide Regina Campos with information and therefore benefit. The only nerve blocks that are considered to be a waste of time are those where the patient does not return for an evaluation. These includes those where the patient attains 100% relief of the pain for a long time. In these case, the information from this successful treatment is lost. If the pain returns several years later, there will be no documentation that the therapy helped.   Interpretation of Results 1. Ultra-short term relief - (initial 1 hour after procedure) In those cases where no sedation is used, the results are interpreted based on the effects of the local anesthetics. In those cases where sedation is requested, consideration must be given to the sedation, as well as the local anesthetic effects. There are three (3) possible outcomes: a. 100% relief - Two (2) possible reasons i. If no sedation was given: Then the benefit can only come from the local anesthetics. This would indicate that 100% of the pain comes from the injected area. ii. If intravenous (IV) sedation was given: Then the relief may be due to the sedation or to a combination of the sedation and the injected local anesthetics. Further evaluation of the "Short term relief period" would be needed to narrow down which one is responsible. b. 0% relief - Two (2) possible reasons i. If no sedation was given: Then this result would indicate that the reported pain is not coming from injected area. ii. If IV sedation was given: This result would suggest that not only is the pain not coming from the injected area, but in addition the class of sedatives administered would not be beneficial for the type of pain being treated. c. Some relief, but not complete (anything between 0 and 100% relief) - Two (2) possible reasons i. If no sedation was given: This would suggest that whatever percentage of the pain improved, then that portion comes from the injected area, while any  pain that did not improve is possibly coming from somewhere else. ii. If IV sedation was provided: Here, the partial improvement of the pain would suggest poor effectiveness of the sedation in addition to either no relief from the local anesthetics, or partial relief, once more suggesting that the injected site is not responsible for 100% of the reported pain. 2. Short Term Relief - (initial 4 to 6 hours after procedure) Because by now the effects of the IV sedation should be gone, the results are completely and solely dependent on the effects of the local anesthetic over the injected site. Again, here there are three (3) possible outcomes: a. 100% relief -.All  of the pain is coming from the injected site. b. 0% relief - None of the pain is coming from the injected site. c. Some relief, but not complete (anything between 0 and 100% relief) - This would suggest that the injected site is partially responsible for the pain. It would also suggest that there is some pain that remains unaccounted for. 3. Long Term Relief - (starting 4 to 10 days after procedure) Because the local anesthetic effect is long gone by this time, any benefit experienced during this period is due to the other medications present in the nerve block injection. Most of the time this is due to the steroids. Again, here there are three (3) possible outcomes: a. 100% relief -.All of the pain is coming from the injected site. b. 0% relief - None of the pain is coming from the injected site. c. Some relief, but not complete (anything between 0 and 100% relief) - This would suggest that the injected site is partially responsible for the pain. It would also suggest that there is some pain that remains unaccounted for.  Most pain management nerve blocks are performed using local anesthetics and steroids. Steroids are responsible for any long-term benefit that you may experience. Their purpose is to decrease any chronic swelling that may exist in  the area. Steroids begin to work immediately after being injected. However, most patients will not experience any benefits until 5 to 10 days after the injection, when the swelling has come down to the point where they can tell a difference. Steroids will only help if there is swelling to be treated. As such, they can assist with the diagnosis. If effective, they suggest an inflammatory component to the pain, and if ineffective, they rule out inflammation as the main cause or component of the problem. If the problem is one of mechanical compression, you will get no benefit from those steroids.   In the case of local anesthetics, they have a crucial role in the diagnosis of your condition. Most will begin to work within15 to 20 minutes after injection. The duration will depend on the type used (short- vs. Long-acting). It is of outmost importance that patients keep tract of their pain, after the procedure. To assist with this matter, a "Post-procedure Pain Diary" is provided. Make sure to complete it and to bring it back to your follow-up appointment.  As long as the patient keeps accurate, detailed records of their symptoms after every procedure, and returns to have those interpreted, every procedure will provide Regina Campos with invaluable information. Even a block that does not provide the patient with any relief, will always provide Regina Campos with information about the mechanism and the origin of the pain. The only time a nerve block can be considered a waste of time is when patients do not keep track of the results, or do not keep their post-procedure appointment.  Reporting the results back to your physician The Pain Score Pain is a subjective complaint. It cannot be seen, touched, or measured. We depend entirely on the patient's report of the pain in order to assess your condition and treatment. To evaluate the pain, we use a pain scale, where "0" means "No Pain", and a "10" is "the worst possible pain that you can  even imagine" (i.e. something like been eaten alive by a shark or being torn apart by a lion).  You will frequently be asked to rate your pain. Please be as accurate, remember that medical decisions will be based  on your responses. Please do not rate your pain above a 10. Doing so is actually interpreted as "symptom magnification" (exaggeration), as well as lack of understanding with regards to the scale. To put this into perspective, when you tell Regina Campos that your pain is at a 10 (ten), what you are saying is that there is nothing we can do to make this pain any worse. (Carefully think about that.)Pain Management Discharge Instructions  General Discharge Instructions :  If you need to reach your doctor call: Monday-Friday 8:00 am - 4:00 pm at [phone] or toll free [phone].  After clinic hours [phone] to have operator reach doctor.  Bring all of your medication bottles to all your appointments in the pain clinic.  To cancel or reschedule your appointment with Pain Management please remember to call 24 hours in advance to avoid a fee.  Refer to the educational materials which you have been given on: General Risks, I had my Procedure. Discharge Instructions, Post Sedation.  Post Procedure Instructions:  The drugs you were given will stay in your system until tomorrow, so for the next 24 hours you should not drive, make any legal decisions or drink any alcoholic beverages.  You may eat anything you prefer, but it is better to start with liquids then soups and crackers, and gradually work up to solid foods.  Please notify your doctor immediately if you have any unusual bleeding, trouble breathing or pain that is not related to your normal pain.  Depending on the type of procedure that was done, some parts of your body may feel week and/or numb.  This usually clears up by tonight or the next day.  Walk with the use of an assistive device or accompanied by an adult for the 24  hours.  You may use ice on the affected area for the first 24 hours.  Put ice in a Ziploc bag and cover with a towel and place against area 15 minutes on 15 minutes off.  You may switch to heat after 24 hours.

## 2016-10-02 ENCOUNTER — Telehealth: Payer: Self-pay | Admitting: *Deleted

## 2016-10-02 NOTE — Telephone Encounter (Signed)
Attempted to call for post procedure follow-up. Message left. 

## 2016-11-05 ENCOUNTER — Encounter: Payer: Self-pay | Admitting: Pain Medicine

## 2016-11-05 ENCOUNTER — Ambulatory Visit: Payer: Medicare Other | Attending: Pain Medicine | Admitting: Pain Medicine

## 2016-11-05 VITALS — BP 129/54 | HR 65 | Temp 98.0°F | Resp 18 | Ht 60.0 in | Wt 220.0 lb

## 2016-11-05 DIAGNOSIS — G8929 Other chronic pain: Secondary | ICD-10-CM

## 2016-11-05 DIAGNOSIS — G894 Chronic pain syndrome: Secondary | ICD-10-CM | POA: Insufficient documentation

## 2016-11-05 DIAGNOSIS — Z79891 Long term (current) use of opiate analgesic: Secondary | ICD-10-CM | POA: Insufficient documentation

## 2016-11-05 DIAGNOSIS — Z79899 Other long term (current) drug therapy: Secondary | ICD-10-CM | POA: Insufficient documentation

## 2016-11-05 DIAGNOSIS — M25511 Pain in right shoulder: Secondary | ICD-10-CM | POA: Diagnosis not present

## 2016-11-05 DIAGNOSIS — Z7982 Long term (current) use of aspirin: Secondary | ICD-10-CM | POA: Diagnosis not present

## 2016-11-05 DIAGNOSIS — M19011 Primary osteoarthritis, right shoulder: Secondary | ICD-10-CM | POA: Diagnosis not present

## 2016-11-05 DIAGNOSIS — Z5181 Encounter for therapeutic drug level monitoring: Secondary | ICD-10-CM | POA: Diagnosis not present

## 2016-11-05 DIAGNOSIS — Z96611 Presence of right artificial shoulder joint: Secondary | ICD-10-CM | POA: Diagnosis not present

## 2016-11-05 DIAGNOSIS — M4802 Spinal stenosis, cervical region: Secondary | ICD-10-CM | POA: Diagnosis not present

## 2016-11-05 DIAGNOSIS — M25512 Pain in left shoulder: Secondary | ICD-10-CM

## 2016-11-05 DIAGNOSIS — M19012 Primary osteoarthritis, left shoulder: Secondary | ICD-10-CM | POA: Diagnosis not present

## 2016-11-05 DIAGNOSIS — M9981 Other biomechanical lesions of cervical region: Secondary | ICD-10-CM

## 2016-11-05 NOTE — Progress Notes (Signed)
Safety precautions to be maintained throughout the outpatient stay will include: orient to surroundings, keep bed in low position, maintain call bell within reach at all times, provide assistance with transfer out of bed and ambulation.  

## 2016-11-05 NOTE — Patient Instructions (Addendum)
Keep your next scheduled appointment.Preparing for your procedure (without sedation) Instructions: . Oral Intake: Do not eat or drink anything for at least 3 hours prior to your procedure. . Transportation: Unless otherwise stated by your physician, you may drive yourself after the procedure. . Blood Pressure Medicine: Take your blood pressure medicine with a sip of water the morning of the procedure. . Blood thinners:  . Diabetics on insulin: Notify the staff so that you can be scheduled 1st case in the morning. If your diabetes requires high dose insulin, take only  of your normal insulin dose the morning of the procedure and notify the staff that you have done so. . Preventing infections: Shower with an antibacterial soap the morning of your procedure.  . Build-up your immune system: Take 1000 mg of Vitamin C with every meal (3 times a day) the day prior to your procedure. [facility] Antibiotics: Inform the staff if you have a condition or reason that requires you to take antibiotics before dental procedures. . Pregnancy: If you are pregnant, call and cancel the procedure. . Sickness: If you have a cold, fever, or any active infections, call and cancel the procedure. . Arrival: You must be in the facility at least 30 minutes prior to your scheduled procedure. . Children: Do not bring any children with you. . Dress appropriately: Bring dark clothing that you would not mind if they get stained. . Valuables: Do not bring any jewelry or valuables. Procedure appointments are reserved for interventional treatments only. [place] Kitchen No Prescription Refills. . No medication changes will be discussed during procedure appointments. . No disability issues will be discussed.  ____________________________________________________________________________________________  Pain Score  Introduction: The pain score used by this practice is the Verbal Numerical Rating Scale (VNRS-11). This is an 11-point scale. It is for  adults and children 10 years or older. There are significant differences in how the pain score is reported, used, and applied. Forget everything you learned in the past and learn this scoring system.  General Information: The scale should reflect your current level of pain. Unless you are specifically asked for the level of your worst pain, or your average pain. If you are asked for one of these two, then it should be understood that it is over the past 24 hours.  Basic Activities of Daily Living (ADL): Personal hygiene, dressing, eating, transferring, and using restroom.  Instructions: Most patients tend to report their level of pain as a combination of two factors, their physical pain and their psychosocial pain. This last one is also known as "suffering" and it is reflection of how physical pain affects you socially and psychologically. From now on, report them separately. From this point on, when asked to report your pain level, report only your physical pain. Use the following table for reference.  Pain Clinic Pain Levels (0-5/10)  Pain Level Score Description  No Pain 0   Mild pain 1 Nagging, annoying, but does not interfere with basic activities of daily living (ADL). Patients are able to eat, bathe, get dressed, toileting (being able to get on and off the toilet and perform personal hygiene functions), transfer (move in and out of bed or a chair without assistance), and maintain continence (able to control bladder and bowel functions). Blood pressure and heart rate are unaffected. A normal heart rate for a healthy adult ranges from 60 to 100 bpm (beats per minute).   Mild to moderate pain 2 Noticeable and distracting. Impossible to hide from other people. More  frequent flare-ups. Still possible to adapt and function close to normal. It can be very annoying and may have occasional stronger flare-ups. With discipline, patients may get used to it and adapt.   Moderate pain 3 Interferes  significantly with activities of daily living (ADL). It becomes difficult to feed, bathe, get dressed, get on and off the toilet or to perform personal hygiene functions. Difficult to get in and out of bed or a chair without assistance. Very distracting. With effort, it can be ignored when deeply involved in activities.   Moderately severe pain 4 Impossible to ignore for more than a few minutes. With effort, patients may still be able to manage work or participate in some social activities. Very difficult to concentrate. Signs of autonomic nervous system discharge are evident: dilated pupils (mydriasis); mild sweating (diaphoresis); sleep interference. Heart rate becomes elevated (>115 bpm). Diastolic blood pressure (lower number) rises above 100 mmHg. Patients find relief in laying down and not moving.   Severe pain 5 Intense and extremely unpleasant. Associated with frowning face and frequent crying. Pain overwhelms the senses.  Ability to do any activity or maintain social relationships becomes significantly limited. Conversation becomes difficult. Pacing back and forth is common, as getting into a comfortable position is nearly impossible. Pain wakes you up from deep sleep. Physical signs will be obvious: pupillary dilation; increased sweating; goosebumps; brisk reflexes; cold, clammy hands and feet; nausea, vomiting or dry heaves; loss of appetite; significant sleep disturbance with inability to fall asleep or to remain asleep. When persistent, significant weight loss is observed due to the complete loss of appetite and sleep deprivation.  Blood pressure and heart rate becomes significantly elevated. Caution: If elevated blood pressure triggers a pounding headache associated with blurred vision, then the patient should immediately seek attention at an urgent or emergency care unit, as these may be signs of an impending stroke.    Emergency Department Pain Levels (6-10/10)  Emergency Room Pain 6  Severely limiting. Requires emergency care and should not be seen or managed at an outpatient pain management facility. Communication becomes difficult and requires great effort. Assistance to reach the emergency department may be required. Facial flushing and profuse sweating along with potentially dangerous increases in heart rate and blood pressure will be evident.   Distressing pain 7 Self-care is very difficult. Assistance is required to transport, or use restroom. Assistance to reach the emergency department will be required. Tasks requiring coordination, such as bathing and getting dressed become very difficult.   Disabling pain 8 Self-care is no longer possible. At this level, pain is disabling. The individual is unable to do even the most "basic" activities such as walking, eating, bathing, dressing, transferring to a bed, or toileting. Fine motor skills are lost. It is difficult to think clearly.   Incapacitating pain 9 Pain becomes incapacitating. Thought processing is no longer possible. Difficult to remember your own name. Control of movement and coordination are lost.   The worst pain imaginable 10 At this level, most patients pass out from pain. When this level is reached, collapse of the autonomic nervous system occurs, leading to a sudden drop in blood pressure and heart rate. This in turn results in a temporary and dramatic drop in blood flow to the brain, leading to a loss of consciousness. Fainting is one of the body's self defense mechanisms. Passing out puts the brain in a calmed state and causes it to shut down for a while, in order to begin the healing  process.    Summary: 1. Refer to this scale when providing [place] with your pain level. 2. Be accurate and careful when reporting your pain level. This will help with your care. 3. Over-reporting your pain level will lead to loss of credibility. 4. Even a level of 1/10 means that there is pain and will be treated at our  facility. 5. High, inaccurate reporting will be documented as "Symptom Exaggeration", leading to loss of credibility and suspicions of possible secondary gains such as obtaining more narcotics, or wanting to appear disabled, for fraudulent reasons. 6. Only pain levels of 5 or below will be seen at our facility. 7. Pain levels of 6 and above will be sent to the Emergency Department and the appointment cancelled.  Do not eat or drink 3 hours before your appointment. _____________________________________________________________________________________________

## 2016-11-05 NOTE — Progress Notes (Signed)
Patient's Name: Regina Campos  MRN: [id number]  Referring Provider: Lenard Simmer, MD  DOB: 1950-09-24  PCP: Lenard Simmer, MD  DOS: 11/05/2016  Note by: Kathlen Brunswick. Dossie Arbour, MD  Service setting: Ambulatory outpatient  Specialty: Interventional Pain Management  Location: [facility] (AMB) Pain Management Facility    Patient type: Established   Primary Reason(s) for Visit: Encounter for post-procedure evaluation of chronic illness with mild to moderate exacerbation CC: Shoulder Pain (bilateral)  HPI  Regina Campos is a 66 y.o. year old, female patient, who comes today for a post-procedure evaluation. She has Encounter for therapeutic drug level monitoring; Long term current use of opiate analgesic; Uncomplicated opioid dependence ([place]); Opiate use (40 MME/Day); Chronic neck pain (Location of Primary Source of Pain); Chronic low back pain (Location of Secondary source of pain); Lumbar radicular pain; Lumbar spondylosis with radicular symptoms; Chronic pain syndrome; Pulmonary emphysema ([place]); Atypical migraine; Apnea, sleep; High cholesterol; Hypothyroidism; History of cervical spinal surgery; Cervical foraminal stenosis; Cervicogenic headache; Fibromyalgia; History of cardiac arrhythmia; Depression; Obesity; 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); Cervical central spinal stenosis; Long term prescription opiate use; Myofascial pain; Cervical paraspinal muscle spasm; Cervical spondylosis with radiculopathy (Right side); 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 Her primarily concern today is the Shoulder Pain (bilateral)  Pain Assessment: Self-Reported Pain Score: 6 /10 Clinically the patient looks like a 2/10 Reported level is inconsistent with clinical observations. Information on the proper use of the pain scale provided to the patient today Pain Type: Chronic pain Pain Location: Shoulder Pain Orientation: Right Pain Descriptors / Indicators: Numbness, Throbbing Pain Frequency: Constant  Regina Campos comes in today for post-procedure evaluation after the treatment done on 10/01/2016.  Further details on both, my assessment(s), as well as the proposed treatment plan, please see below.  Post-Procedure Assessment  10/01/2016 Procedure: Diagnostic bilateral intra-articular hip joint injection under fluoroscopic guidance, no sedation Post-procedure pain score: 0/10 (100% relief) Influential Factors: BMI: 42.97 kg/m Intra-procedural challenges: None observed Assessment challenges: Results reported today are inconsistent with those reported on procedure day, immediately before discharge. Previously the patient had reported 100% relief of the pain, before leaving the facility Post-procedural side-effects, adverse reactions, or complications: None reported Reported issues: None  Sedation: No sedation used. When no sedatives are used, the analgesic levels obtained are directly associated to the effectiveness of the local anesthetics. However, when sedation is provided, the level of analgesia obtained during the initial 1 hour following the intervention, is believed to be the result of a combination of factors. These factors may include, but are not limited to: 1. The effectiveness of the local anesthetics used. 2. The effects of the analgesic(s) and/or anxiolytic(s) used. 3.  The degree of discomfort experienced by the patient at the time of the procedure. 4. The patients ability and reliability in recalling and recording the events. 5. The presence and influence of possible secondary gains and/or psychosocial factors. Reported result: Relief experienced during the 1st hour after the procedure: 80 % (Ultra-Short Term Relief) Interpretative annotation: No Analgesic or Anxiolytic given,  therefore benefits are completely due to Local Anesthetics.          Effects of local anesthetic: The analgesic effects attained during this period are directly associated to the localized infiltration of local anesthetics and therefore cary significant diagnostic value as to the etiological location, or anatomical origin, of the pain. Expected duration of relief is directly dependent on the pharmacodynamics of the local anesthetic used. Long-acting (4-6 hours) anesthetics used.  Reported result: Relief during the next 4 to 6 hour after the procedure: 80 % (Short-Term Relief) Interpretative annotation: Complete relief would suggest area to be the source of the pain.          Long-term benefit: Defined as the period of time past the expected duration of local anesthetics. With the possible exception of prolonged sympathetic blockade from the local anesthetics, benefits during this period are typically attributed to, or associated with, other factors such as analgesic sensory neuropraxia, antiinflammatory effects, or beneficial biochemical changes provided by agents other than the local anesthetics Reported result: Extended relief following procedure: 95 % (Long-Term Relief) Interpretative annotation: Good relief. This could suggest inflammation to be a significant component in the etiology to the pain.          Current benefits: Defined as persistent relief that continues at this point in time.   Reported results: Treated area: 90 % Regina Campos reports improvement in function Interpretative  annotation: Ongoing benefits would suggest effective therapeutic approach  Interpretation: Results would suggest a successful diagnostic intervention.          Laboratory Chemistry  Inflammation Markers Lab Results  Component Value Date   ESRSEDRATE 11 12/06/2012   (CRP: Acute Phase) (ESR: Chronic Phase)  Note: Lab results reviewed.  Recent Diagnostic Imaging Review  Dg C-arm 1-60 Min-no Report  Result Date: 10/01/2016 Fluoroscopy was utilized by the requesting physician.  No radiographic interpretation.   Note: Imaging results reviewed.          Meds  The patient has a current medication list which includes the following prescription(s): albuterol, amoxicillin, aspirin, budesonide-formoterol, docusate sodium, fenofibrate, levothyroxine, lubiprostone, meloxicam, montelukast, multi-vitamins, rosuvastatin, tiotropium, tizanidine, topiramate, and tramadol.  Current Outpatient Prescriptions on File Prior to Visit  Medication Sig  . albuterol (PROVENTIL HFA;VENTOLIN HFA) 108 (90 Base) MCG/ACT inhaler Inhale into the lungs.  Marland Kitchen aspirin 81 MG tablet Take 81 mg by mouth daily.  . budesonide-formoterol (SYMBICORT) 160-4.5 MCG/ACT inhaler Inhale 2 puffs into the lungs 2 (two) times daily.  Marland Kitchen docusate sodium (COLACE) 100 MG capsule Take 2 capsules (200 mg total) by mouth at bedtime as needed for moderate constipation.  . fenofibrate (TRICOR) 145 MG tablet Take 145 mg by mouth daily.  Marland Kitchen levothyroxine (SYNTHROID, LEVOTHROID) 125 MCG tablet Take 125 mcg by mouth daily before breakfast.  . lubiprostone (AMITIZA) 24 MCG capsule Take 24 mcg by mouth 2 (two) times daily with a meal.  . meloxicam (MOBIC) 15 MG tablet Take 1 tablet (15 mg total) by mouth daily.  . montelukast (SINGULAIR) 10 MG tablet Take by mouth.  . Multiple Vitamin (MULTI-VITAMINS) TABS Take by mouth.  . rosuvastatin (CRESTOR) 10 MG tablet Take 10 mg by mouth daily.  Marland Kitchen tiotropium (SPIRIVA) 18 MCG inhalation capsule Place into  inhaler and inhale.  Marland Kitchen tiZANidine (ZANAFLEX) 4 MG capsule Take 1 capsule (4 mg total) by mouth 3 (three) times daily as needed for muscle spasms.  . traMADol (ULTRAM) 50 MG tablet Take 2 tablets (100 mg total) by mouth every 6 (  six) hours as needed for moderate pain or severe pain.   No current facility-administered medications on file prior to visit.    ROS  Constitutional: Denies any fever or chills Gastrointestinal: No reported hemesis, hematochezia, vomiting, or acute GI distress Musculoskeletal: Denies any acute onset joint swelling, redness, loss of ROM, or weakness Neurological: No reported episodes of acute onset apraxia, aphasia, dysarthria, agnosia, amnesia, paralysis, loss of coordination, or loss of consciousness  Allergies  Regina Campos has No Known Allergies.  Regina Campos  Drug: Regina Campos  reports that she does not use drugs. Alcohol:  reports that she does not drink alcohol. Tobacco:  reports that she has never smoked. She has never used smokeless tobacco. Medical:  has a past medical history of Arthritis; Displacement of lumbar intervertebral disc (06/04/2015); History of cardiac arrhythmia (06/04/2015); History of cervical spinal surgery (06/04/2015); Hyperlipidemia; Hypothyroidism (06/04/2015); and Thyroid disease. Family: family history includes Cancer in her mother; Heart disease in her father.  Past Surgical History:  Procedure Laterality Date  . ABDOMINAL HYSTERECTOMY    . APPENDECTOMY    . BACK SURGERY  07/25/2016  . CARPAL TUNNEL RELEASE    . LUMBAR FUSION    . REPLACEMENT TOTAL KNEE     right and left  . TOTAL SHOULDER REPLACEMENT     Constitutional Exam  General appearance: Well nourished, well developed, and well hydrated. In no apparent acute distress Vitals:   11/05/16 1314  BP: (!) 129/54  Pulse: 65  Resp: 18  Temp: 98 F (36.7 C)  TempSrc: Oral  SpO2: 100%  Weight: 220 lb (99.8 kg)  Height: 5' (1.524 m)   BMI Assessment: Estimated body mass  index is 42.97 kg/m as calculated from the following:   Height as of this encounter: 5' (1.524 m).   Weight as of this encounter: 220 lb (99.8 kg).  BMI interpretation table: BMI level Category Range association with higher incidence of chronic pain  <18 kg/m2 Underweight   18.5-24.9 kg/m2 Ideal body weight   25-29.9 kg/m2 Overweight Increased incidence by 20%  30-34.9 kg/m2 Obese (Class I) Increased incidence by 68%  35-39.9 kg/m2 Severe obesity (Class II) Increased incidence by 136%  >40 kg/m2 Extreme obesity (Class III) Increased incidence by 254%   BMI Readings from Last 4 Encounters:  11/05/16 42.97 kg/m  10/01/16 36.44 kg/m  09/25/16 36.44 kg/m  02/11/16 36.61 kg/m   Wt Readings from Last 4 Encounters:  11/05/16 220 lb (99.8 kg)  10/01/16 219 lb (99.3 kg)  09/25/16 219 lb (99.3 kg)  02/11/16 220 lb (99.8 kg)  Psych/Mental status: Alert, oriented x 3 (person, place, & time)       Eyes: PERLA Respiratory: No evidence of acute respiratory distress  Cervical Spine Exam  Inspection: Well healed scar from previous spine surgery detected Alignment: Symmetrical Functional ROM: Diminished ROM Stability: No instability detected Muscle strength & Tone: Functionally intact Sensory: Movement-associated pain Palpation: Complains of area being tender to palpation  Upper Extremity (UE) Exam    Side: Right upper extremity  Side: Left upper extremity  Inspection: No masses, redness, swelling, or asymmetry. No contractures  Inspection: No masses, redness, swelling, or asymmetry. No contractures  Functional ROM: Unrestricted ROM          Functional ROM: Unrestricted ROM          Muscle strength & Tone: Functionally intact  Muscle strength & Tone: Functionally intact  Sensory: Movement-associated pain  Sensory: Movement-associated discomfort  Palpation: Complains of area being tender  to palpation  Palpation: Complains of area being tender to palpation  Specialized Test(s): Deferred          Specialized Test(s): Deferred          Thoracic Spine Exam  Inspection: No masses, redness, or swelling Alignment: Symmetrical Functional ROM: Unrestricted ROM Stability: No instability detected Sensory: Unimpaired Muscle strength & Tone: Functionally intact Palpation: Non-contributory  Lumbar Spine Exam  Inspection: Well healed scar from previous spine surgery detected Alignment: Symmetrical Functional ROM: Decreased ROM Stability: No instability detected Muscle strength & Tone: Functionally intact Sensory: Movement-associated discomfort Palpation: Non-contributory Provocative Tests: Lumbar Hyperextension and rotation test: evaluation deferred today       Patrick's Maneuver: evaluation deferred today              Gait & Posture Assessment  Ambulation: Unassisted Gait: Relatively normal for age and body habitus Posture: WNL   Lower Extremity Exam    Side: Right lower extremity  Side: Left lower extremity  Inspection: No masses, redness, swelling, or asymmetry. No contractures  Inspection: No masses, redness, swelling, or asymmetry. No contractures  Functional ROM: Unrestricted ROM          Functional ROM: Unrestricted ROM          Muscle strength & Tone: Functionally intact  Muscle strength & Tone: Functionally intact  Sensory: Unimpaired  Sensory: Unimpaired  Palpation: No palpable anomalies  Palpation: No palpable anomalies   Assessment  Primary Diagnosis & Pertinent Problem List: The primary encounter diagnosis was Chronic pain of both shoulders. Diagnoses of Primary osteoarthritis of shoulders, bilateral, History of right shoulder replacement, Arthropathy of shoulder region (Location of Tertiary source of pain) (Left), and Cervical foraminal stenosis were also pertinent to this visit.  Status Diagnosis  Unimproved Unimproved Stable 1. Chronic pain of both shoulders   2. Primary osteoarthritis of shoulders, bilateral   3. History of right shoulder replacement    4. Arthropathy of shoulder region (Location of Tertiary source of pain) (Left)   5. Cervical foraminal stenosis      Plan of Care  Pharmacotherapy (Medications Ordered): No orders of the defined types were placed in this encounter.  New Prescriptions   No medications on file   Medications administered today: Regina Campos had no medications administered during this visit. Lab-work, procedure(s), and/or referral(s): Orders Placed This Encounter  Procedures  . SUPRASCAPULAR NERVE BLOCK   Imaging and/or referral(s): None  Interventional therapies: Planned, scheduled, and/or pending:   Diagnostic bilateral suprascapular NB   Considering:   Diagnostic bilateral suprascapular NB Possible bilateral suprascapular RFA Diagnostic Left intra-articular shoulder joint injection Bilateral intra-articular Hip injection Palliative right-sided L3-4 lumbar epidural steroid injection Palliative right-sided L4-5 transforaminal epidural steroid injection Palliative right-sided L2-3 lumbar epidural steroid injection Palliative right-sided C7-T1 cervical epidural steroid injection Diagnostic bilateral cervical facet block Possible bilateral cervical facet radiofrequency ablation Diagnostic bilateral lumbar facet block Possible bilateral lumbar facet radiofrequency ablation   Palliative PRN treatment(s):   Palliative right-sided L3-4 lumbar epidural steroid injection Palliative right-sided L4-5 transforaminal epidural steroid injection Palliative right-sided L2-3 lumbar epidural steroid injection Palliative right-sided C7-T1 cervical epidural steroid injection Diagnostic bilateral cervical facet block Diagnostic bilateral lumbar facet block Diagnostic bilateral shoulder joint injection Diagnostic bilateral suprascapular nerve block   Provider-requested follow-up: Return for keep scheduled Med-Mgmt appointment, in addition, Procedure.  Future Appointments Date Time Provider [place]  11/17/2016 10:15 AM Milinda Pointer, MD [facility] None  12/17/2016 10:00 AM Crystal Dorrene German, NP [facility] None   Primary  Care Physician: Lenard Simmer, MD Location: [facility] Out[facility] Management Facility Note by: Kathlen Brunswick. Dossie Arbour, M.D, DABA, DABAPM, DABPM, DABIPP, FIPP Date: 11/05/2016; Time: 7:38 PM  Pain Score Disclaimer: We use the NRS-11 scale. This is a self-reported, subjective measurement of pain severity with only modest accuracy. It is used primarily to identify changes within a particular patient. It must be understood that outpatient pain scales are significantly less accurate that those used for research, where they can be applied under ideal controlled circumstances with minimal exposure to variables. In reality, the score is likely to be a combination of pain intensity and pain affect, where pain affect describes the degree of emotional arousal or changes in action readiness caused by the sensory experience of pain. Factors such as social and work situation, setting, emotional state, anxiety levels, expectation, and prior pain experience may influence pain perception and show large inter-individual differences that may also be affected by time variables.  Patient instructions provided during this appointment: Patient Instructions   Keep your next scheduled appointment.Preparing for your procedure (without sedation) Instructions: . Oral Intake: Do not eat or drink anything for at least 3 hours prior to your procedure. . Transportation: Unless otherwise stated by your physician, you may drive yourself after the procedure. . Blood Pressure Medicine: Take your blood pressure medicine with a sip of water the morning of the procedure. . Blood thinners:  . Diabetics on insulin: Notify the staff so that you can be scheduled 1st case in the morning. If your diabetes requires high dose insulin, take only  of your normal insulin dose the morning of the procedure and notify the  staff that you have done so. . Preventing infections: Shower with an antibacterial soap the morning of your procedure.  . Build-up your immune system: Take 1000 mg of Vitamin C with every meal (3 times a day) the day prior to your procedure. Marland Kitchen Antibiotics: Inform the staff if you have a condition or reason that requires you to take antibiotics before dental procedures. . Pregnancy: If you are pregnant, call and cancel the procedure. . Sickness: If you have a cold, fever, or any active infections, call and cancel the procedure. . Arrival: You must be in the facility at least 30 minutes prior to your scheduled procedure. . Children: Do not bring any children with you. . Dress appropriately: Bring dark clothing that you would not mind if they get stained. . Valuables: Do not bring any jewelry or valuables. Procedure appointments are reserved for interventional treatments only. Marland Kitchen No Prescription Refills. . No medication changes will be discussed during procedure appointments. . No disability issues will be discussed.  ____________________________________________________________________________________________  Pain Score  Introduction: The pain score used by this practice is the Verbal Numerical Rating Scale (VNRS-11). This is an 11-point scale. It is for adults and children 10 years or older. There are significant differences in how the pain score is reported, used, and applied. Forget everything you learned in the past and learn this scoring system.  General Information: The scale should reflect your current level of pain. Unless you are specifically asked for the level of your worst pain, or your average pain. If you are asked for one of these two, then it should be understood that it is over the past 24 hours.  Basic Activities of Daily Living (ADL): Personal hygiene, dressing, eating, transferring, and using restroom.  Instructions: Most patients tend to report their level of pain as a  combination of two factors,  their physical pain and their psychosocial pain. This last one is also known as "suffering" and it is reflection of how physical pain affects you socially and psychologically. From now on, report them separately. From this point on, when asked to report your pain level, report only your physical pain. Use the following table for reference.  Pain Clinic Pain Levels (0-5/10)  Pain Level Score Description  No Pain 0   Mild pain 1 Nagging, annoying, but does not interfere with basic activities of daily living (ADL). Patients are able to eat, bathe, get dressed, toileting (being able to get on and off the toilet and perform personal hygiene functions), transfer (move in and out of bed or a chair without assistance), and maintain continence (able to control bladder and bowel functions). Blood pressure and heart rate are unaffected. A normal heart rate for a healthy adult ranges from 60 to 100 bpm (beats per minute).   Mild to moderate pain 2 Noticeable and distracting. Impossible to hide from other people. More frequent flare-ups. Still possible to adapt and function close to normal. It can be very annoying and may have occasional stronger flare-ups. With discipline, patients may get used to it and adapt.   Moderate pain 3 Interferes significantly with activities of daily living (ADL). It becomes difficult to feed, bathe, get dressed, get on and off the toilet or to perform personal hygiene functions. Difficult to get in and out of bed or a chair without assistance. Very distracting. With effort, it can be ignored when deeply involved in activities.   Moderately severe pain 4 Impossible to ignore for more than a few minutes. With effort, patients may still be able to manage work or participate in some social activities. Very difficult to concentrate. Signs of autonomic nervous system discharge are evident: dilated pupils (mydriasis); mild sweating (diaphoresis); sleep interference.  Heart rate becomes elevated (>115 bpm). Diastolic blood pressure (lower number) rises above 100 mmHg. Patients find relief in laying down and not moving.   Severe pain 5 Intense and extremely unpleasant. Associated with frowning face and frequent crying. Pain overwhelms the senses.  Ability to do any activity or maintain social relationships becomes significantly limited. Conversation becomes difficult. Pacing back and forth is common, as getting into a comfortable position is nearly impossible. Pain wakes you up from deep sleep. Physical signs will be obvious: pupillary dilation; increased sweating; goosebumps; brisk reflexes; cold, clammy hands and feet; nausea, vomiting or dry heaves; loss of appetite; significant sleep disturbance with inability to fall asleep or to remain asleep. When persistent, significant weight loss is observed due to the complete loss of appetite and sleep deprivation.  Blood pressure and heart rate becomes significantly elevated. Caution: If elevated blood pressure triggers a pounding headache associated with blurred vision, then the patient should immediately seek attention at an urgent or emergency care unit, as these may be signs of an impending stroke.    Emergency Department Pain Levels (6-10/10)  Emergency Room Pain 6 Severely limiting. Requires emergency care and should not be seen or managed at an outpatient pain management facility. Communication becomes difficult and requires great effort. Assistance to reach the emergency department may be required. Facial flushing and profuse sweating along with potentially dangerous increases in heart rate and blood pressure will be evident.   Distressing pain 7 Self-care is very difficult. Assistance is required to transport, or use restroom. Assistance to reach the emergency department will be required. Tasks requiring coordination, such as bathing and getting dressed  become very difficult.   Disabling pain 8 Self-care is no  longer possible. At this level, pain is disabling. The individual is unable to do even the most "basic" activities such as walking, eating, bathing, dressing, transferring to a bed, or toileting. Fine motor skills are lost. It is difficult to think clearly.   Incapacitating pain 9 Pain becomes incapacitating. Thought processing is no longer possible. Difficult to remember your own name. Control of movement and coordination are lost.   The worst pain imaginable 10 At this level, most patients pass out from pain. When this level is reached, collapse of the autonomic nervous system occurs, leading to a sudden drop in blood pressure and heart rate. This in turn results in a temporary and dramatic drop in blood flow to the brain, leading to a loss of consciousness. Fainting is one of the body's self defense mechanisms. Passing out puts the brain in a calmed state and causes it to shut down for a while, in order to begin the healing process.    Summary: 1. Refer to this scale when providing [place] with your pain level. 2. Be accurate and careful when reporting your pain level. This will help with your care. 3. Over-reporting your pain level will lead to loss of credibility. 4. Even a level of 1/10 means that there is pain and will be treated at our facility. 5. High, inaccurate reporting will be documented as "Symptom Exaggeration", leading to loss of credibility and suspicions of possible secondary gains such as obtaining more narcotics, or wanting to appear disabled, for fraudulent reasons. 6. Only pain levels of 5 or below will be seen at our facility. 7. Pain levels of 6 and above will be sent to the Emergency Department and the appointment cancelled.  Do not eat or drink 3 hours before your appointment. _____________________________________________________________________________________________

## 2016-11-17 ENCOUNTER — Ambulatory Visit: Admitting: Pain Medicine

## 2016-11-19 ENCOUNTER — Ambulatory Visit (HOSPITAL_BASED_OUTPATIENT_CLINIC_OR_DEPARTMENT_OTHER): Payer: Medicare Other | Admitting: Pain Medicine

## 2016-11-19 ENCOUNTER — Ambulatory Visit
Admission: RE | Admit: 2016-11-19 | Discharge: 2016-11-19 | Disposition: A | Discharge status: Home / Self Care | Payer: Medicare Other | Source: Ambulatory Visit | Attending: Pain Medicine | Admitting: Pain Medicine

## 2016-11-19 ENCOUNTER — Encounter: Payer: Self-pay | Admitting: Pain Medicine

## 2016-11-19 VITALS — BP 113/71 | HR 66 | Temp 98.0°F | Resp 16 | Ht 65.0 in | Wt 220.0 lb

## 2016-11-19 DIAGNOSIS — M25512 Pain in left shoulder: Secondary | ICD-10-CM | POA: Diagnosis present

## 2016-11-19 DIAGNOSIS — G8929 Other chronic pain: Secondary | ICD-10-CM | POA: Insufficient documentation

## 2016-11-19 DIAGNOSIS — M25511 Pain in right shoulder: Secondary | ICD-10-CM | POA: Diagnosis present

## 2016-11-19 DIAGNOSIS — Z96611 Presence of right artificial shoulder joint: Secondary | ICD-10-CM | POA: Diagnosis not present

## 2016-11-19 DIAGNOSIS — M19011 Primary osteoarthritis, right shoulder: Secondary | ICD-10-CM | POA: Insufficient documentation

## 2016-11-19 DIAGNOSIS — M19012 Primary osteoarthritis, left shoulder: Secondary | ICD-10-CM | POA: Insufficient documentation

## 2016-11-19 MED ORDER — LIDOCAINE HCL (PF) 1 % IJ SOLN
10.0000 mL | Freq: Once | INTRAMUSCULAR | Status: AC
  Administered 2016-11-19: 5 mL
  Filled 2016-11-19: qty 10

## 2016-11-19 MED ORDER — ROPIVACAINE HCL 2 MG/ML IJ SOLN
9.0000 mL | Freq: Once | INTRAMUSCULAR | Status: AC
  Administered 2016-11-19: 9 mL
  Filled 2016-11-19: qty 10

## 2016-11-19 MED ORDER — ROPIVACAINE HCL 2 MG/ML IJ SOLN
INTRAMUSCULAR | Status: AC
  Filled 2016-11-19: qty 10

## 2016-11-19 MED ORDER — METHYLPREDNISOLONE ACETATE 80 MG/ML IJ SUSP
80.0000 mg | Freq: Once | INTRAMUSCULAR | Status: AC
  Administered 2016-11-19: 80 mg
  Filled 2016-11-19: qty 1

## 2016-11-19 NOTE — Progress Notes (Signed)
Safety precautions to be maintained throughout the outpatient stay will include: orient to surroundings, keep bed in low position, maintain call bell within reach at all times, provide assistance with transfer out of bed and ambulation.  

## 2016-11-19 NOTE — Progress Notes (Signed)
Patient's Name: Regina Campos  MRN: [id number]  Referring Provider: Milinda Pointer, MD  DOB: 24-Apr-1951  PCP: Lenard Simmer, MD  DOS: 11/19/2016  Note by: Kathlen Brunswick. Dossie Arbour, MD  Service setting: Ambulatory outpatient  Location: [facility] (AMB) Pain Management Facility  Visit type: Procedure  Specialty: Interventional Pain Management  Patient type: Established   Primary Reason for Visit: Interventional Pain Management Treatment. CC: Shoulder Pain (bilateral)  Procedure:  Anesthesia, Analgesia, Anxiolysis:  Type: Diagnostic Suprascapular nerve Block #1 Region: Posterior Shoulder & Scapular Areas Level: Superior to the scapular spine, in the lateral aspect of the supraspinatus fossa (Suprescapular notch). Laterality: Bilateral  Type: Local Anesthesia Local Anesthetic: Lidocaine 1% Route: Infiltration (/IM) IV Access: Declined Sedation: Declined  Indication(s): Analgesia          Indications: 1. Primary osteoarthritis of shoulders, bilateral   2. Chronic pain of both shoulders   3. S/P shoulder replacement (Right)    Pain Score: Pre-procedure: 4 /10 Post-procedure: 0-No pain/10  Pre-op Assessment:  Previous date of service: 11/05/16 Service provided: Evaluation Regina Campos is a 66 y.o. (year old), female patient, seen today for interventional treatment. She  has a past surgical history that includes Abdominal hysterectomy; Total shoulder replacement; Replacement total knee; Appendectomy; Lumbar fusion; Carpal tunnel release; and Back surgery (07/25/2016). Her primarily concern today is the Shoulder Pain (bilateral)  Initial Vital Signs: Blood pressure 127/80, pulse 65, temperature 98 F (36.7 C), temperature source Oral, resp. rate 16, height 5\' 5"  (1.651 m), weight 220 lb (99.8 kg), SpO2 97 %. BMI: 36.61 kg/m  Risk Assessment: Allergies: Reviewed. She has No Known Allergies.  Allergy Precautions: None required Coagulopathies: "Reviewed. None identified.  Blood-thinner  therapy: None at this time Active Infection(s): Reviewed. None identified. Regina Campos is afebrile  Site Confirmation: Regina Campos was asked to confirm the procedure and laterality before marking the site Procedure checklist: Completed Consent: Before the procedure and under the influence of no sedative(s), amnesic(s), or anxiolytics, the patient was informed of the treatment options, risks and possible complications. To fulfill our ethical and legal obligations, as recommended by the American Medical Association's Code of Ethics, I have informed the patient of my clinical impression; the nature and purpose of the treatment or procedure; the risks, benefits, and possible complications of the intervention; the alternatives, including doing nothing; the risk(s) and benefit(s) of the alternative treatment(s) or procedure(s); and the risk(s) and benefit(s) of doing nothing. The patient was provided information about the general risks and possible complications associated with the procedure. These may include, but are not limited to: failure to achieve desired goals, infection, bleeding, organ or nerve damage, allergic reactions, paralysis, and death. In addition, the patient was informed of those risks and complications associated to the procedure, such as failure to decrease pain; infection; bleeding; organ or nerve damage with subsequent damage to sensory, motor, and/or autonomic systems, resulting in permanent pain, numbness, and/or weakness of one or several areas of the body; allergic reactions; (i.e.: anaphylactic reaction); and/or death. Furthermore, the patient was informed of those risks and complications associated with the medications. These include, but are not limited to: allergic reactions (i.e.: anaphylactic or anaphylactoid reaction(s)); adrenal axis suppression; blood sugar elevation that in diabetics may result in ketoacidosis or comma; water retention that in patients with history of  congestive heart failure may result in shortness of breath, pulmonary edema, and decompensation with resultant heart failure; weight gain; swelling or edema; medication-induced neural toxicity; particulate matter embolism and blood vessel occlusion  with resultant organ, and/or nervous system infarction; and/or aseptic necrosis of one or more joints. Finally, the patient was informed that Medicine is not an exact science; therefore, there is also the possibility of unforeseen or unpredictable risks and/or possible complications that may result in a catastrophic outcome. The patient indicated having understood very clearly. We have given the patient no guarantees and we have made no promises. Enough time was given to the patient to ask questions, all of which were answered to the patient's satisfaction. Regina Campos has indicated that she wanted to continue with the procedure. Attestation: I, the ordering provider, attest that I have discussed with the patient the benefits, risks, side-effects, alternatives, likelihood of achieving goals, and potential problems during recovery for the procedure that I have provided informed consent. Date: 11/19/2016; Time: 8:36 AM  Pre-Procedure Preparation:  Monitoring: As per clinic protocol. Respiration, ETCO2, SpO2, BP, heart rate and rhythm monitor placed and checked for adequate function Safety Precautions: Patient was assessed for positional comfort and pressure points before starting the procedure. Time-out: I initiated and conducted the "Time-out" before starting the procedure, as per protocol. The patient was asked to participate by confirming the accuracy of the "Time Out" information. Verification of the correct person, site, and procedure were performed and confirmed by me, the nursing staff, and the patient. "Time-out" conducted as per Joint Commission's Universal Protocol (UP.01.01.01). "Time-out" Date & Time: 11/19/2016; 0925 hrs.  Description of Procedure  Process:   Position: Prone Target Area: Suprascapular notch. Approach: Posterior approach. Area Prepped: Entire shoulder Area Prepping solution: ChloraPrep (2% chlorhexidine gluconate and 70% isopropyl alcohol) Safety Precautions: Aspiration looking for blood return was conducted prior to all injections. At no point did we inject any substances, as a needle was being advanced. No attempts were made at seeking any paresthesias. Safe injection practices and needle disposal techniques used. Medications properly checked for expiration dates. SDV (single dose vial) medications used. Description of the Procedure: Protocol guidelines were followed. The patient was placed in position over the procedure table. The target area was identified and the area prepped in the usual manner. Skin & deeper tissues infiltrated with local anesthetic. Appropriate amount of time allowed to pass for local anesthetics to take effect. The procedure needles were then advanced to the target area. Proper needle placement secured. Negative aspiration confirmed. Solution injected in intermittent fashion, asking for systemic symptoms every 0.5cc of injectate. The needles were then removed and the area cleansed, making sure to leave some of the prepping solution back to take advantage of its long term bactericidal properties. Vitals:   11/19/16 0920 11/19/16 0930 11/19/16 0940 11/19/16 0945  BP: 126/82 123/77 122/89 113/71  Pulse: 71 66 67 66  Resp: 14 12 14 16   Temp:      TempSrc:      SpO2: 96% 100% 99% 99%  Weight:      Height:        Start Time: 0925 hrs. End Time: 0939 hrs. Materials:  Needle(s) Type: Regular needle Gauge: 22G Length: 3.5-in Medication(s): We administered methylPREDNISolone acetate, ropivacaine (PF) 2 mg/mL (0.2%), and lidocaine (PF). Please see chart orders for dosing details.  Imaging Guidance (Non-Spinal):  Type of Imaging Technique: Fluoroscopy Guidance (Non-Spinal) Indication(s): Assistance in  needle guidance and placement for procedures requiring needle placement in or near specific anatomical locations not easily accessible without such assistance. Exposure Time: Please see nurses notes. Contrast: Before injecting any contrast, we confirmed that the patient did not have an allergy to  iodine, shellfish, or radiological contrast. Once satisfactory needle placement was completed at the desired level, radiological contrast was injected. Contrast injected under live fluoroscopy. No contrast complications. See chart for type and volume of contrast used. Fluoroscopic Guidance: I was personally present during the use of fluoroscopy. "Tunnel Vision Technique" used to obtain the best possible view of the target area. Parallax error corrected before commencing the procedure. "Direction-depth-direction" technique used to introduce the needle under continuous pulsed fluoroscopy. Once target was reached, antero-posterior, oblique, and lateral fluoroscopic projection used confirm needle placement in all planes. Images permanently stored in EMR. Interpretation: I personally interpreted the imaging intraoperatively. Adequate needle placement confirmed in multiple planes. Appropriate spread of contrast into desired area was observed. No evidence of afferent or efferent intravascular uptake. Permanent images saved into the patient's record.  Antibiotic Prophylaxis:  Indication(s): None identified Antibiotic given: None  Post-operative Assessment:  EBL: None Complications: No immediate post-treatment complications observed by team, or reported by patient. Note: The patient tolerated the entire procedure well. A repeat set of vitals were taken after the procedure and the patient was kept under observation following institutional policy, for this type of procedure. Post-procedural neurological assessment was performed, showing return to baseline, prior to discharge. The patient was provided with post-procedure  discharge instructions, including a section on how to identify potential problems. Should any problems arise concerning this procedure, the patient was given instructions to immediately contact us, at any time, without hesitation. In any case, we plan to contact the patient by telephone for a follow-up status report regarding this interventional procedure. Comments:  No additional relevant information.  Plan of Care  Disposition: Discharge home  Discharge Date & Time: 11/19/2016; 0946 hrs.  Physician-requested Follow-up:  Return in about 2 weeks (around 12/03/2016) for Post-Procedure evaluation.  Future Appointments Date Time Provider [place]  12/17/2016 10:00 AM Crystal Dorrene German, NP [facility] None   Medications ordered for procedure: Meds ordered this encounter  Medications  . methylPREDNISolone acetate (DEPO-MEDROL) injection 80 mg  . ropivacaine (PF) 2 mg/mL (0.2%) (NAROPIN) injection 9 mL  . lidocaine (PF) (XYLOCAINE) 1 % injection 10 mL   Medications administered: We administered methylPREDNISolone acetate, ropivacaine (PF) 2 mg/mL (0.2%), and lidocaine (PF).  See the medical record for exact dosing, route, and time of administration.  Lab-work, Procedure(s), & Referral(s) Ordered: Orders Placed This Encounter  Procedures  . DG C-Arm 1-60 Min-No Report  . Discharge instructions  . Follow-up  . Informed Consent Details: Transcribe to consent form and obtain patient signature  . Provider attestation of informed consent for procedure/surgical case  . Verify informed consent   Imaging Ordered: Results for orders placed in visit on 10/01/16  DG C-Arm 1-60 Min-No Report   Narrative Fluoroscopy was utilized by the requesting physician.  No radiographic  interpretation.    New Prescriptions   No medications on file   Primary Care Physician: Lenard Simmer, MD Location: [facility] Note by: Kathlen Brunswick. Dossie Arbour, M.D, DABA, DABAPM, DABPM,  DABIPP, FIPP Date: 11/19/2016; Time: 4:22 PM  Disclaimer:  Medicine is not an Chief Strategy Officer. The only guarantee in medicine is that nothing is guaranteed. It is important to note that the decision to proceed with this intervention was based on the information collected from the patient. The Data and conclusions were drawn from the patient's questionnaire, the interview, and the physical examination. Because the information was provided in large part by the patient, it cannot be guaranteed that it has  not been purposely or unconsciously manipulated. Every effort has been made to obtain as much relevant data as possible for this evaluation. It is important to note that the conclusions that lead to this procedure are derived in large part from the available data. Always take into account that the treatment will also be dependent on availability of resources and existing treatment guidelines, considered by other Pain Management Practitioners as being common knowledge and practice, at the time of the intervention. For Medico-Legal purposes, it is also important to point out that variation in procedural techniques and pharmacological choices are the acceptable norm. The indications, contraindications, technique, and results of the above procedure should only be interpreted and judged by a Board-Certified Interventional Pain Specialist with extensive familiarity and expertise in the same exact procedure and technique. Attempts at providing opinions without similar or greater experience and expertise than that of the treating physician will be considered as inappropriate and unethical, and shall result in a formal complaint to the state medical board and applicable specialty societies.  Instructions provided at this appointment: Patient Instructions  Please complete and return the Post Procedure Diary at your next visit. Pain Management Discharge Instructions  General Discharge Instructions :  If you need to  reach your doctor call: Monday-Friday 8:00 am - 4:00 pm at [phone] or toll free [phone].  After clinic hours [phone] to have operator reach doctor.  Bring all of your medication bottles to all your appointments in the pain clinic.  To cancel or reschedule your appointment with Pain Management please remember to call 24 hours in advance to avoid a fee.  Refer to the educational materials which you have been given on: General Risks, I had my Procedure. Discharge Instructions, Post Sedation.  Post Procedure Instructions:  The drugs you were given will stay in your system until tomorrow, so for the next 24 hours you should not drive, make any legal decisions or drink any alcoholic beverages.  You may eat anything you prefer, but it is better to start with liquids then soups and crackers, and gradually work up to solid foods.  Please notify your doctor immediately if you have any unusual bleeding, trouble breathing or pain that is not related to your normal pain.  Depending on the type of procedure that was done, some parts of your body may feel week and/or numb.  This usually clears up by tonight or the next day.  Walk with the use of an assistive device or accompanied by an adult for the 24 hours.  You may use ice on the affected area for the first 24 hours.  Put ice in a Ziploc bag and cover with a towel and place against area 15 minutes on 15 minutes off.  You may switch to heat after 24 hours.

## 2016-11-19 NOTE — Patient Instructions (Signed)
Please complete and return the Post Procedure Diary at your next visit. Pain Management Discharge Instructions  General Discharge Instructions :  If you need to reach your doctor call: Monday-Friday 8:00 am - 4:00 pm at [phone] or toll free [phone].  After clinic hours [phone] to have operator reach doctor.  Bring all of your medication bottles to all your appointments in the pain clinic.  To cancel or reschedule your appointment with Pain Management please remember to call 24 hours in advance to avoid a fee.  Refer to the educational materials which you have been given on: General Risks, I had my Procedure. Discharge Instructions, Post Sedation.  Post Procedure Instructions:  The drugs you were given will stay in your system until tomorrow, so for the next 24 hours you should not drive, make any legal decisions or drink any alcoholic beverages.  You may eat anything you prefer, but it is better to start with liquids then soups and crackers, and gradually work up to solid foods.  Please notify your doctor immediately if you have any unusual bleeding, trouble breathing or pain that is not related to your normal pain.  Depending on the type of procedure that was done, some parts of your body may feel week and/or numb.  This usually clears up by tonight or the next day.  Walk with the use of an assistive device or accompanied by an adult for the 24 hours.  You may use ice on the affected area for the first 24 hours.  Put ice in a Ziploc bag and cover with a towel and place against area 15 minutes on 15 minutes off.  You may switch to heat after 24 hours.

## 2016-11-20 ENCOUNTER — Telehealth: Payer: Self-pay | Admitting: *Deleted

## 2016-11-20 NOTE — Telephone Encounter (Signed)
Attempted to call patient for post procedure follow-up, message left.

## 2016-12-17 ENCOUNTER — Ambulatory Visit: Payer: Medicare Other | Attending: Nurse Practitioner | Admitting: Nurse Practitioner

## 2016-12-17 ENCOUNTER — Encounter: Payer: Self-pay | Admitting: Nurse Practitioner

## 2016-12-17 VITALS — BP 135/82 | HR 66 | Temp 97.6°F | Resp 16 | Ht 65.0 in | Wt 220.0 lb

## 2016-12-17 DIAGNOSIS — Z79899 Other long term (current) drug therapy: Secondary | ICD-10-CM | POA: Diagnosis not present

## 2016-12-17 DIAGNOSIS — M5416 Radiculopathy, lumbar region: Secondary | ICD-10-CM | POA: Insufficient documentation

## 2016-12-17 DIAGNOSIS — Z7982 Long term (current) use of aspirin: Secondary | ICD-10-CM | POA: Diagnosis not present

## 2016-12-17 DIAGNOSIS — M961 Postlaminectomy syndrome, not elsewhere classified: Secondary | ICD-10-CM

## 2016-12-17 DIAGNOSIS — M1610 Unilateral primary osteoarthritis, unspecified hip: Secondary | ICD-10-CM | POA: Diagnosis not present

## 2016-12-17 DIAGNOSIS — K5900 Constipation, unspecified: Secondary | ICD-10-CM | POA: Diagnosis not present

## 2016-12-17 DIAGNOSIS — M542 Cervicalgia: Secondary | ICD-10-CM | POA: Insufficient documentation

## 2016-12-17 DIAGNOSIS — E7800 Pure hypercholesterolemia, unspecified: Secondary | ICD-10-CM | POA: Diagnosis not present

## 2016-12-17 DIAGNOSIS — R51 Headache: Secondary | ICD-10-CM | POA: Insufficient documentation

## 2016-12-17 DIAGNOSIS — M4802 Spinal stenosis, cervical region: Secondary | ICD-10-CM | POA: Diagnosis not present

## 2016-12-17 DIAGNOSIS — M25511 Pain in right shoulder: Secondary | ICD-10-CM | POA: Diagnosis not present

## 2016-12-17 DIAGNOSIS — E039 Hypothyroidism, unspecified: Secondary | ICD-10-CM | POA: Insufficient documentation

## 2016-12-17 DIAGNOSIS — Z96611 Presence of right artificial shoulder joint: Secondary | ICD-10-CM | POA: Diagnosis not present

## 2016-12-17 DIAGNOSIS — G894 Chronic pain syndrome: Secondary | ICD-10-CM | POA: Insufficient documentation

## 2016-12-17 DIAGNOSIS — Z90710 Acquired absence of both cervix and uterus: Secondary | ICD-10-CM | POA: Diagnosis not present

## 2016-12-17 DIAGNOSIS — M25512 Pain in left shoulder: Secondary | ICD-10-CM | POA: Diagnosis not present

## 2016-12-17 DIAGNOSIS — M9981 Other biomechanical lesions of cervical region: Secondary | ICD-10-CM | POA: Diagnosis not present

## 2016-12-17 DIAGNOSIS — Z9889 Other specified postprocedural states: Secondary | ICD-10-CM | POA: Insufficient documentation

## 2016-12-17 DIAGNOSIS — M797 Fibromyalgia: Secondary | ICD-10-CM | POA: Insufficient documentation

## 2016-12-17 DIAGNOSIS — F329 Major depressive disorder, single episode, unspecified: Secondary | ICD-10-CM | POA: Insufficient documentation

## 2016-12-17 DIAGNOSIS — E669 Obesity, unspecified: Secondary | ICD-10-CM | POA: Insufficient documentation

## 2016-12-17 DIAGNOSIS — J449 Chronic obstructive pulmonary disease, unspecified: Secondary | ICD-10-CM | POA: Insufficient documentation

## 2016-12-17 DIAGNOSIS — M48061 Spinal stenosis, lumbar region without neurogenic claudication: Secondary | ICD-10-CM | POA: Insufficient documentation

## 2016-12-17 MED ORDER — NALOXONE HCL 0.4 MG/ML IJ SOLN: 0.4000 mg | INTRAMUSCULAR | 0 refills | Status: DC | PRN

## 2016-12-17 NOTE — Progress Notes (Signed)
Patient's Name: Regina Campos  MRN: [id number]  Referring Provider: Lenard Simmer, MD  DOB: 1951/02/03  PCP: Lenard Simmer, MD  DOS: 12/17/2016  Note by: Vevelyn Francois NP  Service setting: Ambulatory outpatient  Specialty: Interventional Pain Management  Location: [facility] (AMB) Pain Management Facility    Patient type: Established    Primary Reason(s) for Visit: Encounter for prescription drug management (Level of risk: moderate) CC: Shoulder Pain (bilateral); Headache; and Joint Pain (arthritis)  HPI  [name] is a 66 y.o. year old, female patient, who comes today for a medication management evaluation. She has Encounter for therapeutic drug level monitoring; Long term current use of opiate analgesic; Uncomplicated opioid dependence ([place]); Opiate use (40 MME/Day); Chronic neck pain (Location of Primary Source of Pain); Chronic low back pain (Location of Secondary source of pain); Lumbar radicular pain; Lumbar spondylosis with radicular symptoms; Chronic pain syndrome; Pulmonary emphysema ([place]); Atypical migraine; Apnea, sleep; High cholesterol; Hypothyroidism; History of cervical spinal surgery; Cervical foraminal stenosis; Cervicogenic headache; Fibromyalgia; History of cardiac arrhythmia; Depression; Obesity; 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); Cervical central spinal stenosis; Long term prescription opiate use; Myofascial pain; Cervical paraspinal muscle spasm; Cervical spondylosis with radiculopathy (Right side); 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 Her primarily concern today is the Shoulder Pain (bilateral); Headache; and Joint Pain (arthritis)  Pain Assessment: Self-Reported Pain Score: 5  (shoulder pain is completely better)/10 Clinically the patient looks like a 2/10 Reported level is compatible with observation. Information on the proper use of the pain scale provided to the patient today Pain Type: Chronic pain Pain Location: Shoulder (headache, joint pain) Pain Orientation: Left, Right Pain Descriptors / Indicators: Throbbing, Constant, Nagging Pain Frequency: Constant  [name] was last scheduled for an appointment on 10/27/16 for medication management. During today's appointment we reviewed [name]'s chronic pain status, as well as her outpatient medication regimen. She has chronic neck and back pain. She is SP bilateral shoulder intervention. She admits that the inventions has been effective and she continues to have relief. She failed to bring in her evaluation form. She does have neck pain today. She is also having a slight headache. She admits this is secondary to decreased activty. She is sleeping a lot. She is having increased depression secondary to pain. She is not able to do the things that she needs to do.  She has been referred to a therapist however she has not started on any treatment at this time. She will follow up next week.  She is She is SP lumbar spinal surgery by Dr Lacinda Axon in Dec. She admits that the pain has improved in her legs has improved. She admits that she does not need a refill on her medication to day.   The patient  reports that she does not use drugs. Her body mass index is 36.61 kg/m.  Further details on both, my assessment(s), as well as the proposed treatment plan, please see below.  Controlled Substance  Pharmacotherapy Assessment REMS (Risk Evaluation and Mitigation Strategy)  Analgesic: Tramadol  50 mg 2 tablets every 6 hours (400 mg/day) MME/day: 40 mg/day Janett Billow, RN  12/17/2016 10:21 AM  Sign at close encounter Safety precautions to be maintained throughout the outpatient stay will include: orient to surroundings, keep bed in low position, maintain call bell within reach at all times, provide assistance with transfer out of bed and ambulation.    Pharmacokinetics: Liberation and absorption (onset of action): WNL Distribution (time to peak effect): WNL Metabolism and excretion (duration of action): WNL         Pharmacodynamics: Desired effects: Analgesia: Ms. Greenlaw reports >50% benefit. Functional ability: Patient reports that medication allows her to accomplish basic ADLs Clinically meaningful improvement in function (CMIF): Sustained CMIF goals met Perceived effectiveness: Described as relatively effective, allowing for increase in activities of daily living (ADL) Undesirable effects: Side-effects or Adverse reactions: None reported Monitoring:  PMP: Online review of the past 53-monthperiod conducted. Compliant with practice rules and regulations List of all UDS test(s) done:  Lab Results  Component Value Date   TOXASSSELUR FINAL 09/18/2015   Last UDS on record: ToxAssure Select 13  Date Value Ref Range Status  09/18/2015 FINAL  Final    Comment:    ==================================================================== TOXASSURE SELECT 13 (MW) ==================================================================== Test                             Result       Flag       Units Drug Present and Declared for Prescription Verification   Tramadol                       PRESENT      EXPECTED   O-Desmethyltramadol            PRESENT      EXPECTED   N-Desmethyltramadol            PRESENT      EXPECTED    Source of tramadol is a prescription medication.     O-desmethyltramadol and N-desmethyltramadol are expected    metabolites of tramadol. ==================================================================== Test                      Result    Flag   Units      Ref Range   Creatinine              89               mg/dL      >=20 ==================================================================== Declared Medications:  The flagging and interpretation on this report are based on the  following declared medications.  Unexpected results may arise from  inaccuracies in the declared medications.  **Note: The testing scope of this panel includes these medications:  Tramadol ==================================================================== For clinical consultation, please call [phone] ====================================================================    UDS interpretation: Compliant          Medication Assessment Form: Reviewed. Patient indicates being compliant with therapy Treatment compliance: Compliant Risk Assessment Profile: Aberrant behavior: See prior evaluations. None observed or detected today Comorbid factors increasing risk of overdose: See prior notes. No additional risks detected today Risk of substance use disorder (SUD): Low Opioid Risk Tool (ORT) Total Score: 4  Interpretation Table:  Score <3 = Low Risk for SUD  Score between 4-7 = Moderate Risk for SUD  Score >8 = High Risk for Opioid Abuse   Risk Mitigation Strategies:  Patient Counseling: Covered  Patient-Prescriber Agreement (PPA): Present and active  Notification to other healthcare providers: Done  Pharmacologic Plan: No change in therapy, at this time  Laboratory Chemistry  Inflammation Markers Lab Results  Component Value Date   ESRSEDRATE 11 12/06/2012   (CRP: Acute Phase) (ESR: Chronic Phase) Renal Function Markers No results found for: BUN, CREATININE, GFRAA, GFRNONAA Hepatic Function Markers No results found for: AST, ALT, ALBUMIN,  ALKPHOS, HCVAB Electrolytes No results found for: NA, K, CL, CALCIUM, MG Neuropathy Markers No results found for: [id number] Bone Pathology Markers No results found for: Hendricks Milo, VD125OH2TOT, TD4287GO1, [id number], 25OHVITD1, 25OHVITD2, 25OHVITD3, CALCIUM, TESTOFREE, TESTOSTERONE Coagulation Parameters No results found for: INR, LABPROT, APTT, PLT Cardiovascular Markers No results found for: BNP, HGB, HCT Note: Lab results reviewed.  Recent Diagnostic Imaging Review  Dg C-arm 1-60 Min-no Report  Result Date: 11/19/2016 Fluoroscopy was utilized by the requesting physician.  No radiographic interpretation.   Note: Imaging results reviewed.          Meds  The patient has a current medication list which includes the following prescription(s): albuterol, aspirin, budesonide-formoterol, docusate sodium, fenofibrate, levothyroxine, lubiprostone, meloxicam, montelukast, multi-vitamins, rosuvastatin, tiotropium, tizanidine, topiramate, tramadol, and naloxone.  Current Outpatient Prescriptions on File Prior to Visit  Medication Sig  . albuterol (PROVENTIL HFA;VENTOLIN HFA) 108 (90 Base) MCG/ACT inhaler Inhale into the lungs.  Marland Kitchen aspirin 81 MG tablet Take 81 mg by mouth daily.  . budesonide-formoterol (SYMBICORT) 160-4.5 MCG/ACT inhaler Inhale 2 puffs into the lungs 2 (two) times daily.  Marland Kitchen docusate sodium (COLACE) 100 MG capsule Take 2 capsules (200 mg total) by mouth at bedtime as needed for moderate constipation.  . fenofibrate (TRICOR) 145 MG tablet Take 145 mg by mouth daily.  Marland Kitchen levothyroxine (SYNTHROID, LEVOTHROID) 125 MCG tablet Take 125 mcg by mouth daily before breakfast.  . lubiprostone (AMITIZA) 24 MCG capsule Take 24 mcg by mouth 2 (two) times daily with a meal.  . meloxicam (MOBIC) 15 MG tablet Take 1 tablet (15 mg total) by mouth daily.  . montelukast (SINGULAIR) 10 MG tablet Take by mouth.  . Multiple Vitamin (MULTI-VITAMINS) TABS Take by mouth.  . rosuvastatin (CRESTOR) 10  MG tablet Take 10 mg by mouth daily.  Marland Kitchen tiotropium (SPIRIVA) 18 MCG inhalation capsule Place into inhaler and inhale.  Marland Kitchen tiZANidine (ZANAFLEX) 4 MG capsule Take 1 capsule (4 mg total) by mouth 3 (three) times daily as needed for muscle spasms.  Marland Kitchen topiramate (TOPAMAX) 50 MG tablet Take 50 mg by mouth daily.  . traMADol (ULTRAM) 50 MG tablet Take 2 tablets (100 mg total) by mouth every 6 (six) hours as needed for moderate pain or severe pain.   No current facility-administered medications on file prior to visit.    ROS  Constitutional: Denies any fever or chills Gastrointestinal: No reported hemesis, hematochezia, vomiting, or acute GI distress Musculoskeletal: Denies any acute onset joint swelling, redness, loss of ROM, or weakness Neurological: No reported episodes of acute onset apraxia, aphasia, dysarthria, agnosia, amnesia, paralysis, loss of coordination, or loss of consciousness  Allergies  Ms. Pinela has No Known Allergies.  [place]  Drug: Ms. Cerino  reports that she does not use drugs. Alcohol:  reports that she does not drink alcohol. Tobacco:  reports that she has never smoked. She has never used smokeless tobacco. Medical:  has a past medical history of Arthritis; Displacement of lumbar intervertebral disc (06/04/2015); History of cardiac arrhythmia (06/04/2015); History of cervical spinal surgery (06/04/2015); Hyperlipidemia; Hypothyroidism (06/04/2015); and Thyroid disease. Family:  family history includes Cancer in her mother; Heart disease in her father.  Past Surgical History:  Procedure Laterality Date  . ABDOMINAL HYSTERECTOMY    . APPENDECTOMY    . BACK SURGERY  07/25/2016  . CARPAL TUNNEL RELEASE    . LUMBAR FUSION    . REPLACEMENT TOTAL KNEE     right and left  . TOTAL SHOULDER REPLACEMENT     Constitutional Exam  General appearance: Well nourished, well developed, and well hydrated. In no apparent acute distress Vitals:   12/17/16 1021  BP: 135/82  Pulse:  66  Resp: 16  Temp: 97.6 F (36.4 C)  TempSrc: Oral  SpO2: 99%  Weight: 220 lb (99.8 kg)  Height: '5\' 5"'  (1.651 m)   BMI Assessment: Estimated body mass index is 36.61 kg/m as calculated from the following:   Height as of this encounter: '5\' 5"'  (1.651 m).   Weight as of this encounter: 220 lb (99.8 kg).  BMI interpretation table: BMI level Category Range association with higher incidence of chronic pain  <18 kg/m2 Underweight   18.5-24.9 kg/m2 Ideal body weight   25-29.9 kg/m2 Overweight Increased incidence by 20%  30-34.9 kg/m2 Obese (Class I) Increased incidence by 68%  35-39.9 kg/m2 Severe obesity (Class II) Increased incidence by 136%  >40 kg/m2 Extreme obesity (Class III) Increased incidence by 254%   BMI Readings from Last 4 Encounters:  12/17/16 36.61 kg/m  11/19/16 36.61 kg/m  11/05/16 42.97 kg/m  10/01/16 36.44 kg/m   Wt Readings from Last 4 Encounters:  12/17/16 220 lb (99.8 kg)  11/19/16 220 lb (99.8 kg)  11/05/16 220 lb (99.8 kg)  10/01/16 219 lb (99.3 kg)  Psych/Mental status: Alert, oriented x 3 (person, place, & time)       Eyes: PERLA Respiratory: No evidence of acute respiratory distress  Cervical Spine Exam  Inspection: Well healed scar from previous spine surgery detected Alignment: Symmetrical Functional ROM: Unrestricted ROM      Stability: No instability detected Muscle strength & Tone: Functionally intact Sensory: Unimpaired Palpation: No palpable anomalies              Upper Extremity (UE) Exam    Side: Right upper extremity  Side: Left upper extremity  Inspection: No masses, redness, swelling, or asymmetry. No contractures  Inspection: No masses, redness, swelling, or asymmetry. No contractures  Functional ROM: Unrestricted ROM          Functional ROM: Unrestricted ROM          Muscle strength & Tone: Functionally intact  Muscle strength & Tone: Functionally intact  Sensory: Unimpaired  Sensory: Unimpaired  Palpation: No palpable  anomalies              Palpation: No palpable anomalies              Specialized Test(s): Deferred         Specialized Test(s): Deferred          Gait & Posture Assessment  Ambulation: Unassisted Gait: Relatively normal for age and body habitus Posture: WNL   Assessment  Primary Diagnosis & Pertinent Problem List: The primary encounter diagnosis was Cervical foraminal stenosis. 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  Status Diagnosis  Controlled Controlled Controlled 1. Cervical foraminal stenosis   2. Cervical central spinal stenosis   3. 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 Fibromyalgia   5. Chronic pain syndrome      Plan of Care  Pharmacotherapy (Medications Ordered): Meds ordered this encounter  Medications  . naloxone (NARCAN) 0.4 MG/ML injection    Sig: Inject 1 mL (0.4 mg total) into the muscle as needed (for pain medication overdose.). Inject into thigh muscle, then call 911.    Dispense:  2 mL    Refill:  0    Please instruct the patient in proper use of medication.    Order Specific Question:   Supervising Provider    Answer:   Milinda Pointer [[id number]]   New Prescriptions   NALOXONE (NARCAN) 0.4 MG/ML INJECTION    Inject 1 mL (0.4 mg total) into the muscle as needed (for pain medication overdose.). Inject into thigh muscle, then call 911.   Medications administered today: Ms. Youse had no medications administered during this visit. Lab-work, procedure(s), and/or referral(s): No orders of the defined types were placed in this encounter.  Imaging and/or referral(s): None  Interventional therapies: Planned, scheduled, and/or pending:   Not at this time.   Considering:   Diagnostic bilateral suprascapular NB Possible bilateral suprascapular RFA Diagnostic Left intra-articular shoulder  joint injection Bilateral intra-articular Hip injection Palliative right-sided L3-4 lumbar epidural steroid injection Palliative right-sided L4-5 transforaminal epidural steroid injection Palliative right-sided L2-3 lumbar epidural steroid injection Palliative right-sided C7-T1 cervical epidural steroid injection Diagnostic bilateral cervical facet block Possible bilateral cervical facet radiofrequency ablation Diagnostic bilateral lumbar facet block Possible bilateral lumbar facet radiofrequency ablation   Palliative PRN treatment(s):   Palliative right-sided L3-4 lumbar epidural steroid injection Palliative right-sided L4-5 transforaminal epidural steroid injection Palliative right-sided L2-3 lumbar epidural steroid injection Palliative right-sided C7-T1 cervical epidural steroid injection Palliative bilateral shoulder joint injection Palliative bilateral suprascapular nerve block   Provider-requested follow-up: No Follow-up on file.  Future Appointments Date Time Provider [place]  03/24/2017 9:00 AM [place], NP [facility] None   Primary Care Physician: Lenard Simmer, MD Location: [facility] Management Facility Note by: Vevelyn Francois NP Date: 12/17/2016; Time: 12:41 PM  Pain Score Disclaimer: We use the NRS-11 scale. This is a self-reported, subjective measurement of pain severity with only modest accuracy. It is used primarily to identify changes within a particular patient. It must be understood that outpatient pain scales are significantly less accurate that those used for research, where they can be applied under ideal controlled circumstances with minimal exposure to variables. In reality, the score is likely to be a combination of pain intensity and pain affect, where pain affect describes the degree of emotional arousal or changes in action readiness caused by the sensory experience of pain. Factors such as social and work situation, setting,  emotional state, anxiety levels, expectation, and prior pain experience may influence pain perception and show large inter-individual differences that may also be affected by time variables.  Patient instructions provided during this appointment: Patient Instructions   Pain Score  Introduction: The pain score used by this practice is the Verbal Numerical Rating Scale (VNRS-11). This is an 11-point scale. It is for adults and children 10 years or older. There are significant differences in how the pain score is reported, used, and applied. Forget everything you learned in the past and learn this scoring system.  General Information: The scale should reflect your current level of pain. Unless you are specifically asked for the level of your worst pain, or your average pain. If you are asked for one of these two,  then it should be understood that it is over the past 24 hours.  Basic Activities of Daily Living (ADL): Personal hygiene, dressing, eating, transferring, and using restroom.  Instructions: Most patients tend to report their level of pain as a combination of two factors, their physical pain and their psychosocial pain. This last one is also known as "suffering" and it is reflection of how physical pain affects you socially and psychologically. From now on, report them separately. From this point on, when asked to report your pain level, report only your physical pain. Use the following table for reference.  Pain Clinic Pain Levels (0-5/10)  Pain Level Score Description  No Pain 0   Mild pain 1 Nagging, annoying, but does not interfere with basic activities of daily living (ADL). Patients are able to eat, bathe, get dressed, toileting (being able to get on and off the toilet and perform personal hygiene functions), transfer (move in and out of bed or a chair without assistance), and maintain continence (able to control bladder and bowel functions). Blood pressure and heart rate are unaffected.  A normal heart rate for a healthy adult ranges from 60 to 100 bpm (beats per minute).   Mild to moderate pain 2 Noticeable and distracting. Impossible to hide from other people. More frequent flare-ups. Still possible to adapt and function close to normal. It can be very annoying and may have occasional stronger flare-ups. With discipline, patients may get used to it and adapt.   Moderate pain 3 Interferes significantly with activities of daily living (ADL). It becomes difficult to feed, bathe, get dressed, get on and off the toilet or to perform personal hygiene functions. Difficult to get in and out of bed or a chair without assistance. Very distracting. With effort, it can be ignored when deeply involved in activities.   Moderately severe pain 4 Impossible to ignore for more than a few minutes. With effort, patients may still be able to manage work or participate in some social activities. Very difficult to concentrate. Signs of autonomic nervous system discharge are evident: dilated pupils (mydriasis); mild sweating (diaphoresis); sleep interference. Heart rate becomes elevated (>115 bpm). Diastolic blood pressure (lower number) rises above 100 mmHg. Patients find relief in laying down and not moving.   Severe pain 5 Intense and extremely unpleasant. Associated with frowning face and frequent crying. Pain overwhelms the senses.  Ability to do any activity or maintain social relationships becomes significantly limited. Conversation becomes difficult. Pacing back and forth is common, as getting into a comfortable position is nearly impossible. Pain wakes you up from deep sleep. Physical signs will be obvious: pupillary dilation; increased sweating; goosebumps; brisk reflexes; cold, clammy hands and feet; nausea, vomiting or dry heaves; loss of appetite; significant sleep disturbance with inability to fall asleep or to remain asleep. When persistent, significant weight loss is observed due to the complete  loss of appetite and sleep deprivation.  Blood pressure and heart rate becomes significantly elevated. Caution: If elevated blood pressure triggers a pounding headache associated with blurred vision, then the patient should immediately seek attention at an urgent or emergency care unit, as these may be signs of an impending stroke.    Emergency Department Pain Levels (6-10/10)  Emergency Room Pain 6 Severely limiting. Requires emergency care and should not be seen or managed at an outpatient pain management facility. Communication becomes difficult and requires great effort. Assistance to reach the emergency department may be required. Facial flushing and profuse sweating along with potentially  dangerous increases in heart rate and blood pressure will be evident.   Distressing pain 7 Self-care is very difficult. Assistance is required to transport, or use restroom. Assistance to reach the emergency department will be required. Tasks requiring coordination, such as bathing and getting dressed become very difficult.   Disabling pain 8 Self-care is no longer possible. At this level, pain is disabling. The individual is unable to do even the most "basic" activities such as walking, eating, bathing, dressing, transferring to a bed, or toileting. Fine motor skills are lost. It is difficult to think clearly.   Incapacitating pain 9 Pain becomes incapacitating. Thought processing is no longer possible. Difficult to remember your own name. Control of movement and coordination are lost.   The worst pain imaginable 10 At this level, most patients pass out from pain. When this level is reached, collapse of the autonomic nervous system occurs, leading to a sudden drop in blood pressure and heart rate. This in turn results in a temporary and dramatic drop in blood flow to the brain, leading to a loss of consciousness. Fainting is one of the body's self defense mechanisms. Passing out puts the brain in a calmed state  and causes it to shut down for a while, in order to begin the healing process.    Summary: 1. Refer to this scale when providing [place] with your pain level. 2. Be accurate and careful when reporting your pain level. This will help with your care. 3. Over-reporting your pain level will lead to loss of credibility. 4. Even a level of 1/10 means that there is pain and will be treated at our facility. 5. High, inaccurate reporting will be documented as "Symptom Exaggeration", leading to loss of credibility and suspicions of possible secondary gains such as obtaining more narcotics, or wanting to appear disabled, for fraudulent reasons. 6. Only pain levels of 5 or below will be seen at our facility. 7. Pain levels of 6 and above will be sent to the Emergency Department and the appointment cancelled. _____________________________________________________________________________________________

## 2016-12-17 NOTE — Patient Instructions (Addendum)
Pain Score  Introduction: The pain score used by this practice is the Verbal Numerical Rating Scale (VNRS-11). This is an 11-point scale. It is for adults and children 10 years or older. There are significant differences in how the pain score is reported, used, and applied. Forget everything you learned in the past and learn this scoring system.  General Information: The scale should reflect your current level of pain. Unless you are specifically asked for the level of your worst pain, or your average pain. If you are asked for one of these two, then it should be understood that it is over the past 24 hours.  Basic Activities of Daily Living (ADL): Personal hygiene, dressing, eating, transferring, and using restroom.  Instructions: Most patients tend to report their level of pain as a combination of two factors, their physical pain and their psychosocial pain. This last one is also known as "suffering" and it is reflection of how physical pain affects you socially and psychologically. From now on, report them separately. From this point on, when asked to report your pain level, report only your physical pain. Use the following table for reference.  Pain Clinic Pain Levels (0-5/10)  Pain Level Score Description  No Pain 0   Mild pain 1 Nagging, annoying, but does not interfere with basic activities of daily living (ADL). Patients are able to eat, bathe, get dressed, toileting (being able to get on and off the toilet and perform personal hygiene functions), transfer (move in and out of bed or a chair without assistance), and maintain continence (able to control bladder and bowel functions). Blood pressure and heart rate are unaffected. A normal heart rate for a healthy adult ranges from 60 to 100 bpm (beats per minute).   Mild to moderate pain 2 Noticeable and distracting. Impossible to hide from other people. More frequent flare-ups. Still possible to adapt and function close to normal. It can be very  annoying and may have occasional stronger flare-ups. With discipline, patients may get used to it and adapt.   Moderate pain 3 Interferes significantly with activities of daily living (ADL). It becomes difficult to feed, bathe, get dressed, get on and off the toilet or to perform personal hygiene functions. Difficult to get in and out of bed or a chair without assistance. Very distracting. With effort, it can be ignored when deeply involved in activities.   Moderately severe pain 4 Impossible to ignore for more than a few minutes. With effort, patients may still be able to manage work or participate in some social activities. Very difficult to concentrate. Signs of autonomic nervous system discharge are evident: dilated pupils (mydriasis); mild sweating (diaphoresis); sleep interference. Heart rate becomes elevated (>115 bpm). Diastolic blood pressure (lower number) rises above 100 mmHg. Patients find relief in laying down and not moving.   Severe pain 5 Intense and extremely unpleasant. Associated with frowning face and frequent crying. Pain overwhelms the senses.  Ability to do any activity or maintain social relationships becomes significantly limited. Conversation becomes difficult. Pacing back and forth is common, as getting into a comfortable position is nearly impossible. Pain wakes you up from deep sleep. Physical signs will be obvious: pupillary dilation; increased sweating; goosebumps; brisk reflexes; cold, clammy hands and feet; nausea, vomiting or dry heaves; loss of appetite; significant sleep disturbance with inability to fall asleep or to remain asleep. When persistent, significant weight loss is observed due to the complete loss of appetite and sleep deprivation.  Blood pressure and heart   rate becomes significantly elevated. Caution: If elevated blood pressure triggers a pounding headache associated with blurred vision, then the patient should immediately seek attention at an urgent or  emergency care unit, as these may be signs of an impending stroke.    Emergency Department Pain Levels (6-10/10)  Emergency Room Pain 6 Severely limiting. Requires emergency care and should not be seen or managed at an outpatient pain management facility. Communication becomes difficult and requires great effort. Assistance to reach the emergency department may be required. Facial flushing and profuse sweating along with potentially dangerous increases in heart rate and blood pressure will be evident.   Distressing pain 7 Self-care is very difficult. Assistance is required to transport, or use restroom. Assistance to reach the emergency department will be required. Tasks requiring coordination, such as bathing and getting dressed become very difficult.   Disabling pain 8 Self-care is no longer possible. At this level, pain is disabling. The individual is unable to do even the most "basic" activities such as walking, eating, bathing, dressing, transferring to a bed, or toileting. Fine motor skills are lost. It is difficult to think clearly.   Incapacitating pain 9 Pain becomes incapacitating. Thought processing is no longer possible. Difficult to remember your own name. Control of movement and coordination are lost.   The worst pain imaginable 10 At this level, most patients pass out from pain. When this level is reached, collapse of the autonomic nervous system occurs, leading to a sudden drop in blood pressure and heart rate. This in turn results in a temporary and dramatic drop in blood flow to the brain, leading to a loss of consciousness. Fainting is one of the body's self defense mechanisms. Passing out puts the brain in a calmed state and causes it to shut down for a while, in order to begin the healing process.    Summary: 1. Refer to this scale when providing [place] with your pain level. 2. Be accurate and careful when reporting your pain level. This will help with your care. 3. Over-reporting  your pain level will lead to loss of credibility. 4. Even a level of 1/10 means that there is pain and will be treated at our facility. 5. High, inaccurate reporting will be documented as "Symptom Exaggeration", leading to loss of credibility and suspicions of possible secondary gains such as obtaining more narcotics, or wanting to appear disabled, for fraudulent reasons. 6. Only pain levels of 5 or below will be seen at our facility. 7. Pain levels of 6 and above will be sent to the Emergency Department and the appointment cancelled. _____________________________________________________________________________________________  Living With Depression Everyone experiences occasional disappointment, sadness, and loss in their lives. When you are feeling down, blue, or sad for at least 2 weeks in a row, it may mean that you have depression. Depression can affect your thoughts and feelings, relationships, daily activities, and physical health. It is caused by changes in the way your brain functions. If you receive a diagnosis of depression, your health care provider will tell you which type of depression you have and what treatment options are available to you. If you are living with depression, there are ways to help you recover from it and also ways to prevent it from coming back. How to cope with lifestyle changes Coping with stress  Stress is your body's reaction to life changes and events, both good and bad. Stressful situations may include:  Getting married.  The death of a spouse.  Losing a job.  Retiring.  Having a baby. Stress can last just a few hours or it can be ongoing. Stress can play a major role in depression, so it is important to learn both how to cope with stress and how to think about it differently. Talk with your health care provider or a counselor if you would like to learn more about stress reduction. He or she may suggest some stress reduction techniques, such  as:  Music therapy. This can include creating music or listening to music. Choose music that you enjoy and that inspires you.  Mindfulness-based meditation. This kind of meditation can be done while sitting or walking. It involves being aware of your normal breaths, rather than trying to control your breathing.  Centering prayer. This is a kind of meditation that involves focusing on a spiritual word or phrase. Choose a word, phrase, or sacred image that is meaningful to you and that brings you peace.  Deep breathing. To do this, expand your stomach and inhale slowly through your nose. Hold your breath for 3-5 seconds, then exhale slowly, allowing your stomach muscles to relax.  Muscle relaxation. This involves intentionally tensing muscles then relaxing them. Choose a stress reduction technique that fits your lifestyle and personality. Stress reduction techniques take time and practice to develop. Set aside 5-15 minutes a day to do them. Therapists can offer training in these techniques. The training may be covered by some insurance plans. Other things you can do to manage stress include:  Keeping a stress diary. This can help you learn what triggers your stress and ways to control your response.  Understanding what your limits are and saying no to requests or events that lead to a schedule that is too full.  Thinking about how you respond to certain situations. You may not be able to control everything, but you can control how you react.  Adding humor to your life by watching funny films or TV shows.  Making time for activities that help you relax and not feeling guilty about spending your time this way. Medicines  Your health care provider may suggest certain medicines if he or she feels that they will help improve your condition. Avoid using alcohol and other substances that may prevent your medicines from working properly (may interact). It is also important to:  Talk with your  pharmacist or health care provider about all the medicines that you take, their possible side effects, and what medicines are safe to take together.  Make it your goal to take part in all treatment decisions (shared decision-making). This includes giving input on the side effects of medicines. It is best if shared decision-making with your health care provider is part of your total treatment plan. If your health care provider prescribes a medicine, you may not notice the full benefits of it for 4-8 weeks. Most people who are treated for depression need to be on medicine for at least 6-12 months after they feel better. If you are taking medicines as part of your treatment, do not stop taking medicines without first talking to your health care provider. You may need to have the medicine slowly decreased (tapered) over time to decrease the risk of harmful side effects. Relationships  Your health care provider may suggest family therapy along with individual therapy and drug therapy. While there may not be family problems that are causing you to feel depressed, it is still important to make sure your family learns as much as they can about your mental  health. Having your family's support can help make your treatment successful. How to recognize changes in your condition Everyone has a different response to treatment for depression. Recovery from major depression happens when you have not had signs of major depression for two months. This may mean that you will start to:  Have more interest in doing activities.  Feel less hopeless than you did 2 months ago.  Have more energy.  Overeat less often, or have better or improving appetite.  Have better concentration. Your health care provider will work with you to decide the next steps in your recovery. It is also important to recognize when your condition is getting worse. Watch for these signs:  Having fatigue or low energy.  Eating too much or too  little.  Sleeping too much or too little.  Feeling restless, agitated, or hopeless.  Having trouble concentrating or making decisions.  Having unexplained physical complaints.  Feeling irritable, angry, or aggressive. Get help as soon as you or your family members notice these symptoms coming back. How to get support and help from others How to talk with friends and family members about your condition  Talking to friends and family members about your condition can provide you with one way to get support and guidance. Reach out to trusted friends or family members, explain your symptoms to them, and let them know that you are working with a health care provider to treat your depression. Financial resources  Not all insurance plans cover mental health care, so it is important to check with your insurance carrier. If paying for co-pays or counseling services is a problem, search for a local or county mental health care center. They may be able to offer public mental health care services at low or no cost when you are not able to see a private health care provider. If you are taking medicine for depression, you may be able to get the generic form, which may be less expensive. Some makers of prescription medicines also offer help to patients who cannot afford the medicines they need. Follow these instructions at home:  Get the right amount and quality of sleep.  Cut down on using caffeine, tobacco, alcohol, and other potentially harmful substances.  Try to exercise, such as walking or lifting small weights.  Take over-the-counter and prescription medicines only as told by your health care provider.  Eat a healthy diet that includes plenty of vegetables, fruits, whole grains, low-fat dairy products, and lean protein. Do not eat a lot of foods that are high in solid fats, added sugars, or salt.  Keep all follow-up visits as told by your health care provider. This is important. Contact a  health care provider if:  You stop taking your antidepressant medicines, and you have any of these symptoms:  Nausea.  Headache.  Feeling lightheaded.  Chills and body aches.  Not being able to sleep (insomnia).  You or your friends and family think your depression is getting worse. Get help right away if:  You have thoughts of hurting yourself or others. If you ever feel like you may hurt yourself or others, or have thoughts about taking your own life, get help right away. You can go to your nearest emergency department or call:  Your local emergency services (911 in the [place]  A suicide crisis helpline, such as the [place] at (830)686-6798. This is open 24-hours a day. Summary  If you are living with depression, there are ways to  help you recover from it and also ways to prevent it from coming back.  Work with your health care team to create a management plan that includes counseling, stress management techniques, and healthy lifestyle habits. This information is not intended to replace advice given to you by your health care provider. Make sure you discuss any questions you have with your health care provider. Document Released: 07/07/2016 Document Revised: 07/07/2016 Document Reviewed: 07/07/2016 [employer]vier Interactive Patient Education  2017 [place] was to follow up with psychiatrist as schedule and have them start her on a regimen to help control the depression

## 2016-12-17 NOTE — Progress Notes (Signed)
Safety precautions to be maintained throughout the outpatient stay will include: orient to surroundings, keep bed in low position, maintain call bell within reach at all times, provide assistance with transfer out of bed and ambulation.  

## 2017-03-10 ENCOUNTER — Other Ambulatory Visit: Payer: Self-pay | Admitting: Family Medicine

## 2017-03-10 DIAGNOSIS — M5412 Radiculopathy, cervical region: Secondary | ICD-10-CM

## 2017-03-17 ENCOUNTER — Ambulatory Visit
Admission: RE | Admit: 2017-03-17 | Discharge: 2017-03-17 | Disposition: A | Discharge status: Home / Self Care | Payer: Medicare Other | Source: Ambulatory Visit | Attending: Family Medicine | Admitting: Family Medicine

## 2017-03-17 DIAGNOSIS — M5412 Radiculopathy, cervical region: Secondary | ICD-10-CM | POA: Diagnosis present

## 2017-03-17 DIAGNOSIS — M4802 Spinal stenosis, cervical region: Secondary | ICD-10-CM | POA: Diagnosis not present

## 2017-03-24 ENCOUNTER — Encounter: Payer: Self-pay | Admitting: Nurse Practitioner

## 2017-03-24 ENCOUNTER — Ambulatory Visit: Payer: Medicare Other | Attending: Nurse Practitioner | Admitting: Nurse Practitioner

## 2017-03-24 VITALS — BP 146/73 | HR 66 | Temp 98.4°F | Resp 16 | Ht 65.0 in | Wt 226.0 lb

## 2017-03-24 DIAGNOSIS — Z809 Family history of malignant neoplasm, unspecified: Secondary | ICD-10-CM | POA: Diagnosis not present

## 2017-03-24 DIAGNOSIS — E039 Hypothyroidism, unspecified: Secondary | ICD-10-CM | POA: Insufficient documentation

## 2017-03-24 DIAGNOSIS — T402X5A Adverse effect of other opioids, initial encounter: Secondary | ICD-10-CM | POA: Diagnosis not present

## 2017-03-24 DIAGNOSIS — M791 Myalgia: Secondary | ICD-10-CM | POA: Diagnosis not present

## 2017-03-24 DIAGNOSIS — Z79899 Other long term (current) drug therapy: Secondary | ICD-10-CM | POA: Diagnosis not present

## 2017-03-24 DIAGNOSIS — M62838 Other muscle spasm: Secondary | ICD-10-CM

## 2017-03-24 DIAGNOSIS — E785 Hyperlipidemia, unspecified: Secondary | ICD-10-CM | POA: Insufficient documentation

## 2017-03-24 DIAGNOSIS — M5116 Intervertebral disc disorders with radiculopathy, lumbar region: Secondary | ICD-10-CM | POA: Insufficient documentation

## 2017-03-24 DIAGNOSIS — Z8249 Family history of ischemic heart disease and other diseases of the circulatory system: Secondary | ICD-10-CM | POA: Insufficient documentation

## 2017-03-24 DIAGNOSIS — Z96611 Presence of right artificial shoulder joint: Secondary | ICD-10-CM | POA: Insufficient documentation

## 2017-03-24 DIAGNOSIS — M48061 Spinal stenosis, lumbar region without neurogenic claudication: Secondary | ICD-10-CM | POA: Insufficient documentation

## 2017-03-24 DIAGNOSIS — M542 Cervicalgia: Secondary | ICD-10-CM | POA: Diagnosis present

## 2017-03-24 DIAGNOSIS — G894 Chronic pain syndrome: Secondary | ICD-10-CM

## 2017-03-24 DIAGNOSIS — Z79891 Long term (current) use of opiate analgesic: Secondary | ICD-10-CM | POA: Diagnosis not present

## 2017-03-24 DIAGNOSIS — M4802 Spinal stenosis, cervical region: Secondary | ICD-10-CM | POA: Diagnosis not present

## 2017-03-24 DIAGNOSIS — K5903 Drug induced constipation: Secondary | ICD-10-CM

## 2017-03-24 DIAGNOSIS — Z90710 Acquired absence of both cervix and uterus: Secondary | ICD-10-CM | POA: Insufficient documentation

## 2017-03-24 DIAGNOSIS — Z96619 Presence of unspecified artificial shoulder joint: Secondary | ICD-10-CM | POA: Insufficient documentation

## 2017-03-24 DIAGNOSIS — M9981 Other biomechanical lesions of cervical region: Secondary | ICD-10-CM | POA: Diagnosis not present

## 2017-03-24 DIAGNOSIS — M4722 Other spondylosis with radiculopathy, cervical region: Secondary | ICD-10-CM

## 2017-03-24 DIAGNOSIS — M150 Primary generalized (osteo)arthritis: Secondary | ICD-10-CM | POA: Diagnosis not present

## 2017-03-24 DIAGNOSIS — Z96659 Presence of unspecified artificial knee joint: Secondary | ICD-10-CM | POA: Insufficient documentation

## 2017-03-24 DIAGNOSIS — K5900 Constipation, unspecified: Secondary | ICD-10-CM | POA: Diagnosis not present

## 2017-03-24 DIAGNOSIS — M65341 Trigger finger, right ring finger: Secondary | ICD-10-CM | POA: Diagnosis not present

## 2017-03-24 DIAGNOSIS — M7918 Myalgia, other site: Secondary | ICD-10-CM

## 2017-03-24 DIAGNOSIS — R51 Headache: Secondary | ICD-10-CM | POA: Diagnosis not present

## 2017-03-24 DIAGNOSIS — M797 Fibromyalgia: Secondary | ICD-10-CM | POA: Diagnosis not present

## 2017-03-24 DIAGNOSIS — Z7982 Long term (current) use of aspirin: Secondary | ICD-10-CM | POA: Diagnosis not present

## 2017-03-24 DIAGNOSIS — M159 Polyosteoarthritis, unspecified: Secondary | ICD-10-CM

## 2017-03-24 DIAGNOSIS — Z9889 Other specified postprocedural states: Secondary | ICD-10-CM | POA: Diagnosis not present

## 2017-03-24 DIAGNOSIS — M1610 Unilateral primary osteoarthritis, unspecified hip: Secondary | ICD-10-CM | POA: Diagnosis not present

## 2017-03-24 MED ORDER — MELOXICAM 15 MG PO TABS: 15.0000 mg | ORAL_TABLET | Freq: Every day | ORAL | 0 refills | Status: DC

## 2017-03-24 NOTE — Patient Instructions (Addendum)
____________________________________________________________________________________________  Medication Rules  Applies to: All patients receiving prescriptions (written or electronic).  Pharmacy of record: Pharmacy where electronic prescriptions will be sent. If written prescriptions are taken to a different pharmacy, please inform the nursing staff. The pharmacy listed in the electronic medical record should be the one where you would like electronic prescriptions to be sent.  Prescription refills: Only during scheduled appointments. Applies to both, written and electronic prescriptions.  NOTE: The following applies primarily to controlled substances (Opioid* Pain Medications).   Patient's responsibilities: 1. Pain Pills: Bring all pain pills to every appointment (except for procedure appointments). 2. Pill Bottles: Bring pills in original pharmacy bottle. Always bring newest bottle. Bring bottle, even if empty. 3. Medication refills: You are responsible for knowing and keeping track of what medications you need refilled. The day before your appointment, write a list of all prescriptions that need to be refilled. Bring that list to your appointment and give it to the admitting nurse. Prescriptions will be written only during appointments. If you forget a medication, it will not be "Called in", "Faxed", or "electronically sent". You will need to get another appointment to get these prescribed. 4. Prescription Accuracy: You are responsible for carefully inspecting your prescriptions before leaving our office. Have the discharge nurse carefully go over each prescription with you, before taking them home. Make sure that your name is accurately spelled, that your address is correct. Check the name and dose of your medication to make sure it is accurate. Check the number of pills, and the written instructions to make sure they are clear and accurate. Make sure that you are given enough medication to  last until your next medication refill appointment. 5. Taking Medication: Take medication as prescribed. Never take more pills than instructed. Never take medication more frequently than prescribed. Taking less pills or less frequently is permitted and encouraged, when it comes to controlled substances (written prescriptions).  6. Inform other Doctors: Always inform, all of your healthcare providers, of all the medications you take. 7. Pain Medication from other Providers: You are not allowed to accept any additional pain medication from any other Doctor or Healthcare provider. There are two exceptions to this rule. (see below) In the event that you require additional pain medication, you are responsible for notifying us, as stated below. 8. Medication Agreement: You are responsible for carefully reading and following our Medication Agreement. This must be signed before receiving any prescriptions from our practice. Safely store a copy of your signed Agreement. Violations to the Agreement will result in no further prescriptions. (Additional copies of our Medication Agreement are available upon request.) 9. Laws, Rules, & Regulations: All patients are expected to follow all Federal and State Laws, Statutes, Rules, & Regulations. Ignorance of the Laws does not constitute a valid excuse. The use of any illegal substances is prohibited. 10. Adopted CDC guidelines & recommendations: Target dosing levels will be at or below 60 MME/day. Use of benzodiazepines** is not recommended.  Exceptions: There are only two exceptions to the rule of not receiving pain medications from other Healthcare Providers. 1. Exception #1 (Emergencies): In the event of an emergency (i.e.: accident requiring emergency care), you are allowed to receive additional pain medication. However, you are responsible for: As soon as you are able, call our office [phone], at any time of the day or night, and leave a message stating your  name, the date and nature of the emergency, and the name and dose of the medication   prescribed. In the event that your call is answered by a member of our staff, make sure to document and save the date, time, and the name of the person that took your information.  2. Exception #2 (Planned Surgery): In the event that you are scheduled by another doctor or dentist to have any type of surgery or procedure, you are allowed (for a period no longer than 30 days), to receive additional pain medication, for the acute post-op pain. However, in this case, you are responsible for picking up a copy of our "Post-op Pain Management for Surgeons" handout, and giving it to your surgeon or dentist. This document is available at our office, and does not require an appointment to obtain it. Simply go to our office during business hours (Monday-Thursday from 8:00 AM to 4:00 PM) (Friday 8:00 AM to 12:00 Noon) or if you have a scheduled appointment with [place], prior to your surgery, and ask for it by name. In addition, you will need to provide [place] with your name, name of your surgeon, type of surgery, and date of procedure or surgery.  *Opioid medications include: morphine, codeine, oxycodone, oxymorphone, hydrocodone, hydromorphone, meperidine, tramadol, tapentadol, buprenorphine, fentanyl, methadone. **Benzodiazepine medications include: diazepam (Valium), alprazolam (Xanax), clonazepam (Klonopine), lorazepam (Ativan), clorazepate (Tranxene), chlordiazepoxide (Librium), estazolam (Prosom), oxazepam (Serax), temazepam (Restoril), triazolam (Halcion)  ____________________________________________________________________________________________ BMI Assessment: Estimated body mass index is 37.61 kg/m as calculated from the following:   Height as of this encounter: 5\' 5"  (1.651 m).   Weight as of this encounter: 226 lb (102.5 kg).  BMI interpretation table: BMI level Category Range association with higher incidence of chronic pain   <18 kg/m2 Underweight   18.5-24.9 kg/m2 Ideal body weight   25-29.9 kg/m2 Overweight Increased incidence by 20%  30-34.9 kg/m2 Obese (Class I) Increased incidence by 68%  35-39.9 kg/m2 Severe obesity (Class II) Increased incidence by 136%  >40 kg/m2 Extreme obesity (Class III) Increased incidence by 254%   BMI Readings from Last 4 Encounters:  03/24/17 37.61 kg/m  12/17/16 36.61 kg/m  11/19/16 36.61 kg/m  11/05/16 42.97 kg/m   Wt Readings from Last 4 Encounters:  03/24/17 226 lb (102.5 kg)  12/17/16 220 lb (99.8 kg)  11/19/16 220 lb (99.8 kg)  11/05/16 220 lb (99.8 kg)   Trigger Point Injection Trigger points are areas where you have pain. A trigger point injection is a shot given in the trigger point to help relieve pain for a few days to a few months. Common places for trigger points include:  The neck.  The shoulders.  The upper back.  The lower back.  A trigger point injection will not cure long-lasting (chronic) pain permanently. These injections do not always work for every person, but for some people they can help to relieve pain for a few days to a few months. Tell a health care provider about:  Any allergies you have.  All medicines you are taking, including vitamins, herbs, eye drops, creams, and over-the-counter medicines.  Any problems you or family members have had with anesthetic medicines.  Any blood disorders you have.  Any surgeries you have had.  Any medical conditions you have. What are the risks? Generally, this is a safe procedure. However, problems may occur, including:  Infection.  Bleeding.  Allergic reaction to the injected medicine.  Irritation of the skin around the injection site.  What happens before the procedure?  Ask your health care provider about changing or stopping your regular medicines. This is especially important  if you are taking diabetes medicines or blood thinners. What happens during the procedure?  Your  health care provider will feel for trigger points. A marker may be used to circle the area for the injection.  The skin over the trigger point will be washed with a germ-killing (antiseptic) solution.  A thin needle is used for the shot. You may feel pain or a twitching feeling when the needle enters the trigger point.  A numbing solution may be injected into the trigger point. Sometimes a medicine to keep down swelling, redness, and warmth (inflammation) is also injected.  Your health care provider may move the needle around the area where the trigger point is located until the tightness and twitching goes away.  After the injection, your health care provider may put gentle pressure over the injection site.  The injection site will be covered with a bandage (dressing). The procedure may vary among health care providers and hospitals. What happens after the procedure?  The dressing can be taken off in a few hours or as told by your health care provider.  You may feel sore and stiff for 1-2 days. This information is not intended to replace advice given to you by your health care provider. Make sure you discuss any questions you have with your health care provider. Document Released: 07/24/2011 Document Revised: 04/06/2016 Document Reviewed: 01/22/2015 Elsevier Interactive Patient Education  2018 [employer]. Pain Management Discharge Instructions  General Discharge Instructions :  If you need to reach your doctor call: Monday-Friday 8:00 am - 4:00 pm at [phone] or toll free [phone].  After clinic hours [phone] to have operator reach doctor.  Bring all of your medication bottles to all your appointments in the pain clinic.  To cancel or reschedule your appointment with Pain Management please remember to call 24 hours in advance to avoid a fee.  Refer to the educational materials which you have been given on: General Risks, I had my Procedure. Discharge Instructions,  Post Sedation.  Post Procedure Instructions:  The drugs you were given will stay in your system until tomorrow, so for the next 24 hours you should not drive, make any legal decisions or drink any alcoholic beverages.  You may eat anything you prefer, but it is better to start with liquids then soups and crackers, and gradually work up to solid foods.  Please notify your doctor immediately if you have any unusual bleeding, trouble breathing or pain that is not related to your normal pain.  Depending on the type of procedure that was done, some parts of your body may feel week and/or numb.  This usually clears up by tonight or the next day.  Walk with the use of an assistive device or accompanied by an adult for the 24 hours.  You may use ice on the affected area for the first 24 hours.  Put ice in a Ziploc bag and cover with a towel and place against area 15 minutes on 15 minutes off.  You may switch to heat after 24 hours.Pain Management Discharge Instructions  General Discharge Instructions :  If you need to reach your doctor call: Monday-Friday 8:00 am - 4:00 pm at [phone] or toll free [phone].  After clinic hours [phone] to have operator reach doctor.  Bring all of your medication bottles to all your appointments in the pain clinic.  To cancel or reschedule your appointment with Pain Management please remember to call 24 hours in advance to avoid a fee.  Refer to the educational materials which you have been given on: General Risks, I had my Procedure. Discharge Instructions, Post Sedation.  Post Procedure Instructions:  The drugs you were given will stay in your system until tomorrow, so for the next 24 hours you should not drive, make any legal decisions or drink any alcoholic beverages.  You may eat anything you prefer, but it is better to start with liquids then soups and crackers, and gradually work up to solid foods.  Please notify your doctor immediately if  you have any unusual bleeding, trouble breathing or pain that is not related to your normal pain.  Depending on the type of procedure that was done, some parts of your body may feel week and/or numb.  This usually clears up by tonight or the next day.  Walk with the use of an assistive device or accompanied by an adult for the 24 hours.  You may use ice on the affected area for the first 24 hours.  Put ice in a Ziploc bag and cover with a towel and place against area 15 minutes on 15 minutes off.  You may switch to heat after 24 hours. Trigger Point Injection Trigger points are areas where you have pain. A trigger point injection is a shot given in the trigger point to help relieve pain for a few days to a few months. Common places for trigger points include:  The neck.  The shoulders.  The upper back.  The lower back.  A trigger point injection will not cure long-lasting (chronic) pain permanently. These injections do not always work for every person, but for some people they can help to relieve pain for a few days to a few months. Tell a health care provider about:  Any allergies you have.  All medicines you are taking, including vitamins, herbs, eye drops, creams, and over-the-counter medicines.  Any problems you or family members have had with anesthetic medicines.  Any blood disorders you have.  Any surgeries you have had.  Any medical conditions you have. What are the risks? Generally, this is a safe procedure. However, problems may occur, including:  Infection.  Bleeding.  Allergic reaction to the injected medicine.  Irritation of the skin around the injection site.  What happens before the procedure?  Ask your health care provider about changing or stopping your regular medicines. This is especially important if you are taking diabetes medicines or blood thinners. What happens during the procedure?  Your health care provider will feel for trigger points. A  marker may be used to circle the area for the injection.  The skin over the trigger point will be washed with a germ-killing (antiseptic) solution.  A thin needle is used for the shot. You may feel pain or a twitching feeling when the needle enters the trigger point.  A numbing solution may be injected into the trigger point. Sometimes a medicine to keep down swelling, redness, and warmth (inflammation) is also injected.  Your health care provider may move the needle around the area where the trigger point is located until the tightness and twitching goes away.  After the injection, your health care provider may put gentle pressure over the injection site.  The injection site will be covered with a bandage (dressing). The procedure may vary among health care providers and hospitals. What happens after the procedure?  The dressing can be taken off in a few hours or as told by your health care provider.  You may  feel sore and stiff for 1-2 days. This information is not intended to replace advice given to you by your health care provider. Make sure you discuss any questions you have with your health care provider. Document Released: 07/24/2011 Document Revised: 04/06/2016 Document Reviewed: 01/22/2015 Elsevier Interactive Patient Education  2018 [employer].

## 2017-03-24 NOTE — Progress Notes (Signed)
Nursing Pain Medication Assessment:  Safety precautions to be maintained throughout the outpatient stay will include: orient to surroundings, keep bed in low position, maintain call bell within reach at all times, provide assistance with transfer out of bed and ambulation.  Medication Inspection Compliance: Regina Campos did not comply with our request to bring her pills to be counted. She was reminded that bringing the medication bottles, even when empty, is a requirement.  Medication: None brought in. Pill/Patch Count: None available to be counted. Bottle Appearance: No container available. Did not bring bottle(s) to appointment. Filled Date: N/A Last Medication intake:  Today

## 2017-03-24 NOTE — Progress Notes (Addendum)
Patient's Name: Regina Campos  MRN: [id number]  Referring Provider: Lenard Simmer, MD  DOB: Feb 02, 1951  PCP: Lenard Simmer, MD  DOS: 03/24/2017  Note by: Vevelyn Francois NP  Service setting: Ambulatory outpatient  Specialty: Interventional Pain Management  Location: [facility] (AMB) Pain Management Facility    Patient type: Established    Primary Reason(s) for Visit: Encounter for prescription drug management. (Level of risk: moderate)  CC: Neck Pain (back of neck) and Back Pain (lower)  HPI  Regina Campos is a 66 y.o. year old, female patient, who comes today for a medication management evaluation. 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 Her primarily concern today is the Neck Pain (back of neck) and Back Pain (lower)  Pain Assessment: Location: Posterior Neck Radiating: across the shoulders Onset: More than a month ago Duration: Chronic pain Quality: Aching, Constant, Radiating Severity: 4   (hurting all over)/10 (self-reported pain score)  Note: Reported level is compatible with observation.                   Effect on ADL: pace self Timing: Constant Modifying factors: medicine, heating pad, ice pack  Regina Campos was last scheduled for an appointment on 12/17/2016 for medication management. During today's appointment we reviewed Regina Campos's chronic pain status, as well as her outpatient medication regimen. She states that she had a revision in her lumbar spine which has improved. She is concern that the Tramadol is not effective for her neck. She states that she has just started back on the the Meloxicam. She is knitting a lot. She admits that she does that this to help with her fingeers. She is having locking in her right hand the ring finger. She admits that she does have chronic constipation.   The patient  reports that she does not use drugs. Her body mass index is 37.61 kg/m.  Further details on both, my assessment(s), as well as the proposed treatment plan, please see below.  Controlled Substance Pharmacotherapy Assessment REMS (Risk Evaluation and Mitigation Strategy)  Analgesic:Tramadol 50 mg 2 tablets every 6 hours (400 mg/day) MME/day:40 mg/day  Lona Millard, RN  03/24/2017  9:26 AM  Sign at close encounter Nursing Pain Medication Assessment:  Safety precautions to be maintained throughout the outpatient stay will include: orient to surroundings, keep bed in low position, maintain call bell within reach at all times, provide assistance with transfer out of bed and ambulation.  Medication Inspection Compliance: Regina Campos did not comply with our request to bring her pills to be counted. She was reminded that bringing the medication bottles, even when empty, is a requirement.  Medication: None brought in. Pill/Patch Count: None available to be counted. Bottle Appearance: No container available. Did not  bring bottle(s) to appointment. Filled Date: N/A Last  Medication intake:  Today   Pharmacokinetics: Liberation and absorption (onset of action): WNL Distribution (time to peak effect): WNL Metabolism and excretion (duration of action): WNL         Pharmacodynamics: Desired effects: Analgesia: Regina Campos reports >50% benefit. Functional ability: Patient reports that medication allows her to accomplish basic ADLs Clinically meaningful improvement in function (CMIF): Sustained CMIF goals met Perceived effectiveness: Described as relatively effective, allowing for increase in activities of daily living (ADL) Undesirable effects: Side-effects or Adverse reactions: None reported Monitoring: [place] PMP: Online review of the past 71-monthperiod conducted. Compliant with practice rules and regulations List of all UDS test(s) done:  Lab Results  Component Value Date   TOXASSSELUR FINAL 09/18/2015   Last UDS on record: ToxAssure Select 13  Date Value Ref Range Status  09/18/2015 FINAL  Final    Comment:    ==================================================================== TOXASSURE SELECT 13 (MW) ==================================================================== Test                             Result       Flag       Units Drug Present and Declared for Prescription Verification   Tramadol                       PRESENT      EXPECTED   O-Desmethyltramadol            PRESENT      EXPECTED   N-Desmethyltramadol            PRESENT      EXPECTED    Source of tramadol is a prescription medication.    O-desmethyltramadol and N-desmethyltramadol are expected    metabolites of tramadol. ==================================================================== Test                      Result    Flag   Units      Ref Range   Creatinine              89               mg/dL      >=20 ==================================================================== Declared Medications:  The flagging and interpretation on this report are based on the  following  declared medications.  Unexpected results may arise from  inaccuracies in the declared medications.  **Note: The testing scope of this panel includes these medications:  Tramadol ==================================================================== For clinical consultation, please call ([phone] ====================================================================    UDS interpretation: Compliant          Medication Assessment Form: Reviewed. Patient indicates being compliant with therapy Treatment compliance: Compliant Risk Assessment Profile: Aberrant behavior: See prior evaluations. None observed or detected today Comorbid factors increasing risk of overdose: See prior notes. No additional risks detected today Risk of substance use disorder (SUD): Low Opioid risk tool (ORT) (Total Score): 4     Opioid Risk Tool - 03/24/17 0924      Family History of Substance Abuse   Alcohol Positive Female   Illegal Drugs Negative   Rx Drugs Negative     Personal History of Substance Abuse   Alcohol Negative   Illegal Drugs Negative   Rx Drugs Negative     Age   Age between 171-45years  No     History of Preadolescent Sexual Abuse   History of Preadolescent Sexual  Abuse Negative or Female     Psychological Disease   Psychological Disease Negative   Depression Positive     Total Score   Opioid Risk Tool Scoring 4   Opioid Risk Interpretation Moderate Risk     ORT Scoring interpretation table:  Score <3 = Low Risk for SUD  Score between 4-7 = Moderate Risk for SUD  Score >8 = High Risk for Opioid Abuse   Risk Mitigation Strategies:  Patient Counseling: Covered Patient-Prescriber Agreement (PPA): Present and active  Notification to other healthcare providers: Done  Pharmacologic Plan: No change in therapy, at this time  Laboratory Chemistry  Inflammation Markers (CRP: Acute Phase) (ESR: Chronic Phase) Lab Results  Component Value Date   ESRSEDRATE 11 12/06/2012                  Renal Function Markers No results found for: BUN, CREATININE, GFRAA, GFRNONAA               Hepatic Function Markers No results found for: AST, ALT, ALBUMIN, ALKPHOS, HCVAB               Electrolytes No results found for: NA, K, CL, CALCIUM, MG               Neuropathy Markers No results found for: [id number]               Bone Pathology Markers No results found for: Hendricks Milo, VD125OH2TOT, [id number], IR4431VQ0, 25OHVITD1, 25OHVITD2, 25OHVITD3, CALCIUM, TESTOFREE, TESTOSTERONE               Coagulation Parameters No results found for: INR, LABPROT, APTT, PLT               Cardiovascular Markers No results found for: BNP, HGB, HCT               Note: Lab results reviewed.  Recent Diagnostic Imaging Review  Mr Cervical Spine Wo Contrast  Result Date: 03/17/2017 CLINICAL DATA:  Neck pain for 1 year.  Bilateral hand weakness. EXAM: MRI CERVICAL SPINE WITHOUT CONTRAST TECHNIQUE: Multiplanar, multisequence MR imaging of the cervical spine was performed. No intravenous contrast was administered. COMPARISON:  Cervical spine MRI 10/15/2012 FINDINGS: Alignment: Physiologic. Vertebrae: There is C4-C7 ACDF.  No focal marrow abnormality. Cord: Normal signal and morphology. Posterior Fossa, vertebral arteries, paraspinal tissues: Visualized posterior fossa is normal. Vertebral artery flow voids are preserved. Normal visualized paraspinal soft tissues. Disc levels: C1-C2: Normal. C2-C3: Normal disc space and facets. No spinal canal or neuroforaminal stenosis. C3-C4: Bilateral mild uncovertebral hypertrophy with mild right and moderate left neural foraminal stenosis. C4-C5: Postfusion changes.  No stenosis. C5-C6: Postfusion changes. Left uncovertebral spurring with mild left foraminal stenosis. C6-C7: Postfusion changes. Small central disc osteophyte complex without spinal canal stenosis. Mild bilateral uncovertebral spurring with mild narrowing of the left neural foramen. C7-T1: Normal  disc space and facets. No spinal canal or neuroforaminal stenosis. IMPRESSION: 1. C4-C7 ACDF without spinal canal stenosis. 2. Mild right and moderate left foraminal stenosis at C3-C4. 3. Mild left foraminal stenosis at C5-C6 and C6-C7 secondary to uncovertebral spurring. Electronically Signed   By: Ulyses Jarred M.D.   On: 03/17/2017 14:39   Note: Imaging results reviewed.          Meds   Current Meds  Medication Sig  . albuterol (PROVENTIL HFA;VENTOLIN HFA) 108 (90 Base) MCG/ACT inhaler Inhale into the lungs.  Marland Kitchen aspirin 81 MG tablet Take 81 mg by mouth daily.  Marland Kitchen  budesonide-formoterol (SYMBICORT) 160-4.5 MCG/ACT inhaler Inhale 2 puffs into the lungs 2 (two) times daily.  Marland Kitchen docusate sodium (COLACE) 100 MG capsule Take 2 capsules (200 mg total) by mouth at bedtime as needed for moderate constipation.  . fenofibrate (TRICOR) 145 MG tablet Take 145 mg by mouth daily.  Marland Kitchen levothyroxine (SYNTHROID, LEVOTHROID) 125 MCG tablet Take 125 mcg by mouth daily before breakfast.  . lubiprostone (AMITIZA) 24 MCG capsule Take 24 mcg by mouth 2 (two) times daily with a meal.  . montelukast (SINGULAIR) 10 MG tablet Take by mouth.  . Multiple Vitamin (MULTI-VITAMINS) TABS Take by mouth.  . naloxone (NARCAN) 0.4 MG/ML injection Inject 1 mL (0.4 mg total) into the muscle as needed (for pain medication overdose.). Inject into thigh muscle, then call 911.  . naloxone (NARCAN) 0.4 MG/ML injection Inject into the muscle.  . nitroGLYCERIN (NITROSTAT) 0.4 MG SL tablet TAKE ONE TABLET SUBLINGUALLY EVERY 5 MINUTES X 3 FOR CHEST PAIN  . rosuvastatin (CRESTOR) 10 MG tablet Take 10 mg by mouth daily.  Marland Kitchen tiotropium (SPIRIVA) 18 MCG inhalation capsule Place into inhaler and inhale.  . traMADol (ULTRAM) 50 MG tablet Take 2 tablets (100 mg total) by mouth every 6 (six) hours as needed for moderate pain or severe pain.  . [DISCONTINUED] meloxicam (MOBIC) 15 MG tablet Take 1 tablet (15 mg total) by mouth daily.    ROS    Constitutional: Denies any fever or chills Gastrointestinal: No reported hemesis, hematochezia, vomiting, or acute GI distress Musculoskeletal: Denies any acute onset joint swelling, redness, loss of ROM, or weakness Neurological: No reported episodes of acute onset apraxia, aphasia, dysarthria, agnosia, amnesia, paralysis, loss of coordination, or loss of consciousness  Allergies  Ms. Millspaugh has No Known Allergies.  [place]  Drug: Ms. Steig  reports that she does not use drugs. Alcohol:  reports that she does not drink alcohol. Tobacco:  reports that she has never smoked. She has never used smokeless tobacco. Medical:  has a past medical history of Arthritis; Displacement of lumbar intervertebral disc (06/04/2015); History of cardiac arrhythmia (06/04/2015); History of cervical spinal surgery (06/04/2015); Hyperlipidemia; Hypothyroidism (06/04/2015); and Thyroid disease. Surgical: Ms. Sedam  has a past surgical history that includes Abdominal hysterectomy; Total shoulder replacement; Replacement total knee; Appendectomy; Lumbar fusion; Carpal tunnel release; and Back surgery (07/25/2016). Family: family history includes Cancer in her mother; Heart disease in her father.  Constitutional Exam  General appearance: Well nourished, well developed, and well hydrated. In no apparent acute distress Vitals:   03/24/17 0915  BP: (!) 146/73  Pulse: 66  Resp: 16  Temp: 98.4 F (36.9 C)  SpO2: 97%  Weight: 226 lb (102.5 kg)  Height: '5\' 5"'  (1.651 m)   BMI Assessment: Estimated body mass index is 37.61 kg/m as calculated from the following:   Height as of this encounter: '5\' 5"'  (1.651 m).   Weight as of this encounter: 226 lb (102.5 kg).  Psych/Mental status: Alert, oriented x 3 (person, place, & time)       Eyes: PERLA Respiratory: No evidence of acute respiratory distress  Cervical Spine Area Exam  Skin & Axial Inspection: No masses, redness, edema, swelling, or associated skin  lesions Alignment: Symmetrical Functional ROM: Unrestricted ROM      Stability: No instability detected Muscle Tone/Strength: Functionally intact. No obvious neuro-muscular anomalies detected. Sensory (Neurological): Unimpaired Palpation: Complains of area being tender to palpation              Upper Extremity (UE)  Exam    Side: Right upper extremity  Side: Left upper extremity  Skin & Extremity Inspection: Skin color, temperature, and hair growth are WNL. No peripheral edema or cyanosis. No masses, redness, swelling, asymmetry, or associated skin lesions. No contractures. Previous scars physical   Skin & Extremity Inspection: Skin color, temperature, and hair growth are WNL. No peripheral edema or cyanosis. No masses, redness, swelling, asymmetry, or associated skin lesions. No contractures.  Functional ROM: Unrestricted ROM          Functional ROM: Unrestricted ROM          Muscle Tone/Strength: Functionally intact. No obvious neuro-muscular anomalies detected. stiffness and locking of 4th digit right hand  Muscle Tone/Strength: Functionally intact. No obvious neuro-muscular anomalies detected.  Sensory (Neurological): Unimpaired  Sensory (Neurological): Unimpaired  Palpation: No palpable anomalies              Palpation: No palpable anomalies              Specialized Test(s): Deferred         Specialized Test(s): Deferred          Thoracic Spine Area Exam  Skin & Axial Inspection: No masses, redness, or swelling Alignment: Symmetrical Functional ROM: Unrestricted ROM Stability: No instability detected Muscle Tone/Strength: Functionally intact. No obvious neuro-muscular anomalies detected. Sensory (Neurological): Unimpaired Muscle strength & Tone: No palpable anomalies  Lumbar Spine Area Exam  Skin & Axial Inspection: No masses, redness, or swelling Alignment: Symmetrical Functional ROM: Unrestricted ROM      Stability: No instability detected Muscle Tone/Strength: Functionally  intact. No obvious neuro-muscular anomalies detected. Sensory (Neurological): Unimpaired Palpation: No palpable anomalies       Provocative Tests: Lumbar Hyperextension and rotation test: evaluation deferred today       Lumbar Lateral bending test: evaluation deferred today       Patrick's Maneuver: evaluation deferred today                    Gait & Posture Assessment  Ambulation: Unassisted Gait: Relatively normal for age and body habitus Posture: WNL   Lower Extremity Exam    Side: Right lower extremity  Side: Left lower extremity  Skin & Extremity Inspection: Skin color, temperature, and hair growth are WNL. No peripheral edema or cyanosis. No masses, redness, swelling, asymmetry, or associated skin lesions. No contractures.  Skin & Extremity Inspection: Skin color, temperature, and hair growth are WNL. No peripheral edema or cyanosis. No masses, redness, swelling, asymmetry, or associated skin lesions. No contractures.  Functional ROM: Unrestricted ROM          Functional ROM: Unrestricted ROM          Muscle Tone/Strength: Functionally intact. No obvious neuro-muscular anomalies detected.  Muscle Tone/Strength: Functionally intact. No obvious neuro-muscular anomalies detected.  Sensory (Neurological): Unimpaired  Sensory (Neurological): Unimpaired  Palpation: No palpable anomalies  Palpation: No palpable anomalies   Assessment  Primary Diagnosis & Pertinent Problem List: The primary encounter diagnosis was Cervical foraminal stenosis. Diagnoses of Cervical spondylosis with radiculopathy (Right side), Cervical paraspinal muscle spasm, Trigger finger, right ring finger, Myofascial pain, Osteoarthritis, Therapeutic opioid-induced constipation (OIC), Chronic pain syndrome, and Long term current use of opiate analgesic were also pertinent to this visit.  Status Diagnosis  Controlled Controlled Controlled 1. Cervical foraminal stenosis   2. Cervical spondylosis with radiculopathy (Right  side)   3. Cervical paraspinal muscle spasm   4. Trigger finger, right ring finger   5. Myofascial pain  6. Osteoarthritis   7. Therapeutic opioid-induced constipation (OIC)   8. Chronic pain syndrome   9. Long term current use of opiate analgesic     Problems updated and reviewed during this visit: Problem  Trigger Finger, Right Ring Finger   Plan of Care  Pharmacotherapy (Medications Ordered): Meds ordered this encounter  Medications  . meloxicam (MOBIC) 15 MG tablet    Sig: Take 1 tablet (15 mg total) by mouth daily.    Dispense:  180 tablet    Refill:  0    Do not place this medication, or any other prescription from our practice, on "Automatic Refill". Patient may have prescription filled one day early if pharmacy is closed on scheduled refill date.    Order Specific Question:   Supervising Provider    Answer:   Milinda Pointer [phone]   New Prescriptions   No medications on file  Patient to decrease tramadol 50 mg once a day in weekly increments until off Medications administered today: Ms. Keahey had no medications administered during this visit. Lab-work, procedure(s), and/or referral(s): Orders Placed This Encounter  Procedures  . Small Joint Injection/Arthrocentesis  . ToxASSURE Select 13 (MW), Urine   Imaging and/or referral(s): None  Interventional therapies: Planned, scheduled, and/or pending:    Right hand 4th digit trigger finger injection (small joint)   Considering:   Diagnostic bilateral suprascapular NB Possiblebilateral suprascapular RFA Diagnostic Left intra-articular shoulder joint injection Bilateral intra-articular Hip injection Palliative right-sided L3-4 lumbar epiduralsteroid injection Palliative right-sided L4-5 transforaminal epidural steroid injection Palliative right-sided L2-3 lumbar epiduralsteroid injection Palliative right-sided C7-T1 cervical epiduralsteroid injection Diagnostic bilateral cervical facet block Possible  bilateral cervical facet radiofrequencyablation Diagnostic bilateral lumbar facet block Possible bilateral lumbar facet radiofrequencyablation   Palliative PRN treatment(s):   Palliative right-sided L3-4 lumbar epiduralsteroid injection Palliative right-sided L4-5 transforaminal epiduralsteroid injection Palliative right-sided L2-3 lumbar epiduralsteroid injection Palliative right-sided C7-T1 cervical epiduralsteroid injection Palliative bilateral shoulder joint injection Palliative bilateral suprascapular nerve block   Provider-requested follow-up: Return in about 3 months (around 06/24/2017) for MedMgmt.  Future Appointments Date Time Provider [place]  04/07/2017 8:15 AM Milinda Pointer, MD [facility] None  06/24/2017 9:00 AM Vevelyn Francois, NP [facility] None   Primary Care Physician: Lenard Simmer, MD Location: [facility] Note by: Vevelyn Francois NP Date: 03/24/2017; Time: 1:21 PM  Pain Score Disclaimer: We use the NRS-11 scale. This is a self-reported, subjective measurement of pain severity with only modest accuracy. It is used primarily to identify changes within a particular patient. It must be understood that outpatient pain scales are significantly less accurate that those used for research, where they can be applied under ideal controlled circumstances with minimal exposure to variables. In reality, the score is likely to be a combination of pain intensity and pain affect, where pain affect describes the degree of emotional arousal or changes in action readiness caused by the sensory experience of pain. Factors such as social and work situation, setting, emotional state, anxiety levels, expectation, and prior pain experience may influence pain perception and show large inter-individual differences that may also be affected by time variables.  Patient instructions provided during this appointment: Patient Instructions     ____________________________________________________________________________________________  Medication Rules  Applies to: All patients receiving prescriptions (written or electronic).  Pharmacy of record: Pharmacy where electronic prescriptions will be sent. If written prescriptions are taken to a different pharmacy, please inform the nursing staff. The pharmacy listed in the electronic medical record should be  the one where you would like electronic prescriptions to be sent.  Prescription refills: Only during scheduled appointments. Applies to both, written and electronic prescriptions.  NOTE: The following applies primarily to controlled substances (Opioid* Pain Medications).   Patient's responsibilities: 1. Pain Pills: Bring all pain pills to every appointment (except for procedure appointments). 2. Pill Bottles: Bring pills in original pharmacy bottle. Always bring newest bottle. Bring bottle, even if empty. 3. Medication refills: You are responsible for knowing and keeping track of what medications you need refilled. The day before your appointment, write a list of all prescriptions that need to be refilled. Bring that list to your appointment and give it to the admitting nurse. Prescriptions will be written only during appointments. If you forget a medication, it will not be "Called in", "Faxed", or "electronically sent". You will need to get another appointment to get these prescribed. 4. Prescription Accuracy: You are responsible for carefully inspecting your prescriptions before leaving our office. Have the discharge nurse carefully go over each prescription with you, before taking them home. Make sure that your name is accurately spelled, that your address is correct. Check the name and dose of your medication to make sure it is accurate. Check the number of pills, and the written instructions to make sure they are clear and accurate. Make sure that you are given enough medication  to last until your next medication refill appointment. 5. Taking Medication: Take medication as prescribed. Never take more pills than instructed. Never take medication more frequently than prescribed. Taking less pills or less frequently is permitted and encouraged, when it comes to controlled substances (written prescriptions).  6. Inform other Doctors: Always inform, all of your healthcare providers, of all the medications you take. 7. Pain Medication from other Providers: You are not allowed to accept any additional pain medication from any other Doctor or Healthcare provider. There are two exceptions to this rule. (see below) In the event that you require additional pain medication, you are responsible for notifying us, as stated below. 8. Medication Agreement: You are responsible for carefully reading and following our Medication Agreement. This must be signed before receiving any prescriptions from our practice. Safely store a copy of your signed Agreement. Violations to the Agreement will result in no further prescriptions. (Additional copies of our Medication Agreement are available upon request.) 9. Laws, Rules, & Regulations: All patients are expected to follow all Federal and [employer], [employer], Rules, [employer] [facility]. Ignorance of the Laws does not constitute a valid excuse. The use of any illegal substances is prohibited. 10. Adopted CDC guidelines & recommendations: Target dosing levels will be at or below 60 MME/day. Use of benzodiazepines** is not recommended.  Exceptions: There are only two exceptions to the rule of not receiving pain medications from other Healthcare Providers. 1. Exception #1 (Emergencies): In the event of an emergency (i.e.: accident requiring emergency care), you are allowed to receive additional pain medication. However, you are responsible for: As soon as you are able, call our office [phone], at any time of the day or night, and leave a message stating your  name, the date and nature of the emergency, and the name and dose of the medication prescribed. In the event that your call is answered by a member of our staff, make sure to document and save the date, time, and the name of the person that took your information.  2. Exception #2 (Planned Surgery): In the event that you are scheduled by another  doctor or dentist to have any type of surgery or procedure, you are allowed (for a period no longer than 30 days), to receive additional pain medication, for the acute post-op pain. However, in this case, you are responsible for picking up a copy of our "Post-op Pain Management for Surgeons" handout, and giving it to your surgeon or dentist. This document is available at our office, and does not require an appointment to obtain it. Simply go to our office during business hours (Monday-Thursday from 8:00 AM to 4:00 PM) (Friday 8:00 AM to 12:00 Noon) or if you have a scheduled appointment with [place], prior to your surgery, and ask for it by name. In addition, you will need to provide [place] with your name, name of your surgeon, type of surgery, and date of procedure or surgery.  *Opioid medications include: morphine, codeine, oxycodone, oxymorphone, hydrocodone, hydromorphone, meperidine, tramadol, tapentadol, buprenorphine, fentanyl, methadone. **Benzodiazepine medications include: diazepam (Valium), alprazolam (Xanax), clonazepam (Klonopine), lorazepam (Ativan), clorazepate (Tranxene), chlordiazepoxide (Librium), estazolam (Prosom), oxazepam (Serax), temazepam (Restoril), triazolam (Halcion)  ____________________________________________________________________________________________ BMI Assessment: Estimated body mass index is 37.61 kg/m as calculated from the following:   Height as of this encounter: '5\' 5"'  (1.651 m).   Weight as of this encounter: 226 lb (102.5 kg).  BMI interpretation table: BMI level Category Range association with higher incidence of chronic pain    <18 kg/m2 Underweight   18.5-24.9 kg/m2 Ideal body weight   25-29.9 kg/m2 Overweight Increased incidence by 20%  30-34.9 kg/m2 Obese (Class I) Increased incidence by 68%  35-39.9 kg/m2 Severe obesity (Class II) Increased incidence by 136%  >40 kg/m2 Extreme obesity (Class III) Increased incidence by 254%   BMI Readings from Last 4 Encounters:  03/24/17 37.61 kg/m  12/17/16 36.61 kg/m  11/19/16 36.61 kg/m  11/05/16 42.97 kg/m   Wt Readings from Last 4 Encounters:  03/24/17 226 lb (102.5 kg)  12/17/16 220 lb (99.8 kg)  11/19/16 220 lb (99.8 kg)  11/05/16 220 lb (99.8 kg)   Trigger Point Injection Trigger points are areas where you have pain. A trigger point injection is a shot given in the trigger point to help relieve pain for a few days to a few months. Common places for trigger points include:  The neck.  The shoulders.  The upper back.  The lower back.  A trigger point injection will not cure long-lasting (chronic) pain permanently. These injections do not always work for every person, but for some people they can help to relieve pain for a few days to a few months. Tell a health care provider about:  Any allergies you have.  All medicines you are taking, including vitamins, herbs, eye drops, creams, and over-the-counter medicines.  Any problems you or family members have had with anesthetic medicines.  Any blood disorders you have.  Any surgeries you have had.  Any medical conditions you have. What are the risks? Generally, this is a safe procedure. However, problems may occur, including:  Infection.  Bleeding.  Allergic reaction to the injected medicine.  Irritation of the skin around the injection site.  What happens before the procedure?  Ask your health care provider about changing or stopping your regular medicines. This is especially important if you are taking diabetes medicines or blood thinners. What happens during the procedure?  Your  health care provider will feel for trigger points. A marker may be used to circle the area for the injection.  The skin over the trigger point will be washed with  a germ-killing (antiseptic) solution.  A thin needle is used for the shot. You may feel pain or a twitching feeling when the needle enters the trigger point.  A numbing solution may be injected into the trigger point. Sometimes a medicine to keep down swelling, redness, and warmth (inflammation) is also injected.  Your health care provider may move the needle around the area where the trigger point is located until the tightness and twitching goes away.  After the injection, your health care provider may put gentle pressure over the injection site.  The injection site will be covered with a bandage (dressing). The procedure may vary among health care providers and hospitals. What happens after the procedure?  The dressing can be taken off in a few hours or as told by your health care provider.  You may feel sore and stiff for 1-2 days. This information is not intended to replace advice given to you by your health care provider. Make sure you discuss any questions you have with your health care provider. Document Released: 07/24/2011 Document Revised: 04/06/2016 Document Reviewed: 01/22/2015 Elsevier Interactive Patient Education  2018 [employer]. Pain Management Discharge Instructions  General Discharge Instructions :  If you need to reach your doctor call: Monday-Friday 8:00 am - 4:00 pm at [phone] or toll free [phone].  After clinic hours [phone] to have operator reach doctor.  Bring all of your medication bottles to all your appointments in the pain clinic.  To cancel or reschedule your appointment with Pain Management please remember to call 24 hours in advance to avoid a fee.  Refer to the educational materials which you have been given on: General Risks, I had my Procedure. Discharge Instructions,  Post Sedation.  Post Procedure Instructions:  The drugs you were given will stay in your system until tomorrow, so for the next 24 hours you should not drive, make any legal decisions or drink any alcoholic beverages.  You may eat anything you prefer, but it is better to start with liquids then soups and crackers, and gradually work up to solid foods.  Please notify your doctor immediately if you have any unusual bleeding, trouble breathing or pain that is not related to your normal pain.  Depending on the type of procedure that was done, some parts of your body may feel week and/or numb.  This usually clears up by tonight or the next day.  Walk with the use of an assistive device or accompanied by an adult for the 24 hours.  You may use ice on the affected area for the first 24 hours.  Put ice in a Ziploc bag and cover with a towel and place against area 15 minutes on 15 minutes off.  You may switch to heat after 24 hours.Pain Management Discharge Instructions  General Discharge Instructions :  If you need to reach your doctor call: Monday-Friday 8:00 am - 4:00 pm at [phone] or toll free [phone].  After clinic hours [phone] to have operator reach doctor.  Bring all of your medication bottles to all your appointments in the pain clinic.  To cancel or reschedule your appointment with Pain Management please remember to call 24 hours in advance to avoid a fee.  Refer to the educational materials which you have been given on: General Risks, I had my Procedure. Discharge Instructions, Post Sedation.  Post Procedure Instructions:  The drugs you were given will stay in your system until tomorrow, so for the next 24 hours you should not drive,  make any legal decisions or drink any alcoholic beverages.  You may eat anything you prefer, but it is better to start with liquids then soups and crackers, and gradually work up to solid foods.  Please notify your doctor immediately if  you have any unusual bleeding, trouble breathing or pain that is not related to your normal pain.  Depending on the type of procedure that was done, some parts of your body may feel week and/or numb.  This usually clears up by tonight or the next day.  Walk with the use of an assistive device or accompanied by an adult for the 24 hours.  You may use ice on the affected area for the first 24 hours.  Put ice in a Ziploc bag and cover with a towel and place against area 15 minutes on 15 minutes off.  You may switch to heat after 24 hours. Trigger Point Injection Trigger points are areas where you have pain. A trigger point injection is a shot given in the trigger point to help relieve pain for a few days to a few months. Common places for trigger points include:  The neck.  The shoulders.  The upper back.  The lower back.  A trigger point injection will not cure long-lasting (chronic) pain permanently. These injections do not always work for every person, but for some people they can help to relieve pain for a few days to a few months. Tell a health care provider about:  Any allergies you have.  All medicines you are taking, including vitamins, herbs, eye drops, creams, and over-the-counter medicines.  Any problems you or family members have had with anesthetic medicines.  Any blood disorders you have.  Any surgeries you have had.  Any medical conditions you have. What are the risks? Generally, this is a safe procedure. However, problems may occur, including:  Infection.  Bleeding.  Allergic reaction to the injected medicine.  Irritation of the skin around the injection site.  What happens before the procedure?  Ask your health care provider about changing or stopping your regular medicines. This is especially important if you are taking diabetes medicines or blood thinners. What happens during the procedure?  Your health care provider will feel for trigger points. A  marker may be used to circle the area for the injection.  The skin over the trigger point will be washed with a germ-killing (antiseptic) solution.  A thin needle is used for the shot. You may feel pain or a twitching feeling when the needle enters the trigger point.  A numbing solution may be injected into the trigger point. Sometimes a medicine to keep down swelling, redness, and warmth (inflammation) is also injected.  Your health care provider may move the needle around the area where the trigger point is located until the tightness and twitching goes away.  After the injection, your health care provider may put gentle pressure over the injection site.  The injection site will be covered with a bandage (dressing). The procedure may vary among health care providers and hospitals. What happens after the procedure?  The dressing can be taken off in a few hours or as told by your health care provider.  You may feel sore and stiff for 1-2 days. This information is not intended to replace advice given to you by your health care provider. Make sure you discuss any questions you have with your health care provider. Document Released: 07/24/2011 Document Revised: 04/06/2016 Document Reviewed: 01/22/2015 Elsevier Interactive Patient  Education  2018 [employer].

## 2017-03-27 LAB — TOXASSURE SELECT 13 (MW), URINE

## 2017-04-07 ENCOUNTER — Ambulatory Visit: Payer: Medicare Other | Attending: Pain Medicine | Admitting: Pain Medicine

## 2017-04-07 VITALS — BP 132/64 | HR 61 | Temp 98.3°F | Resp 18 | Ht 65.0 in | Wt 232.0 lb

## 2017-04-07 DIAGNOSIS — R51 Headache: Secondary | ICD-10-CM | POA: Insufficient documentation

## 2017-04-07 DIAGNOSIS — M65341 Trigger finger, right ring finger: Secondary | ICD-10-CM | POA: Diagnosis not present

## 2017-04-07 DIAGNOSIS — M4802 Spinal stenosis, cervical region: Secondary | ICD-10-CM | POA: Diagnosis present

## 2017-04-07 DIAGNOSIS — G4486 Cervicogenic headache: Secondary | ICD-10-CM

## 2017-04-07 DIAGNOSIS — M4722 Other spondylosis with radiculopathy, cervical region: Secondary | ICD-10-CM | POA: Insufficient documentation

## 2017-04-07 MED ORDER — LIDOCAINE HCL (PF) 1 % IJ SOLN
INTRAMUSCULAR | Status: AC
  Filled 2017-04-07: qty 5

## 2017-04-07 MED ORDER — ROPIVACAINE HCL 2 MG/ML IJ SOLN
INTRAMUSCULAR | Status: AC
  Filled 2017-04-07: qty 10

## 2017-04-07 MED ORDER — ROPIVACAINE HCL 2 MG/ML IJ SOLN
1.0000 mL | Freq: Once | INTRAMUSCULAR | Status: AC
  Administered 2017-04-07: 10 mL

## 2017-04-07 MED ORDER — DEXAMETHASONE SODIUM PHOSPHATE 10 MG/ML IJ SOLN
10.0000 mg | Freq: Once | INTRAMUSCULAR | Status: AC
  Administered 2017-04-07: 10 mg

## 2017-04-07 MED ORDER — METHYLPREDNISOLONE ACETATE 80 MG/ML IJ SUSP: 80.0000 mg | Freq: Once | INTRAMUSCULAR | Status: DC

## 2017-04-07 MED ORDER — DEXAMETHASONE SODIUM PHOSPHATE 10 MG/ML IJ SOLN
INTRAMUSCULAR | Status: AC
  Filled 2017-04-07: qty 1

## 2017-04-07 MED ORDER — LIDOCAINE HCL (PF) 1 % IJ SOLN
5.0000 mL | Freq: Once | INTRAMUSCULAR | Status: AC
  Administered 2017-04-07: 5 mL

## 2017-04-07 NOTE — Progress Notes (Signed)
Safety precautions to be maintained throughout the outpatient stay will include: orient to surroundings, keep bed in low position, maintain call bell within reach at all times, provide assistance with transfer out of bed and ambulation.  

## 2017-04-07 NOTE — Patient Instructions (Signed)

## 2017-04-07 NOTE — Progress Notes (Signed)
Patient's Name: Regina Campos  MRN: [id number]  Referring Provider: Lenard Simmer, MD  DOB: 1951-02-01  PCP: Lenard Simmer, MD  DOS: 04/07/2017  Note by: Gaspar Cola, MD  Service setting: Ambulatory outpatient  Specialty: Interventional Pain Management  Patient type: Established  Location: [facility] (AMB) Pain Management Facility  Visit type: Interventional Procedure   Primary Reason for Visit: Interventional Pain Management Treatment. CC: Hand Pain (right, fourth)  Procedure:  Anesthesia, Analgesia, Anxiolysis:  Type: Trigger Finger Ligament/Tendon sheath (20550) Injection. Purpose: Diagnostic Digit: No:4(Ring) Finger Laterality: Right-hand Position: Sitting Target Area: Flexor Digitorum Tendon sheath nodule Region: A-1(proximal) pulley of the metacarpal area Approach: Percutaneous  Type: Local Anesthesia Local Anesthetic: Lidocaine 1% Route: Infiltration ([place]/IM) IV Access: Declined Sedation: Declined  Indication(s): Analgesia          Indications: 1. Trigger finger, right ring finger   2. Cervical central spinal stenosis   3. Cervical spondylosis with radiculopathy (Right side)   4. Cervicogenic headache    Pain Score: Pre-procedure: 4 /10 Post-procedure: 0-No pain/10  Pre-op Assessment:  Regina Campos is a 66 y.o. (year old), female patient, seen today for interventional treatment. She  has a past surgical history that includes Abdominal hysterectomy; Total shoulder replacement; Replacement total knee; Appendectomy; Lumbar fusion; Carpal tunnel release; and Back surgery (07/25/2016). Regina Campos has a current medication list which includes the following prescription(s): albuterol, aspirin, budesonide-formoterol, fenofibrate, levothyroxine, lubiprostone, montelukast, naloxone, nitroglycerin, rosuvastatin, tiotropium, docusate sodium, and tramadol. Her primarily concern today is the Hand Pain (right, fourth)  Initial Vital Signs: There were no vitals taken for this  visit. BMI: Estimated body mass index is 38.61 kg/m as calculated from the following:   Height as of this encounter: 5\' 5"  (1.651 m).   Weight as of this encounter: 232 lb (105.2 kg).  Risk Assessment: Allergies: Reviewed. She has No Known Allergies.  Allergy Precautions: None required Coagulopathies: Reviewed. None identified.  Blood-thinner therapy: None at this time Active Infection(s): Reviewed. None identified. Regina Campos is afebrile  Site Confirmation: Regina Campos was asked to confirm the procedure and laterality before marking the site Procedure checklist: Completed Consent: Before the procedure and under the influence of no sedative(s), amnesic(s), or anxiolytics, the patient was informed of the treatment options, risks and possible complications. To fulfill our ethical and legal obligations, as recommended by the American Medical Association's Code of Ethics, I have informed the patient of my clinical impression; the nature and purpose of the treatment or procedure; the risks, benefits, and possible complications of the intervention; the alternatives, including doing nothing; the risk(s) and benefit(s) of the alternative treatment(s) or procedure(s); and the risk(s) and benefit(s) of doing nothing. The patient was provided information about the general risks and possible complications associated with the procedure. These may include, but are not limited to: failure to achieve desired goals, infection, bleeding, organ or nerve damage, allergic reactions, paralysis, and death. In addition, the patient was informed of those risks and complications associated to the procedure, such as failure to decrease pain; infection; bleeding; organ or nerve damage with subsequent damage to sensory, motor, and/or autonomic systems, resulting in permanent pain, numbness, and/or weakness of one or several areas of the body; allergic reactions; (i.e.: anaphylactic reaction); and/or death. Furthermore, the  patient was informed of those risks and complications associated with the medications. These include, but are not limited to: allergic reactions (i.e.: anaphylactic or anaphylactoid reaction(s)); adrenal axis suppression; blood sugar elevation that in diabetics may result in ketoacidosis  or comma; water retention that in patients with history of congestive heart failure may result in shortness of breath, pulmonary edema, and decompensation with resultant heart failure; weight gain; swelling or edema; medication-induced neural toxicity; particulate matter embolism and blood vessel occlusion with resultant organ, and/or nervous system infarction; and/or aseptic necrosis of one or more joints. Finally, the patient was informed that Medicine is not an exact science; therefore, there is also the possibility of unforeseen or unpredictable risks and/or possible complications that may result in a catastrophic outcome. The patient indicated having understood very clearly. We have given the patient no guarantees and we have made no promises. Enough time was given to the patient to ask questions, all of which were answered to the patient's satisfaction. Regina Campos has indicated that she wanted to continue with the procedure. Attestation: I, the ordering provider, attest that I have discussed with the patient the benefits, risks, side-effects, alternatives, likelihood of achieving goals, and potential problems during recovery for the procedure that I have provided informed consent. Date: 04/07/2017; Time: 7:55 AM  Pre-Procedure Preparation:  Monitoring: As per clinic protocol. Respiration, ETCO2, SpO2, BP, heart rate and rhythm monitor placed and checked for adequate function Safety Precautions: Patient was assessed for positional comfort and pressure points before starting the procedure. Time-out: I initiated and conducted the "Time-out" before starting the procedure, as per protocol. The patient was asked to  participate by confirming the accuracy of the "Time Out" information. Verification of the correct person, site, and procedure were performed and confirmed by me, the nursing staff, and the patient. "Time-out" conducted as per Joint Commission's Universal Protocol (UP.01.01.01). "Time-out" Date & Time: 04/07/2017; 0846 hrs.  Description of Procedure Process:   Area Prepped: Entire palmar and dorsal aspect of hand, up to forearm area. Prepping solution: ChloraPrep (2% chlorhexidine gluconate and 70% isopropyl alcohol) Safety Precautions: Aspiration looking for blood return was conducted prior to all injections. At no point did we inject any substances, as a needle was being advanced. No attempts were made at seeking any paresthesias. Safe injection practices and needle disposal techniques used. Medications properly checked for expiration dates. SDV (single dose vial) medications used. Description of the Procedure: Protocol guidelines were followed. The patient was placed in position. The target area was identified and prepped in the usual manner. Skin & deeper tissues infiltrated with local anesthetic. Appropriate time provided for local anesthetics to take effect. The procedure needle was slowly advanced to target area. Proper needle placement secured. Negative aspiration confirmed. Solution injected in intermittent fashion, asking for systemic symptoms every 0.5cc. Needle(s) removed and area cleaned, making sure to leave some prepping solution back to take advantage of its long term bactericidal properties. Vitals:   04/07/17 0810 04/07/17 0851  BP: 136/72 132/64  Pulse: (!) 58 61  Resp: 18 18  Temp: 98.3 F (36.8 C)   TempSrc: Oral   SpO2: 99% 100%  Weight: 232 lb (105.2 kg)   Height: 5\' 5"  (1.651 m)     Start Time: 0846 hrs. End Time: 0848 hrs. Materials:  Needle(s) Type: Epidural needle Gauge: 20G Length: 3.5-in Medication(s): We administered lidocaine (PF), ropivacaine (PF) 2 mg/mL  (0.2%), and dexamethasone. Please see chart orders for dosing details.    Imaging Guidance:  Type of Imaging Technique: None used Indication(s): N/A Exposure Time: No patient exposure Contrast: None used. Fluoroscopic Guidance: N/A Ultrasound Guidance: N/A Interpretation: N/A  Post-operative Assessment:  EBL: None Complications: No immediate post-treatment complications observed by team, or reported by  patient. Note: The patient tolerated the entire procedure well. A repeat set of vitals were taken after the procedure and the patient was kept under observation following institutional policy, for this type of procedure. Post-procedural neurological assessment was performed, showing return to baseline, prior to discharge. The patient was provided with post-procedure discharge instructions, including a section on how to identify potential problems. Should any problems arise concerning this procedure, the patient was given instructions to immediately contact us, at any time, without hesitation. In any case, we plan to contact the patient by telephone for a follow-up status report regarding this interventional procedure. Comments:  No additional relevant information.  Plan of Care  Disposition: Discharge home  Discharge Date & Time: 04/07/2017; 0855 hrs.  Physician-requested Follow-up:  Return in about 2 weeks (around 04/21/2017) for Procedure (no sedation), in addition, post-procedure eval by Dr. Dossie Arbour in 2 weeks.  New Prescriptions   No medications on file   Future Appointments Date Time Provider [place]  05/04/2017 8:15 AM Milinda Pointer, MD [facility] None  06/24/2017 9:00 AM Vevelyn Francois, NP [facility] None   Imaging Orders  No imaging studies ordered today    Procedure Orders     Injection tendon or ligament     Cervical Epidural Injection  Medications ordered for procedure: Meds ordered this encounter  Medications  . lidocaine (PF) (XYLOCAINE) 1 % injection  5 mL  . ropivacaine (PF) 2 mg/mL (0.2%) (NAROPIN) injection 1 mL  . DISCONTD: methylPREDNISolone acetate (DEPO-MEDROL) injection 80 mg  . dexamethasone (DECADRON) injection 10 mg   Medications administered: We administered lidocaine (PF), ropivacaine (PF) 2 mg/mL (0.2%), and dexamethasone.  See the medical record for exact dosing, route, and time of administration.  Primary Care Physician: Lenard Simmer, MD Location: [facility] Note by: Gaspar Cola, MD Date: 04/07/2017; Time: 9:59 AM  Disclaimer:  Medicine is not an Chief Strategy Officer. The only guarantee in medicine is that nothing is guaranteed. It is important to note that the decision to proceed with this intervention was based on the information collected from the patient. The Data and conclusions were drawn from the patient's questionnaire, the interview, and the physical examination. Because the information was provided in large part by the patient, it cannot be guaranteed that it has not been purposely or unconsciously manipulated. Every effort has been made to obtain as much relevant data as possible for this evaluation. It is important to note that the conclusions that lead to this procedure are derived in large part from the available data. Always take into account that the treatment will also be dependent on availability of resources and existing treatment guidelines, considered by other Pain Management Practitioners as being common knowledge and practice, at the time of the intervention. For Medico-Legal purposes, it is also important to point out that variation in procedural techniques and pharmacological choices are the acceptable norm. The indications, contraindications, technique, and results of the above procedure should only be interpreted and judged by a Board-Certified Interventional Pain Specialist with extensive familiarity and expertise in the same exact procedure and technique.

## 2017-04-08 ENCOUNTER — Telehealth: Payer: Self-pay | Admitting: *Deleted

## 2017-04-08 NOTE — Telephone Encounter (Signed)
Attempted to call for post procedure follow-up. Message left. 

## 2017-04-30 ENCOUNTER — Ambulatory Visit: Admitting: Pain Medicine

## 2017-05-04 ENCOUNTER — Ambulatory Visit: Admitting: Pain Medicine

## 2017-05-06 ENCOUNTER — Telehealth: Payer: Self-pay | Admitting: Pain Medicine

## 2017-05-06 ENCOUNTER — Other Ambulatory Visit: Payer: Self-pay | Admitting: Nurse Practitioner

## 2017-05-06 DIAGNOSIS — M25511 Pain in right shoulder: Principal | ICD-10-CM

## 2017-05-06 DIAGNOSIS — G8929 Other chronic pain: Secondary | ICD-10-CM

## 2017-05-06 DIAGNOSIS — M25512 Pain in left shoulder: Principal | ICD-10-CM

## 2017-05-06 NOTE — Telephone Encounter (Signed)
Patient called asking for shoulder injections, did not see any orders in for this, please advise and I will call patient to schedule

## 2017-05-06 NOTE — Telephone Encounter (Signed)
Patient is requesting a shoulder injection.  I see a small joint injection ordered prn, but no large joint injection which is the shoulder.  Can you please follow up on this, and order if applicable.    Thank you.

## 2017-05-07 NOTE — Telephone Encounter (Signed)
Blanch Media, please get PA.

## 2017-05-12 ENCOUNTER — Ambulatory Visit (HOSPITAL_BASED_OUTPATIENT_CLINIC_OR_DEPARTMENT_OTHER): Payer: Medicare Other | Admitting: Pain Medicine

## 2017-05-12 ENCOUNTER — Encounter: Payer: Self-pay | Admitting: Pain Medicine

## 2017-05-12 ENCOUNTER — Ambulatory Visit
Admission: RE | Admit: 2017-05-12 | Discharge: 2017-05-12 | Disposition: A | Discharge status: Home / Self Care | Payer: Medicare Other | Source: Ambulatory Visit | Attending: Pain Medicine | Admitting: Pain Medicine

## 2017-05-12 VITALS — BP 120/70 | HR 64 | Temp 98.2°F | Resp 18 | Ht 65.0 in | Wt 236.0 lb

## 2017-05-12 DIAGNOSIS — T402X5A Adverse effect of other opioids, initial encounter: Secondary | ICD-10-CM | POA: Insufficient documentation

## 2017-05-12 DIAGNOSIS — M19011 Primary osteoarthritis, right shoulder: Secondary | ICD-10-CM | POA: Diagnosis not present

## 2017-05-12 DIAGNOSIS — Z7982 Long term (current) use of aspirin: Secondary | ICD-10-CM | POA: Diagnosis not present

## 2017-05-12 DIAGNOSIS — Z981 Arthrodesis status: Secondary | ICD-10-CM | POA: Diagnosis not present

## 2017-05-12 DIAGNOSIS — M25511 Pain in right shoulder: Secondary | ICD-10-CM | POA: Diagnosis not present

## 2017-05-12 DIAGNOSIS — Z96659 Presence of unspecified artificial knee joint: Secondary | ICD-10-CM | POA: Diagnosis not present

## 2017-05-12 DIAGNOSIS — K5900 Constipation, unspecified: Secondary | ICD-10-CM | POA: Insufficient documentation

## 2017-05-12 DIAGNOSIS — M25512 Pain in left shoulder: Secondary | ICD-10-CM | POA: Diagnosis present

## 2017-05-12 DIAGNOSIS — M19012 Primary osteoarthritis, left shoulder: Secondary | ICD-10-CM | POA: Insufficient documentation

## 2017-05-12 DIAGNOSIS — G8929 Other chronic pain: Secondary | ICD-10-CM

## 2017-05-12 DIAGNOSIS — Z79899 Other long term (current) drug therapy: Secondary | ICD-10-CM | POA: Diagnosis not present

## 2017-05-12 DIAGNOSIS — K5903 Drug induced constipation: Secondary | ICD-10-CM

## 2017-05-12 DIAGNOSIS — G894 Chronic pain syndrome: Secondary | ICD-10-CM | POA: Diagnosis not present

## 2017-05-12 DIAGNOSIS — Z96619 Presence of unspecified artificial shoulder joint: Secondary | ICD-10-CM | POA: Insufficient documentation

## 2017-05-12 MED ORDER — LIDOCAINE HCL 2 % IJ SOLN
20.0000 mL | Freq: Once | INTRAMUSCULAR | Status: AC
  Administered 2017-05-12: 400 mg
  Filled 2017-05-12: qty 20

## 2017-05-12 MED ORDER — METHYLPREDNISOLONE ACETATE 80 MG/ML IJ SUSP
80.0000 mg | Freq: Once | INTRAMUSCULAR | Status: AC
  Administered 2017-05-12: 80 mg
  Filled 2017-05-12: qty 1

## 2017-05-12 MED ORDER — TRAMADOL HCL 50 MG PO TABS: 100.0000 mg | ORAL_TABLET | Freq: Four times a day (QID) | ORAL | 1 refills | Status: DC | PRN

## 2017-05-12 MED ORDER — LACTATED RINGERS IV SOLN
1000.0000 mL | Freq: Once | INTRAVENOUS | Status: DC
Start: 2017-05-12 — End: 2017-05-12

## 2017-05-12 MED ORDER — ROPIVACAINE HCL 2 MG/ML IJ SOLN
4.0000 mL | Freq: Once | INTRAMUSCULAR | Status: AC
  Administered 2017-05-12: 4 mL
  Filled 2017-05-12: qty 10

## 2017-05-12 MED ORDER — MIDAZOLAM HCL 5 MG/5ML IJ SOLN: 1.0000 mg | INTRAMUSCULAR | Status: DC | PRN

## 2017-05-12 MED ORDER — DOCUSATE SODIUM 100 MG PO CAPS: 200.0000 mg | ORAL_CAPSULE | Freq: Every evening | ORAL | 0 refills | Status: DC | PRN

## 2017-05-12 MED ORDER — FENTANYL CITRATE (PF) 100 MCG/2ML IJ SOLN
25.0000 ug | INTRAMUSCULAR | Status: DC | PRN
Start: 2017-05-12 — End: 2017-05-12

## 2017-05-12 NOTE — Progress Notes (Addendum)
Patient's Name: Regina Campos  MRN: [id number]  Referring Provider: Lenard Simmer, MD  DOB: 1950-08-20  PCP: Lenard Simmer, MD  DOS: 05/12/2017  Note by: Gaspar Cola, MD  Service setting: Ambulatory outpatient  Specialty: Interventional Pain Management  Patient type: Established  Location: [facility] (AMB) Pain Management Facility  Visit type: Interventional Procedure   Primary Reason for Visit: Interventional Pain Management Treatment. CC: Shoulder Pain (left)  Procedure:  Anesthesia, Analgesia, Anxiolysis:  Type: Therapeutic Glonohumeral Joint (shoulder) Injection Region: Superior Shoulder Area Level: Shoulder Laterality: Left-Sided  Type: Local Anesthesia Local Anesthetic: Lidocaine 1% Route: Infiltration ([place]/IM) IV Access: Declined Sedation: Declined  Indication(s): Analgesia          Indications: 1. Arthropathy of shoulder (Location of Tertiary source of pain) (Left)   2. Chronic shoulder pain (Location of Tertiary source of pain) (Bilateral) (L>R)   3. 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 Chronic pain syndrome   5. Therapeutic opioid-induced constipation (OIC)    Pain Score: Pre-procedure: 7 /10 Post-procedure: 0-No pain/10  Pre-op Assessment:  Regina Campos is a 66 y.o. (year old), female patient, seen today for interventional treatment. She  has a past surgical history that includes Abdominal hysterectomy; Total shoulder replacement; Replacement total knee; Appendectomy; Lumbar fusion; Carpal tunnel release; and Back surgery (07/25/2016). Regina Campos has a current medication list which includes the following prescription(s): albuterol, aspirin, budesonide-formoterol, fenofibrate, levothyroxine, lubiprostone, montelukast, naloxone, nitroglycerin, rosuvastatin, tiotropium, docusate sodium, and tramadol. Her primarily concern today is the Shoulder Pain (left)  Post-Procedure Assessment  05/06/2017 Procedure: Right sided trigger finger injection at the  A-1(proximal) pulley of the metacarpal area Pre-procedure pain score:  4/10 Post-procedure pain score: 0/10 (100% relief) Influential Factors: BMI: 39.27 kg/m Intra-procedural challenges: None observed.         Assessment challenges: None detected.              Reported side-effects: None.        Post-procedural adverse reactions or complications: None reported         Sedation: No sedation used. When no sedatives are used, the analgesic levels obtained are directly associated to the effectiveness of the local anesthetics. However, when sedation is provided, the level of analgesia obtained during the initial 1 hour following the intervention, is believed to be the result of a combination of factors. These factors may include, but are not limited to: 1. The effectiveness of the local anesthetics used. 2. The effects of the analgesic(s) and/or anxiolytic(s) used. 3. The degree of discomfort experienced by the patient at the time of the procedure. 4. The patients ability and reliability in recalling and recording the events. 5. The presence and influence of possible secondary gains and/or psychosocial factors. Reported result: Relief experienced during the 1st hour after the procedure: 100 % (Ultra-Short Term Relief)            Interpretative annotation: Clinically appropriate result. No IV Analgesic or Anxiolytic given, therefore benefits are completely due to Local Anesthetic effects.          Effects of local anesthetic: The analgesic effects attained during this period are directly associated to the localized infiltration of local anesthetics and therefore cary significant diagnostic value as to the etiological location, or anatomical origin, of the pain. Expected duration of relief is directly dependent on the pharmacodynamics of the local anesthetic used. Long-acting (4-6 hours) anesthetics used.  Reported result: Relief during the next 4 to 6 hour after the procedure: 100 % (  Short-Term  Relief)            Interpretative annotation: Clinically appropriate result. Analgesia during this period is likely to be Local Anesthetic-related.          Long-term benefit: Defined as the period of time past the expected duration of local anesthetics (1 hour for short-acting and 4-6 hours for long-acting). With the possible exception of prolonged sympathetic blockade from the local anesthetics, benefits during this period are typically attributed to, or associated with, other factors such as analgesic sensory neuropraxia, antiinflammatory effects, or beneficial biochemical changes provided by agents other than the local anesthetics.  Reported result: Extended relief following procedure: 100 % (Long-Term Relief)            Interpretative annotation: Clinically appropriate result. Good relief. No permanent benefit expected. Inflammation plays a part in the etiology to the pain.          Current benefits: Defined as reported results that persistent at this point in time.   Analgesia: 100 %            Function: Regina Campos reports improvement in function ROM: Regina Campos reports improvement in ROM Interpretative annotation: Recurrence of symptoms. No permanent benefit expected. Effective therapeutic approach.          Interpretation: Results would suggest a successful diagnostic intervention.                  Initial Vital Signs: There were no vitals taken for this visit. BMI: Estimated body mass index is 39.27 kg/m as calculated from the following:   Height as of this encounter: 5\' 5"  (1.651 m).   Weight as of this encounter: 236 lb (107 kg).  Risk Assessment: Allergies: Reviewed. She has No Known Allergies.  Allergy Precautions: None required Coagulopathies: Reviewed. None identified.  Blood-thinner therapy: None at this time Active Infection(s): Reviewed. None identified. Regina Campos is afebrile  Site Confirmation: Regina Campos was asked to confirm the procedure and laterality before  marking the site Procedure checklist: Completed Consent: Before the procedure and under the influence of no sedative(s), amnesic(s), or anxiolytics, the patient was informed of the treatment options, risks and possible complications. To fulfill our ethical and legal obligations, as recommended by the American Medical Association's Code of Ethics, I have informed the patient of my clinical impression; the nature and purpose of the treatment or procedure; the risks, benefits, and possible complications of the intervention; the alternatives, including doing nothing; the risk(s) and benefit(s) of the alternative treatment(s) or procedure(s); and the risk(s) and benefit(s) of doing nothing. The patient was provided information about the general risks and possible complications associated with the procedure. These may include, but are not limited to: failure to achieve desired goals, infection, bleeding, organ or nerve damage, allergic reactions, paralysis, and death. In addition, the patient was informed of those risks and complications associated to the procedure, such as failure to decrease pain; infection; bleeding; organ or nerve damage with subsequent damage to sensory, motor, and/or autonomic systems, resulting in permanent pain, numbness, and/or weakness of one or several areas of the body; allergic reactions; (i.e.: anaphylactic reaction); and/or death. Furthermore, the patient was informed of those risks and complications associated with the medications. These include, but are not limited to: allergic reactions (i.e.: anaphylactic or anaphylactoid reaction(s)); adrenal axis suppression; blood sugar elevation that in diabetics may result in ketoacidosis or comma; water retention that in patients with history of congestive heart failure may result in shortness of breath,  pulmonary edema, and decompensation with resultant heart failure; weight gain; swelling or edema; medication-induced neural toxicity;  particulate matter embolism and blood vessel occlusion with resultant organ, and/or nervous system infarction; and/or aseptic necrosis of one or more joints. Finally, the patient was informed that Medicine is not an exact science; therefore, there is also the possibility of unforeseen or unpredictable risks and/or possible complications that may result in a catastrophic outcome. The patient indicated having understood very clearly. We have given the patient no guarantees and we have made no promises. Enough time was given to the patient to ask questions, all of which were answered to the patient's satisfaction. Regina Campos has indicated that she wanted to continue with the procedure. Attestation: I, the ordering provider, attest that I have discussed with the patient the benefits, risks, side-effects, alternatives, likelihood of achieving goals, and potential problems during recovery for the procedure that I have provided informed consent. Date: 05/12/2017; Time: 7:34 AM  Pre-Procedure Preparation:  Monitoring: As per clinic protocol. Respiration, ETCO2, SpO2, BP, heart rate and rhythm monitor placed and checked for adequate function Safety Precautions: Patient was assessed for positional comfort and pressure points before starting the procedure. Time-out: I initiated and conducted the "Time-out" before starting the procedure, as per protocol. The patient was asked to participate by confirming the accuracy of the "Time Out" information. Verification of the correct person, site, and procedure were performed and confirmed by me, the nursing staff, and the patient. "Time-out" conducted as per Joint Commission's Universal Protocol (UP.01.01.01). "Time-out" Date & Time: 05/12/2017; 0945 hrs.  Description of Procedure Process:   Position: Supine Target Area: Glonohumeral Joint (shoulder) Approach: Anterior approach. Area Prepped: Entire shoulder Area Prepping solution: ChloraPrep (2% chlorhexidine gluconate  and 70% isopropyl alcohol) Safety Precautions: Aspiration looking for blood return was conducted prior to all injections. At no point did we inject any substances, as a needle was being advanced. No attempts were made at seeking any paresthesias. Safe injection practices and needle disposal techniques used. Medications properly checked for expiration dates. SDV (single dose vial) medications used. Description of the Procedure: Protocol guidelines were followed. The patient was placed in position over the procedure table. The target area was identified and the area prepped in the usual manner. Skin & deeper tissues infiltrated with local anesthetic. Appropriate amount of time allowed to pass for local anesthetics to take effect. The procedure needles were then advanced to the target area. Proper needle placement secured. Negative aspiration confirmed. Solution injected in intermittent fashion, asking for systemic symptoms every 0.5cc of injectate. The needles were then removed and the area cleansed, making sure to leave some of the prepping solution back to take advantage of its long term bactericidal properties. Vitals:   05/12/17 0913 05/12/17 0944 05/12/17 0950 05/12/17 0953  BP: (!) 123/55 119/69 (!) 98/46 120/70  Pulse: 64     Resp: 16 18 20 18   Temp: 98.2 F (36.8 C)     SpO2: 97% 97% 95% 98%  Weight:      Height:        Start Time: 0946 hrs. End Time: 0951 hrs. Materials:  Needle(s) Type: Regular needle Gauge: 22G Length: 3.5-in Medication(s): We administered methylPREDNISolone acetate, lidocaine, and ropivacaine (PF) 2 mg/mL (0.2%). Please see chart orders for dosing details.  Imaging Guidance (Non-Spinal):  Type of Imaging Technique: Fluoroscopy Guidance (Non-Spinal) Indication(s): Assistance in needle guidance and placement for procedures requiring needle placement in or near specific anatomical locations not easily accessible without such assistance.  Exposure Time: Please see  nurses notes. Contrast: None used. Fluoroscopic Guidance: I was personally present during the use of fluoroscopy. "Tunnel Vision Technique" used to obtain the best possible view of the target area. Parallax error corrected before commencing the procedure. "Direction-depth-direction" technique used to introduce the needle under continuous pulsed fluoroscopy. Once target was reached, antero-posterior, oblique, and lateral fluoroscopic projection used confirm needle placement in all planes. Images permanently stored in EMR. Interpretation: No contrast injected. I personally interpreted the imaging intraoperatively. Adequate needle placement confirmed in multiple planes. Permanent images saved into the patient's record.  Antibiotic Prophylaxis:  Indication(s): None identified Antibiotic given: None  Post-operative Assessment:  EBL: None Complications: No immediate post-treatment complications observed by team, or reported by patient. Note: The patient tolerated the entire procedure well. A repeat set of vitals were taken after the procedure and the patient was kept under observation following institutional policy, for this type of procedure. Post-procedural neurological assessment was performed, showing return to baseline, prior to discharge. The patient was provided with post-procedure discharge instructions, including a section on how to identify potential problems. Should any problems arise concerning this procedure, the patient was given instructions to immediately contact us, at any time, without hesitation. In any case, we plan to contact the patient by telephone for a follow-up status report regarding this interventional procedure. Comments:  No additional relevant information.  Plan of Care  Disposition: Discharge home  Discharge Date & Time: 05/12/2017; 1000 hrs.   Physician-requested Follow-up:  Return for post-procedure eval in 2 wks, by Dionisio David, NP.  Future Appointments Date Time  Provider [place]  05/26/2017 10:45 AM Vevelyn Francois, NP [facility] None  10/05/2017 9:00 AM Vevelyn Francois, NP [facility] None    Imaging Orders     DG C-Arm 1-60 Min-No Report  Procedure Orders     SHOULDER INJECTION  Medications ordered for procedure: Meds ordered this encounter  Medications  . methylPREDNISolone acetate (DEPO-MEDROL) injection 80 mg  . lidocaine (XYLOCAINE) 2 % (with pres) injection 400 mg  . ropivacaine (PF) 2 mg/mL (0.2%) (NAROPIN) injection 4 mL  . traMADol (ULTRAM) 50 MG tablet    Sig: Take 2 tablets (100 mg total) by mouth every 6 (six) hours as needed for moderate pain or severe pain.    Dispense:  720 tablet    Refill:  1    Fill one day early if pharmacy is closed on scheduled refill date.  To last until: 11/08/17  . docusate sodium (COLACE) 100 MG capsule    Sig: Take 2 capsules (200 mg total) by mouth at bedtime as needed for moderate constipation.    Dispense:  180 capsule    Refill:  0    Do not place this medication, or any other prescription from our practice, on "Automatic Refill". Patient may have prescription filled one day early if pharmacy is closed on scheduled refill date.   Medications administered: We administered methylPREDNISolone acetate, lidocaine, and ropivacaine (PF) 2 mg/mL (0.2%).  See the medical record for exact dosing, route, and time of administration.  New Prescriptions   No medications on file   Primary Care Physician: Lenard Simmer, MD Location: [facility] Note by: Gaspar Cola, MD Date: 05/12/2017; Time: 10:18 AM  Disclaimer:  Medicine is not an Chief Strategy Officer. The only guarantee in medicine is that nothing is guaranteed. It is important to note that the decision to proceed with this intervention was based on the information collected  from the patient. The Data and conclusions were drawn from the patient's questionnaire, the interview, and the physical examination.  Because the information was provided in large part by the patient, it cannot be guaranteed that it has not been purposely or unconsciously manipulated. Every effort has been made to obtain as much relevant data as possible for this evaluation. It is important to note that the conclusions that lead to this procedure are derived in large part from the available data. Always take into account that the treatment will also be dependent on availability of resources and existing treatment guidelines, considered by other Pain Management Practitioners as being common knowledge and practice, at the time of the intervention. For Medico-Legal purposes, it is also important to point out that variation in procedural techniques and pharmacological choices are the acceptable norm. The indications, contraindications, technique, and results of the above procedure should only be interpreted and judged by a Board-Certified Interventional Pain Specialist with extensive familiarity and expertise in the same exact procedure and technique.

## 2017-05-12 NOTE — Progress Notes (Signed)
Safety precautions to be maintained throughout the outpatient stay will include: orient to surroundings, keep bed in low position, maintain call bell within reach at all times, provide assistance with transfer out of bed and ambulation.  

## 2017-05-12 NOTE — Patient Instructions (Addendum)
____________________________________________________________________________________________  Pain Scale  Introduction: The pain score used by this practice is the Verbal Numerical Rating Scale (VNRS-11). This is an 11-point scale. It is for adults and children 10 years or older. There are significant differences in how the pain score is reported, used, and applied. Forget everything you learned in the past and learn this scoring system.  General Information: The scale should reflect your current level of pain. Unless you are specifically asked for the level of your worst pain, or your average pain. If you are asked for one of these two, then it should be understood that it is over the past 24 hours.  Basic Activities of Daily Living (ADL): Personal hygiene, dressing, eating, transferring, and using restroom.  Instructions: Most patients tend to report their level of pain as a combination of two factors, their physical pain and their psychosocial pain. This last one is also known as "suffering" and it is reflection of how physical pain affects you socially and psychologically. From now on, report them separately. From this point on, when asked to report your pain level, report only your physical pain. Use the following table for reference.  Pain Clinic Pain Levels (0-5/10)  Pain Level Score  Description  No Pain 0   Mild pain 1 Nagging, annoying, but does not interfere with basic activities of daily living (ADL). Patients are able to eat, bathe, get dressed, toileting (being able to get on and off the toilet and perform personal hygiene functions), transfer (move in and out of bed or a chair without assistance), and maintain continence (able to control bladder and bowel functions). Blood pressure and heart rate are unaffected. A normal heart rate for a healthy adult ranges from 60 to 100 bpm (beats per minute).   Mild to moderate pain 2 Noticeable and distracting. Impossible to hide from other  people. More frequent flare-ups. Still possible to adapt and function close to normal. It can be very annoying and may have occasional stronger flare-ups. With discipline, patients may get used to it and adapt.   Moderate pain 3 Interferes significantly with activities of daily living (ADL). It becomes difficult to feed, bathe, get dressed, get on and off the toilet or to perform personal hygiene functions. Difficult to get in and out of bed or a chair without assistance. Very distracting. With effort, it can be ignored when deeply involved in activities.   Moderately severe pain 4 Impossible to ignore for more than a few minutes. With effort, patients may still be able to manage work or participate in some social activities. Very difficult to concentrate. Signs of autonomic nervous system discharge are evident: dilated pupils (mydriasis); mild sweating (diaphoresis); sleep interference. Heart rate becomes elevated (>115 bpm). Diastolic blood pressure (lower number) rises above 100 mmHg. Patients find relief in laying down and not moving.   Severe pain 5 Intense and extremely unpleasant. Associated with frowning face and frequent crying. Pain overwhelms the senses.  Ability to do any activity or maintain social relationships becomes significantly limited. Conversation becomes difficult. Pacing back and forth is common, as getting into a comfortable position is nearly impossible. Pain wakes you up from deep sleep. Physical signs will be obvious: pupillary dilation; increased sweating; goosebumps; brisk reflexes; cold, clammy hands and feet; nausea, vomiting or dry heaves; loss of appetite; significant sleep disturbance with inability to fall asleep or to remain asleep. When persistent, significant weight loss is observed due to the complete loss of appetite and sleep deprivation.  Blood   pressure and heart rate becomes significantly elevated. Caution: If elevated blood pressure triggers a pounding headache  associated with blurred vision, then the patient should immediately seek attention at an urgent or emergency care unit, as these may be signs of an impending stroke.    Emergency Department Pain Levels (6-10/10)  Emergency Room Pain 6 Severely limiting. Requires emergency care and should not be seen or managed at an outpatient pain management facility. Communication becomes difficult and requires great effort. Assistance to reach the emergency department may be required. Facial flushing and profuse sweating along with potentially dangerous increases in heart rate and blood pressure will be evident.   Distressing pain 7 Self-care is very difficult. Assistance is required to transport, or use restroom. Assistance to reach the emergency department will be required. Tasks requiring coordination, such as bathing and getting dressed become very difficult.   Disabling pain 8 Self-care is no longer possible. At this level, pain is disabling. The individual is unable to do even the most "basic" activities such as walking, eating, bathing, dressing, transferring to a bed, or toileting. Fine motor skills are lost. It is difficult to think clearly.   Incapacitating pain 9 Pain becomes incapacitating. Thought processing is no longer possible. Difficult to remember your own name. Control of movement and coordination are lost.   The worst pain imaginable 10 At this level, most patients pass out from pain. When this level is reached, collapse of the autonomic nervous system occurs, leading to a sudden drop in blood pressure and heart rate. This in turn results in a temporary and dramatic drop in blood flow to the brain, leading to a loss of consciousness. Fainting is one of the body's self defense mechanisms. Passing out puts the brain in a calmed state and causes it to shut down for a while, in order to begin the healing process.    Summary: 1. Refer to this scale when providing [place] with your pain level. 2. Be  accurate and careful when reporting your pain level. This will help with your care. 3. Over-reporting your pain level will lead to loss of credibility. 4. Even a level of 1/10 means that there is pain and will be treated at our facility. 5. High, inaccurate reporting will be documented as "Symptom Exaggeration", leading to loss of credibility and suspicions of possible secondary gains such as obtaining more narcotics, or wanting to appear disabled, for fraudulent reasons. 6. Only pain levels of 5 or below will be seen at our facility. 7. Pain levels of 6 and above will be sent to the Emergency Department and the appointment cancelled. ____________________________________________________________________________________________  ____________________________________________________________________________________________  Post-Procedure instructions Instructions:  Apply ice: Fill a plastic sandwich bag with crushed ice. Cover it with a small towel and apply to injection site. Apply for 15 minutes then remove x 15 minutes. Repeat sequence on day of procedure, until you go to bed. The purpose is to minimize swelling and discomfort after procedure.  Apply heat: Apply heat to procedure site starting the day following the procedure. The purpose is to treat any soreness and discomfort from the procedure.  Food intake: Start with clear liquids (like water) and advance to regular food, as tolerated.   Physical activities: Keep activities to a minimum for the first 8 hours after the procedure.   Driving: If you have received any sedation, you are not allowed to drive for 24 hours after your procedure.  Blood thinner: Restart your blood thinner 6 hours after your procedure. (Only  for those taking blood thinners)  Insulin: As soon as you can eat, you may resume your normal dosing schedule. (Only for those taking insulin)  Infection prevention: Keep procedure site clean and dry.  Post-procedure Pain  Diary: Extremely important that this be done correctly and accurately. Recorded information will be used to determine the next step in treatment.  Pain evaluated is that of treated area only. Do not include pain from an untreated area.  Complete every hour, on the hour, for the initial 8 hours. Set an alarm to help you do this part accurately.  Do not go to sleep and have it completed later. It will not be accurate.  Follow-up appointment: Keep your follow-up appointment after the procedure. Usually 2 weeks for most procedures. (6 weeks in the case of radiofrequency.) Bring you pain diary.  Expect:  From numbing medicine (AKA: Local Anesthetics): Numbness or decrease in pain.  Onset: Full effect within 15 minutes of injected.  Duration: It will depend on the type of local anesthetic used. On the average, 1 to 8 hours.   From steroids: Decrease in swelling or inflammation. Once inflammation is improved, relief of the pain will follow.  Onset of benefits: Depends on the amount of swelling present. The more swelling, the longer it will take for the benefits to be seen. In some cases, up to 10 days.  Duration: Steroids will stay in the system x 2 weeks. Duration of benefits will depend on multiple posibilities including persistent irritating factors.  From procedure: Some discomfort is to be expected once the numbing medicine wears off. This should be minimal if ice and heat are applied as instructed. Call if:  You experience numbness and weakness that gets worse with time, as opposed to wearing off.  New onset bowel or bladder incontinence. (Spinal procedures only)  Emergency Numbers:  Durning business hours (Monday - Thursday, 8:00 AM - 4:00 PM) (Friday, 9:00 AM - 12:00 Noon): [phone]  After hours: [phone] ____________________________________________________________________________________________   Trigger Point Injection Trigger points are areas where you have  pain. A trigger point injection is a shot given in the trigger point to help relieve pain for a few days to a few months. Common places for trigger points include:  The neck.  The shoulders.  The upper back.  The lower back.  A trigger point injection will not cure long-lasting (chronic) pain permanently. These injections do not always work for every person, but for some people they can help to relieve pain for a few days to a few months. Tell a health care provider about:  Any allergies you have.  All medicines you are taking, including vitamins, herbs, eye drops, creams, and over-the-counter medicines.  Any problems you or family members have had with anesthetic medicines.  Any blood disorders you have.  Any surgeries you have had.  Any medical conditions you have. What are the risks? Generally, this is a safe procedure. However, problems may occur, including:  Infection.  Bleeding.  Allergic reaction to the injected medicine.  Irritation of the skin around the injection site.  What happens before the procedure?  Ask your health care provider about changing or stopping your regular medicines. This is especially important if you are taking diabetes medicines or blood thinners. What happens during the procedure?  Your health care provider will feel for trigger points. A marker may be used to circle the area for the injection.  The skin over the trigger point will be washed with  a germ-killing (antiseptic) solution.  A thin needle is used for the shot. You may feel pain or a twitching feeling when the needle enters the trigger point.  A numbing solution may be injected into the trigger point. Sometimes a medicine to keep down swelling, redness, and warmth (inflammation) is also injected.  Your health care provider may move the needle around the area where the trigger point is located until the tightness and twitching goes away.  After the injection, your health care  provider may put gentle pressure over the injection site.  The injection site will be covered with a bandage (dressing). The procedure may vary among health care providers and hospitals. What happens after the procedure?  The dressing can be taken off in a few hours or as told by your health care provider.  You may feel sore and stiff for 1-2 days. This information is not intended to replace advice given to you by your health care provider. Make sure you discuss any questions you have with your health care provider. Document Released: 07/24/2011 Document Revised: 04/06/2016 Document Reviewed: 01/22/2015 [employer]vier Interactive Patient Education  2018 [employer]. Pain Management Discharge Instructions  General Discharge Instructions :  If you need to reach your doctor call: Monday-Friday 8:00 am - 4:00 pm at [phone] or toll free [phone].  After clinic hours [phone] to have operator reach doctor.  Bring all of your medication bottles to all your appointments in the pain clinic.  To cancel or reschedule your appointment with Pain Management please remember to call 24 hours in advance to avoid a fee.  Refer to the educational materials which you have been given on: General Risks, I had my Procedure. Discharge Instructions, Post Sedation.  Post Procedure Instructions:  The drugs you were given will stay in your system until tomorrow, so for the next 24 hours you should not drive, make any legal decisions or drink any alcoholic beverages.  You may eat anything you prefer, but it is better to start with liquids then soups and crackers, and gradually work up to solid foods.  Please notify your doctor immediately if you have any unusual bleeding, trouble breathing or pain that is not related to your normal pain.  Depending on the type of procedure that was done, some parts of your body may feel week and/or numb.  This usually clears up by tonight or the next day.  Walk with  the use of an assistive device or accompanied by an adult for the 24 hours.  You may use ice on the affected area for the first 24 hours.  Put ice in a Ziploc bag and cover with a towel and place against area 15 minutes on 15 minutes off.  You may switch to heat after 24 hours.

## 2017-05-13 ENCOUNTER — Telehealth: Payer: Self-pay | Admitting: *Deleted

## 2017-05-13 NOTE — Telephone Encounter (Signed)
Voicemail left with patient to please call our office if she has questions or concerns re; the procedure on yesterday.

## 2017-05-26 ENCOUNTER — Ambulatory Visit: Payer: Medicare Other | Attending: Nurse Practitioner | Admitting: Nurse Practitioner

## 2017-05-26 ENCOUNTER — Encounter: Payer: Self-pay | Admitting: Nurse Practitioner

## 2017-05-26 VITALS — BP 153/91 | HR 61 | Temp 98.0°F | Resp 20 | Ht 65.0 in | Wt 235.0 lb

## 2017-05-26 DIAGNOSIS — Z7951 Long term (current) use of inhaled steroids: Secondary | ICD-10-CM | POA: Diagnosis not present

## 2017-05-26 DIAGNOSIS — M25512 Pain in left shoulder: Secondary | ICD-10-CM | POA: Insufficient documentation

## 2017-05-26 DIAGNOSIS — Z8249 Family history of ischemic heart disease and other diseases of the circulatory system: Secondary | ICD-10-CM | POA: Diagnosis not present

## 2017-05-26 DIAGNOSIS — M19011 Primary osteoarthritis, right shoulder: Secondary | ICD-10-CM | POA: Insufficient documentation

## 2017-05-26 DIAGNOSIS — Z7982 Long term (current) use of aspirin: Secondary | ICD-10-CM | POA: Diagnosis not present

## 2017-05-26 DIAGNOSIS — T402X5A Adverse effect of other opioids, initial encounter: Secondary | ICD-10-CM | POA: Insufficient documentation

## 2017-05-26 DIAGNOSIS — E785 Hyperlipidemia, unspecified: Secondary | ICD-10-CM | POA: Insufficient documentation

## 2017-05-26 DIAGNOSIS — M25511 Pain in right shoulder: Secondary | ICD-10-CM | POA: Insufficient documentation

## 2017-05-26 DIAGNOSIS — Z79891 Long term (current) use of opiate analgesic: Secondary | ICD-10-CM | POA: Insufficient documentation

## 2017-05-26 DIAGNOSIS — E669 Obesity, unspecified: Secondary | ICD-10-CM | POA: Diagnosis not present

## 2017-05-26 DIAGNOSIS — Z5181 Encounter for therapeutic drug level monitoring: Secondary | ICD-10-CM | POA: Insufficient documentation

## 2017-05-26 DIAGNOSIS — K5903 Drug induced constipation: Secondary | ICD-10-CM | POA: Diagnosis not present

## 2017-05-26 DIAGNOSIS — M5441 Lumbago with sciatica, right side: Secondary | ICD-10-CM

## 2017-05-26 DIAGNOSIS — M4722 Other spondylosis with radiculopathy, cervical region: Secondary | ICD-10-CM | POA: Insufficient documentation

## 2017-05-26 DIAGNOSIS — Z981 Arthrodesis status: Secondary | ICD-10-CM | POA: Insufficient documentation

## 2017-05-26 DIAGNOSIS — M4726 Other spondylosis with radiculopathy, lumbar region: Secondary | ICD-10-CM | POA: Diagnosis not present

## 2017-05-26 DIAGNOSIS — M48061 Spinal stenosis, lumbar region without neurogenic claudication: Secondary | ICD-10-CM | POA: Insufficient documentation

## 2017-05-26 DIAGNOSIS — M19012 Primary osteoarthritis, left shoulder: Secondary | ICD-10-CM | POA: Insufficient documentation

## 2017-05-26 DIAGNOSIS — M4802 Spinal stenosis, cervical region: Secondary | ICD-10-CM | POA: Diagnosis not present

## 2017-05-26 DIAGNOSIS — G8929 Other chronic pain: Secondary | ICD-10-CM

## 2017-05-26 DIAGNOSIS — M5442 Lumbago with sciatica, left side: Secondary | ICD-10-CM

## 2017-05-26 DIAGNOSIS — G894 Chronic pain syndrome: Secondary | ICD-10-CM | POA: Diagnosis not present

## 2017-05-26 DIAGNOSIS — R51 Headache: Secondary | ICD-10-CM | POA: Insufficient documentation

## 2017-05-26 DIAGNOSIS — Z79899 Other long term (current) drug therapy: Secondary | ICD-10-CM | POA: Diagnosis not present

## 2017-05-26 DIAGNOSIS — Z96611 Presence of right artificial shoulder joint: Secondary | ICD-10-CM | POA: Insufficient documentation

## 2017-05-26 DIAGNOSIS — E039 Hypothyroidism, unspecified: Secondary | ICD-10-CM | POA: Diagnosis not present

## 2017-05-26 DIAGNOSIS — M160 Bilateral primary osteoarthritis of hip: Secondary | ICD-10-CM | POA: Insufficient documentation

## 2017-05-26 DIAGNOSIS — Z6839 Body mass index (BMI) 39.0-39.9, adult: Secondary | ICD-10-CM | POA: Insufficient documentation

## 2017-05-26 DIAGNOSIS — M961 Postlaminectomy syndrome, not elsewhere classified: Secondary | ICD-10-CM | POA: Insufficient documentation

## 2017-05-26 DIAGNOSIS — G4730 Sleep apnea, unspecified: Secondary | ICD-10-CM | POA: Insufficient documentation

## 2017-05-26 DIAGNOSIS — M545 Low back pain: Secondary | ICD-10-CM | POA: Diagnosis not present

## 2017-05-26 DIAGNOSIS — M797 Fibromyalgia: Secondary | ICD-10-CM | POA: Insufficient documentation

## 2017-05-26 DIAGNOSIS — M542 Cervicalgia: Secondary | ICD-10-CM | POA: Insufficient documentation

## 2017-05-26 DIAGNOSIS — J439 Emphysema, unspecified: Secondary | ICD-10-CM | POA: Diagnosis not present

## 2017-05-26 NOTE — Patient Instructions (Addendum)
____________________________________________________________________________________________  Appointment Policy Summary  It is our goal and responsibility to provide the medical community with assistance in the evaluation and management of patients with chronic pain. Unfortunately our resources are limited. Because we do not have an unlimited amount of time, or available appointments, we are required to closely monitor and manage their use. The following rules exist to maximize their use:  Patient's responsibilities: 1. Punctuality:  At what time should I arrive? You should be physically present in our office 30 minutes before your scheduled appointment. Your scheduled appointment is with your assigned healthcare provider. However, it takes 5-10 minutes to be "checked-in", and another 15 minutes for the nurses to do the admission. If you arrive to our office at the time you were given for your appointment, you will end up being at least 20-25 minutes late to your appointment with the provider. 2. Tardiness:  What happens if I arrive only a few minutes after my scheduled appointment time? You will need to reschedule your appointment. The cutoff is your appointment time. This is why it is so important that you arrive at least 30 minutes before that appointment. If you have an appointment scheduled for 10:00 AM and you arrive at 10:01, you will be required to reschedule your appointment.  3. Plan ahead:  Always assume that you will encounter traffic on your way in. Plan for it. If you are dependent on a driver, make sure they understand these rules and the need to arrive early. 4. Other appointments and responsibilities:  Avoid scheduling any other appointments before or after your pain clinic appointments.  5. Be prepared:  Write down everything that you need to discuss with your healthcare provider and give this information to the admitting nurse. Write down the medications that you will need  refilled. Bring your pills and bottles (even the empty ones), to all of your appointments, except for those where a procedure is scheduled. 6. No children or pets:  Find someone to take care of them. It is not appropriate to bring them in. 7. Scheduling changes:  We request "advanced notification" of any changes or cancellations. 8. Advanced notification:  Defined as a time period of more than 24 hours prior to the originally scheduled appointment. This allows for the appointment to be offered to other patients. 9. Rescheduling:  When a visit is rescheduled, it will require the cancellation of the original appointment. For this reason they both fall within the category of "Cancellations".  10. Cancellations:  They require advanced notification. Any cancellation less than 24 hours before the  appointment will be recorded as a "No Show". 11. No Show:  Defined as an unkept appointment where the patient failed to notify or declare to the practice their intention or inability to keep the appointment.  Corrective process for repeat offenders:  1. Tardiness: Three (3) episodes of rescheduling due to late arrivals will be recorded as one (1) "No Show". 2. Cancellation or reschedule: Three (3) cancellations or rescheduling will be recorded as one (1) "No Show". 3. "No Shows": Three (3) "No Shows" within a 12 month period will result in discharge from the practice.  ____________________________________________________________________________________________   ____________________________________________________________________________________________  Medication Rules  Applies to: All patients receiving prescriptions (written or electronic).  Pharmacy of record: Pharmacy where electronic prescriptions will be sent. If written prescriptions are taken to a different pharmacy, please inform the nursing staff. The pharmacy listed in the electronic medical record should be the one where you would like  electronic prescriptions to be sent.  Prescription refills: Only during scheduled appointments. Applies to both, written and electronic prescriptions.  NOTE: The following applies primarily to controlled substances (Opioid* Pain Medications).   Patient's responsibilities: 1. Pain Pills: Bring all pain pills to every appointment (except for procedure appointments). 2. Pill Bottles: Bring pills in original pharmacy bottle. Always bring newest bottle. Bring bottle, even if empty. 3. Medication refills: You are responsible for knowing and keeping track of what medications you need refilled. The day before your appointment, write a list of all prescriptions that need to be refilled. Bring that list to your appointment and give it to the admitting nurse. Prescriptions will be written only during appointments. If you forget a medication, it will not be "Called in", "Faxed", or "electronically sent". You will need to get another appointment to get these prescribed. 4. Prescription Accuracy: You are responsible for carefully inspecting your prescriptions before leaving our office. Have the discharge nurse carefully go over each prescription with you, before taking them home. Make sure that your name is accurately spelled, that your address is correct. Check the name and dose of your medication to make sure it is accurate. Check the number of pills, and the written instructions to make sure they are clear and accurate. Make sure that you are given enough medication to last until your next medication refill appointment. 5. Taking Medication: Take medication as prescribed. Never take more pills than instructed. Never take medication more frequently than prescribed. Taking less pills or less frequently is permitted and encouraged, when it comes to controlled substances (written prescriptions).  6. Inform other Doctors: Always inform, all of your healthcare providers, of all the medications you take. 7. Pain  Medication from other Providers: You are not allowed to accept any additional pain medication from any other Doctor or Healthcare provider. There are two exceptions to this rule. (see below) In the event that you require additional pain medication, you are responsible for notifying us, as stated below. 8. Medication Agreement: You are responsible for carefully reading and following our Medication Agreement. This must be signed before receiving any prescriptions from our practice. Safely store a copy of your signed Agreement. Violations to the Agreement will result in no further prescriptions. (Additional copies of our Medication Agreement are available upon request.) 9. Laws, Rules, & Regulations: All patients are expected to follow all Federal and [employer], [employer], Rules, [employer]. Ignorance of the Laws does not constitute a valid excuse. The use of any illegal substances is prohibited. 10. Adopted CDC guidelines & recommendations: Target dosing levels will be at or below 60 MME/day. Use of benzodiazepines** is not recommended.  Exceptions: There are only two exceptions to the rule of not receiving pain medications from other Healthcare Providers. 1. Exception #1 (Emergencies): In the event of an emergency (i.e.: accident requiring emergency care), you are allowed to receive additional pain medication. However, you are responsible for: As soon as you are able, call our office [phone], at any time of the day or night, and leave a message stating your name, the date and nature of the emergency, and the name and dose of the medication prescribed. In the event that your call is answered by a member of our staff, make sure to document and save the date, time, and the name of the person that took your information.  2. Exception #2 (Planned Surgery): In the event that you are scheduled by another doctor or dentist to have any type  of surgery or procedure, you are allowed (for a period no longer than  30 days), to receive additional pain medication, for the acute post-op pain. However, in this case, you are responsible for picking up a copy of our "Post-op Pain Management for Surgeons" handout, and giving it to your surgeon or dentist. This document is available at our office, and does not require an appointment to obtain it. Simply go to our office during business hours (Monday-Thursday from 8:00 AM to 4:00 PM) (Friday 8:00 AM to 12:00 Noon) or if you have a scheduled appointment with [place], prior to your surgery, and ask for it by name. In addition, you will need to provide [place] with your name, name of your surgeon, type of surgery, and date of procedure or surgery.  *Opioid medications include: morphine, codeine, oxycodone, oxymorphone, hydrocodone, hydromorphone, meperidine, tramadol, tapentadol, buprenorphine, fentanyl, methadone. **Benzodiazepine medications include: diazepam (Valium), alprazolam (Xanax), clonazepam (Klonopine), lorazepam (Ativan), clorazepate (Tranxene), chlordiazepoxide (Librium), estazolam (Prosom), oxazepam (Serax), temazepam (Restoril), triazolam (Halcion)  ____________________________________________________________________________________________ ____________________________________________________________________________________________  Pain Scale  Introduction: The pain score used by this practice is the Verbal Numerical Rating Scale (VNRS-11). This is an 11-point scale. It is for adults and children 10 years or older. There are significant differences in how the pain score is reported, used, and applied. Forget everything you learned in the past and learn this scoring system.  General Information: The scale should reflect your current level of pain. Unless you are specifically asked for the level of your worst pain, or your average pain. If you are asked for one of these two, then it should be understood that it is over the past 24 hours.  Basic Activities of Daily  Living (ADL): Personal hygiene, dressing, eating, transferring, and using restroom.  Instructions: Most patients tend to report their level of pain as a combination of two factors, their physical pain and their psychosocial pain. This last one is also known as "suffering" and it is reflection of how physical pain affects you socially and psychologically. From now on, report them separately. From this point on, when asked to report your pain level, report only your physical pain. Use the following table for reference.  Pain Clinic Pain Levels (0-5/10)  Pain Level Score  Description  No Pain 0   Mild pain 1 Nagging, annoying, but does not interfere with basic activities of daily living (ADL). Patients are able to eat, bathe, get dressed, toileting (being able to get on and off the toilet and perform personal hygiene functions), transfer (move in and out of bed or a chair without assistance), and maintain continence (able to control bladder and bowel functions). Blood pressure and heart rate are unaffected. A normal heart rate for a healthy adult ranges from 60 to 100 bpm (beats per minute).   Mild to moderate pain 2 Noticeable and distracting. Impossible to hide from other people. More frequent flare-ups. Still possible to adapt and function close to normal. It can be very annoying and may have occasional stronger flare-ups. With discipline, patients may get used to it and adapt.   Moderate pain 3 Interferes significantly with activities of daily living (ADL). It becomes difficult to feed, bathe, get dressed, get on and off the toilet or to perform personal hygiene functions. Difficult to get in and out of bed or a chair without assistance. Very distracting. With effort, it can be ignored when deeply involved in activities.   Moderately severe pain 4 Impossible to ignore for more than a few  minutes. With effort, patients may still be able to manage work or participate in some social activities. Very  difficult to concentrate. Signs of autonomic nervous system discharge are evident: dilated pupils (mydriasis); mild sweating (diaphoresis); sleep interference. Heart rate becomes elevated (>115 bpm). Diastolic blood pressure (lower number) rises above 100 mmHg. Patients find relief in laying down and not moving.   Severe pain 5 Intense and extremely unpleasant. Associated with frowning face and frequent crying. Pain overwhelms the senses.  Ability to do any activity or maintain social relationships becomes significantly limited. Conversation becomes difficult. Pacing back and forth is common, as getting into a comfortable position is nearly impossible. Pain wakes you up from deep sleep. Physical signs will be obvious: pupillary dilation; increased sweating; goosebumps; brisk reflexes; cold, clammy hands and feet; nausea, vomiting or dry heaves; loss of appetite; significant sleep disturbance with inability to fall asleep or to remain asleep. When persistent, significant weight loss is observed due to the complete loss of appetite and sleep deprivation.  Blood pressure and heart rate becomes significantly elevated. Caution: If elevated blood pressure triggers a pounding headache associated with blurred vision, then the patient should immediately seek attention at an urgent or emergency care unit, as these may be signs of an impending stroke.    Emergency Department Pain Levels (6-10/10)  Emergency Room Pain 6 Severely limiting. Requires emergency care and should not be seen or managed at an outpatient pain management facility. Communication becomes difficult and requires great effort. Assistance to reach the emergency department may be required. Facial flushing and profuse sweating along with potentially dangerous increases in heart rate and blood pressure will be evident.   Distressing pain 7 Self-care is very difficult. Assistance is required to transport, or use restroom. Assistance to reach the  emergency department will be required. Tasks requiring coordination, such as bathing and getting dressed become very difficult.   Disabling pain 8 Self-care is no longer possible. At this level, pain is disabling. The individual is unable to do even the most "basic" activities such as walking, eating, bathing, dressing, transferring to a bed, or toileting. Fine motor skills are lost. It is difficult to think clearly.   Incapacitating pain 9 Pain becomes incapacitating. Thought processing is no longer possible. Difficult to remember your own name. Control of movement and coordination are lost.   The worst pain imaginable 10 At this level, most patients pass out from pain. When this level is reached, collapse of the autonomic nervous system occurs, leading to a sudden drop in blood pressure and heart rate. This in turn results in a temporary and dramatic drop in blood flow to the brain, leading to a loss of consciousness. Fainting is one of the body's self defense mechanisms. Passing out puts the brain in a calmed state and causes it to shut down for a while, in order to begin the healing process.    Summary: 1. Refer to this scale when providing [place] with your pain level. 2. Be accurate and careful when reporting your pain level. This will help with your care. 3. Over-reporting your pain level will lead to loss of credibility. 4. Even a level of 1/10 means that there is pain and will be treated at our facility. 5. High, inaccurate reporting will be documented as "Symptom Exaggeration", leading to loss of credibility and suspicions of possible secondary gains such as obtaining more narcotics, or wanting to appear disabled, for fraudulent reasons. 6. Only pain levels of 5 or below will  be seen at our facility. 7. Pain levels of 6 and above will be sent to the Emergency Department and the appointment  cancelled. ____________________________________________________________________________________________   BMI Assessment: Estimated body mass index is 39.11 kg/m as calculated from the following:   Height as of this encounter: 5\' 5"  (1.651 m).   Weight as of this encounter: 235 lb (106.6 kg).  BMI interpretation table: BMI level Category Range association with higher incidence of chronic pain  <18 kg/m2 Underweight   18.5-24.9 kg/m2 Ideal body weight   25-29.9 kg/m2 Overweight Increased incidence by 20%  30-34.9 kg/m2 Obese (Class I) Increased incidence by 68%  35-39.9 kg/m2 Severe obesity (Class II) Increased incidence by 136%  >40 kg/m2 Extreme obesity (Class III) Increased incidence by 254%   BMI Readings from Last 4 Encounters:  05/26/17 39.11 kg/m  05/12/17 39.27 kg/m  04/07/17 38.61 kg/m  03/24/17 37.61 kg/m   Wt Readings from Last 4 Encounters:  05/26/17 235 lb (106.6 kg)  05/12/17 236 lb (107 kg)  04/07/17 232 lb (105.2 kg)  03/24/17 226 lb (102.5 kg)  GENERAL RISKS AND COMPLICATIONS  What are the risk, side effects and possible complications? Generally speaking, most procedures are safe.  However, with any procedure there are risks, side effects, and the possibility of complications.  The risks and complications are dependent upon the sites that are lesioned, or the type of nerve block to be performed.  The closer the procedure is to the spine, the more serious the risks are.  Great care is taken when placing the radio frequency needles, block needles or lesioning probes, but sometimes complications can occur. 1. Infection: Any time there is an injection through the skin, there is a risk of infection.  This is why sterile conditions are used for these blocks.  There are four possible types of infection. 1. Localized skin infection. 2. Central Nervous System Infection-This can be in the form of Meningitis, which can be deadly. 3. Epidural Infections-This can be in the  form of an epidural abscess, which can cause pressure inside of the spine, causing compression of the spinal cord with subsequent paralysis. This would require an emergency surgery to decompress, and there are no guarantees that the patient would recover from the paralysis. 4. Discitis-This is an infection of the intervertebral discs.  It occurs in about 1% of discography procedures.  It is difficult to treat and it may lead to surgery.        2. Pain: the needles have to go through skin and soft tissues, will cause soreness.       3. Damage to internal structures:  The nerves to be lesioned may be near blood vessels or    other nerves which can be potentially damaged.       4. Bleeding: Bleeding is more common if the patient is taking blood thinners such as  aspirin, Coumadin, Ticiid, Plavix, etc., or if he/she have some genetic predisposition  such as hemophilia. Bleeding into the spinal canal can cause compression of the spinal  cord with subsequent paralysis.  This would require an emergency surgery to  decompress and there are no guarantees that the patient would recover from the  paralysis.       5. Pneumothorax:  Puncturing of a lung is a possibility, every time a needle is introduced in  the area of the chest or upper back.  Pneumothorax refers to free air around the  collapsed lung(s), inside of the thoracic cavity (chest cavity).  Another two possible  complications related to a similar event would include: Hemothorax and Chylothorax.   These are variations of the Pneumothorax, where instead of air around the collapsed  lung(s), you may have blood or chyle, respectively.       6. Spinal headaches: They may occur with any procedures in the area of the spine.       7. Persistent CSF (Cerebro-Spinal Fluid) leakage: This is a rare problem, but may occur  with prolonged intrathecal or epidural catheters either due to the formation of a fistulous  track or a dural tear.       8. Nerve damage: By  working so close to the spinal cord, there is always a possibility of  nerve damage, which could be as serious as a permanent spinal cord injury with  paralysis.       9. Death:  Although rare, severe deadly allergic reactions known as "Anaphylactic  reaction" can occur to any of the medications used.      10. Worsening of the symptoms:  We can always make thing worse.  What are the chances of something like this happening? Chances of any of this occuring are extremely low.  By statistics, you have more of a chance of getting killed in a motor vehicle accident: while driving to the hospital than any of the above occurring .  Nevertheless, you should be aware that they are possibilities.  In general, it is similar to taking a shower.  Everybody knows that you can slip, hit your head and get killed.  Does that mean that you should not shower again?  Nevertheless always keep in mind that statistics do not mean anything if you happen to be on the wrong side of them.  Even if a procedure has a 1 (one) in a 1,000,000 (million) chance of going wrong, it you happen to be that one..Also, keep in mind that by statistics, you have more of a chance of having something go wrong when taking medications.  Who should not have this procedure? If you are on a blood thinning medication (e.g. Coumadin, Plavix, see list of "Blood Thinners"), or if you have an active infection going on, you should not have the procedure.  If you are taking any blood thinners, please inform your physician.  How should I prepare for this procedure?  Do not eat or drink anything at least six hours prior to the procedure.  Bring a driver with you .  It cannot be a taxi.  Come accompanied by an adult that can drive you back, and that is strong enough to help you if your legs get weak or numb from the local anesthetic.  Take all of your medicines the morning of the procedure with just enough water to swallow them.  If you have diabetes,  make sure that you are scheduled to have your procedure done first thing in the morning, whenever possible.  If you have diabetes, take only half of your insulin dose and notify our nurse that you have done so as soon as you arrive at the clinic.  If you are diabetic, but only take blood sugar pills (oral hypoglycemic), then do not take them on the morning of your procedure.  You may take them after you have had the procedure.  Do not take aspirin or any aspirin-containing medications, at least eleven (11) days prior to the procedure.  They may prolong bleeding.  Wear loose fitting clothing that may be easy to  take off and that you would not mind if it got stained with Betadine or blood.  Do not wear any jewelry or perfume  Remove any nail coloring.  It will interfere with some of our monitoring equipment.  NOTE: Remember that this is not meant to be interpreted as a complete list of all possible complications.  Unforeseen problems may occur.  BLOOD THINNERS The following drugs contain aspirin or other products, which can cause increased bleeding during surgery and should not be taken for 2 weeks prior to and 1 week after surgery.  If you should need take something for relief of minor pain, you may take acetaminophen which is found in Tylenol,m Datril, Anacin-3 and Panadol. It is not blood thinner. The products listed below are.  Do not take any of the products listed below in addition to any listed on your instruction sheet.  A.P.C or A.P.C with Codeine Codeine Phosphate Capsules #3 Ibuprofen Ridaura  ABC compound Congesprin Imuran rimadil  Advil Cope Indocin Robaxisal  Alka-Seltzer Effervescent Pain Reliever and Antacid Coricidin or Coricidin-D  Indomethacin Rufen  Alka-Seltzer plus Cold Medicine Cosprin Ketoprofen S-A-C Tablets  Anacin Analgesic Tablets or Capsules Coumadin Korlgesic Salflex  Anacin Extra Strength Analgesic tablets or capsules CP-2 Tablets Lanoril Salicylate  Anaprox  Cuprimine Capsules Levenox Salocol  Anexsia-D Dalteparin Magan Salsalate  Anodynos Darvon compound Magnesium Salicylate Sine-off  Ansaid Dasin Capsules Magsal Sodium Salicylate  Anturane Depen Capsules Marnal Soma  APF Arthritis pain formula Dewitt's Pills Measurin Stanback  Argesic Dia-Gesic Meclofenamic Sulfinpyrazone  Arthritis Bayer Timed Release Aspirin Diclofenac Meclomen Sulindac  Arthritis pain formula Anacin Dicumarol Medipren Supac  Analgesic (Safety coated) Arthralgen Diffunasal Mefanamic Suprofen  Arthritis Strength Bufferin Dihydrocodeine Mepro Compound Suprol  Arthropan liquid Dopirydamole Methcarbomol with Aspirin Synalgos  ASA tablets/Enseals Disalcid Micrainin Tagament  Ascriptin Doan's Midol Talwin  Ascriptin A/D Dolene Mobidin Tanderil  Ascriptin Extra Strength Dolobid Moblgesic Ticlid  Ascriptin with Codeine Doloprin or Doloprin with Codeine Momentum Tolectin  Asperbuf Duoprin Mono-gesic Trendar  Aspergum Duradyne Motrin or Motrin IB Triminicin  Aspirin plain, buffered or enteric coated Durasal Myochrisine Trigesic  Aspirin Suppositories Easprin Nalfon Trillsate  Aspirin with Codeine Ecotrin Regular or Extra Strength Naprosyn Uracel  Atromid-S Efficin Naproxen Ursinus  Auranofin Capsules Elmiron Neocylate Vanquish  Axotal Emagrin Norgesic Verin  Azathioprine Empirin or Empirin with Codeine Normiflo Vitamin E  Azolid Emprazil Nuprin Voltaren  Bayer Aspirin plain, buffered or children's or timed BC Tablets or powders Encaprin Orgaran Warfarin Sodium  Buff-a-Comp Enoxaparin Orudis Zorpin  Buff-a-Comp with Codeine Equegesic Os-Cal-Gesic   Buffaprin Excedrin plain, buffered or Extra Strength Oxalid   Bufferin Arthritis Strength Feldene Oxphenbutazone   Bufferin plain or Extra Strength Feldene Capsules Oxycodone with Aspirin   Bufferin with Codeine Fenoprofen Fenoprofen Pabalate or Pabalate-SF   Buffets II Flogesic Panagesic   Buffinol plain or Extra Strength Florinal  or Florinal with Codeine Panwarfarin   Buf-Tabs Flurbiprofen Penicillamine   Butalbital Compound Four-way cold tablets Penicillin   Butazolidin Fragmin Pepto-Bismol   Carbenicillin Geminisyn Percodan   Carna Arthritis Reliever Geopen Persantine   Carprofen Gold's salt Persistin   Chloramphenicol Goody's Phenylbutazone   Chloromycetin Haltrain Piroxlcam   Clmetidine heparin Plaquenil   Cllnoril Hyco-pap Ponstel   Clofibrate Hydroxy chloroquine Propoxyphen         Before stopping any of these medications, be sure to consult the physician who ordered them.  Some, such as Coumadin (Warfarin) are ordered to prevent or treat serious conditions such as "deep thrombosis", "pumonary  embolisms", and other heart problems.  The amount of time that you may need off of the medication may also vary with the medication and the reason for which you were taking it.  If you are taking any of these medications, please make sure you notify your pain physician before you undergo any procedures.

## 2017-05-26 NOTE — Progress Notes (Signed)
Patient's Name: Regina Campos  MRN: [id number]  Referring Provider: Lenard Simmer, MD  DOB: Feb 28, 1951  PCP: Lenard Simmer, MD  DOS: 05/26/2017  Note by: Vevelyn Francois NP  Service setting: Ambulatory outpatient  Specialty: Interventional Pain Management  Location: [facility] (AMB) Pain Management Facility    Patient type: Established    Primary Reason(s) for Visit: Encounter for prescription drug management & post-procedure evaluation of chronic illness with mild to moderate exacerbation(Level of risk: moderate) CC: Neck Pain (mid); Shoulder Pain (bilateral ); and Ankle Pain (left)  HPI  Regina Campos is a 66 y.o. year old, female patient, who comes today for a post-procedure evaluation and medication management. She has Encounter for therapeutic drug level monitoring; Long term current use of opiate analgesic; Uncomplicated opioid dependence ([place]); Opiate use (40 MME/Day); Chronic neck pain (Location of Primary Source of Pain); Chronic low back pain (Location of Secondary source of pain); Lumbar radicular pain; Lumbar spondylosis with radicular symptoms; Chronic pain syndrome; Pulmonary emphysema ([place]); Atypical migraine; Apnea, sleep; High cholesterol; Hypothyroidism; History of cervical spinal surgery; Cervical foraminal stenosis; Cervicogenic headache; Fibromyalgia; History of cardiac arrhythmia; Depression; Obesity; 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); Cervical central spinal stenosis; Long term prescription opiate use; Myofascial pain; Cervical paraspinal muscle spasm; Cervical spondylosis with radiculopathy (Right side); 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); Opioid-induced constipation (OIC); 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 Her primarily concern today is the Neck Pain (mid); Shoulder Pain (bilateral ); and Ankle Pain (left)  Pain Assessment: Location: Mid Neck Radiating: back of head Onset: More than a month ago Duration: Chronic pain Quality: Aching, Constant, Headache, Discomfort, Nagging Severity: 6 /10 (self-reported pain score)  Note: Reported level is compatible with observation. Clinically the patient looks like a 2/10 Information on the proper use of the pain scale provided to the patient today.  Effect on ADL: pace self Timing: Constant Modifying factors: medications  Regina Campos was last seen on 03/24/2017 for a procedure. During today's appointment we reviewed Regina Campos's post-procedure results, as well as her outpatient medication regimen. She admits that she was cleaning her bedroom for a long period. She is not longer having the relief that she had a week after the injection. She would like to have another injection.   Further details on both, my assessment(s), as well as the proposed treatment plan, please see below.  Controlled Substance Pharmacotherapy Assessment REMS (Risk Evaluation and Mitigation Strategy)  Analgesic:Tramadol 50 mg 2 tablets every 6 hours (400 mg/day) MME/day:40 mg/day  No notes on file Pharmacokinetics: Liberation and absorption (onset of action): WNL Distribution (time to peak effect): WNL Metabolism and excretion (duration of action): WNL         Pharmacodynamics: Desired effects: Analgesia: Regina Campos reports >50% benefit. Functional ability: Patient reports that medication allows her to accomplish basic ADLs Clinically  meaningful improvement in function (CMIF): Sustained CMIF goals met Perceived effectiveness: Described as relatively effective, allowing for increase in activities of daily living (ADL) Undesirable effects: Side-effects or Adverse reactions: None reported Monitoring: [place] P[place]: Online review of the  past 61-monthperiod conducted. Compliant with practice rules and regulations Last UDS on record: Summary  Date Value Ref Range Status  03/24/2017 FINAL  Final    Comment:    ==================================================================== TOXASSURE SELECT 13 (MW) ==================================================================== Test                             Result       Flag       Units Drug Present and Declared for Prescription Verification   Tramadol                       PRESENT      EXPECTED   O-Desmethyltramadol            PRESENT      EXPECTED   N-Desmethyltramadol            PRESENT      EXPECTED    Source of tramadol is a prescription medication.    O-desmethyltramadol and N-desmethyltramadol are expected    metabolites of tramadol. ==================================================================== Test                      Result    Flag   Units      Ref Range   Creatinine              131              mg/dL      >=20 ==================================================================== Declared Medications:  The flagging and interpretation on this report are based on the  following declared medications.  Unexpected results may arise from  inaccuracies in the declared medications.  **Note: The testing scope of this panel includes these medications:  Tramadol  **Note: The testing scope of this panel does not include following  reported medications:  Albuterol  Aspirin (Aspirin 81)  Budenoside (Symbicort)  Docusate  Fenofibrate  Formoterol (Symbicort)  Levothyroxine  Lubiprostone  Meloxicam  Montelukast  Multivitamin  Naloxone  Nitroglycerin  Rosuvastatin   Tiotropium (Spiriva) ==================================================================== For clinical consultation, please call ([phone] ====================================================================    UDS interpretation: Compliant          Medication Assessment Form: Reviewed. Patient indicates being compliant with therapy Treatment compliance: Compliant Risk Assessment Profile: Aberrant behavior: See prior evaluations. None observed or detected today Comorbid factors increasing risk of overdose: See prior notes. No additional risks detected today Risk of substance use disorder (SUD): Low     Opioid Risk Tool - 05/26/17 1135      Family History of Substance Abuse   Alcohol Negative   Illegal Drugs Negative   Rx Drugs Negative     Personal History of Substance Abuse   Alcohol Positive Female or Female   Illegal Drugs Negative   Rx Drugs Negative     History of Preadolescent Sexual Abuse   History of Preadolescent Sexual Abuse Negative or Female     Psychological Disease   Psychological Disease Negative   Depression Negative     Total Score   Opioid Risk Tool Scoring 3   Opioid Risk Interpretation Low Risk     ORT Scoring interpretation table:  Score <3 = Low Risk for SUD  Score between 4-7 = Moderate Risk for SUD  Score >8 = High Risk for Opioid Abuse   Risk Mitigation Strategies:  Patient Counseling: Covered Patient-Prescriber Agreement (PPA): Present and active  Notification to other healthcare providers: Done  Pharmacologic Plan: No change in therapy, at this time  Post-Procedure Assessment  03/24/2017 Procedure: 05/12/17 Pre-procedure pain score:  7/10 Post-procedure pain score: 0/10         Influential Factors: BMI: 39.11 kg/m Intra-procedural challenges: None observed.         Assessment challenges: None detected.              Reported side-effects: None.        Post-procedural adverse reactions or complications: None reported          Sedation: Please see nurses note. When no sedatives are used, the analgesic levels obtained are directly associated to the effectiveness of the local anesthetics. However, when sedation is provided, the level of analgesia obtained during the initial 1 hour following the intervention, is believed to be the result of a combination of factors. These factors may include, but are not limited to: 1. The effectiveness of the local anesthetics used. 2. The effects of the analgesic(s) and/or anxiolytic(s) used. 3. The degree of discomfort experienced by the patient at the time of the procedure. 4. The patients ability and reliability in recalling and recording the events. 5. The presence and influence of possible secondary gains and/or psychosocial factors. Reported result: Relief experienced during the 1st hour after the procedure: 100 % (Ultra-Short Term Relief)            Interpretative annotation: Clinically appropriate result. Analgesia during this period is likely to be Local Anesthetic and/or IV Sedative (Analgesic/Anxiolytic) related.          Effects of local anesthetic: The analgesic effects attained during this period are directly associated to the localized infiltration of local anesthetics and therefore cary significant diagnostic value as to the etiological location, or anatomical origin, of the pain. Expected duration of relief is directly dependent on the pharmacodynamics of the local anesthetic used. Long-acting (4-6 hours) anesthetics used.  Reported result: Relief during the next 4 to 6 hour after the procedure: 100 % (Short-Term Relief)            Interpretative annotation: Clinically appropriate result. Analgesia during this period is likely to be Local Anesthetic-related.          Long-term benefit: Defined as the period of time past the expected duration of local anesthetics (1 hour for short-acting and 4-6 hours for long-acting). With the possible exception of prolonged sympathetic  blockade from the local anesthetics, benefits during this period are typically attributed to, or associated with, other factors such as analgesic sensory neuropraxia, antiinflammatory effects, or beneficial biochemical changes provided by agents other than the local anesthetics.  Reported result: Extended relief following procedure: 50 % (Long-Term Relief)            Interpretative annotation: Clinically appropriate result. Good relief. No permanent benefit expected. Inflammation plays a part in the etiology to the pain.          Current benefits: Defined as reported results that persistent at this point in time.   Analgesia: <50 % Ms. Tedesco reports improvement of arthralgia. Function: Somewhat improved ROM: Somewhat improved Interpretative annotation: Recurrence of symptoms. No permanent benefit expected. Effective diagnostic intervention. Benefit could be steroid-related.  Interpretation: Results would suggest failure of therapy in achieving desired goal(s). Possible adjustment in plan of care may be required          Plan:  Please see "Plan of Care" for details.       Laboratory Chemistry  Inflammation Markers (CRP: Acute Phase) (ESR: Chronic  Phase) Lab Results  Component Value Date   ESRSEDRATE 11 12/06/2012                 Renal Function Markers No results found for: BUN, CREATININE, GFRAA, GFRNONAA               Hepatic Function Markers No results found for: AST, ALT, ALBUMIN, ALKPHOS, HCVAB               Electrolytes No results found for: NA, K, CL, CALCIUM, MG               Neuropathy Markers No results found for: [id number]               Bone Pathology Markers No results found for: Hendricks Milo, VD125OH2TOT, TX7741SE3, TR3202BX4, 25OHVITD1, 25OHVITD2, 25OHVITD3, CALCIUM, TESTOFREE, TESTOSTERONE               Coagulation Parameters No results found for: INR, LABPROT, APTT, PLT               Cardiovascular Markers No results found for: BNP, HGB, HCT                Note: Lab results reviewed.  Recent Diagnostic Imaging Results  DG C-Arm 1-60 Min-No Report Fluoroscopy was utilized by the requesting physician.  No radiographic  interpretation.   Complexity Note: Imaging results reviewed. Results shared with Ms. Weatherman, using Layman's terms.                         Meds   Current Outpatient Prescriptions:  .  albuterol (PROVENTIL HFA;VENTOLIN HFA) 108 (90 Base) MCG/ACT inhaler, Inhale into the lungs., Disp: , Rfl:  .  aspirin 81 MG tablet, Take 81 mg by mouth daily., Disp: , Rfl:  .  budesonide-formoterol (SYMBICORT) 160-4.5 MCG/ACT inhaler, Inhale 2 puffs into the lungs 2 (two) times daily., Disp: , Rfl:  .  fenofibrate (TRICOR) 145 MG tablet, Take 145 mg by mouth daily., Disp: , Rfl:  .  levothyroxine (SYNTHROID, LEVOTHROID) 125 MCG tablet, Take 125 mcg by mouth daily before breakfast., Disp: , Rfl:  .  naloxone (NARCAN) 0.4 MG/ML injection, Inject 1 mL (0.4 mg total) into the muscle as needed (for pain medication overdose.). Inject into thigh muscle, then call 911., Disp: 2 mL, Rfl: 0 .  nitroGLYCERIN (NITROSTAT) 0.4 MG SL tablet, TAKE ONE TABLET SUBLINGUALLY EVERY 5 MINUTES X 3 FOR CHEST PAIN, Disp: , Rfl: 0 .  rosuvastatin (CRESTOR) 10 MG tablet, Take 10 mg by mouth daily., Disp: , Rfl:  .  tiotropium (SPIRIVA) 18 MCG inhalation capsule, Place into inhaler and inhale., Disp: , Rfl:  .  topiramate (TOPAMAX) 100 MG tablet, Take 100 mg by mouth 2 (two) times daily., Disp: , Rfl:  .  traMADol (ULTRAM) 50 MG tablet, Take 2 tablets (100 mg total) by mouth every 6 (six) hours as needed for moderate pain or severe pain., Disp: 720 tablet, Rfl: 1 .  docusate sodium (COLACE) 100 MG capsule, Take 2 capsules (200 mg total) by mouth at bedtime as needed for moderate constipation. (Patient not taking: Reported on 05/26/2017), Disp: 180 capsule, Rfl: 0 .  lubiprostone (AMITIZA) 24 MCG capsule, Take 24 mcg by mouth 2 (two) times daily with a meal., Disp: , Rfl:   .  montelukast (SINGULAIR) 10 MG tablet, Take by mouth., Disp: , Rfl:   ROS  Constitutional: Denies any fever or chills Gastrointestinal: No reported hemesis, hematochezia,  vomiting, or acute GI distress Musculoskeletal: Denies any acute onset joint swelling, redness, loss of ROM, or weakness Neurological: No reported episodes of acute onset apraxia, aphasia, dysarthria, agnosia, amnesia, paralysis, loss of coordination, or loss of consciousness  Allergies  Ms. Finks has No Known Allergies.  [place]  Drug: Ms. Meloy  reports that she does not use drugs. Alcohol:  reports that she does not drink alcohol. Tobacco:  reports that she has never smoked. She has never used smokeless tobacco. Medical:  has a past medical history of Arthritis; Displacement of lumbar intervertebral disc (06/04/2015); History of cardiac arrhythmia (06/04/2015); History of cervical spinal surgery (06/04/2015); Hyperlipidemia; Hypothyroidism (06/04/2015); and Thyroid disease. Surgical: Ms. Sturgeon  has a past surgical history that includes Abdominal hysterectomy; Total shoulder replacement; Replacement total knee; Appendectomy; Lumbar fusion; Carpal tunnel release; and Back surgery (07/25/2016). Family: family history includes Cancer in her mother; Heart disease in her father.  Constitutional Exam  General appearance: Well nourished, well developed, and well hydrated. In no apparent acute distress Vitals:   05/26/17 1126  BP: (!) 153/91  Pulse: 61  Resp: 20  Temp: 98 F (36.7 C)  SpO2: 98%  Weight: 235 lb (106.6 kg)  Height: '5\' 5"'  (1.651 m)  Psych/Mental status: Alert, oriented x 3 (person, place, & time)       Eyes: PERLA Respiratory: No evidence of acute respiratory distress  Cervical Spine Area Exam  Skin & Axial Inspection: No masses, redness, edema, swelling, or associated skin lesions Alignment: Symmetrical Functional ROM: Unrestricted ROM      Stability: No instability detected Muscle  Tone/Strength: Functionally intact. No obvious neuro-muscular anomalies detected. Sensory (Neurological): Unimpaired Palpation: No palpable anomalies              Upper Extremity (UE) Exam    Side: Right upper extremity  Side: Left upper extremity  Skin & Extremity Inspection: Skin color, temperature, and hair growth are WNL. No peripheral edema or cyanosis. No masses, redness, swelling, asymmetry, or associated skin lesions. No contractures.  Skin & Extremity Inspection: Skin color, temperature, and hair growth are WNL. No peripheral edema or cyanosis. No masses, redness, swelling, asymmetry, or associated skin lesions. No contractures.  Functional ROM: Unrestricted ROM          Functional ROM: Unrestricted ROM          Muscle Tone/Strength: Functionally intact. No obvious neuro-muscular anomalies detected.  Muscle Tone/Strength: Functionally intact. No obvious neuro-muscular anomalies detected.  Sensory (Neurological): Unimpaired          Sensory (Neurological): Unimpaired          Palpation: No palpable anomalies              Palpation: No palpable anomalies              Specialized Test(s): Deferred         Specialized Test(s): Deferred          Thoracic Spine Area Exam  Skin & Axial Inspection: No masses, redness, or swelling Alignment: Symmetrical Functional ROM: Unrestricted ROM Stability: No instability detected Muscle Tone/Strength: Functionally intact. No obvious neuro-muscular anomalies detected. Sensory (Neurological): Unimpaired Muscle strength & Tone: No palpable anomalies  Lumbar Spine Area Exam  Skin & Axial Inspection: No masses, redness, or swelling Alignment: Symmetrical Functional ROM: Unrestricted ROM      Stability: No instability detected Muscle Tone/Strength: Functionally intact. No obvious neuro-muscular anomalies detected. Sensory (Neurological): Unimpaired Palpation: No palpable anomalies       Provocative  Tests: Lumbar Hyperextension and rotation test:  evaluation deferred today       Lumbar Lateral bending test: evaluation deferred today       Patrick's Maneuver: evaluation deferred today                    Gait & Posture Assessment  Ambulation: Unassisted Gait: Relatively normal for age and body habitus Posture: WNL   Lower Extremity Exam    Side: Right lower extremity  Side: Left lower extremity  Skin & Extremity Inspection: Skin color, temperature, and hair growth are WNL. No peripheral edema or cyanosis. No masses, redness, swelling, asymmetry, or associated skin lesions. No contractures.  Skin & Extremity Inspection: Skin color, temperature, and hair growth are WNL. No peripheral edema or cyanosis. No masses, redness, swelling, asymmetry, or associated skin lesions. No contractures.  Functional ROM: Unrestricted ROM          Functional ROM: Unrestricted ROM          Muscle Tone/Strength: Functionally intact. No obvious neuro-muscular anomalies detected.  Muscle Tone/Strength: Functionally intact. No obvious neuro-muscular anomalies detected.  Sensory (Neurological): Unimpaired  Sensory (Neurological): Unimpaired  Palpation: No palpable anomalies  Palpation: No palpable anomalies   Assessment  Primary Diagnosis & Pertinent Problem List: The primary encounter diagnosis was Chronic neck pain (Location of Primary Source of Pain). Diagnoses of Chronic shoulder pain (Location of Tertiary source of pain) (Bilateral) (L>R), Chronic low back pain (Location of Secondary source of pain), and Chronic pain syndrome were also pertinent to this visit.  Status Diagnosis  Persistent Controlled Controlled 1. Chronic neck pain (Location of Primary Source of Pain)   2. Chronic shoulder pain (Location of Tertiary source of pain) (Bilateral) (L>R)   3. Chronic low back pain (Location of Secondary source of pain)   4. Chronic pain syndrome     Problems updated and reviewed during this visit: No problems updated. Plan of Care  Pharmacotherapy  (Medications Ordered): No orders of the defined types were placed in this encounter.  New Prescriptions   No medications on file   Medications administered today: Ms. Eppard had no medications administered during this visit. Lab-work, procedure(s), and/or referral(s): No orders of the defined types were placed in this encounter.  Imaging and/or referral(s): None  Interventional therapies: Planned, scheduled, and/or pending:   Left Suprascapular nerve block   Considering:   Diagnostic bilateral suprascapular NB Possiblebilateral suprascapular RFA Diagnostic Left intra-articular shoulder joint injection Bilateral intra-articular Hip injection Palliative right-sided L3-4 lumbar epiduralsteroid injection Palliative right-sided L4-5 transforaminal epidural steroid injection Palliative right-sided L2-3 lumbar epiduralsteroid injection Palliative right-sided C7-T1 cervical epiduralsteroid injection Diagnostic bilateral cervical facet block Possible bilateral cervical facet radiofrequencyablation Diagnostic bilateral lumbar facet block Possible bilateral lumbar facet radiofrequencyablation   Palliative PRN treatment(s):   Palliative right-sided L3-4 lumbar epiduralsteroid injection Palliative right-sided L4-5 transforaminal epiduralsteroid injection Palliative right-sided L2-3 lumbar epiduralsteroid injection Palliative right-sided C7-T1 cervical epiduralsteroid injection Palliativebilateral shoulder joint injection Palliativebilateral suprascapular nerve block   Provider-requested follow-up: Return in about 3 months (around 08/26/2017) for MedMgmt.  Future Appointments Date Time Provider [place]  10/05/2017 9:00 AM Vevelyn Francois, NP [facility] None   Primary Care Physician: Lenard Simmer, MD Location: [facility] Out[facility] Management Facility Note by: Vevelyn Francois NP Date: 05/26/2017; Time: 11:49 AM  Pain Score Disclaimer: We use the NRS-11  scale. This is a self-reported, subjective measurement of pain severity with only modest accuracy. It is used primarily to identify changes within a  particular patient. It must be understood that outpatient pain scales are significantly less accurate that those used for research, where they can be applied under ideal controlled circumstances with minimal exposure to variables. In reality, the score is likely to be a combination of pain intensity and pain affect, where pain affect describes the degree of emotional arousal or changes in action readiness caused by the sensory experience of pain. Factors such as social and work situation, setting, emotional state, anxiety levels, expectation, and prior pain experience may influence pain perception and show large inter-individual differences that may also be affected by time variables.  Patient instructions provided during this appointment: Patient Instructions    ____________________________________________________________________________________________  Appointment Policy Summary  It is our goal and responsibility to provide the medical community with assistance in the evaluation and management of patients with chronic pain. Unfortunately our resources are limited. Because we do not have an unlimited amount of time, or available appointments, we are required to closely monitor and manage their use. The following rules exist to maximize their use:  Patient's responsibilities: 1. Punctuality:  At what time should I arrive? You should be physically present in our office 30 minutes before your scheduled appointment. Your scheduled appointment is with your assigned healthcare provider. However, it takes 5-10 minutes to be "checked-in", and another 15 minutes for the nurses to do the admission. If you arrive to our office at the time you were given for your appointment, you will end up being at least 20-25 minutes late to your appointment with the  provider. 2. Tardiness:  What happens if I arrive only a few minutes after my scheduled appointment time? You will need to reschedule your appointment. The cutoff is your appointment time. This is why it is so important that you arrive at least 30 minutes before that appointment. If you have an appointment scheduled for 10:00 AM and you arrive at 10:01, you will be required to reschedule your appointment.  3. Plan ahead:  Always assume that you will encounter traffic on your way in. Plan for it. If you are dependent on a driver, make sure they understand these rules and the need to arrive early. 4. Other appointments and responsibilities:  Avoid scheduling any other appointments before or after your pain clinic appointments.  5. Be prepared:  Write down everything that you need to discuss with your healthcare provider and give this information to the admitting nurse. Write down the medications that you will need refilled. Bring your pills and bottles (even the empty ones), to all of your appointments, except for those where a procedure is scheduled. 6. No children or pets:  Find someone to take care of them. It is not appropriate to bring them in. 7. Scheduling changes:  We request "advanced notification" of any changes or cancellations. 8. Advanced notification:  Defined as a time period of more than 24 hours prior to the originally scheduled appointment. This allows for the appointment to be offered to other patients. 9. Rescheduling:  When a visit is rescheduled, it will require the cancellation of the original appointment. For this reason they both fall within the category of "Cancellations".  10. Cancellations:  They require advanced notification. Any cancellation less than 24 hours before the  appointment will be recorded as a "No Show". 11. No Show:  Defined as an unkept appointment where the patient failed to notify or declare to the practice their intention or inability to keep the  appointment.  Corrective process for repeat  offenders:  1. Tardiness: Three (3) episodes of rescheduling due to late arrivals will be recorded as one (1) "No Show". 2. Cancellation or reschedule: Three (3) cancellations or rescheduling will be recorded as one (1) "No Show". 3. "No Shows": Three (3) "No Shows" within a 12 month period will result in discharge from the practice.  ____________________________________________________________________________________________   ____________________________________________________________________________________________  Medication Rules  Applies to: All patients receiving prescriptions (written or electronic).  Pharmacy of record: Pharmacy where electronic prescriptions will be sent. If written prescriptions are taken to a different pharmacy, please inform the nursing staff. The pharmacy listed in the electronic medical record should be the one where you would like electronic prescriptions to be sent.  Prescription refills: Only during scheduled appointments. Applies to both, written and electronic prescriptions.  NOTE: The following applies primarily to controlled substances (Opioid* Pain Medications).   Patient's responsibilities: 1. Pain Pills: Bring all pain pills to every appointment (except for procedure appointments). 2. Pill Bottles: Bring pills in original pharmacy bottle. Always bring newest bottle. Bring bottle, even if empty. 3. Medication refills: You are responsible for knowing and keeping track of what medications you need refilled. The day before your appointment, write a list of all prescriptions that need to be refilled. Bring that list to your appointment and give it to the admitting nurse. Prescriptions will be written only during appointments. If you forget a medication, it will not be "Called in", "Faxed", or "electronically sent". You will need to get another appointment to get these prescribed. 4. Prescription Accuracy: You  are responsible for carefully inspecting your prescriptions before leaving our office. Have the discharge nurse carefully go over each prescription with you, before taking them home. Make sure that your name is accurately spelled, that your address is correct. Check the name and dose of your medication to make sure it is accurate. Check the number of pills, and the written instructions to make sure they are clear and accurate. Make sure that you are given enough medication to last until your next medication refill appointment. 5. Taking Medication: Take medication as prescribed. Never take more pills than instructed. Never take medication more frequently than prescribed. Taking less pills or less frequently is permitted and encouraged, when it comes to controlled substances (written prescriptions).  6. Inform other Doctors: Always inform, all of your healthcare providers, of all the medications you take. 7. Pain Medication from other Providers: You are not allowed to accept any additional pain medication from any other Doctor or Healthcare provider. There are two exceptions to this rule. (see below) In the event that you require additional pain medication, you are responsible for notifying us, as stated below. 8. Medication Agreement: You are responsible for carefully reading and following our Medication Agreement. This must be signed before receiving any prescriptions from our practice. Safely store a copy of your signed Agreement. Violations to the Agreement will result in no further prescriptions. (Additional copies of our Medication Agreement are available upon request.) 9. Laws, Rules, & Regulations: All patients are expected to follow all Federal and [employer], [employer], Rules, [employer]. Ignorance of the Laws does not constitute a valid excuse. The use of any illegal substances is prohibited. 10. Adopted CDC guidelines & recommendations: Target dosing levels will be at or below 60 MME/day. Use of  benzodiazepines** is not recommended.  Exceptions: There are only two exceptions to the rule of not receiving pain medications from other Healthcare Providers. 1. Exception #1 (Emergencies): In the event of  an emergency (i.e.: accident requiring emergency care), you are allowed to receive additional pain medication. However, you are responsible for: As soon as you are able, call our office [phone], at any time of the day or night, and leave a message stating your name, the date and nature of the emergency, and the name and dose of the medication prescribed. In the event that your call is answered by a member of our staff, make sure to document and save the date, time, and the name of the person that took your information.  2. Exception #2 (Planned Surgery): In the event that you are scheduled by another doctor or dentist to have any type of surgery or procedure, you are allowed (for a period no longer than 30 days), to receive additional pain medication, for the acute post-op pain. However, in this case, you are responsible for picking up a copy of our "Post-op Pain Management for Surgeons" handout, and giving it to your surgeon or dentist. This document is available at our office, and does not require an appointment to obtain it. Simply go to our office during business hours (Monday-Thursday from 8:00 AM to 4:00 PM) (Friday 8:00 AM to 12:00 Noon) or if you have a scheduled appointment with [place], prior to your surgery, and ask for it by name. In addition, you will need to provide [place] with your name, name of your surgeon, type of surgery, and date of procedure or surgery.  *Opioid medications include: morphine, codeine, oxycodone, oxymorphone, hydrocodone, hydromorphone, meperidine, tramadol, tapentadol, buprenorphine, fentanyl, methadone. **Benzodiazepine medications include: diazepam (Valium), alprazolam (Xanax), clonazepam (Klonopine), lorazepam (Ativan), clorazepate (Tranxene), chlordiazepoxide  (Librium), estazolam (Prosom), oxazepam (Serax), temazepam (Restoril), triazolam (Halcion)  ____________________________________________________________________________________________ ____________________________________________________________________________________________  Pain Scale  Introduction: The pain score used by this practice is the Verbal Numerical Rating Scale (VNRS-11). This is an 11-point scale. It is for adults and children 10 years or older. There are significant differences in how the pain score is reported, used, and applied. Forget everything you learned in the past and learn this scoring system.  General Information: The scale should reflect your current level of pain. Unless you are specifically asked for the level of your worst pain, or your average pain. If you are asked for one of these two, then it should be understood that it is over the past 24 hours.  Basic Activities of Daily Living (ADL): Personal hygiene, dressing, eating, transferring, and using restroom.  Instructions: Most patients tend to report their level of pain as a combination of two factors, their physical pain and their psychosocial pain. This last one is also known as "suffering" and it is reflection of how physical pain affects you socially and psychologically. From now on, report them separately. From this point on, when asked to report your pain level, report only your physical pain. Use the following table for reference.  Pain Clinic Pain Levels (0-5/10)  Pain Level Score  Description  No Pain 0   Mild pain 1 Nagging, annoying, but does not interfere with basic activities of daily living (ADL). Patients are able to eat, bathe, get dressed, toileting (being able to get on and off the toilet and perform personal hygiene functions), transfer (move in and out of bed or a chair without assistance), and maintain continence (able to control bladder and bowel functions). Blood pressure and heart rate are  unaffected. A normal heart rate for a healthy adult ranges from 60 to 100 bpm (beats per minute).   Mild to moderate  pain 2 Noticeable and distracting. Impossible to hide from other people. More frequent flare-ups. Still possible to adapt and function close to normal. It can be very annoying and may have occasional stronger flare-ups. With discipline, patients may get used to it and adapt.   Moderate pain 3 Interferes significantly with activities of daily living (ADL). It becomes difficult to feed, bathe, get dressed, get on and off the toilet or to perform personal hygiene functions. Difficult to get in and out of bed or a chair without assistance. Very distracting. With effort, it can be ignored when deeply involved in activities.   Moderately severe pain 4 Impossible to ignore for more than a few minutes. With effort, patients may still be able to manage work or participate in some social activities. Very difficult to concentrate. Signs of autonomic nervous system discharge are evident: dilated pupils (mydriasis); mild sweating (diaphoresis); sleep interference. Heart rate becomes elevated (>115 bpm). Diastolic blood pressure (lower number) rises above 100 mmHg. Patients find relief in laying down and not moving.   Severe pain 5 Intense and extremely unpleasant. Associated with frowning face and frequent crying. Pain overwhelms the senses.  Ability to do any activity or maintain social relationships becomes significantly limited. Conversation becomes difficult. Pacing back and forth is common, as getting into a comfortable position is nearly impossible. Pain wakes you up from deep sleep. Physical signs will be obvious: pupillary dilation; increased sweating; goosebumps; brisk reflexes; cold, clammy hands and feet; nausea, vomiting or dry heaves; loss of appetite; significant sleep disturbance with inability to fall asleep or to remain asleep. When persistent, significant weight loss is observed due to  the complete loss of appetite and sleep deprivation.  Blood pressure and heart rate becomes significantly elevated. Caution: If elevated blood pressure triggers a pounding headache associated with blurred vision, then the patient should immediately seek attention at an urgent or emergency care unit, as these may be signs of an impending stroke.    Emergency Department Pain Levels (6-10/10)  Emergency Room Pain 6 Severely limiting. Requires emergency care and should not be seen or managed at an outpatient pain management facility. Communication becomes difficult and requires great effort. Assistance to reach the emergency department may be required. Facial flushing and profuse sweating along with potentially dangerous increases in heart rate and blood pressure will be evident.   Distressing pain 7 Self-care is very difficult. Assistance is required to transport, or use restroom. Assistance to reach the emergency department will be required. Tasks requiring coordination, such as bathing and getting dressed become very difficult.   Disabling pain 8 Self-care is no longer possible. At this level, pain is disabling. The individual is unable to do even the most "basic" activities such as walking, eating, bathing, dressing, transferring to a bed, or toileting. Fine motor skills are lost. It is difficult to think clearly.   Incapacitating pain 9 Pain becomes incapacitating. Thought processing is no longer possible. Difficult to remember your own name. Control of movement and coordination are lost.   The worst pain imaginable 10 At this level, most patients pass out from pain. When this level is reached, collapse of the autonomic nervous system occurs, leading to a sudden drop in blood pressure and heart rate. This in turn results in a temporary and dramatic drop in blood flow to the brain, leading to a loss of consciousness. Fainting is one of the body's self defense mechanisms. Passing out puts the brain in a  calmed state and causes it  to shut down for a while, in order to begin the healing process.    Summary: 1. Refer to this scale when providing [place] with your pain level. 2. Be accurate and careful when reporting your pain level. This will help with your care. 3. Over-reporting your pain level will lead to loss of credibility. 4. Even a level of 1/10 means that there is pain and will be treated at our facility. 5. High, inaccurate reporting will be documented as "Symptom Exaggeration", leading to loss of credibility and suspicions of possible secondary gains such as obtaining more narcotics, or wanting to appear disabled, for fraudulent reasons. 6. Only pain levels of 5 or below will be seen at our facility. 7. Pain levels of 6 and above will be sent to the Emergency Department and the appointment cancelled. ____________________________________________________________________________________________   BMI Assessment: Estimated body mass index is 39.11 kg/m as calculated from the following:   Height as of this encounter: '5\' 5"'  (1.651 m).   Weight as of this encounter: 235 lb (106.6 kg).  BMI interpretation table: BMI level Category Range association with higher incidence of chronic pain  <18 kg/m2 Underweight   18.5-24.9 kg/m2 Ideal body weight   25-29.9 kg/m2 Overweight Increased incidence by 20%  30-34.9 kg/m2 Obese (Class I) Increased incidence by 68%  35-39.9 kg/m2 Severe obesity (Class II) Increased incidence by 136%  >40 kg/m2 Extreme obesity (Class III) Increased incidence by 254%   BMI Readings from Last 4 Encounters:  05/26/17 39.11 kg/m  05/12/17 39.27 kg/m  04/07/17 38.61 kg/m  03/24/17 37.61 kg/m   Wt Readings from Last 4 Encounters:  05/26/17 235 lb (106.6 kg)  05/12/17 236 lb (107 kg)  04/07/17 232 lb (105.2 kg)  03/24/17 226 lb (102.5 kg)

## 2017-06-04 ENCOUNTER — Ambulatory Visit (HOSPITAL_BASED_OUTPATIENT_CLINIC_OR_DEPARTMENT_OTHER): Payer: Medicare Other | Admitting: Pain Medicine

## 2017-06-04 ENCOUNTER — Ambulatory Visit
Admission: RE | Admit: 2017-06-04 | Discharge: 2017-06-04 | Disposition: A | Discharge status: Home / Self Care | Payer: Medicare Other | Source: Ambulatory Visit | Attending: Pain Medicine | Admitting: Pain Medicine

## 2017-06-04 ENCOUNTER — Encounter: Payer: Self-pay | Admitting: Pain Medicine

## 2017-06-04 VITALS — BP 126/70 | HR 60 | Temp 98.1°F | Resp 11 | Ht 65.0 in | Wt 236.0 lb

## 2017-06-04 DIAGNOSIS — Z96619 Presence of unspecified artificial shoulder joint: Secondary | ICD-10-CM | POA: Insufficient documentation

## 2017-06-04 DIAGNOSIS — M19012 Primary osteoarthritis, left shoulder: Secondary | ICD-10-CM

## 2017-06-04 DIAGNOSIS — Z79899 Other long term (current) drug therapy: Secondary | ICD-10-CM | POA: Diagnosis not present

## 2017-06-04 DIAGNOSIS — M25512 Pain in left shoulder: Secondary | ICD-10-CM | POA: Diagnosis not present

## 2017-06-04 DIAGNOSIS — M19011 Primary osteoarthritis, right shoulder: Secondary | ICD-10-CM | POA: Diagnosis not present

## 2017-06-04 DIAGNOSIS — M25511 Pain in right shoulder: Secondary | ICD-10-CM | POA: Diagnosis not present

## 2017-06-04 DIAGNOSIS — G8929 Other chronic pain: Secondary | ICD-10-CM | POA: Insufficient documentation

## 2017-06-04 DIAGNOSIS — Z96659 Presence of unspecified artificial knee joint: Secondary | ICD-10-CM | POA: Insufficient documentation

## 2017-06-04 DIAGNOSIS — Z981 Arthrodesis status: Secondary | ICD-10-CM | POA: Insufficient documentation

## 2017-06-04 DIAGNOSIS — Z7982 Long term (current) use of aspirin: Secondary | ICD-10-CM | POA: Insufficient documentation

## 2017-06-04 MED ORDER — LIDOCAINE HCL 2 % IJ SOLN
INTRAMUSCULAR | Status: AC
  Filled 2017-06-04: qty 20

## 2017-06-04 MED ORDER — LACTATED RINGERS IV SOLN: 1000.0000 mL | Freq: Once | INTRAVENOUS | Status: DC

## 2017-06-04 MED ORDER — ROPIVACAINE HCL 2 MG/ML IJ SOLN
INTRAMUSCULAR | Status: AC
  Filled 2017-06-04: qty 10

## 2017-06-04 MED ORDER — MIDAZOLAM HCL 5 MG/5ML IJ SOLN: 1.0000 mg | INTRAMUSCULAR | Status: DC | PRN

## 2017-06-04 MED ORDER — FENTANYL CITRATE (PF) 100 MCG/2ML IJ SOLN: 25.0000 ug | INTRAMUSCULAR | Status: DC | PRN

## 2017-06-04 MED ORDER — METHYLPREDNISOLONE ACETATE 80 MG/ML IJ SUSP
INTRAMUSCULAR | Status: AC
  Filled 2017-06-04: qty 1

## 2017-06-04 MED ORDER — ROPIVACAINE HCL 2 MG/ML IJ SOLN
4.0000 mL | Freq: Once | INTRAMUSCULAR | Status: AC
  Administered 2017-06-04: 4 mL

## 2017-06-04 MED ORDER — METHYLPREDNISOLONE ACETATE 80 MG/ML IJ SUSP
80.0000 mg | Freq: Once | INTRAMUSCULAR | Status: AC
  Administered 2017-06-04: 80 mg

## 2017-06-04 MED ORDER — LIDOCAINE HCL 2 % IJ SOLN
20.0000 mL | Freq: Once | INTRAMUSCULAR | Status: AC
  Administered 2017-06-04: 200 mg

## 2017-06-04 NOTE — Progress Notes (Signed)
Safety precautions to be maintained throughout the outpatient stay will include: orient to surroundings, keep bed in low position, maintain call bell within reach at all times, provide assistance with transfer out of bed and ambulation.  

## 2017-06-04 NOTE — Patient Instructions (Signed)

## 2017-06-04 NOTE — Progress Notes (Signed)
Patient's Name: Regina Campos  MRN: [id number]  Referring Provider: Lenard Simmer, MD  DOB: 10-29-1950  PCP: Lenard Simmer, MD  DOS: 06/04/2017  Note by: Gaspar Cola, MD  Service setting: Ambulatory outpatient  Specialty: Interventional Pain Management  Patient type: Established  Location: [facility] (AMB) Pain Management Facility  Visit type: Interventional Procedure   Primary Reason for Visit: Interventional Pain Management Treatment. CC: Shoulder Pain (left)  Procedure:  Anesthesia, Analgesia, Anxiolysis:  Type: Diagnostic Suprascapular nerve Block Region: Posterior Shoulder & Scapular Areas Level: Superior to the scapular spine, in the lateral aspect of the supraspinatus fossa (Suprescapular notch). Laterality: Left-Side  Type: Local Anesthesia with Moderate (Conscious) Sedation Local Anesthetic: Lidocaine 1% Route: Intravenous (IV) IV Access: Secured Sedation: Meaningful verbal contact was maintained at all times during the procedure  Indication(s): Analgesia and Anxiety   Indications: 1. Chronic shoulder pain (Tertiary source of pain) (Bilateral) (L>R)   2. 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 (year old), female patient, seen today for interventional treatment. She  has a past surgical history that includes Abdominal hysterectomy; Total shoulder replacement; Replacement total knee; Appendectomy; Lumbar fusion; Carpal tunnel release; and Back surgery (07/25/2016). Regina Campos has a current medication list which includes the following prescription(s): albuterol, aspirin, budesonide-formoterol, docusate sodium, fenofibrate, levothyroxine, lubiprostone, montelukast, naloxone, nitroglycerin, rosuvastatin, tiotropium, topiramate, and tramadol, and the following Facility-Administered Medications: fentanyl, lactated ringers, and midazolam. Her primarily  concern today is the Shoulder Pain (left)  Initial Vital Signs: There were no vitals taken for this visit. BMI: Estimated body mass index is 39.27 kg/m as calculated from the following:   Height as of this encounter: 5\' 5"  (1.651 m).   Weight as of this encounter: 236 lb (107 kg).  Risk Assessment: Allergies: Reviewed. She has No Known Allergies.  Allergy Precautions: None required Coagulopathies: Reviewed. None identified.  Blood-thinner therapy: None at this time Active Infection(s): Reviewed. None identified. Regina Campos is afebrile  Site Confirmation: Regina Campos was asked to confirm the procedure and laterality before marking the site Procedure checklist: Completed Consent: Before the procedure and under the influence of no sedative(s), amnesic(s), or anxiolytics, the patient was informed of the treatment options, risks and possible complications. To fulfill our ethical and legal obligations, as recommended by the American Medical Association's Code of Ethics, I have informed the patient of my clinical impression; the nature and purpose of the treatment or procedure; the risks, benefits, and possible complications of the intervention; the alternatives, including doing nothing; the risk(s) and benefit(s) of the alternative treatment(s) or procedure(s); and the risk(s) and benefit(s) of doing nothing. The patient was provided information about the general risks and possible complications associated with the procedure. These may include, but are not limited to: failure to achieve desired goals, infection, bleeding, organ or nerve damage, allergic reactions, paralysis, and death. In addition, the patient was informed of those risks and complications associated to the procedure, such as failure to decrease pain; infection; bleeding; organ or nerve damage with subsequent damage to sensory, motor, and/or autonomic systems, resulting in permanent pain, numbness, and/or weakness of one or several  areas of the body; allergic reactions; (i.e.: anaphylactic reaction); and/or death. Furthermore, the patient was informed of those risks and complications associated with the medications. These include, but are not limited to: allergic reactions (i.e.: anaphylactic or anaphylactoid reaction(s)); adrenal axis suppression; blood sugar elevation that  in diabetics may result in ketoacidosis or comma; water retention that in patients with history of congestive heart failure may result in shortness of breath, pulmonary edema, and decompensation with resultant heart failure; weight gain; swelling or edema; medication-induced neural toxicity; particulate matter embolism and blood vessel occlusion with resultant organ, and/or nervous system infarction; and/or aseptic necrosis of one or more joints. Finally, the patient was informed that Medicine is not an exact science; therefore, there is also the possibility of unforeseen or unpredictable risks and/or possible complications that may result in a catastrophic outcome. The patient indicated having understood very clearly. We have given the patient no guarantees and we have made no promises. Enough time was given to the patient to ask questions, all of which were answered to the patient's satisfaction. Regina Campos has indicated that she wanted to continue with the procedure. Attestation: I, the ordering provider, attest that I have discussed with the patient the benefits, risks, side-effects, alternatives, likelihood of achieving goals, and potential problems during recovery for the procedure that I have provided informed consent. Date: 06/04/2017; Time: 8:01 AM  Pre-Procedure Preparation:  Monitoring: As per clinic protocol. Respiration, ETCO2, SpO2, BP, heart rate and rhythm monitor placed and checked for adequate function Safety Precautions: Patient was assessed for positional comfort and pressure points before starting the procedure. Time-out: I initiated and  conducted the "Time-out" before starting the procedure, as per protocol. The patient was asked to participate by confirming the accuracy of the "Time Out" information. Verification of the correct person, site, and procedure were performed and confirmed by me, the nursing staff, and the patient. "Time-out" conducted as per Joint Commission's Universal Protocol (UP.01.01.01). "Time-out" Date & Time: 06/04/2017; 1053 hrs.  Description of Procedure Process:   Position: Prone Target Area: Suprascapular notch. Approach: Posterior approach. Area Prepped: Entire shoulder Area Prepping solution: ChloraPrep (2% chlorhexidine gluconate and 70% isopropyl alcohol) Safety Precautions: Aspiration looking for blood return was conducted prior to all injections. At no point did we inject any substances, as a needle was being advanced. No attempts were made at seeking any paresthesias. Safe injection practices and needle disposal techniques used. Medications properly checked for expiration dates. SDV (single dose vial) medications used. Description of the Procedure: Protocol guidelines were followed. The patient was placed in position over the procedure table. The target area was identified and the area prepped in the usual manner. Skin & deeper tissues infiltrated with local anesthetic. Appropriate amount of time allowed to pass for local anesthetics to take effect. The procedure needles were then advanced to the target area. Proper needle placement secured. Negative aspiration confirmed. Solution injected in intermittent fashion, asking for systemic symptoms every 0.5cc of injectate. The needles were then removed and the area cleansed, making sure to leave some of the prepping solution back to take advantage of its long term bactericidal properties. Vitals:   06/04/17 1050 06/04/17 1055 06/04/17 1100 06/04/17 1105  BP: 115/78 123/64 114/66 126/70  Pulse: 78 (!) 59 (!) 56 60  Resp: 12 10 11 11   Temp:      SpO2: 97%  98% 98% 99%  Weight:      Height:        Start Time: 1053 hrs. End Time: 1100 hrs. Materials:  Needle(s) Type: Regular needle Gauge: 22G Length: 3.5-in Medication(s): We administered lidocaine, methylPREDNISolone acetate, and ropivacaine (PF) 2 mg/mL (0.2%). Please see chart orders for dosing details.  Imaging Guidance (Non-Spinal):  Type of Imaging Technique: Fluoroscopy Guidance (Non-Spinal) Indication(s): Assistance in  needle guidance and placement for procedures requiring needle placement in or near specific anatomical locations not easily accessible without such assistance. Exposure Time: Please see nurses notes. Contrast: Before injecting any contrast, we confirmed that the patient did not have an allergy to iodine, shellfish, or radiological contrast. Once satisfactory needle placement was completed at the desired level, radiological contrast was injected. Contrast injected under live fluoroscopy. No contrast complications. See chart for type and volume of contrast used. Fluoroscopic Guidance: I was personally present during the use of fluoroscopy. "Tunnel Vision Technique" used to obtain the best possible view of the target area. Parallax error corrected before commencing the procedure. "Direction-depth-direction" technique used to introduce the needle under continuous pulsed fluoroscopy. Once target was reached, antero-posterior, oblique, and lateral fluoroscopic projection used confirm needle placement in all planes. Images permanently stored in EMR. Interpretation: I personally interpreted the imaging intraoperatively. Adequate needle placement confirmed in multiple planes. Appropriate spread of contrast into desired area was observed. No evidence of afferent or efferent intravascular uptake. Permanent images saved into the patient's record.  Antibiotic Prophylaxis:  Indication(s): None identified Antibiotic given: None  Post-operative Assessment:  EBL: None Complications: No  immediate post-treatment complications observed by team, or reported by patient. Note: The patient tolerated the entire procedure well. A repeat set of vitals were taken after the procedure and the patient was kept under observation following institutional policy, for this type of procedure. Post-procedural neurological assessment was performed, showing return to baseline, prior to discharge. The patient was provided with post-procedure discharge instructions, including a section on how to identify potential problems. Should any problems arise concerning this procedure, the patient was given instructions to immediately contact us, at any time, without hesitation. In any case, we plan to contact the patient by telephone for a follow-up status report regarding this interventional procedure. Comments:  No additional relevant information.  Plan of Care    Imaging Orders     DG C-Arm 1-60 Min-No Report  Procedure Orders     SUPRASCAPULAR NERVE BLOCK  Medications ordered for procedure: Meds ordered this encounter  Medications  . lactated ringers infusion 1,000 mL  . midazolam (VERSED) 5 MG/5ML injection 1-2 mg    Make sure Flumazenil is available in the pyxis when using this medication. If oversedation occurs, administer 0.2 mg IV over 15 sec. If after 45 sec no response, administer 0.2 mg again over 1 min; may repeat at 1 min intervals; not to exceed 4 doses (1 mg)  . fentaNYL (SUBLIMAZE) injection 25-50 mcg    Make sure Narcan is available in the pyxis when using this medication. In the event of respiratory depression (RR< 8/min): Titrate NARCAN (naloxone) in increments of 0.1 to 0.2 mg IV at 2-3 minute intervals, until desired degree of reversal.  . lidocaine (XYLOCAINE) 2 % (with pres) injection 400 mg  . methylPREDNISolone acetate (DEPO-MEDROL) injection 80 mg  . ropivacaine (PF) 2 mg/mL (0.2%) (NAROPIN) injection 4 mL   Medications administered: We administered lidocaine,  methylPREDNISolone acetate, and ropivacaine (PF) 2 mg/mL (0.2%).  See the medical record for exact dosing, route, and time of administration.  New Prescriptions   No medications on file   Disposition: Discharge home  Discharge Date & Time: 06/04/2017; 1104 hrs.   Physician-requested Follow-up: Return for post-procedure eval by Dr. Dossie Arbour in 2 wks. Future Appointments Date Time Provider [place]  06/24/2017 8:15 AM Milinda Pointer, MD [facility] None  10/05/2017 9:00 AM Vevelyn Francois, NP [facility] None   Primary  Care Physician: Lenard Simmer, MD Location: [facility] Note by: Gaspar Cola, MD Date: 06/04/2017; Time: 11:24 AM  Disclaimer:  Medicine is not an exact science. The only guarantee in medicine is that nothing is guaranteed. It is important to note that the decision to proceed with this intervention was based on the information collected from the patient. The Data and conclusions were drawn from the patient's questionnaire, the interview, and the physical examination. Because the information was provided in large part by the patient, it cannot be guaranteed that it has not been purposely or unconsciously manipulated. Every effort has been made to obtain as much relevant data as possible for this evaluation. It is important to note that the conclusions that lead to this procedure are derived in large part from the available data. Always take into account that the treatment will also be dependent on availability of resources and existing treatment guidelines, considered by other Pain Management Practitioners as being common knowledge and practice, at the time of the intervention. For Medico-Legal purposes, it is also important to point out that variation in procedural techniques and pharmacological choices are the acceptable norm. The indications, contraindications, technique, and results of the above procedure should only be interpreted  and judged by a Board-Certified Interventional Pain Specialist with extensive familiarity and expertise in the same exact procedure and technique.

## 2017-06-05 ENCOUNTER — Telehealth: Payer: Self-pay

## 2017-06-05 NOTE — Telephone Encounter (Signed)
No answer. Instructed to call if needed. 

## 2017-06-23 NOTE — Progress Notes (Deleted)
Patient's Name: Regina Campos  MRN: [id number]  Referring Provider: Lenard Simmer, MD  DOB: May 29, 1951  PCP: Lenard Simmer, MD  DOS: 06/24/2017  Note by: Gaspar Cola, MD  Service setting: Ambulatory outpatient  Specialty: Interventional Pain Management  Location: [facility] (AMB) Pain Management Facility    Patient type: Established   Primary Reason(s) for Visit: Encounter for post-procedure evaluation of chronic illness with mild to moderate exacerbation CC: No chief complaint on file.  HPI  Regina Campos is a 66 y.o. year old, female patient, who comes today for a post-procedure evaluation. She has Encounter for therapeutic drug level monitoring; Long term current use of opiate analgesic; Uncomplicated opioid dependence ([place]); Opiate use (40 MME/Day); Chronic neck pain (Location of Primary Source of Pain); Chronic low back pain (Location of Secondary source of pain); Lumbar radicular pain; Lumbar spondylosis with radicular symptoms; Chronic pain syndrome; Pulmonary emphysema ([place]); Atypical migraine; Apnea, sleep; High cholesterol; Hypothyroidism; History of cervical spinal surgery; Cervical foraminal stenosis; Cervicogenic headache; Fibromyalgia; History of cardiac arrhythmia; Depression; Obesity; 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); Cervical central spinal stenosis; Long term prescription opiate use; Myofascial pain; Cervical paraspinal muscle spasm; Cervical spondylosis with radiculopathy (Right side); 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); Opioid-induced constipation ([facility]); 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 Her primarily concern today is the No chief complaint on file.  Pain Assessment: Location:     Radiating:   Onset:   Duration:   Quality:   Severity:  /10 (self-reported pain score)  Note: Reported level is compatible with observation.                         When using our objective Pain Scale, levels between 6 and 10/10 are said to belong in an emergency room, as it progressively worsens from a 6/10, described as severely limiting, requiring emergency care not usually available at an outpatient pain management facility. At a 6/10 level, communication becomes difficult and requires great effort. Assistance to reach the emergency department may be required. Facial flushing and profuse sweating along with potentially dangerous increases in heart rate and blood pressure will be evident. Effect on ADL:   Timing:   Modifying factors:    Regina Campos comes in today for post-procedure evaluation after the treatment done on 06/04/2017.  Further details on both, my assessment(s), as well as the proposed treatment plan, please see below.  Post-Procedure Assessment  06/04/2017 Procedure: Diagnostic left-sided suprascapular nerve block under fluoroscopic guidance, no sedation Pre-procedure pain score:  3/10 Post-procedure pain score: 0/10 (100% relief) Influential Factors: BMI:   Intra-procedural challenges: None observed.         Assessment challenges: None detected.              Reported side-effects: None.        Post-procedural adverse reactions or complications: None reported         Sedation: No sedation used. When no sedatives are used, the analgesic levels obtained are directly associated to the effectiveness of the local anesthetics. However, when sedation  is provided, the level of  analgesia obtained during the initial 1 hour following the intervention, is believed to be the result of a combination of factors. These factors may include, but are not limited to: 1. The effectiveness of the local anesthetics used. 2. The effects of the analgesic(s) and/or anxiolytic(s) used. 3. The degree of discomfort experienced by the patient at the time of the procedure. 4. The patients ability and reliability in recalling and recording the events. 5. The presence and influence of possible secondary gains and/or psychosocial factors. Reported result: Relief experienced during the 1st hour after the procedure:   (Ultra-Short Term Relief)            Interpretative annotation: Clinically appropriate result. Analgesia during this period is likely to be Local Anesthetic and/or IV Sedative (Analgesic/Anxiolytic) related.          Effects of local anesthetic: The analgesic effects attained during this period are directly associated to the localized infiltration of local anesthetics and therefore cary significant diagnostic value as to the etiological location, or anatomical origin, of the pain. Expected duration of relief is directly dependent on the pharmacodynamics of the local anesthetic used. Long-acting (4-6 hours) anesthetics used.  Reported result: Relief during the next 4 to 6 hour after the procedure:   (Short-Term Relief)            Interpretative annotation: Clinically appropriate result. Analgesia during this period is likely to be Local Anesthetic-related.          Long-term benefit: Defined as the period of time past the expected duration of local anesthetics (1 hour for short-acting and 4-6 hours for long-acting). With the possible exception of prolonged sympathetic blockade from the local anesthetics, benefits during this period are typically attributed to, or associated with, other factors such as analgesic sensory neuropraxia, antiinflammatory effects, or beneficial biochemical changes  provided by agents other than the local anesthetics.  Reported result: Extended relief following procedure:   (Long-Term Relief)            Interpretative annotation: Clinically appropriate result. Good relief. No permanent benefit expected. Inflammation plays a part in the etiology to the pain.          Current benefits: Defined as reported results that persistent at this point in time.   Analgesia: *** %            Function: Somewhat improved ROM: Somewhat improved Interpretative annotation: Recurrence of symptoms. No permanent benefit expected. Effective diagnostic intervention.          Interpretation: Results would suggest a successful diagnostic intervention.                  Plan:  Please see "Plan of Care" for details.        Laboratory Chemistry  Inflammation Markers (CRP: Acute Phase) (ESR: Chronic Phase) Lab Results  Component Value Date   ESRSEDRATE 11 12/06/2012                 Renal Function Markers No results found for: BUN, CREATININE, GFRAA, GFRNONAA               Hepatic Function Markers No results found for: AST, ALT, ALBUMIN, ALKPHOS, HCVAB               Electrolytes No results found for: NA, K, CL, CALCIUM, MG               Neuropathy Markers No results found for: [id number]  Bone Pathology Markers No results found for: Hendricks Milo, [id number], [id number], [id number], 25OHVITD1, 25OHVITD2, 25OHVITD3, CALCIUM, TESTOFREE, TESTOSTERONE               Coagulation Parameters No results found for: INR, LABPROT, APTT, PLT               Cardiovascular Markers No results found for: BNP, HGB, HCT               Note: Lab results reviewed.  Recent Diagnostic Imaging Results  DG C-Arm 1-60 Min-No Report Fluoroscopy was utilized by the requesting physician.  No radiographic  interpretation.   Complexity Note: Imaging results reviewed. Results shared with Ms. Hupfer, using Layman's terms.                         Meds   Current Outpatient  Medications:  .  albuterol (PROVENTIL HFA;VENTOLIN HFA) 108 (90 Base) MCG/ACT inhaler, Inhale into the lungs., Disp: , Rfl:  .  aspirin 81 MG tablet, Take 81 mg by mouth daily., Disp: , Rfl:  .  budesonide-formoterol (SYMBICORT) 160-4.5 MCG/ACT inhaler, Inhale 2 puffs into the lungs 2 (two) times daily., Disp: , Rfl:  .  docusate sodium (COLACE) 100 MG capsule, Take 2 capsules (200 mg total) by mouth at bedtime as needed for moderate constipation., Disp: 180 capsule, Rfl: 0 .  fenofibrate (TRICOR) 145 MG tablet, Take 145 mg by mouth daily., Disp: , Rfl:  .  levothyroxine (SYNTHROID, LEVOTHROID) 125 MCG tablet, Take 125 mcg by mouth daily before breakfast., Disp: , Rfl:  .  lubiprostone (AMITIZA) 24 MCG capsule, Take 24 mcg by mouth 2 (two) times daily with a meal., Disp: , Rfl:  .  montelukast (SINGULAIR) 10 MG tablet, Take by mouth., Disp: , Rfl:  .  naloxone (NARCAN) 0.4 MG/ML injection, Inject 1 mL (0.4 mg total) into the muscle as needed (for pain medication overdose.). Inject into thigh muscle, then call 911., Disp: 2 mL, Rfl: 0 .  nitroGLYCERIN (NITROSTAT) 0.4 MG SL tablet, TAKE ONE TABLET SUBLINGUALLY EVERY 5 MINUTES X 3 FOR CHEST PAIN, Disp: , Rfl: 0 .  rosuvastatin (CRESTOR) 10 MG tablet, Take 10 mg by mouth daily., Disp: , Rfl:  .  tiotropium (SPIRIVA) 18 MCG inhalation capsule, Place into inhaler and inhale., Disp: , Rfl:  .  topiramate (TOPAMAX) 100 MG tablet, Take 100 mg by mouth 2 (two) times daily., Disp: , Rfl:  .  traMADol (ULTRAM) 50 MG tablet, Take 2 tablets (100 mg total) by mouth every 6 (six) hours as needed for moderate pain or severe pain., Disp: 720 tablet, Rfl: 1  ROS  Constitutional: Denies any fever or chills Gastrointestinal: No reported hemesis, hematochezia, vomiting, or acute GI distress Musculoskeletal: Denies any acute onset joint swelling, redness, loss of ROM, or weakness Neurological: No reported episodes of acute onset apraxia, aphasia, dysarthria, agnosia,  amnesia, paralysis, loss of coordination, or loss of consciousness  Allergies  Ms. Konecny has No Known Allergies.  [place]  Drug: Ms. Didion  reports that she does not use drugs. Alcohol:  reports that she does not drink alcohol. Tobacco:  reports that  has never smoked. she has never used smokeless tobacco. Medical:  has a past medical history of Arthritis, Displacement of lumbar intervertebral disc (06/04/2015), History of cardiac arrhythmia (06/04/2015), History of cervical spinal surgery (06/04/2015), Hyperlipidemia, Hypothyroidism (06/04/2015), and Thyroid disease. Surgical: Ms. Staebell  has a past surgical history that includes Abdominal hysterectomy; Total  shoulder replacement; Replacement total knee; Appendectomy; Lumbar fusion; Carpal tunnel release; and Back surgery (07/25/2016). Family: family history includes Cancer in her mother; Heart disease in her father.  Constitutional Exam  General appearance: Well nourished, well developed, and well hydrated. In no apparent acute distress There were no vitals filed for this visit. BMI Assessment: Estimated body mass index is 39.27 kg/m as calculated from the following:   Height as of 06/04/17: '5\' 5"'  (1.651 m).   Weight as of 06/04/17: 236 lb (107 kg).  BMI interpretation table: BMI level Category Range association with higher incidence of chronic pain  <18 kg/m2 Underweight   18.5-24.9 kg/m2 Ideal body weight   25-29.9 kg/m2 Overweight Increased incidence by 20%  30-34.9 kg/m2 Obese (Class I) Increased incidence by 68%  35-39.9 kg/m2 Severe obesity (Class II) Increased incidence by 136%  >40 kg/m2 Extreme obesity (Class III) Increased incidence by 254%   BMI Readings from Last 4 Encounters:  06/04/17 39.27 kg/m  05/26/17 39.11 kg/m  05/12/17 39.27 kg/m  04/07/17 38.61 kg/m   Wt Readings from Last 4 Encounters:  06/04/17 236 lb (107 kg)  05/26/17 235 lb (106.6 kg)  05/12/17 236 lb (107 kg)  04/07/17 232 lb (105.2 kg)   Psych/Mental status: Alert, oriented x 3 (person, place, & time)       Eyes: PERLA Respiratory: No evidence of acute respiratory distress  Cervical Spine Area Exam  Skin & Axial Inspection: No masses, redness, edema, swelling, or associated skin lesions Alignment: Symmetrical Functional ROM: Unrestricted ROM      Stability: No instability detected Muscle Tone/Strength: Functionally intact. No obvious neuro-muscular anomalies detected. Sensory (Neurological): Unimpaired Palpation: No palpable anomalies              Upper Extremity (UE) Exam    Side: Right upper extremity  Side: Left upper extremity  Skin & Extremity Inspection: Skin color, temperature, and hair growth are WNL. No peripheral edema or cyanosis. No masses, redness, swelling, asymmetry, or associated skin lesions. No contractures.  Skin & Extremity Inspection: Skin color, temperature, and hair growth are WNL. No peripheral edema or cyanosis. No masses, redness, swelling, asymmetry, or associated skin lesions. No contractures.  Functional ROM: Unrestricted ROM          Functional ROM: Unrestricted ROM          Muscle Tone/Strength: Functionally intact. No obvious neuro-muscular anomalies detected.  Muscle Tone/Strength: Functionally intact. No obvious neuro-muscular anomalies detected.  Sensory (Neurological): Unimpaired          Sensory (Neurological): Unimpaired          Palpation: No palpable anomalies              Palpation: No palpable anomalies              Specialized Test(s): Deferred         Specialized Test(s): Deferred          Thoracic Spine Area Exam  Skin & Axial Inspection: No masses, redness, or swelling Alignment: Symmetrical Functional ROM: Unrestricted ROM Stability: No instability detected Muscle Tone/Strength: Functionally intact. No obvious neuro-muscular anomalies detected. Sensory (Neurological): Unimpaired Muscle strength & Tone: No palpable anomalies  Lumbar Spine Area Exam  Skin & Axial  Inspection: No masses, redness, or swelling Alignment: Symmetrical Functional ROM: Unrestricted ROM      Stability: No instability detected Muscle Tone/Strength: Functionally intact. No obvious neuro-muscular anomalies detected. Sensory (Neurological): Unimpaired Palpation: No palpable anomalies       Provocative  Tests: Lumbar Hyperextension and rotation test: evaluation deferred today       Lumbar Lateral bending test: evaluation deferred today       Patrick's Maneuver: evaluation deferred today                    Gait & Posture Assessment  Ambulation: Unassisted Gait: Relatively normal for age and body habitus Posture: WNL   Lower Extremity Exam    Side: Right lower extremity  Side: Left lower extremity  Skin & Extremity Inspection: Skin color, temperature, and hair growth are WNL. No peripheral edema or cyanosis. No masses, redness, swelling, asymmetry, or associated skin lesions. No contractures.  Skin & Extremity Inspection: Skin color, temperature, and hair growth are WNL. No peripheral edema or cyanosis. No masses, redness, swelling, asymmetry, or associated skin lesions. No contractures.  Functional ROM: Unrestricted ROM          Functional ROM: Unrestricted ROM          Muscle Tone/Strength: Functionally intact. No obvious neuro-muscular anomalies detected.  Muscle Tone/Strength: Functionally intact. No obvious neuro-muscular anomalies detected.  Sensory (Neurological): Unimpaired  Sensory (Neurological): Unimpaired  Palpation: No palpable anomalies  Palpation: No palpable anomalies   Assessment  Primary Diagnosis & Pertinent Problem List: The primary encounter diagnosis was Chronic shoulder pain [facility] source of pain) (Bilateral) (L>R). 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  Status Diagnosis  Controlled Controlled Controlled 1. Chronic shoulder pain (Tertiary  source of pain) (Bilateral) (L>R)   2. 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 Chronic neck pain (Location of Primary Source of Pain)     Problems updated and reviewed during this visit: No problems updated. Plan of Care  Pharmacotherapy (Medications Ordered): No orders of the defined types were placed in this encounter. This SmartLink is deprecated. Use AVSMEDLIST instead to display the medication list for a patient. Medications administered today: Donne Anon. Kiernan had no medications administered during this visit.  Procedure Orders    No procedure(s) ordered today   Lab Orders  No laboratory test(s) ordered today   Imaging Orders  No imaging studies ordered today   Referral Orders  No referral(s) requested today    Interventional management options: Planned, scheduled, and/or pending:   ***   Considering:   Diagnostic bilateral suprascapular NB Possiblebilateral suprascapular RFA Diagnostic Left intra-articular shoulder joint injection Bilateral intra-articular Hip injection Palliative right-sided L3-4 lumbar epiduralsteroid injection Palliative right-sided L4-5 transforaminal epidural steroid injection Palliative right-sided L2-3 lumbar epiduralsteroid injection Palliative right-sided C7-T1 cervical epiduralsteroid injection Diagnostic bilateral cervical facet block Possible bilateral cervical facet radiofrequencyablation Diagnostic bilateral lumbar facet block Possible bilateral lumbar facet radiofrequencyablation   Palliative PRN treatment(s):   Palliative right-sided L3-4 lumbar epiduralsteroid injection Palliative right-sided L4-5 transforaminal epiduralsteroid injection Palliative right-sided L2-3 lumbar epiduralsteroid injection Palliative right-sided C7-T1 cervical epiduralsteroid injection Palliativebilateral shoulder joint injection Palliativebilateral suprascapular nerve block   Provider-requested follow-up: No Follow-up on  file.  Future Appointments  Date Time Provider Saline  06/24/2017  8:15 AM Milinda Pointer, MD [facility] None  10/05/2017  9:00 AM Vevelyn Francois, NP [facility] None   Primary Care Physician: Lenard Simmer, MD Location: [facility] Note by: Gaspar Cola, MD Date: 06/24/2017; Time: 12:50 PM

## 2017-06-24 ENCOUNTER — Ambulatory Visit: Attending: Pain Medicine | Admitting: Pain Medicine

## 2017-06-24 ENCOUNTER — Ambulatory Visit: Admitting: Nurse Practitioner

## 2017-08-26 ENCOUNTER — Encounter: Payer: Self-pay | Admitting: Internal Medicine

## 2017-09-09 ENCOUNTER — Ambulatory Visit (INDEPENDENT_AMBULATORY_CARE_PROVIDER_SITE_OTHER): Payer: Medicare Other | Admitting: Internal Medicine

## 2017-09-09 DIAGNOSIS — R0602 Shortness of breath: Secondary | ICD-10-CM | POA: Diagnosis not present

## 2017-09-15 ENCOUNTER — Encounter: Payer: Self-pay | Admitting: Internal Medicine

## 2017-09-15 NOTE — Progress Notes (Signed)
[place], [id number]  DATE OF SERVICE:  September 09, 2017  Complete Pulmonary Function Testing Interpretation:  FINDINGS:  Forced Vital Capacity is  normal. Patients FEV1 is  normal. FEV1/FVC ratio is  normal. Post bronchodilator there is  No significant improvement in the FEV1  However clinically improving may occur in the absence as per med trach improvement. Total Lung Capacity by gas dilution method is  normal.  Total Lung Capacity by body box is  normal.  Residual Volume is  decreased. RV/TLC ratio is  decreased.  Functional Residual Capacity is  Mildly decreased. The DLCO is  Mildly decreased but normal when corrected for alveolar volume.  IMPRESSION:  -This Pulmonary Function Study is  Showing normal spirometry and lung volumes. -After Bronchodilators were given there was  No significant improvement in the FEV1 however clinical improvement may occur in the absence of spirometric in pressure med and does not preclude the use of bronchodilators. -There is  no hyperinflation -The DLCO  Mildly decreased but normal when corrected for alveolar volume.   Allyne Gee, MD [facility] Pulmonary Critical Care Medicine Sleep Medicine

## 2017-09-15 NOTE — Patient Instructions (Signed)

## 2017-10-05 ENCOUNTER — Encounter: Admitting: Nurse Practitioner

## 2017-10-07 ENCOUNTER — Other Ambulatory Visit: Payer: Self-pay

## 2017-10-07 ENCOUNTER — Ambulatory Visit: Payer: Medicare Other | Attending: Nurse Practitioner | Admitting: Nurse Practitioner

## 2017-10-07 ENCOUNTER — Encounter: Payer: Self-pay | Admitting: Nurse Practitioner

## 2017-10-07 VITALS — BP 123/73 | HR 65 | Temp 98.1°F | Resp 16 | Ht 65.0 in | Wt 235.0 lb

## 2017-10-07 DIAGNOSIS — E7800 Pure hypercholesterolemia, unspecified: Secondary | ICD-10-CM | POA: Diagnosis not present

## 2017-10-07 DIAGNOSIS — T402X5A Adverse effect of other opioids, initial encounter: Secondary | ICD-10-CM | POA: Diagnosis not present

## 2017-10-07 DIAGNOSIS — E039 Hypothyroidism, unspecified: Secondary | ICD-10-CM | POA: Insufficient documentation

## 2017-10-07 DIAGNOSIS — M19011 Primary osteoarthritis, right shoulder: Secondary | ICD-10-CM | POA: Insufficient documentation

## 2017-10-07 DIAGNOSIS — M19012 Primary osteoarthritis, left shoulder: Secondary | ICD-10-CM | POA: Diagnosis not present

## 2017-10-07 DIAGNOSIS — M48061 Spinal stenosis, lumbar region without neurogenic claudication: Secondary | ICD-10-CM | POA: Diagnosis not present

## 2017-10-07 DIAGNOSIS — Z96611 Presence of right artificial shoulder joint: Secondary | ICD-10-CM | POA: Diagnosis not present

## 2017-10-07 DIAGNOSIS — Z79891 Long term (current) use of opiate analgesic: Secondary | ICD-10-CM | POA: Insufficient documentation

## 2017-10-07 DIAGNOSIS — F329 Major depressive disorder, single episode, unspecified: Secondary | ICD-10-CM | POA: Insufficient documentation

## 2017-10-07 DIAGNOSIS — M25552 Pain in left hip: Secondary | ICD-10-CM | POA: Diagnosis not present

## 2017-10-07 DIAGNOSIS — M25511 Pain in right shoulder: Secondary | ICD-10-CM | POA: Insufficient documentation

## 2017-10-07 DIAGNOSIS — R51 Headache: Secondary | ICD-10-CM | POA: Insufficient documentation

## 2017-10-07 DIAGNOSIS — K5900 Constipation, unspecified: Secondary | ICD-10-CM | POA: Insufficient documentation

## 2017-10-07 DIAGNOSIS — M4726 Other spondylosis with radiculopathy, lumbar region: Secondary | ICD-10-CM | POA: Insufficient documentation

## 2017-10-07 DIAGNOSIS — M542 Cervicalgia: Secondary | ICD-10-CM | POA: Insufficient documentation

## 2017-10-07 DIAGNOSIS — M4722 Other spondylosis with radiculopathy, cervical region: Secondary | ICD-10-CM | POA: Diagnosis not present

## 2017-10-07 DIAGNOSIS — M1610 Unilateral primary osteoarthritis, unspecified hip: Secondary | ICD-10-CM | POA: Diagnosis not present

## 2017-10-07 DIAGNOSIS — F1120 Opioid dependence, uncomplicated: Secondary | ICD-10-CM | POA: Diagnosis not present

## 2017-10-07 DIAGNOSIS — E669 Obesity, unspecified: Secondary | ICD-10-CM | POA: Diagnosis not present

## 2017-10-07 DIAGNOSIS — E785 Hyperlipidemia, unspecified: Secondary | ICD-10-CM | POA: Insufficient documentation

## 2017-10-07 DIAGNOSIS — M25572 Pain in left ankle and joints of left foot: Secondary | ICD-10-CM | POA: Diagnosis present

## 2017-10-07 DIAGNOSIS — M12812 Other specific arthropathies, not elsewhere classified, left shoulder: Secondary | ICD-10-CM | POA: Insufficient documentation

## 2017-10-07 DIAGNOSIS — K5903 Drug induced constipation: Secondary | ICD-10-CM

## 2017-10-07 DIAGNOSIS — M25571 Pain in right ankle and joints of right foot: Secondary | ICD-10-CM

## 2017-10-07 DIAGNOSIS — M25512 Pain in left shoulder: Secondary | ICD-10-CM | POA: Diagnosis present

## 2017-10-07 DIAGNOSIS — M797 Fibromyalgia: Secondary | ICD-10-CM | POA: Diagnosis not present

## 2017-10-07 DIAGNOSIS — Z6830 Body mass index (BMI) 30.0-30.9, adult: Secondary | ICD-10-CM | POA: Insufficient documentation

## 2017-10-07 DIAGNOSIS — J449 Chronic obstructive pulmonary disease, unspecified: Secondary | ICD-10-CM | POA: Diagnosis not present

## 2017-10-07 DIAGNOSIS — G8929 Other chronic pain: Secondary | ICD-10-CM

## 2017-10-07 DIAGNOSIS — G894 Chronic pain syndrome: Secondary | ICD-10-CM | POA: Insufficient documentation

## 2017-10-07 DIAGNOSIS — Z79899 Other long term (current) drug therapy: Secondary | ICD-10-CM | POA: Insufficient documentation

## 2017-10-07 MED ORDER — DOCUSATE SODIUM 100 MG PO CAPS: 200.0000 mg | ORAL_CAPSULE | Freq: Every evening | ORAL | 0 refills | Status: DC | PRN

## 2017-10-07 MED ORDER — TRAMADOL HCL 50 MG PO TABS: 100.0000 mg | ORAL_TABLET | Freq: Four times a day (QID) | ORAL | 1 refills | Status: DC | PRN

## 2017-10-07 NOTE — Progress Notes (Signed)
Nursing Pain Medication Assessment:  Safety precautions to be maintained throughout the outpatient stay will include: orient to surroundings, keep bed in low position, maintain call bell within reach at all times, provide assistance with transfer out of bed and ambulation.  Medication Inspection Compliance: Pill count conducted under aseptic conditions, in front of the patient. Neither the pills nor the bottle was removed from the patient's sight at any time. Once count was completed pills were immediately returned to the patient in their original bottle.  Medication: Tramadol (Ultram) Pill/Patch Count: 149 of 360 pills remain Pill/Patch Appearance: Markings consistent with prescribed medication Bottle Appearance: Standard pharmacy container. Clearly labeled. Filled Date: 01 / 04 / 2019 Last Medication intake:  Today

## 2017-10-07 NOTE — Progress Notes (Signed)
Patient's Name: Regina Campos  MRN: [id number]  Referring Provider: Lenard Simmer, MD  DOB: 1951-02-01  PCP: Lenard Simmer, MD  DOS: 10/07/2017  Note by: Vevelyn Francois NP  Service setting: Ambulatory outpatient  Specialty: Interventional Pain Management  Location: [facility] (AMB) Pain Management Facility    Patient type: Established    Primary Reason(s) for Visit: Encounter for prescription drug management & post-procedure evaluation of chronic illness with mild to moderate exacerbation(Level of risk: moderate) CC: Ankle Pain (left) and Shoulder Pain (left)  HPI  [name] is a 67 y.o. year old, female patient, who comes today for a post-procedure evaluation and medication management. She has Encounter for therapeutic drug level monitoring; Long term current use of opiate analgesic; Uncomplicated opioid dependence ([place]); Opiate use (40 MME/Day); Chronic neck pain (Location of Primary Source of Pain); Chronic low back pain (Location of Secondary source of pain); Lumbar radicular pain; Lumbar spondylosis with radicular symptoms; Chronic pain syndrome; Pulmonary emphysema ([place]); Atypical migraine; Apnea, sleep; High cholesterol; Hypothyroidism; History of cervical spinal surgery; Cervical foraminal stenosis; Cervicogenic headache; Fibromyalgia; History of cardiac arrhythmia; Depression; Obesity; 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); Cervical central spinal stenosis; Long term prescription opiate use; Myofascial pain; Cervical paraspinal muscle spasm; Cervical spondylosis with radiculopathy (Right side); 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); Opioid-induced constipation (OIC); 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 Her primarily concern today is the Ankle Pain (left) and Shoulder Pain (left)  Pain Assessment: Location: Left Ankle Onset: More than a month ago Duration: Chronic pain Quality: Constant, Dull, Sharp, Stabbing Severity: 8 /10 (self-reported pain score)  Note: Reported level is compatible with observation. Clinically the patient looks like a 1/10 A 1/10 is viewed as "Mild" and described as nagging, annoying, but not interfering with basic activities of daily living (ADL). [name] is able to eat, bathe, get dressed, do toileting (being able to get on and off the toilet and perform personal hygiene functions), transfer (move in and out of bed or a chair without assistance), and maintain continence (able to control bladder and bowel functions). Physiologic parameters such as blood pressure and heart rate apear wnl. Information on the proper use of the pain scale provided to the patient today. When using our objective Pain Scale, levels between 6 and 10/10 are said to belong in an emergency room, as it progressively worsens from a 6/10, described as severely limiting, requiring emergency care not usually available at an outpatient pain management facility. At a 6/10 level, communication becomes difficult and requires great effort. Assistance to reach the emergency department may be required. Facial flushing and profuse sweating along with potentially dangerous increases in heart rate and blood pressure will be evident. Timing: Constant Modifying factors: medications  Ms. Liss was last seen on 05/26/2017 for a procedure. During today's appointment we reviewed [name]'s  post-procedure results, as well as her outpatient medication regimen. She has a history of left ankle fracture. She admits that she does sit with her ankle underneath her. She states that the pain has increased with  the weather changes. She is aware that there is not a lot of treatment options. She denies any new concerns today.   Further details on both, my assessment(s), as well as the proposed treatment plan, please see below.  Controlled Substance Pharmacotherapy Assessment REMS (Risk Evaluation and Mitigation Strategy)  Analgesic:Tramadol 50 mg 2 tablets every 6 hours (400 mg/day) MME/day:40 mg/day    Rise Patience  10/07/2017  9:22 AM  Sign at close encounter Nursing Pain Medication Assessment:  Safety precautions to be maintained throughout the outpatient stay will include: orient to surroundings, keep bed in low position, maintain call bell within reach at all times, provide assistance with transfer out of bed and ambulation.  Medication Inspection Compliance: Pill count conducted under aseptic conditions, in front of the patient. Neither the pills nor the bottle was removed from the patient's sight at any time. Once count was completed pills were immediately returned to the patient in their original bottle.  Medication: Tramadol (Ultram) Pill/Patch Count: 149 of 360 pills remain Pill/Patch Appearance: Markings consistent with prescribed medication Bottle Appearance: Standard pharmacy container. Clearly labeled. Filled Date: 01 / 04 / 2019 Last Medication intake:  Today   Pharmacokinetics: Liberation and absorption (onset of action): WNL Distribution (time to peak effect): WNL Metabolism and excretion (duration of action): WNL         Pharmacodynamics: Desired effects: Analgesia: [name] reports >50% benefit. Functional ability: Patient reports that medication allows her to accomplish basic ADLs Clinically meaningful improvement in function (CMIF): Sustained CMIF goals  met Perceived effectiveness: Described as relatively effective, allowing for increase in activities of daily living (ADL) Undesirable effects: Side-effects or Adverse reactions: None reported Monitoring: [place] PMP: Online review of the past 54-monthperiod conducted. Compliant with practice rules and regulations Last UDS on record: Summary  Date Value Ref Range Status  03/24/2017 FINAL  Final    Comment:    ==================================================================== TOXASSURE SELECT 13 (MW) ==================================================================== Test                             Result       Flag       Units Drug Present and Declared for Prescription Verification   Tramadol                       PRESENT      EXPECTED   O-Desmethyltramadol            PRESENT      EXPECTED   N-Desmethyltramadol            PRESENT      EXPECTED    Source of tramadol is a prescription medication.    O-desmethyltramadol and N-desmethyltramadol are expected    metabolites of tramadol. ==================================================================== Test                      Result    Flag   Units      Ref Range   Creatinine              131              mg/dL      >=20 ==================================================================== Declared Medications:  The flagging and interpretation on this report are based on the  following declared medications.  Unexpected results may arise from  inaccuracies in the declared medications.  **Note: The testing scope of this panel includes these medications:  Tramadol  **Note: The testing scope of this panel does not include following  reported medications:  Albuterol  Aspirin (Aspirin 81)  Budenoside (Symbicort)  Docusate  Fenofibrate  Formoterol (Symbicort)  Levothyroxine  Lubiprostone  Meloxicam  Montelukast  Multivitamin  Naloxone  Nitroglycerin  Rosuvastatin  Tiotropium  (Spiriva) ==================================================================== For clinical consultation, please call [phone]. ====================================================================    UDS interpretation: Compliant          Medication Assessment Form: Reviewed. Patient indicates being compliant with therapy Treatment compliance: Compliant Risk Assessment Profile: Aberrant behavior: See prior evaluations. None observed or detected today Comorbid factors increasing risk of overdose: See prior notes. No additional risks detected today Risk of substance use disorder (SUD): Low Opioid Risk Tool - 10/07/17 0928      Family History of Substance Abuse   Alcohol  Positive Female    Illegal Drugs  Negative    Rx Drugs  Negative      Personal History of Substance Abuse   Alcohol  Negative    Illegal Drugs  Negative    Rx Drugs  Negative      Age   Age between 34-45 years   No      Psychological Disease   Psychological Disease  Negative    Depression  Negative      Total Score   Opioid Risk Tool Scoring  1    Opioid Risk Interpretation  Low Risk      ORT Scoring interpretation table:  Score <3 = Low Risk for SUD  Score between 4-7 = Moderate Risk for SUD  Score >8 = High Risk for Opioid Abuse   Risk Mitigation Strategies:  Patient Counseling: Covered Patient-Prescriber Agreement (PPA): Present and active  Notification to other healthcare providers: Done  Pharmacologic Plan: No change in therapy, at this time.             Post-Procedure Assessment  06/05/2017 Procedure:  Diagnostic Suprascapular nerve Block Pre-procedure pain score:  3/10 Post-procedure pain score: 0/10         Influential Factors: BMI: 39.11 kg/m Intra-procedural challenges: None observed.         Assessment challenges: None detected.              Reported side-effects: None.        Post-procedural adverse reactions or complications: None reported         Sedation: Please see  nurses note. When no sedatives are used, the analgesic levels obtained are directly associated to the effectiveness of the local anesthetics. However, when sedation is provided, the level of analgesia obtained during the initial 1 hour following the intervention, is believed to be the result of a combination of factors. These factors may include, but are not limited to: 1. The effectiveness of the local anesthetics used. 2. The effects of the analgesic(s) and/or anxiolytic(s) used. 3. The degree of discomfort experienced by the patient at the time of the procedure. 4. The patients ability and reliability in recalling and recording the events. 5. The presence and influence of possible secondary gains and/or psychosocial factors. Reported result: Relief experienced during the 1st hour after the procedure: 100 % (Ultra-Short Term Relief)            Interpretative annotation: Clinically appropriate result. Analgesia during this period is likely to be Local Anesthetic and/or IV Sedative (Analgesic/Anxiolytic) related.          Effects of local anesthetic: The analgesic effects attained during this period are directly associated  to the localized infiltration of local anesthetics and therefore cary significant diagnostic value as to the etiological location, or anatomical origin, of the pain. Expected duration of relief is directly dependent on the pharmacodynamics of the local anesthetic used. Long-acting (4-6 hours) anesthetics used.  Reported result: Relief during the next 4 to 6 hour after the procedure: 75 % (Short-Term Relief)            Interpretative annotation: Clinically appropriate result. Analgesia during this period is likely to be Local Anesthetic-related.          Long-term benefit: Defined as the period of time past the expected duration of local anesthetics (1 hour for short-acting and 4-6 hours for long-acting). With the possible exception of prolonged sympathetic blockade from the local  anesthetics, benefits during this period are typically attributed to, or associated with, other factors such as analgesic sensory neuropraxia, antiinflammatory effects, or beneficial biochemical changes provided by agents other than the local anesthetics.  Reported result: Extended relief following procedure: 0 %(75 % relief lasted for 1 day) (Long-Term Relief)            Interpretative annotation: Clinically appropriate result. Good relief. No permanent benefit expected. Inflammation plays a part in the etiology to the pain.          Current benefits: Defined as reported results that persistent at this point in time.   Analgesia: 0 %            Function: Back to baseline ROM: Back to baseline Interpretative annotation: Recurrence of symptoms. No permanent benefit expected.             Interpretation: Results would suggest failure of therapy in achieving desired goal(s).                  Plan:  Please see "Plan of Care" for details.                 Laboratory Chemistry  Inflammation Markers (CRP: Acute Phase) (ESR: Chronic Phase) Lab Results  Component Value Date   ESRSEDRATE 11 12/06/2012                         Note: No results found under the [facility] electronic medical record Pt to bring in copy of recent labs  Recent Diagnostic Imaging Results  DG C-Arm 1-60 Min-No Report Fluoroscopy was utilized by the requesting physician.  No radiographic  interpretation.   Complexity Note: Imaging results reviewed. Results shared with Ms. Knepp, using Layman's terms.                         Meds   Current Outpatient Medications:  .  albuterol (PROVENTIL HFA;VENTOLIN HFA) 108 (90 Base) MCG/ACT inhaler, Inhale into the lungs., Disp: , Rfl:  .  budesonide-formoterol (SYMBICORT) 160-4.5 MCG/ACT inhaler, Inhale 2 puffs into the lungs 2 (two) times daily., Disp: , Rfl:  .  fenofibrate (TRICOR) 145 MG tablet, Take 145 mg by mouth daily., Disp: , Rfl:  .  levothyroxine (SYNTHROID,  LEVOTHROID) 125 MCG tablet, Take 125 mcg by mouth daily before breakfast., Disp: , Rfl:  .  lubiprostone (AMITIZA) 24 MCG capsule, Take 24 mcg by mouth 2 (two) times daily with a meal., Disp: , Rfl:  .  montelukast (SINGULAIR) 10 MG tablet, Take by mouth., Disp: , Rfl:  .  naloxone (NARCAN) 0.4 MG/ML injection, Inject 1 mL (0.4 mg total) into the muscle  as needed (for pain medication overdose.). Inject into thigh muscle, then call 911., Disp: 2 mL, Rfl: 0 .  nitroGLYCERIN (NITROSTAT) 0.4 MG SL tablet, TAKE ONE TABLET SUBLINGUALLY EVERY 5 MINUTES X 3 FOR CHEST PAIN, Disp: , Rfl: 0 .  rosuvastatin (CRESTOR) 10 MG tablet, Take 10 mg by mouth daily., Disp: , Rfl:  .  tiotropium (SPIRIVA) 18 MCG inhalation capsule, Place into inhaler and inhale., Disp: , Rfl:  .  topiramate (TOPAMAX) 100 MG tablet, Take 100 mg by mouth 2 (two) times daily., Disp: , Rfl:  .  [START ON 11/08/2017] traMADol (ULTRAM) 50 MG tablet, Take 2 tablets (100 mg total) by mouth every 6 (six) hours as needed for moderate pain or severe pain., Disp: 720 tablet, Rfl: 1  ROS  Constitutional: Denies any fever or chills Gastrointestinal: No reported hemesis, hematochezia, vomiting, or acute GI distress Musculoskeletal: Denies any acute onset joint swelling, redness, loss of ROM, or weakness Neurological: No reported episodes of acute onset apraxia, aphasia, dysarthria, agnosia, amnesia, paralysis, loss of coordination, or loss of consciousness  Allergies  Ms. Rehfeldt has No Known Allergies.  [place]  Drug: Ms. Yankowski  reports that she does not use drugs. Alcohol:  reports that she does not drink alcohol. Tobacco:  reports that  has never smoked. she has never used smokeless tobacco. Medical:  has a past medical history of Arthritis, Displacement of lumbar intervertebral disc (06/04/2015), History of cardiac arrhythmia (06/04/2015), History of cervical spinal surgery (06/04/2015), Hyperlipidemia, Hypothyroidism (06/04/2015), and Thyroid  disease. Surgical: Ms. Overbaugh  has a past surgical history that includes Abdominal hysterectomy; Total shoulder replacement; Replacement total knee; Appendectomy; Lumbar fusion; Carpal tunnel release; and Back surgery (07/25/2016). Family: family history includes Cancer in her mother; Heart disease in her father.  Constitutional Exam  General appearance: Well nourished, well developed, and well hydrated. In no apparent acute distress Vitals:   10/07/17 0916  BP: 123/73  Pulse: 65  Resp: 16  Temp: 98.1 F (36.7 C)  TempSrc: Oral  SpO2: 98%  Weight: 235 lb (106.6 kg)  Height: _0  (1.651 m)  Psych/Mental status: Alert, oriented x 3 (person, place, & time)       Eyes: PERLA Respiratory: No evidence of acute respiratory distress  Cervical Spine Area Exam  Skin & Axial Inspection: No masses, redness, edema, swelling, or associated skin lesions Alignment: Symmetrical Functional ROM: Unrestricted ROM      Stability: No instability detected Muscle Tone/Strength: Functionally intact. No obvious neuro-muscular anomalies detected. Sensory (Neurological): Unimpaired Palpation: No palpable anomalies              Upper Extremity (UE) Exam    Side: Right upper extremity  Side: Left upper extremity  Skin & Extremity Inspection: Skin color, temperature, and hair growth are WNL. No peripheral edema or cyanosis. No masses, redness, swelling, asymmetry, or associated skin lesions. No contractures.  Skin & Extremity Inspection: Skin color, temperature, and hair growth are WNL. No peripheral edema or cyanosis. No masses, redness, swelling, asymmetry, or associated skin lesions. No contractures.  Functional ROM: Unrestricted ROM          Functional ROM: Unrestricted ROM          Muscle Tone/Strength: Functionally intact. No obvious neuro-muscular anomalies detected.  Muscle Tone/Strength: Functionally intact. No obvious neuro-muscular anomalies detected.  Sensory (Neurological): Unimpaired           Sensory (Neurological): Unimpaired          Palpation: No palpable anomalies  Palpation: No palpable anomalies              Specialized Test(s): Deferred         Specialized Test(s): Deferred          Thoracic Spine Area Exam  Skin & Axial Inspection: No masses, redness, or swelling Alignment: Symmetrical Functional ROM: Unrestricted ROM Stability: No instability detected Muscle Tone/Strength: Functionally intact. No obvious neuro-muscular anomalies detected. Sensory (Neurological): Unimpaired Muscle strength & Tone: No palpable anomalies  Lumbar Spine Area Exam  Skin & Axial Inspection: No masses, redness, or swelling Alignment: Symmetrical Functional ROM: Unrestricted ROM      Stability: No instability detected Muscle Tone/Strength: Functionally intact. No obvious neuro-muscular anomalies detected. Sensory (Neurological): Unimpaired Palpation: No palpable anomalies       Provocative Tests: Lumbar Hyperextension and rotation test: evaluation deferred today       Lumbar Lateral bending test: evaluation deferred today       Patrick's Maneuver: evaluation deferred today                    Gait & Posture Assessment  Ambulation: Unassisted Gait: Relatively normal for age and body habitus Posture: WNL   Lower Extremity Exam    Side: Right lower extremity  Side: Left lower extremity  Skin & Extremity Inspection: Skin color, temperature, and hair growth are WNL. No peripheral edema or cyanosis. No masses, redness, swelling, asymmetry, or associated skin lesions. No contractures.  Skin & Extremity Inspection: Skin color, temperature, and hair growth are WNL. No peripheral edema or cyanosis. No masses, redness, swelling, asymmetry, or associated skin lesions. No contractures.  Functional ROM: Unrestricted ROM          Functional ROM: Unrestricted ROM          Muscle Tone/Strength: Functionally intact. No obvious neuro-muscular anomalies detected.  Muscle Tone/Strength:  Functionally intact. No obvious neuro-muscular anomalies detected.  Sensory (Neurological): Unimpaired  Sensory (Neurological): Unimpaired  Palpation: No palpable anomalies  Palpation: No palpable anomalies   Assessment  Primary Diagnosis & Pertinent Problem List: The primary encounter diagnosis was Arthropathy of shoulder (Location of Tertiary source of pain) (Left). Diagnoses of Lumbar spondylosis with radicular symptoms, Chronic pain of left ankle, Chronic pain syndrome, and Therapeutic opioid-induced constipation (OIC) were also pertinent to this visit.  Status Diagnosis  Stable Stable Reoccurring 1. Arthropathy of shoulder (Location of Tertiary source of pain) (Left)   2. Lumbar spondylosis with radicular symptoms   3. Chronic pain of left ankle   4. Chronic pain syndrome   5. Therapeutic opioid-induced constipation (OIC)     Problems updated and reviewed during this visit: Problem  Chronic Pain of Left Ankle   Plan of Care  Pharmacotherapy (Medications Ordered): Meds ordered this encounter  Medications  . traMADol (ULTRAM) 50 MG tablet    Sig: Take 2 tablets (100 mg total) by mouth every 6 (six) hours as needed for moderate pain or severe pain.    Dispense:  720 tablet    Refill:  1    Fill one day early if pharmacy is closed on scheduled refill date.  To last until: 05/07/2018    Order Specific Question:   Supervising Provider    Answer:   Milinda Pointer [phone]   New Prescriptions   No medications on file   Medications administered today: Donne Anon. Snell had no medications administered during this visit. Lab-work, procedure(s), and/or referral(s): No orders of the defined types were placed in this encounter.  Imaging and/or referral(s): None  Interventional therapies: Planned, scheduled, and/or pending:  Not at this time.   Considering:  Diagnostic bilateral suprascapular NB Possiblebilateral suprascapular RFA Diagnostic Left intra-articular  shoulder joint injection Bilateral intra-articular Hip injection Palliative right-sided L3-4 lumbar epiduralsteroid injection Palliative right-sided L4-5 transforaminal epidural steroid injection Palliative right-sided L2-3 lumbar epiduralsteroid injection Palliative right-sided C7-T1 cervical epiduralsteroid injection Diagnostic bilateral cervical facet block Possible bilateral cervical facet radiofrequencyablation Diagnostic bilateral lumbar facet block Possible bilateral lumbar facet radiofrequencyablation   Palliative PRN treatment(s):  Palliative right-sided L3-4 lumbar epiduralsteroid injection Palliative right-sided L4-5 transforaminal epiduralsteroid injection Palliative right-sided L2-3 lumbar epiduralsteroid injection Palliative right-sided C7-T1 cervical epiduralsteroid injection Palliativebilateral shoulder joint injection Palliativebilateral suprascapular nerve block       Provider-requested follow-up: Return in 6 months (on 04/21/2018) for MedMgmt with Me Dionisio David).  Future Appointments  Date Time Provider [place]  10/21/2017 10:00 AM [facility] None  11/12/2017 10:30 AM Allyne Gee, MD [facility] None  04/15/2018 11:00 AM Vevelyn Francois, NP [facility] None   Primary Care Physician: Lenard Simmer, MD Location: [facility] Outpatient [facility] Management Facility Note by: Vevelyn Francois NP Date: 10/07/2017; Time: 1:27 PM  Pain Score Disclaimer: We use the NRS-11 scale. This is a self-reported, subjective measurement of pain severity with only modest accuracy. It is used primarily to identify changes within a particular patient. It must be understood that outpatient pain scales are significantly less accurate that those used for research, where they can be applied under ideal controlled circumstances with minimal exposure to variables. In reality, the score is likely to be a combination of pain intensity and pain affect, where pain  affect describes the degree of emotional arousal or changes in action readiness caused by the sensory experience of pain. Factors such as social and work situation, setting, emotional state, anxiety levels, expectation, and prior pain experience may influence pain perception and show large inter-individual differences that may also be affected by time variables.  Patient instructions provided during this appointment: Patient Instructions    ____________________________________________________________________________________________  Medication Rules  Applies to: All patients receiving prescriptions (written or electronic).  Pharmacy of record: Pharmacy where electronic prescriptions will be sent. If written prescriptions are taken to a different pharmacy, please inform the nursing staff. The pharmacy listed in the electronic medical record should be the one where you would like electronic prescriptions to be sent.  Prescription refills: Only during scheduled appointments. Applies to both, written and electronic prescriptions.  NOTE: The following applies primarily to controlled substances (Opioid* Pain Medications).   Patient's responsibilities: 1. Pain Pills: Bring all pain pills to every appointment (except for procedure appointments). 2. Pill Bottles: Bring pills in original pharmacy bottle. Always bring newest bottle. Bring bottle, even if empty. 3. Medication refills: You are responsible for knowing and keeping track of what medications you need refilled. The day before your appointment, write a list of all prescriptions that need to be refilled. Bring that list to your appointment and give it to the admitting nurse. Prescriptions will be written only during appointments. If you forget a medication, it will not be "Called in", "Faxed", or "electronically sent". You will need to get another appointment to get these prescribed. 4. Prescription Accuracy: You are responsible for carefully  inspecting your prescriptions before leaving our office. Have the discharge nurse carefully go over each prescription with you, before taking them home. Make sure that your name is accurately spelled, that your address is correct. Check the name  and dose of your medication to make sure it is accurate. Check the number of pills, and the written instructions to make sure they are clear and accurate. Make sure that you are given enough medication to last until your next medication refill appointment. 5. Taking Medication: Take medication as prescribed. Never take more pills than instructed. Never take medication more frequently than prescribed. Taking less pills or less frequently is permitted and encouraged, when it comes to controlled substances (written prescriptions).  6. Inform other Doctors: Always inform, all of your healthcare providers, of all the medications you take. 7. Pain Medication from other Providers: You are not allowed to accept any additional pain medication from any other Doctor or Healthcare provider. There are two exceptions to this rule. (see below) In the event that you require additional pain medication, you are responsible for notifying us, as stated below. 8. Medication Agreement: You are responsible for carefully reading and following our Medication Agreement. This must be signed before receiving any prescriptions from our practice. Safely store a copy of your signed Agreement. Violations to the Agreement will result in no further prescriptions. (Additional copies of our Medication Agreement are available upon request.) 9. Laws, Rules, & Regulations: All patients are expected to follow all Federal and [employer], [employer], Rules, [employer]. Ignorance of the Laws does not constitute a valid excuse. The use of any illegal substances is prohibited. 10. Adopted CDC guidelines & recommendations: Target dosing levels will be at or below 60 MME/day. Use of benzodiazepines** is not  recommended.  Exceptions: There are only two exceptions to the rule of not receiving pain medications from other Healthcare Providers. 1. Exception #1 (Emergencies): In the event of an emergency (i.e.: accident requiring emergency care), you are allowed to receive additional pain medication. However, you are responsible for: As soon as you are able, call our office [phone], at any time of the day or night, and leave a message stating your name, the date and nature of the emergency, and the name and dose of the medication prescribed. In the event that your call is answered by a member of our staff, make sure to document and save the date, time, and the name of the person that took your information.  2. Exception #2 (Planned Surgery): In the event that you are scheduled by another doctor or dentist to have any type of surgery or procedure, you are allowed (for a period no longer than 30 days), to receive additional pain medication, for the acute post-op pain. However, in this case, you are responsible for picking up a copy of our "Post-op Pain Management for Surgeons" handout, and giving it to your surgeon or dentist. This document is available at our office, and does not require an appointment to obtain it. Simply go to our office during business hours (Monday-Thursday from 8:00 AM to 4:00 PM) (Friday 8:00 AM to 12:00 Noon) or if you have a scheduled appointment with [place], prior to your surgery, and ask for it by name. In addition, you will need to provide [place] with your name, name of your surgeon, type of surgery, and date of procedure or surgery.  *Opioid medications include: morphine, codeine, oxycodone, oxymorphone, hydrocodone, hydromorphone, meperidine, tramadol, tapentadol, buprenorphine, fentanyl, methadone. **Benzodiazepine medications include: diazepam (Valium), alprazolam (Xanax), clonazepam (Klonopine), lorazepam (Ativan), clorazepate (Tranxene), chlordiazepoxide (Librium), estazolam (Prosom),  oxazepam (Serax), temazepam (Restoril), triazolam (Halcion)  ____________________________________________________________________________________________   BMI Assessment: Estimated body mass index is 39.11 kg/m as calculated from the following:  Height as of this encounter: _0  (1.651 m).   Weight as of this encounter: 235 lb (106.6 kg).  BMI interpretation table: BMI level Category Range association with higher incidence of chronic pain  <18 kg/m2 Underweight   18.5-24.9 kg/m2 Ideal body weight   25-29.9 kg/m2 Overweight Increased incidence by 20%  30-34.9 kg/m2 Obese (Class I) Increased incidence by 68%  35-39.9 kg/m2 Severe obesity (Class II) Increased incidence by 136%  >40 kg/m2 Extreme obesity (Class III) Increased incidence by 254%   BMI Readings from Last 4 Encounters:  10/07/17 39.11 kg/m  06/04/17 39.27 kg/m  05/26/17 39.11 kg/m  05/12/17 39.27 kg/m   Wt Readings from Last 4 Encounters:  10/07/17 235 lb (106.6 kg)  06/04/17 236 lb (107 kg)  05/26/17 235 lb (106.6 kg)  05/12/17 236 lb (107 kg)

## 2017-10-07 NOTE — Patient Instructions (Addendum)
____________________________________________________________________________________________  Medication Rules  Applies to: All patients receiving prescriptions (written or electronic).  Pharmacy of record: Pharmacy where electronic prescriptions will be sent. If written prescriptions are taken to a different pharmacy, please inform the nursing staff. The pharmacy listed in the electronic medical record should be the one where you would like electronic prescriptions to be sent.  Prescription refills: Only during scheduled appointments. Applies to both, written and electronic prescriptions.  NOTE: The following applies primarily to controlled substances (Opioid* Pain Medications).   Patient's responsibilities: 1. Pain Pills: Bring all pain pills to every appointment (except for procedure appointments). 2. Pill Bottles: Bring pills in original pharmacy bottle. Always bring newest bottle. Bring bottle, even if empty. 3. Medication refills: You are responsible for knowing and keeping track of what medications you need refilled. The day before your appointment, write a list of all prescriptions that need to be refilled. Bring that list to your appointment and give it to the admitting nurse. Prescriptions will be written only during appointments. If you forget a medication, it will not be "Called in", "Faxed", or "electronically sent". You will need to get another appointment to get these prescribed. 4. Prescription Accuracy: You are responsible for carefully inspecting your prescriptions before leaving our office. Have the discharge nurse carefully go over each prescription with you, before taking them home. Make sure that your name is accurately spelled, that your address is correct. Check the name and dose of your medication to make sure it is accurate. Check the number of pills, and the written instructions to make sure they are clear and accurate. Make sure that you are given enough medication to  last until your next medication refill appointment. 5. Taking Medication: Take medication as prescribed. Never take more pills than instructed. Never take medication more frequently than prescribed. Taking less pills or less frequently is permitted and encouraged, when it comes to controlled substances (written prescriptions).  6. Inform other Doctors: Always inform, all of your healthcare providers, of all the medications you take. 7. Pain Medication from other Providers: You are not allowed to accept any additional pain medication from any other Doctor or Healthcare provider. There are two exceptions to this rule. (see below) In the event that you require additional pain medication, you are responsible for notifying us, as stated below. 8. Medication Agreement: You are responsible for carefully reading and following our Medication Agreement. This must be signed before receiving any prescriptions from our practice. Safely store a copy of your signed Agreement. Violations to the Agreement will result in no further prescriptions. (Additional copies of our Medication Agreement are available upon request.) 9. Laws, Rules, & Regulations: All patients are expected to follow all Federal and State Laws, Statutes, Rules, & Regulations. Ignorance of the Laws does not constitute a valid excuse. The use of any illegal substances is prohibited. 10. Adopted CDC guidelines & recommendations: Target dosing levels will be at or below 60 MME/day. Use of benzodiazepines** is not recommended.  Exceptions: There are only two exceptions to the rule of not receiving pain medications from other Healthcare Providers. 1. Exception #1 (Emergencies): In the event of an emergency (i.e.: accident requiring emergency care), you are allowed to receive additional pain medication. However, you are responsible for: As soon as you are able, call our office [phone], at any time of the day or night, and leave a message stating your  name, the date and nature of the emergency, and the name and dose of the medication   prescribed. In the event that your call is answered by a member of our staff, make sure to document and save the date, time, and the name of the person that took your information.  2. Exception #2 (Planned Surgery): In the event that you are scheduled by another doctor or dentist to have any type of surgery or procedure, you are allowed (for a period no longer than 30 days), to receive additional pain medication, for the acute post-op pain. However, in this case, you are responsible for picking up a copy of our "Post-op Pain Management for Surgeons" handout, and giving it to your surgeon or dentist. This document is available at our office, and does not require an appointment to obtain it. Simply go to our office during business hours (Monday-Thursday from 8:00 AM to 4:00 PM) (Friday 8:00 AM to 12:00 Noon) or if you have a scheduled appointment with [place], prior to your surgery, and ask for it by name. In addition, you will need to provide [place] with your name, name of your surgeon, type of surgery, and date of procedure or surgery.  *Opioid medications include: morphine, codeine, oxycodone, oxymorphone, hydrocodone, hydromorphone, meperidine, tramadol, tapentadol, buprenorphine, fentanyl, methadone. **Benzodiazepine medications include: diazepam (Valium), alprazolam (Xanax), clonazepam (Klonopine), lorazepam (Ativan), clorazepate (Tranxene), chlordiazepoxide (Librium), estazolam (Prosom), oxazepam (Serax), temazepam (Restoril), triazolam (Halcion)  ____________________________________________________________________________________________   BMI Assessment: Estimated body mass index is 39.11 kg/m as calculated from the following:   Height as of this encounter: 5\' 5"  (1.651 m).   Weight as of this encounter: 235 lb (106.6 kg).  BMI interpretation table: BMI level Category Range association with higher incidence of chronic  pain  <18 kg/m2 Underweight   18.5-24.9 kg/m2 Ideal body weight   25-29.9 kg/m2 Overweight Increased incidence by 20%  30-34.9 kg/m2 Obese (Class I) Increased incidence by 68%  35-39.9 kg/m2 Severe obesity (Class II) Increased incidence by 136%  >40 kg/m2 Extreme obesity (Class III) Increased incidence by 254%   BMI Readings from Last 4 Encounters:  10/07/17 39.11 kg/m  06/04/17 39.27 kg/m  05/26/17 39.11 kg/m  05/12/17 39.27 kg/m   Wt Readings from Last 4 Encounters:  10/07/17 235 lb (106.6 kg)  06/04/17 236 lb (107 kg)  05/26/17 235 lb (106.6 kg)  05/12/17 236 lb (107 kg)

## 2017-10-21 ENCOUNTER — Ambulatory Visit: Payer: Self-pay

## 2017-11-09 ENCOUNTER — Ambulatory Visit: Payer: Medicare Other | Admitting: Pain Medicine

## 2017-11-09 DIAGNOSIS — Z789 Other specified health status: Secondary | ICD-10-CM | POA: Insufficient documentation

## 2017-11-09 DIAGNOSIS — M899 Disorder of bone, unspecified: Secondary | ICD-10-CM | POA: Insufficient documentation

## 2017-11-09 DIAGNOSIS — G8929 Other chronic pain: Secondary | ICD-10-CM | POA: Insufficient documentation

## 2017-11-09 DIAGNOSIS — M25532 Pain in left wrist: Secondary | ICD-10-CM

## 2017-11-09 DIAGNOSIS — Z79899 Other long term (current) drug therapy: Secondary | ICD-10-CM | POA: Insufficient documentation

## 2017-11-09 NOTE — Progress Notes (Deleted)
Patient's Name: Regina Campos  MRN: [id number]  Referring Provider: Lenard Simmer, MD  DOB: [birth year]  PCP: Lenard Simmer, MD  DOS: 11/09/2017  Note by: Gaspar Cola, MD  Service setting: Ambulatory outpatient  Specialty: Interventional Pain Management  Location: [facility] (AMB) Pain Management Facility    Patient type: Established   Primary Reason(s) for Visit: Evaluation of chronic illnesses with exacerbation, or progression (Level of risk: moderate) CC: No chief complaint on file.  HPI  Regina Campos is a 67 y.o. year old, female patient, who comes today for a follow-up evaluation. She has Encounter for therapeutic drug level monitoring; Long term current use of opiate analgesic; Uncomplicated opioid dependence ([place]); Opiate use (40 MME/Day); Chronic neck pain (Location of Primary Source of Pain); Chronic low back pain (Location of Secondary source of pain); Lumbar radicular pain; Lumbar spondylosis with radicular symptoms; Chronic pain syndrome; Pulmonary emphysema ([place]); Atypical migraine; Apnea, sleep; High cholesterol; Hypothyroidism; History of cervical spinal surgery; Cervical foraminal stenosis; Cervicogenic headache; Fibromyalgia; History of cardiac arrhythmia; Depression; Obesity; 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); Cervical central spinal stenosis; Long term prescription opiate use; Myofascial pain; Cervical paraspinal muscle spasm; Cervical spondylosis with radiculopathy (Right side); 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); Opioid-induced constipation ([facility]); 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); Chronic shoulder pain [facility] source of pain) (Bilateral) (L>R); Osteoarthritis; Constipation; COPD (chronic obstructive pulmonary disease) ([place]); Diastolic dysfunction; HLD (hyperlipidemia); Obesity (BMI 30-39.9); Chronic hip pain (Left); Chronic hip pain (Right); 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 Regina Campos was last seen on 06/24/2017. Her primarily concern today is the No chief complaint on file.  Pain Assessment: Location:     Radiating:   Onset:   Duration:   Quality:   Severity:  /10 (self-reported pain score)  Note: Reported level is compatible with observation.                         When using our objective Pain Scale, levels between 6 and 10/10 are said to belong in an emergency room, as it progressively worsens from a 6/10, described as severely limiting, requiring emergency care not usually available at an outpatient pain management facility. At a 6/10 level, communication becomes difficult and requires great effort. Assistance to reach the emergency department may be required. Facial flushing and profuse sweating along with potentially dangerous increases in heart rate and blood pressure will be evident. Effect on ADL:   Timing:   Modifying factors:    Further details on both, my assessment(s), as well as the proposed treatment plan, please see below.  Laboratory Chemistry  Inflammation Markers (CRP: Acute Phase) (ESR: Chronic Phase) Lab Results  Component Value Date   ESRSEDRATE 11 12/06/2012                         Rheumatology Markers No results found for: RF, ANA, LABURIC, URICUR, LYMEIGGIGMAB, LYMEABIGMQN              Renal Function Markers No results found for: BUN, CREATININE, GFRAA, GFRNONAA               Hepatic Function Markers No results  found for: AST, ALT, ALBUMIN, ALKPHOS, HCVAB, AMYLASE, LIPASE, AMMONIA               Electrolytes No results found for: NA, K, CL, CALCIUM, MG, PHOS                      Neuropathy  Markers No results found for: VITAMINB12, FOLATE, HGBA1C, HIV               Bone Pathology Markers No results found for: VD25OH, [id number], [id number], [id number], 25OHVITD1, 25OHVITD2, 25OHVITD3, TESTOFREE, TESTOSTERONE                       Coagulation Parameters No results found for: INR, LABPROT, APTT, PLT, DDIMER               Cardiovascular Markers No results found for: BNP, CKTOTAL, CKMB, TROPONINI, HGB, HCT               CA Markers No results found for: CEA, CA125, LABCA2               Note: Lab results reviewed.  Recent Diagnostic Imaging Review  Cervical Imaging: Cervical MR wo contrast:  Results for orders placed during the hospital encounter of 03/17/17  MR CERVICAL SPINE WO CONTRAST   Narrative CLINICAL DATA:  Neck pain for 1 year.  Bilateral hand weakness.  EXAM: MRI CERVICAL SPINE WITHOUT CONTRAST  TECHNIQUE: Multiplanar, multisequence MR imaging of the cervical spine was performed. No intravenous contrast was administered.  COMPARISON:  Cervical spine MRI 10/15/2012  FINDINGS: Alignment: Physiologic.  Vertebrae: There is C4-C7 ACDF.  No focal marrow abnormality.  Cord: Normal signal and morphology.  Posterior Fossa, vertebral arteries, paraspinal tissues: Visualized posterior fossa is normal. Vertebral artery flow voids are preserved. Normal visualized paraspinal soft tissues.  Disc levels:  C1-C2: Normal.  C2-C3: Normal disc space and facets. No spinal canal or neuroforaminal stenosis.  C3-C4: Bilateral mild uncovertebral hypertrophy with mild right and moderate left neural foraminal stenosis.  C4-C5: Postfusion changes.  No stenosis.  C5-C6: Postfusion changes. Left uncovertebral spurring with mild left foraminal stenosis.  C6-C7: Postfusion changes. Small central disc osteophyte complex without spinal canal stenosis. Mild bilateral uncovertebral spurring with mild narrowing of the left neural foramen.  C7-T1: Normal disc space and  facets. No spinal canal or neuroforaminal stenosis.  IMPRESSION: 1. C4-C7 ACDF without spinal canal stenosis. 2. Mild right and moderate left foraminal stenosis at C3-C4. 3. Mild left foraminal stenosis at C5-C6 and C6-C7 secondary to uncovertebral spurring.   Electronically Signed   By: Ulyses Jarred M.D.   On: 03/17/2017 14:39    Lumbosacral Imaging: Lumbar MR wo contrast:  Results for orders placed during the hospital encounter of 10/04/15  MR Lumbar Spine Wo Contrast   Narrative CLINICAL DATA:  Low back and bilateral upper leg pain for 2 years, worsened over the past year. History of prior lumbar surgery.  EXAM: MRI LUMBAR SPINE WITHOUT CONTRAST  TECHNIQUE: Multiplanar, multisequence MR imaging of the lumbar spine was performed. No intravenous contrast was administered.  COMPARISON:  Plain films lumbar spine 11/04/2013 and MRI lumbar spine 10/15/2012.  FINDINGS: The patient is status post L4-5 fusion as seen on the prior examination. Vertebral body height is maintained. 0.7 cm retrolisthesis L3 on L4 is unchanged since the prior study. Degenerative endplate signal change is seen at L3-4. There is no worrisome marrow lesion. The conus medullaris is normal in signal and position.  Imaged intra-abdominal contents are unremarkable.  T11-12 and T12-L1 are imaged in the sagittal plane only. There is a shallow disc bulge at T11-12 effacing the ventral thecal sac. T12-L1 is unremarkable. The appearance of these levels is unchanged.  L1-2: There is a shallow down turning central disc protrusion without central or foraminal stenosis, unchanged.  L2-3: Facet degenerative disease and a shallow disc bulge does not appear notably changed. The central canal and foramina appear open.  L3-4: There has been progression of disease at this level. The patient has a disc bulge with superimposed foraminal protrusions bilaterally, larger on the right. Facet arthropathy is noted.  There is moderate central canal stenosis. Severe bilateral foraminal narrowing is worse on the right and increased since the prior study.  L4-5: Status post laminectomy and fusion. The central canal and foramina are widely patent.  L5-S1: There is some facet degenerative disease. No disc bulge or protrusion. The central canal and foramina are open. The appearance is unchanged.  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 Foraminal narrowing appears worse on the right. The central canal is mildly narrowed at this level. The appearance of the lumbar spine is otherwise unchanged.  Status post L4-5 discectomy and fusion. The central canal and foramina are widely patent.   Electronically Signed   By: Inge Rise M.D.   On: 10/04/2015 10:15    Complexity Note: Imaging results reviewed. Results shared with Ms. Kubitz, using Layman's terms.                         Meds   Current Outpatient Medications:  .  albuterol (PROVENTIL HFA;VENTOLIN HFA) 108 (90 Base) MCG/ACT inhaler, Inhale into the lungs., Disp: , Rfl:  .  budesonide-formoterol (SYMBICORT) 160-4.5 MCG/ACT inhaler, Inhale 2 puffs into the lungs 2 (two) times daily., Disp: , Rfl:  .  fenofibrate (TRICOR) 145 MG tablet, Take 145 mg by mouth daily., Disp: , Rfl:  .  levothyroxine (SYNTHROID, LEVOTHROID) 125 MCG tablet, Take 125 mcg by mouth daily before breakfast., Disp: , Rfl:  .  lubiprostone (AMITIZA) 24 MCG capsule, Take 24 mcg by mouth 2 (two) times daily with a meal., Disp: , Rfl:  .  montelukast (SINGULAIR) 10 MG tablet, Take by mouth., Disp: , Rfl:  .  naloxone (NARCAN) 0.4 MG/ML injection, Inject 1 mL (0.4 mg total) into the muscle as needed (for pain medication overdose.). Inject into thigh muscle, then call 911., Disp: 2 mL, Rfl: 0 .  nitroGLYCERIN (NITROSTAT) 0.4 MG SL tablet,  TAKE ONE TABLET SUBLINGUALLY EVERY 5 MINUTES X 3 FOR CHEST PAIN, Disp: , Rfl: 0 .  rosuvastatin (CRESTOR) 10 MG tablet, Take 10 mg by mouth daily., Disp: , Rfl:  .  tiotropium (SPIRIVA) 18 MCG inhalation capsule, Place into inhaler and inhale., Disp: , Rfl:  .  topiramate (TOPAMAX) 100 MG tablet, Take 100 mg by mouth 2 (two) times daily., Disp: , Rfl:  .  traMADol (ULTRAM) 50 MG tablet, Take 2 tablets (100 mg total) by mouth every 6 (six) hours as needed for moderate pain or severe pain., Disp: 720 tablet, Rfl: 1  ROS  Constitutional: Denies any fever or chills Gastrointestinal: No reported hemesis, hematochezia, vomiting, or acute GI distress Musculoskeletal: Denies any acute onset joint swelling, redness, loss of ROM, or weakness Neurological: No reported episodes of acute onset apraxia, aphasia, dysarthria, agnosia,  amnesia, paralysis, loss of coordination, or loss of consciousness  Allergies  Ms. Stankiewicz has No Known Allergies.  [place]  Drug: Ms. Dossantos  reports that she does not use drugs. Alcohol:  reports that she does not drink alcohol. Tobacco:  reports that she has never smoked. She has never used smokeless tobacco. Medical:  has a past medical history of Arthritis, Displacement of lumbar intervertebral disc (06/04/2015), History of cardiac arrhythmia (06/04/2015), History of cervical spinal surgery (06/04/2015), Hyperlipidemia, Hypothyroidism (06/04/2015), and Thyroid disease. Surgical: Ms. Mattioli  has a past surgical history that includes Abdominal hysterectomy; Total shoulder replacement; Replacement total knee; Appendectomy; Lumbar fusion; Carpal tunnel release; and Back surgery (07/25/2016). Family: family history includes Cancer in her mother; Heart disease in her father.  Constitutional Exam  General appearance: Well nourished, well developed, and well hydrated. In no apparent acute distress There were no vitals filed for this visit. BMI Assessment: Estimated body mass  index is 39.11 kg/m as calculated from the following:   Height as of 10/07/17: _0  (1.651 m).   Weight as of 10/07/17: 235 lb (106.6 kg).  BMI interpretation table: BMI level Category Range association with higher incidence of chronic pain  <18 kg/m2 Underweight   18.5-24.9 kg/m2 Ideal body weight   25-29.9 kg/m2 Overweight Increased incidence by 20%  30-34.9 kg/m2 Obese (Class I) Increased incidence by 68%  35-39.9 kg/m2 Severe obesity (Class II) Increased incidence by 136%  >40 kg/m2 Extreme obesity (Class III) Increased incidence by 254%   BMI Readings from Last 4 Encounters:  10/07/17 39.11 kg/m  06/04/17 39.27 kg/m  05/26/17 39.11 kg/m  05/12/17 39.27 kg/m   Wt Readings from Last 4 Encounters:  10/07/17 235 lb (106.6 kg)  06/04/17 236 lb (107 kg)  05/26/17 235 lb (106.6 kg)  05/12/17 236 lb (107 kg)  Psych/Mental status: Alert, oriented x 3 (person, place, & time)       Eyes: PERLA Respiratory: No evidence of acute respiratory distress  Cervical Spine Area Exam  Skin & Axial Inspection: No masses, redness, edema, swelling, or associated skin lesions Alignment: Symmetrical Functional ROM: Unrestricted ROM      Stability: No instability detected Muscle Tone/Strength: Functionally intact. No obvious neuro-muscular anomalies detected. Sensory (Neurological): Unimpaired Palpation: No palpable anomalies              Upper Extremity (UE) Exam    Side: Right upper extremity  Side: Left upper extremity  Skin & Extremity Inspection: Skin color, temperature, and hair growth are WNL. No peripheral edema or cyanosis. No masses, redness, swelling, asymmetry, or associated skin lesions. No contractures.  Skin & Extremity Inspection: Skin color, temperature, and hair growth are WNL. No peripheral edema or cyanosis. No masses, redness, swelling, asymmetry, or associated skin lesions. No contractures.  Functional ROM: Unrestricted ROM          Functional ROM: Unrestricted ROM           Muscle Tone/Strength: Functionally intact. No obvious neuro-muscular anomalies detected.  Muscle Tone/Strength: Functionally intact. No obvious neuro-muscular anomalies detected.  Sensory (Neurological): Unimpaired          Sensory (Neurological): Unimpaired          Palpation: No palpable anomalies              Palpation: No palpable anomalies              Specialized Test(s): Deferred         Specialized Test(s): Deferred  Thoracic Spine Area Exam  Skin & Axial Inspection: No masses, redness, or swelling Alignment: Symmetrical Functional ROM: Unrestricted ROM Stability: No instability detected Muscle Tone/Strength: Functionally intact. No obvious neuro-muscular anomalies detected. Sensory (Neurological): Unimpaired Muscle strength & Tone: No palpable anomalies  Lumbar Spine Area Exam  Skin & Axial Inspection: No masses, redness, or swelling Alignment: Symmetrical Functional ROM: Unrestricted ROM      Stability: No instability detected Muscle Tone/Strength: Functionally intact. No obvious neuro-muscular anomalies detected. Sensory (Neurological): Unimpaired Palpation: No palpable anomalies       Provocative Tests: Lumbar Hyperextension and rotation test: evaluation deferred today       Lumbar Lateral bending test: evaluation deferred today       Patrick's Maneuver: evaluation deferred today                    Gait & Posture Assessment  Ambulation: Unassisted Gait: Relatively normal for age and body habitus Posture: WNL   Lower Extremity Exam    Side: Right lower extremity  Side: Left lower extremity  Skin & Extremity Inspection: Skin color, temperature, and hair growth are WNL. No peripheral edema or cyanosis. No masses, redness, swelling, asymmetry, or associated skin lesions. No contractures.  Skin & Extremity Inspection: Skin color, temperature, and hair growth are WNL. No peripheral edema or cyanosis. No masses, redness, swelling, asymmetry, or associated skin  lesions. No contractures.  Functional ROM: Unrestricted ROM          Functional ROM: Unrestricted ROM          Muscle Tone/Strength: Functionally intact. No obvious neuro-muscular anomalies detected.  Muscle Tone/Strength: Functionally intact. No obvious neuro-muscular anomalies detected.  Sensory (Neurological): Unimpaired  Sensory (Neurological): Unimpaired  Palpation: No palpable anomalies  Palpation: No palpable anomalies   Assessment  Primary Diagnosis & Pertinent Problem List: The primary encounter diagnosis was Chronic ankle pain (Left). Diagnoses of Chronic wrist pain (Left), Chronic pain syndrome, Disorder of skeletal system, Pharmacologic therapy, and Problems influencing health status were also pertinent to this visit.  Status Diagnosis  Controlled Controlled Controlled 1. Chronic ankle pain (Left)   2. Chronic wrist pain (Left)   3. Chronic pain syndrome   4. Disorder of skeletal system   5. Pharmacologic therapy   6. Problems influencing health status     Problems updated and reviewed during this visit: Problem  Chronic wrist pain (Left)  Chronic ankle pain (Left)  Disorder of Skeletal System  Pharmacologic Therapy  Problems Influencing Health Status   Plan of Care  Pharmacotherapy (Medications Ordered): No orders of the defined types were placed in this encounter.  Medications administered today: Donne Anon. Asper had no medications administered during this visit.  Procedure Orders    No procedure(s) ordered today   Lab Orders  No laboratory test(s) ordered today   Imaging Orders  No imaging studies ordered today   Referral Orders  No referral(s) requested today    Interventional management options: Planned, scheduled, and/or pending:   ***   Considering:   Diagnostic bilateral suprascapular NB Possible bilateral suprascapular RFA Diagnostic Left intra-articular shoulder joint injection Bilateral intra-articular Hip injection Palliative  right-sided L3-4 lumbar epidural steroid injection Palliative right-sided L4-5 transforaminal epidural steroid injection Palliative right-sided L2-3 lumbar epidural steroid injection Palliative right-sided C7-T1 cervical epidural steroid injection Diagnostic bilateral cervical facet block Possible bilateral cervical facet radiofrequency ablation Diagnostic bilateral lumbar facet block Possible bilateral lumbar facet radiofrequency ablation   Palliative PRN treatment(s):   None at this time  Provider-requested follow-up: No follow-ups on file.  Future Appointments  Date Time Provider [place]  11/09/2017  8:45 AM Milinda Pointer, MD [facility] None  11/12/2017 10:30 AM Allyne Gee, MD [facility] None  04/15/2018 11:00 AM Vevelyn Francois, NP [facility] None   Primary Care Physician: Lenard Simmer, MD Location: [facility] Note by: Gaspar Cola, MD Date: 11/09/2017; Time: 6:34 AM

## 2017-11-12 ENCOUNTER — Encounter: Payer: Self-pay | Admitting: Internal Medicine

## 2017-11-12 ENCOUNTER — Ambulatory Visit (INDEPENDENT_AMBULATORY_CARE_PROVIDER_SITE_OTHER): Payer: Medicare Other | Admitting: Internal Medicine

## 2017-11-12 VITALS — BP 123/66 | HR 59 | Resp 16 | Ht 65.0 in | Wt 234.2 lb

## 2017-11-12 DIAGNOSIS — Z9989 Dependence on other enabling machines and devices: Secondary | ICD-10-CM | POA: Diagnosis not present

## 2017-11-12 DIAGNOSIS — J449 Chronic obstructive pulmonary disease, unspecified: Secondary | ICD-10-CM

## 2017-11-12 DIAGNOSIS — G4733 Obstructive sleep apnea (adult) (pediatric): Secondary | ICD-10-CM

## 2017-11-12 DIAGNOSIS — J301 Allergic rhinitis due to pollen: Secondary | ICD-10-CM | POA: Diagnosis not present

## 2017-11-12 NOTE — Progress Notes (Signed)
[facility] [place], [place] [address]  Pulmonary Sleep Medicine  Office Visit Note  Patient Name: Regina Campos DOB: 08/30/1950 MRN [id number]  Date of Service: 11/12/2017  Complaints/HPI:  At this time patient is doing fairly well.  She has been having some issues with seasonal allergies.  She says at the other day her allergies were so bad that she could hardly see.  She states she has been on allergy shots in the distant past currently is not taking hypoxia.  She has not been tested for any follow-up as far as her allergies are concerned.  Right now she is comfortable she is not having any issues with breathing.  ROS  General: (-) fever, (-) chills, (-) night sweats, (-) weakness Skin: (-) rashes, (-) itching,. Eyes: (-) visual changes, (-) redness, (-) itching. Nose and Sinuses: (-) nasal stuffiness or itchiness, (-) postnasal drip, (-) nosebleeds, (-) sinus trouble. Mouth and Throat: (-) sore throat, (-) hoarseness. Neck: (-) swollen glands, (-) enlarged thyroid, (-) neck pain. Respiratory: - cough, (-) bloody sputum, - shortness of breath, - wheezing. Cardiovascular: - ankle swelling, (-) chest pain. Lymphatic: (-) lymph node enlargement. Neurologic: (-) numbness, (-) tingling. Psychiatric: (-) anxiety, (-) depression   Current Medication: Outpatient Encounter Medications as of 11/12/2017  Medication Sig  . albuterol (PROVENTIL HFA;VENTOLIN HFA) 108 (90 Base) MCG/ACT inhaler Inhale into the lungs.  . budesonide-formoterol (SYMBICORT) 160-4.5 MCG/ACT inhaler Inhale 2 puffs into the lungs 2 (two) times daily.  . fenofibrate (TRICOR) 145 MG tablet Take 145 mg by mouth daily.  Marland Kitchen levothyroxine (SYNTHROID, LEVOTHROID) 125 MCG tablet Take 125 mcg by mouth daily before breakfast.  . lubiprostone (AMITIZA) 24 MCG capsule Take 24 mcg by mouth 2 (two) times daily with a meal.  . montelukast (SINGULAIR) 10 MG tablet Take by mouth.  . naloxone (NARCAN) 0.4  MG/ML injection Inject 1 mL (0.4 mg total) into the muscle as needed (for pain medication overdose.). Inject into thigh muscle, then call 911.  . nitroGLYCERIN (NITROSTAT) 0.4 MG SL tablet TAKE ONE TABLET SUBLINGUALLY EVERY 5 MINUTES X 3 FOR CHEST PAIN  . rosuvastatin (CRESTOR) 10 MG tablet Take 10 mg by mouth daily.  Marland Kitchen tiotropium (SPIRIVA) 18 MCG inhalation capsule Place into inhaler and inhale.  . topiramate (TOPAMAX) 100 MG tablet Take 100 mg by mouth 2 (two) times daily.  . traMADol (ULTRAM) 50 MG tablet Take 2 tablets (100 mg total) by mouth every 6 (six) hours as needed for moderate pain or severe pain.   No facility-administered encounter medications on file as of 11/12/2017.     Surgical History: Past Surgical History:  Procedure Laterality Date  . ABDOMINAL HYSTERECTOMY    . APPENDECTOMY    . BACK SURGERY  07/25/2016  . CARPAL TUNNEL RELEASE    . LUMBAR FUSION    . REPLACEMENT TOTAL KNEE     right and left  . TOTAL SHOULDER REPLACEMENT      Medical History: Past Medical History:  Diagnosis Date  . Arthritis   . Displacement of lumbar intervertebral disc 06/04/2015  . History of cardiac arrhythmia 06/04/2015  . History of cervical spinal surgery 06/04/2015  . Hyperlipidemia   . Hypothyroidism 06/04/2015  . Thyroid disease     Family History: Family History  Problem Relation Age of Onset  . Cancer Mother   . Heart disease Father     Social History: Social History   Socioeconomic History  . Marital status: Widowed  Spouse name: Not on file  . Number of children: Not on file  . Years of education: Not on file  . Highest education level: Not on file  Occupational History  . Not on file  Social Needs  . Financial resource strain: Not on file  . Food insecurity:    Worry: Not on file    Inability: Not on file  . Transportation needs:    Medical: Not on file    Non-medical: Not on file  Tobacco Use  . Smoking status: Never Smoker  . Smokeless tobacco:  Never Used  Substance and Sexual Activity  . Alcohol use: No    Alcohol/week: 0.0 oz  . Drug use: No  . Sexual activity: Not on file  Lifestyle  . Physical activity:    Days per week: Not on file    Minutes per session: Not on file  . Stress: Not on file  Relationships  . Social connections:    Talks on phone: Not on file    Gets together: Not on file    Attends religious service: Not on file    Active member of club or organization: Not on file    Attends meetings of clubs or organizations: Not on file    Relationship status: Not on file  . Intimate partner violence:    Fear of current or ex partner: Not on file    Emotionally abused: Not on file    Physically abused: Not on file    Forced sexual activity: Not on file  Other Topics Concern  . Not on file  Social History Narrative  . Not on file    Vital Signs: Blood pressure 123/66, pulse (!) 59, resp. rate 16, height 5\' 5"  (1.651 m), weight 234 lb 3.2 oz (106.2 kg), SpO2 96 %.  Examination: General Appearance: The patient is well-developed, well-nourished, and in no distress. Skin: Gross inspection of skin unremarkable. Head: normocephalic, no gross deformities. Eyes: no gross deformities noted. ENT: ears appear grossly normal no exudates. Neck: Supple. No thyromegaly. No LAD. Respiratory: no rhonchi noted. Cardiovascular: Normal S1 and S2 without murmur or rub. Extremities: No cyanosis. pulses are equal. Neurologic: Alert and oriented. No involuntary movements.  LABS: No results found for this or any previous visit (from the past 2160 hour(s)).  Radiology: Dg C-arm 1-60 Min-no Report  Result Date: 06/04/2017 Fluoroscopy was utilized by the requesting physician.  No radiographic interpretation.    No results found.  No results found.    Assessment and Plan: Patient Active Problem List   Diagnosis Date Noted  . Disorder of skeletal system 11/09/2017  . Chronic wrist pain (Left) 11/09/2017  .  Pharmacologic therapy 11/09/2017  . Problems influencing health status 11/09/2017  . Chronic ankle pain (Left) 10/07/2017  . Trigger finger, right ring finger 03/24/2017  . S/P shoulder replacement (Right) 11/05/2016  . 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 Arthropathy of shoulder (Location of Tertiary source of pain) (Left) 11/05/2016  . Osteoarthritis 09/25/2016  . COPD (chronic obstructive pulmonary disease) ([place]) 09/25/2016  . Chronic hip pain (Left) 09/25/2016  . Chronic hip pain (Right) 09/25/2016  . 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 Diastolic dysfunction 57/84/6962  . Constipation 07/16/2016  . HLD (hyperlipidemia) 07/16/2016  . Obesity (BMI 30-39.9) 07/16/2016  . Chronic shoulder pain [facility] source of pain) (Bilateral) (L>R) 02/11/2016  . 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 Opioid-induced constipation (OIC) 09/18/2015  . 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 Myofascial pain 07/02/2015  . Cervical paraspinal muscle spasm 07/02/2015  . Long term prescription opiate use 06/12/2015  . Encounter for therapeutic drug level monitoring 06/04/2015  . Long term current use of opiate analgesic 06/04/2015  . Uncomplicated opioid dependence ([place]) 06/04/2015  . Opiate use (40 MME/Day) 06/04/2015  . Chronic neck pain (Location of Primary Source of Pain) 06/04/2015  . Chronic low back pain (Location of Secondary source of pain) 06/04/2015  . Lumbar radicular pain 06/04/2015  . Lumbar spondylosis with radicular symptoms 06/04/2015  . Chronic pain syndrome 06/04/2015  . Pulmonary emphysema ([employer]) 06/04/2015  . Apnea, sleep 06/04/2015  . High cholesterol 06/04/2015  . Hypothyroidism 06/04/2015  . History of cervical spinal surgery 06/04/2015  . Cervical foraminal stenosis 06/04/2015  . Cervicogenic headache  06/04/2015  . Fibromyalgia 06/04/2015  . History of cardiac arrhythmia 06/04/2015  . Depression 06/04/2015  . Obesity 06/04/2015  . 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 Cervical central spinal stenosis 06/04/2015  . 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 COPD  Last pulmonary function was reviewed she seems to be doing fairly well at baseline right now she continues to use her inhalers as prescribed.  She states that she gets her inhalers from primary care physician. 2. Sleep Apnea  Currently is on CPAP seems to be tolerating it well.  She will continue with CPAP on the current pressure is 3. Obesity 2 she has to work on weight loss discussed diet and exercise with her 4. Allergic rhinitis once again as noted above we will schedule her for allergy evaluation.  We will continue with supportive care  General Counseling: I have discussed the findings of the evaluation and examination with [facility].  I have also discussed any further diagnostic evaluation thatmay be needed or ordered today. Kalyse verbalizes understanding of the findings of todays visit. We also reviewed her medications today and discussed drug interactions and side effects including but not limited excessive drowsiness and altered mental states. We also discussed that there is always a risk not just to her but also people around her. she has been encouraged to call the office with any questions or concerns that should arise related to todays visit.    Time spent: 65min  I have personally obtained a history, examined the patient, evaluated laboratory and imaging results, formulated the assessment and plan and placed orders.    Allyne Gee, MD [facility] Sleep medicine

## 2017-11-12 NOTE — Patient Instructions (Signed)

## 2017-11-25 ENCOUNTER — Other Ambulatory Visit: Payer: Self-pay

## 2017-11-25 ENCOUNTER — Encounter: Payer: Self-pay | Admitting: Pain Medicine

## 2017-11-25 ENCOUNTER — Ambulatory Visit: Payer: Medicare Other | Attending: Pain Medicine | Admitting: Pain Medicine

## 2017-11-25 VITALS — BP 143/73 | HR 73 | Temp 98.1°F | Resp 18 | Ht 65.0 in | Wt 234.0 lb

## 2017-11-25 DIAGNOSIS — M542 Cervicalgia: Secondary | ICD-10-CM | POA: Diagnosis not present

## 2017-11-25 DIAGNOSIS — M25571 Pain in right ankle and joints of right foot: Secondary | ICD-10-CM | POA: Diagnosis not present

## 2017-11-25 DIAGNOSIS — M25511 Pain in right shoulder: Secondary | ICD-10-CM | POA: Diagnosis not present

## 2017-11-25 DIAGNOSIS — M19012 Primary osteoarthritis, left shoulder: Secondary | ICD-10-CM

## 2017-11-25 DIAGNOSIS — G8929 Other chronic pain: Secondary | ICD-10-CM

## 2017-11-25 DIAGNOSIS — G894 Chronic pain syndrome: Secondary | ICD-10-CM | POA: Diagnosis not present

## 2017-11-25 DIAGNOSIS — M79672 Pain in left foot: Secondary | ICD-10-CM | POA: Insufficient documentation

## 2017-11-25 DIAGNOSIS — Z79891 Long term (current) use of opiate analgesic: Secondary | ICD-10-CM | POA: Diagnosis not present

## 2017-11-25 DIAGNOSIS — M5442 Lumbago with sciatica, left side: Secondary | ICD-10-CM | POA: Diagnosis not present

## 2017-11-25 DIAGNOSIS — M961 Postlaminectomy syndrome, not elsewhere classified: Secondary | ICD-10-CM

## 2017-11-25 DIAGNOSIS — M5441 Lumbago with sciatica, right side: Secondary | ICD-10-CM

## 2017-11-25 DIAGNOSIS — M4802 Spinal stenosis, cervical region: Secondary | ICD-10-CM | POA: Insufficient documentation

## 2017-11-25 DIAGNOSIS — M5126 Other intervertebral disc displacement, lumbar region: Secondary | ICD-10-CM | POA: Diagnosis not present

## 2017-11-25 DIAGNOSIS — M25572 Pain in left ankle and joints of left foot: Secondary | ICD-10-CM | POA: Diagnosis not present

## 2017-11-25 DIAGNOSIS — Z79899 Other long term (current) drug therapy: Secondary | ICD-10-CM | POA: Diagnosis not present

## 2017-11-25 DIAGNOSIS — Z9889 Other specified postprocedural states: Secondary | ICD-10-CM | POA: Diagnosis not present

## 2017-11-25 DIAGNOSIS — M25512 Pain in left shoulder: Secondary | ICD-10-CM

## 2017-11-25 DIAGNOSIS — Z5181 Encounter for therapeutic drug level monitoring: Secondary | ICD-10-CM | POA: Insufficient documentation

## 2017-11-25 DIAGNOSIS — M25579 Pain in unspecified ankle and joints of unspecified foot: Secondary | ICD-10-CM | POA: Insufficient documentation

## 2017-11-25 DIAGNOSIS — M79671 Pain in right foot: Secondary | ICD-10-CM | POA: Diagnosis not present

## 2017-11-25 DIAGNOSIS — M479 Spondylosis, unspecified: Secondary | ICD-10-CM | POA: Insufficient documentation

## 2017-11-25 NOTE — Progress Notes (Addendum)
Patient's Name: Regina Campos  MRN: [id number]  Referring Provider: Lenard Simmer, MD  DOB: [birth year]  PCP: Lenard Simmer, MD  DOS: 11/25/2017  Note by: Gaspar Cola, MD  Service setting: Ambulatory outpatient  Specialty: Interventional Pain Management  Location: [facility] (AMB) Pain Management Facility    Patient type: Established   Primary Reason(s) for Visit: Evaluation of chronic illnesses with exacerbation, or progression (Level of risk: moderate) CC: Foot Pain (both feet, also the left wrist.)  HPI  Regina Campos is a 67 y.o. year old, female patient, who comes today for a follow-up evaluation. She has Encounter for therapeutic drug level monitoring; Long term current use of opiate analgesic; Uncomplicated opioid dependence ([place]); Opiate use (40 MME/Day); Chronic neck pain (Primary Source of Pain); Chronic low back pain (Secondary source of pain); Lumbar radicular pain; Lumbar spondylosis with radicular symptoms; Chronic pain syndrome; Pulmonary emphysema ([place]); Atypical migraine; Apnea, sleep; High cholesterol; Hypothyroidism; History of cervical spinal surgery; Cervical foraminal stenosis; Cervicogenic headache; Fibromyalgia; History of cardiac arrhythmia; Depression; Obesity; 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); Cervical central spinal stenosis; Long term prescription opiate use; Myofascial pain; Cervical paraspinal muscle spasm; Cervical spondylosis with radiculopathy (Right side); 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); Opioid-induced constipation ([facility]); 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); Chronic shoulder pain [facility] source of pain) (Bilateral) (L>R); Osteoarthritis; Constipation; COPD (chronic obstructive pulmonary disease) ([place]); Diastolic dysfunction; HLD (hyperlipidemia); Obesity (BMI 30-39.9); Chronic hip pain (Left); Chronic hip pain (Right); 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 Regina Campos was last seen on 06/24/2017. Her primarily concern today is the Foot Pain (both feet, also the left wrist.)  Pain Assessment: Location: Right, Left Foot Onset: More than a month ago Duration: Chronic pain Quality: Aching Severity: 7 /10 (self-reported pain score)  Note: Reported level is inconsistent with clinical observations. Clinically the patient looks like a 2/10 A 2/10 is viewed as "Mild to Moderate" and described as noticeable and distracting. Impossible to hide from other people. More frequent flare-ups. Still possible to adapt and function close to normal. It can be very annoying and may have occasional stronger flare-ups. With discipline, patients may get used to it and adapt. Regina Campos does not seem to understand the use of our objective pain scale When using our objective Pain Scale, levels between 6 and 10/10 are said to belong in an emergency room, as it progressively worsens from a 6/10, described as severely limiting, requiring emergency care not usually available at an outpatient pain management facility. At a 6/10 level, communication becomes difficult and requires great effort. Assistance to reach the emergency department may be required. Facial flushing and profuse sweating along with potentially dangerous increases in heart rate and blood pressure will be evident. Effect on ADL: Not able to do as much as usual. Timing: Constant Modifying factors: laying down   Today the patient comes in complaining primarily of bilateral foot and ankle pain.  The patient indicates that this is  not really new, but now has been there for longer than 3 months and she indicates that nobody has really told her what she has.  She comes in today  to have this evaluated.  Further details on both, my assessment(s), as well as the proposed treatment plan, please see below.  Laboratory Chemistry  Inflammation Markers (CRP: Acute Phase) (ESR: Chronic Phase) Lab Results  Component Value Date   ESRSEDRATE 11 12/06/2012                         Rheumatology Markers No results found.  Renal Function Markers No results found.  Hepatic Function Markers No results found.  Electrolytes No results found.  Neuropathy Markers No results found.  Bone Pathology Markers No results found.  Coagulation Parameters No results found.  Cardiovascular Markers No results found.  CA Markers No results found.  Note: No results found under the [place] medical record  Recent Diagnostic Imaging Review  Cervical Imaging: Cervical MR wo contrast:  Results for orders placed during the hospital encounter of 03/17/17  MR CERVICAL SPINE WO CONTRAST   Narrative CLINICAL DATA:  Neck pain for 1 year.  Bilateral hand weakness.  EXAM: MRI CERVICAL SPINE WITHOUT CONTRAST  TECHNIQUE: Multiplanar, multisequence MR imaging of the cervical spine was performed. No intravenous contrast was administered.  COMPARISON:  Cervical spine MRI 10/15/2012  FINDINGS: Alignment: Physiologic.  Vertebrae: There is C4-C7 ACDF.  No focal marrow abnormality.  Cord: Normal signal and morphology.  Posterior Fossa, vertebral arteries, paraspinal tissues: Visualized posterior fossa is normal. Vertebral artery flow voids are preserved. Normal visualized paraspinal soft tissues.  Disc levels:  C1-C2: Normal.  C2-C3: Normal disc space and facets. No spinal canal or neuroforaminal stenosis.  C3-C4: Bilateral mild uncovertebral hypertrophy with mild right and moderate left neural foraminal  stenosis.  C4-C5: Postfusion changes.  No stenosis.  C5-C6: Postfusion changes. Left uncovertebral spurring with mild left foraminal stenosis.  C6-C7: Postfusion changes. Small central disc osteophyte complex without spinal canal stenosis. Mild bilateral uncovertebral spurring with mild narrowing of the left neural foramen.  C7-T1: Normal disc space and facets. No spinal canal or neuroforaminal stenosis.  IMPRESSION: 1. C4-C7 ACDF without spinal canal stenosis. 2. Mild right and moderate left foraminal stenosis at C3-C4. 3. Mild left foraminal stenosis at C5-C6 and C6-C7 secondary to uncovertebral spurring.   Electronically Signed   By: Ulyses Jarred M.D.   On: 03/17/2017 14:39    Lumbosacral Imaging: Lumbar MR wo contrast:  Results for orders placed during the hospital encounter of 10/04/15  MR Lumbar Spine Wo Contrast   Narrative CLINICAL DATA:  Low back and bilateral upper leg pain for 2 years, worsened over the past year. History of prior lumbar surgery.  EXAM: MRI LUMBAR SPINE WITHOUT CONTRAST  TECHNIQUE: Multiplanar, multisequence MR imaging of the lumbar spine was performed. No intravenous contrast was administered.  COMPARISON:  Plain films lumbar spine 11/04/2013 and MRI lumbar spine 10/15/2012.  FINDINGS: The patient is status post L4-5 fusion as seen on the prior examination. Vertebral body height is maintained. 0.7 cm retrolisthesis L3 on L4 is unchanged since the prior study. Degenerative endplate signal change is seen at L3-4. There is no worrisome marrow lesion. The conus medullaris is normal in signal and position. Imaged intra-abdominal contents are unremarkable.  T11-12 and T12-L1 are imaged in the sagittal plane only. There is a shallow disc bulge at T11-12 effacing the ventral thecal sac. T12-L1 is unremarkable. The appearance of these levels is unchanged.  L1-2: There is a shallow down turning central disc protrusion without central or  foraminal stenosis, unchanged.  L2-3: Facet degenerative disease  and a shallow disc bulge does not appear notably changed. The central canal and foramina appear open.  L3-4: There has been progression of disease at this level. The patient has a disc bulge with superimposed foraminal protrusions bilaterally, larger on the right. Facet arthropathy is noted. There is moderate central canal stenosis. Severe bilateral foraminal narrowing is worse on the right and increased since the prior study.  L4-5: Status post laminectomy and fusion. The central canal and foramina are widely patent.  L5-S1: There is some facet degenerative disease. No disc bulge or protrusion. The central canal and foramina are open. The appearance is unchanged.  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 Foraminal narrowing appears worse on the right. The central canal is mildly narrowed at this level. The appearance of the lumbar spine is otherwise unchanged.  Status post L4-5 discectomy and fusion. The central canal and foramina are widely patent.   Electronically Signed   By: Inge Rise M.D.   On: 10/04/2015 10:15    Complexity Note: Imaging results reviewed. Results shared with Regina Campos, using Layman's terms.                         Meds   Current Outpatient Medications:  .  albuterol (PROVENTIL HFA;VENTOLIN HFA) 108 (90 Base) MCG/ACT inhaler, Inhale into the lungs., Disp: , Rfl:  .  budesonide-formoterol (SYMBICORT) 160-4.5 MCG/ACT inhaler, Inhale 2 puffs into the lungs 2 (two) times daily., Disp: , Rfl:  .  ergocalciferol (VITAMIN D2) 50000 units capsule, Take 50,000 Units by mouth once a week., Disp: , Rfl:  .  fenofibrate (TRICOR) 145 MG tablet, Take 145 mg by mouth daily., Disp: , Rfl:  .  levothyroxine (SYNTHROID, LEVOTHROID) 125 MCG tablet, Take 125  mcg by mouth daily before breakfast., Disp: , Rfl:  .  lubiprostone (AMITIZA) 24 MCG capsule, Take 24 mcg by mouth 2 (two) times daily with a meal., Disp: , Rfl:  .  montelukast (SINGULAIR) 10 MG tablet, Take by mouth., Disp: , Rfl:  .  naloxone (NARCAN) 0.4 MG/ML injection, Inject 1 mL (0.4 mg total) into the muscle as needed (for pain medication overdose.). Inject into thigh muscle, then call 911., Disp: 2 mL, Rfl: 0 .  nitroGLYCERIN (NITROSTAT) 0.4 MG SL tablet, TAKE ONE TABLET SUBLINGUALLY EVERY 5 MINUTES X 3 FOR CHEST PAIN, Disp: , Rfl: 0 .  rosuvastatin (CRESTOR) 10 MG tablet, Take 10 mg by mouth daily., Disp: , Rfl:  .  tiotropium (SPIRIVA) 18 MCG inhalation capsule, Place into inhaler and inhale., Disp: , Rfl:  .  topiramate (TOPAMAX) 100 MG tablet, Take 100 mg by mouth 2 (two) times daily., Disp: , Rfl:  .  traMADol (ULTRAM) 50 MG tablet, Take 2 tablets (100 mg total) by mouth every 6 (six) hours as needed for moderate pain or severe pain., Disp: 720 tablet, Rfl: 1  ROS  Constitutional: Denies any fever or chills Gastrointestinal: No reported hemesis, hematochezia, vomiting, or acute GI distress Musculoskeletal: Denies any acute onset joint swelling, redness, loss of ROM, or weakness Neurological: No reported episodes of acute onset apraxia, aphasia, dysarthria, agnosia, amnesia, paralysis, loss of coordination, or loss of consciousness  Allergies  Regina Campos has No Known Allergies.  [place]  Drug: Regina Campos  reports that she does not use drugs. Alcohol:  reports that she does not drink alcohol. Tobacco:  reports that she has never smoked. She has never used smokeless tobacco. Medical:  has a past medical history of Arthritis, Displacement of lumbar intervertebral disc (06/04/2015), History of cardiac arrhythmia (06/04/2015), History of cervical spinal surgery (06/04/2015), Hyperlipidemia, Hypothyroidism (06/04/2015), and Thyroid disease. Surgical: Regina Campos  has a past surgical  history that includes Abdominal hysterectomy; Total shoulder replacement; Replacement total knee; Appendectomy; Lumbar fusion; Carpal tunnel release; and Back surgery (07/25/2016). Family: family history includes Cancer in her mother; Heart disease in her father.  Constitutional Exam  General appearance: Well nourished, well developed, and well hydrated. In no apparent acute distress Vitals:   11/25/17 0952  BP: (!) 143/73  Pulse: 73  Resp: 18  Temp: 98.1 F (36.7 C)  TempSrc: Oral  SpO2: 93%  Weight: 234 lb (106.1 kg)  Height: '5\' 5"'  (1.651 m)   BMI Assessment: Estimated body mass index is 38.94 kg/m as calculated from the following:   Height as of this encounter: '5\' 5"'  (1.651 m).   Weight as of this encounter: 234 lb (106.1 kg).  BMI interpretation table: BMI level Category Range association with higher incidence of chronic pain  <18 kg/m2 Underweight   18.5-24.9 kg/m2 Ideal body weight   25-29.9 kg/m2 Overweight Increased incidence by 20%  30-34.9 kg/m2 Obese (Class I) Increased incidence by 68%  35-39.9 kg/m2 Severe obesity (Class II) Increased incidence by 136%  >40 kg/m2 Extreme obesity (Class III) Increased incidence by 254%   BMI Readings from Last 4 Encounters:  11/25/17 38.94 kg/m  11/12/17 38.97 kg/m  10/07/17 39.11 kg/m  06/04/17 39.27 kg/m   Wt Readings from Last 4 Encounters:  11/25/17 234 lb (106.1 kg)  11/12/17 234 lb 3.2 oz (106.2 kg)  10/07/17 235 lb (106.6 kg)  06/04/17 236 lb (107 kg)  Psych/Mental status: Alert, oriented x 3 (person, place, & time)       Eyes: PERLA Respiratory: No evidence of acute respiratory distress  Cervical Spine Area Exam  Skin & Axial Inspection: No masses, redness, edema, swelling, or associated skin lesions Alignment: Symmetrical Functional ROM: Unrestricted ROM      Stability: No instability detected Muscle Tone/Strength: Functionally intact. No obvious neuro-muscular anomalies detected. Sensory (Neurological):  Unimpaired Palpation: No palpable anomalies              Upper Extremity (UE) Exam    Side: Right upper extremity  Side: Left upper extremity  Skin & Extremity Inspection: Skin color, temperature, and hair growth are WNL. No peripheral edema or cyanosis. No masses, redness, swelling, asymmetry, or associated skin lesions. No contractures.  Skin & Extremity Inspection: Skin color, temperature, and hair growth are WNL. No peripheral edema or cyanosis. No masses, redness, swelling, asymmetry, or associated skin lesions. No contractures.  Functional ROM: Unrestricted ROM          Functional ROM: Unrestricted ROM          Muscle Tone/Strength: Functionally intact. No obvious neuro-muscular anomalies detected.  Muscle Tone/Strength: Functionally intact. No obvious neuro-muscular anomalies detected.  Sensory (Neurological): Unimpaired          Sensory (Neurological): Unimpaired          Palpation: No palpable anomalies              Palpation: No palpable anomalies              Specialized Test(s): Deferred         Specialized Test(s): Deferred          Thoracic  Spine Area Exam  Skin & Axial Inspection: No masses, redness, or swelling Alignment: Symmetrical Functional ROM: Unrestricted ROM Stability: No instability detected Muscle Tone/Strength: Functionally intact. No obvious neuro-muscular anomalies detected. Sensory (Neurological): Unimpaired Muscle strength & Tone: No palpable anomalies  Lumbar Spine Area Exam  Skin & Axial Inspection: No masses, redness, or swelling Alignment: Symmetrical Functional ROM: Unrestricted ROM      Stability: No instability detected Muscle Tone/Strength: Functionally intact. No obvious neuro-muscular anomalies detected. Sensory (Neurological): Unimpaired Palpation: No palpable anomalies       Provocative Tests: Lumbar Hyperextension and rotation test: evaluation deferred today       Lumbar Lateral bending test: evaluation deferred today       Patrick's  Maneuver: evaluation deferred today                    Gait & Posture Assessment  Ambulation: Unassisted Gait: Relatively normal for age and body habitus Posture: WNL   Lower Extremity Exam    Side: Right lower extremity  Side: Left lower extremity  Skin & Extremity Inspection: Skin color, temperature, and hair growth are WNL. No peripheral edema or cyanosis. No masses, redness, swelling, asymmetry, or associated skin lesions. No contractures.  Skin & Extremity Inspection: Skin color, temperature, and hair growth are WNL. No peripheral edema or cyanosis. No masses, redness, swelling, asymmetry, or associated skin lesions. No contractures.  Functional ROM: Unrestricted ROM          Functional ROM: Unrestricted ROM          Muscle Tone/Strength: Functionally intact. No obvious neuro-muscular anomalies detected.  Muscle Tone/Strength: Functionally intact. No obvious neuro-muscular anomalies detected.  Sensory (Neurological): Unimpaired  Sensory (Neurological): Unimpaired  Palpation: No palpable anomalies  Palpation: No palpable anomalies   Assessment  Primary Diagnosis & Pertinent Problem List: The primary encounter diagnosis was Chronic neck pain (Primary Source of Pain). 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  Status Diagnosis  Controlled Controlled Controlled 1. Chronic neck pain (Primary Source of Pain)   2. Chronic low back pain (Secondary source of pain)   3. Arthropathy of shoulder [facility] source of pain) (Left)   4. Chronic shoulder pain (Tertiary source of  pain) (Bilateral) (L>R)   5. 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 Acute bilateral ankle pain   8. Pain in joint involving ankle and foot, unspecified laterality   9. Foot pain, bilateral (R>L)     Problems updated and reviewed during this visit: No problems updated. Plan of Care  Pharmacotherapy (Medications Ordered): No orders of the defined types were placed in this encounter.  Medications administered today: Regina Campos had no medications administered during this visit.  Procedure Orders    No procedure(s) ordered today   Lab Orders  No laboratory test(s) ordered today    Imaging Orders     DG Ankle Complete Left     DG Foot Complete Left     DG Ankle Complete Right     DG Foot Complete Right Referral Orders  No referral(s) requested  today    Interventional management options: Planned, scheduled, and/or pending:   Diagnostic bilateral ankle block #1 under fluoroscopic guidance, no sedation.   Considering:   Diagnostic bilateral suprascapular NB Possiblebilateral suprascapular RFA Diagnostic Left intra-articular shoulder joint injection Bilateral intra-articular Hip injection Palliative right-sided L3-4 lumbar epiduralsteroid injection Palliative right-sided L4-5 transforaminal epidural steroid injection Palliative right-sided L2-3 lumbar epiduralsteroid injection Palliative right-sided C7-T1 cervical epiduralsteroid injection Diagnostic bilateral cervical facet block Possible bilateral cervical facet radiofrequencyablation Diagnostic bilateral lumbar facet block Possible bilateral lumbar facet radiofrequencyablation   Palliative PRN treatment(s):   Palliative right-sided L3-4 lumbar epiduralsteroid injection Palliative right-sided L4-5 transforaminal epiduralsteroid injection Palliative right-sided L2-3 lumbar epiduralsteroid injection Palliative  right-sided C7-T1 cervical epiduralsteroid injection Palliativebilateral shoulder joint injection Palliativebilateral suprascapular nerve block   Provider-requested follow-up: Return for Procedure (no sedation): (B) Ankle BLK.  Future Appointments  Date Time Provider [place]  12/03/2017 10:15 AM Milinda Pointer, MD [facility] None  12/04/2017  9:30 AM [facility]  12/10/2017 11:30 AM Allyne Gee, MD [facility] None  02/22/2018 11:00 AM Allyne Gee, MD [facility] None  04/15/2018 11:00 AM Vevelyn Francois, NP [facility] None   Primary Care Physician: Lenard Simmer, MD Location: [facility] Outpatient Pain Management Facility Note by: Gaspar Cola, MD Date: 11/25/2017; Time: 10:57 AM

## 2017-11-25 NOTE — Patient Instructions (Addendum)
____________________________________________________________________________________________  Pain Scale  Introduction: The pain score used by this practice is the Verbal Numerical Rating Scale (VNRS-11). This is an 11-point scale. It is for adults and children 10 years or older. There are significant differences in how the pain score is reported, used, and applied. Forget everything you learned in the past and learn this scoring system.  General Information: The scale should reflect your current level of pain. Unless you are specifically asked for the level of your worst pain, or your average pain. If you are asked for one of these two, then it should be understood that it is over the past 24 hours.  Basic Activities of Daily Living (ADL): Personal hygiene, dressing, eating, transferring, and using restroom.  Instructions: Most patients tend to report their level of pain as a combination of two factors, their physical pain and their psychosocial pain. This last one is also known as "suffering" and it is reflection of how physical pain affects you socially and psychologically. From now on, report them separately. From this point on, when asked to report your pain level, report only your physical pain. Use the following table for reference.  Pain Clinic Pain Levels (0-5/10)  Pain Level Score  Description  No Pain 0   Mild pain 1 Nagging, annoying, but does not interfere with basic activities of daily living (ADL). Patients are able to eat, bathe, get dressed, toileting (being able to get on and off the toilet and perform personal hygiene functions), transfer (move in and out of bed or a chair without assistance), and maintain continence (able to control bladder and bowel functions). Blood pressure and heart rate are unaffected. A normal heart rate for a healthy adult ranges from 60 to 100 bpm (beats per minute).   Mild to moderate pain 2 Noticeable and distracting. Impossible to hide from other  people. More frequent flare-ups. Still possible to adapt and function close to normal. It can be very annoying and may have occasional stronger flare-ups. With discipline, patients may get used to it and adapt.   Moderate pain 3 Interferes significantly with activities of daily living (ADL). It becomes difficult to feed, bathe, get dressed, get on and off the toilet or to perform personal hygiene functions. Difficult to get in and out of bed or a chair without assistance. Very distracting. With effort, it can be ignored when deeply involved in activities.   Moderately severe pain 4 Impossible to ignore for more than a few minutes. With effort, patients may still be able to manage work or participate in some social activities. Very difficult to concentrate. Signs of autonomic nervous system discharge are evident: dilated pupils (mydriasis); mild sweating (diaphoresis); sleep interference. Heart rate becomes elevated (>115 bpm). Diastolic blood pressure (lower number) rises above 100 mmHg. Patients find relief in laying down and not moving.   Severe pain 5 Intense and extremely unpleasant. Associated with frowning face and frequent crying. Pain overwhelms the senses.  Ability to do any activity or maintain social relationships becomes significantly limited. Conversation becomes difficult. Pacing back and forth is common, as getting into a comfortable position is nearly impossible. Pain wakes you up from deep sleep. Physical signs will be obvious: pupillary dilation; increased sweating; goosebumps; brisk reflexes; cold, clammy hands and feet; nausea, vomiting or dry heaves; loss of appetite; significant sleep disturbance with inability to fall asleep or to remain asleep. When persistent, significant weight loss is observed due to the complete loss of appetite and sleep deprivation.  Blood   pressure and heart rate becomes significantly elevated. Caution: If elevated blood pressure triggers a pounding headache  associated with blurred vision, then the patient should immediately seek attention at an urgent or emergency care unit, as these may be signs of an impending stroke.    Emergency Department Pain Levels (6-10/10)  Emergency Room Pain 6 Severely limiting. Requires emergency care and should not be seen or managed at an outpatient pain management facility. Communication becomes difficult and requires great effort. Assistance to reach the emergency department may be required. Facial flushing and profuse sweating along with potentially dangerous increases in heart rate and blood pressure will be evident.   Distressing pain 7 Self-care is very difficult. Assistance is required to transport, or use restroom. Assistance to reach the emergency department will be required. Tasks requiring coordination, such as bathing and getting dressed become very difficult.   Disabling pain 8 Self-care is no longer possible. At this level, pain is disabling. The individual is unable to do even the most "basic" activities such as walking, eating, bathing, dressing, transferring to a bed, or toileting. Fine motor skills are lost. It is difficult to think clearly.   Incapacitating pain 9 Pain becomes incapacitating. Thought processing is no longer possible. Difficult to remember your own name. Control of movement and coordination are lost.   The worst pain imaginable 10 At this level, most patients pass out from pain. When this level is reached, collapse of the autonomic nervous system occurs, leading to a sudden drop in blood pressure and heart rate. This in turn results in a temporary and dramatic drop in blood flow to the brain, leading to a loss of consciousness. Fainting is one of the body's self defense mechanisms. Passing out puts the brain in a calmed state and causes it to shut down for a while, in order to begin the healing process.    Summary: 1. Refer to this scale when providing [place] with your pain level. 2. Be  accurate and careful when reporting your pain level. This will help with your care. 3. Over-reporting your pain level will lead to loss of credibility. 4. Even a level of 1/10 means that there is pain and will be treated at our facility. 5. High, inaccurate reporting will be documented as "Symptom Exaggeration", leading to loss of credibility and suspicions of possible secondary gains such as obtaining more narcotics, or wanting to appear disabled, for fraudulent reasons. 6. Only pain levels of 5 or below will be seen at our facility. 7. Pain levels of 6 and above will be sent to the Emergency Department and the appointment cancelled. ____________________________________________________________________________________________   ____________________________________________________________________________________________  Preparing for your procedure (without sedation)  Instructions: . Oral Intake: Do not eat or drink anything for at least 3 hours prior to your procedure. . Transportation: Unless otherwise stated by your physician, you may drive yourself after the procedure. . Blood Pressure Medicine: Take your blood pressure medicine with a sip of water the morning of the procedure. . Blood thinners:  . Diabetics on insulin: Notify the staff so that you can be scheduled 1st case in the morning. If your diabetes requires high dose insulin, take only  of your normal insulin dose the morning of the procedure and notify the staff that you have done so. . Preventing infections: Shower with an antibacterial soap the morning of your procedure.  . Build-up your immune system: Take 1000 mg of Vitamin C with every meal (3 times a day) the day prior to  your procedure. Marland Kitchen Antibiotics: Inform the staff if you have a condition or reason that requires you to take antibiotics before dental procedures. . Pregnancy: If you are pregnant, call and cancel the procedure. . Sickness: If you have a cold, fever, or any  active infections, call and cancel the procedure. . Arrival: You must be in the facility at least 30 minutes prior to your scheduled procedure. . Children: Do not bring any children with you. . Dress appropriately: Bring dark clothing that you would not mind if they get stained. . Valuables: Do not bring any jewelry or valuables.  Procedure appointments are reserved for interventional treatments only. Marland Kitchen No Prescription Refills. . No medication changes will be discussed during procedure appointments. . No disability issues will be discussed.  Remember:  Regular Business hours are:  Monday to Thursday 8:00 AM to 4:00 PM  Provider's Schedule: Milinda Pointer, MD:  Procedure days: Tuesday and Thursday 7:30 AM to 4:00 PM  Gillis Santa, MD:  Procedure days: Monday and Wednesday 7:30 AM to 4:00 PM ____________________________________________________________________________________________   Report to medical mall admission desk for xrays.

## 2017-11-25 NOTE — Progress Notes (Signed)
Safety precautions to be maintained throughout the outpatient stay will include: orient to surroundings, keep bed in low position, maintain call bell within reach at all times, provide assistance with transfer out of bed and ambulation.  

## 2017-11-26 ENCOUNTER — Other Ambulatory Visit: Payer: Self-pay | Admitting: Gastroenterology

## 2017-11-26 ENCOUNTER — Ambulatory Visit
Admission: RE | Admit: 2017-11-26 | Discharge: 2017-11-26 | Disposition: A | Discharge status: Home / Self Care | Payer: Medicare Other | Source: Ambulatory Visit | Attending: Pain Medicine | Admitting: Pain Medicine

## 2017-11-26 DIAGNOSIS — R1032 Left lower quadrant pain: Secondary | ICD-10-CM

## 2017-11-26 DIAGNOSIS — R634 Abnormal weight loss: Secondary | ICD-10-CM

## 2017-11-26 DIAGNOSIS — R1012 Left upper quadrant pain: Secondary | ICD-10-CM

## 2017-11-26 DIAGNOSIS — M79671 Pain in right foot: Secondary | ICD-10-CM

## 2017-11-26 DIAGNOSIS — M79672 Pain in left foot: Secondary | ICD-10-CM

## 2017-11-26 DIAGNOSIS — M25571 Pain in right ankle and joints of right foot: Secondary | ICD-10-CM

## 2017-11-26 DIAGNOSIS — R1902 Left upper quadrant abdominal swelling, mass and lump: Secondary | ICD-10-CM

## 2017-11-26 DIAGNOSIS — M25579 Pain in unspecified ankle and joints of unspecified foot: Secondary | ICD-10-CM | POA: Diagnosis present

## 2017-11-26 DIAGNOSIS — M25572 Pain in left ankle and joints of left foot: Principal | ICD-10-CM

## 2017-11-26 DIAGNOSIS — M19071 Primary osteoarthritis, right ankle and foot: Secondary | ICD-10-CM | POA: Insufficient documentation

## 2017-11-26 DIAGNOSIS — R1031 Right lower quadrant pain: Secondary | ICD-10-CM

## 2017-12-03 ENCOUNTER — Other Ambulatory Visit: Payer: Self-pay

## 2017-12-03 ENCOUNTER — Ambulatory Visit
Admission: RE | Admit: 2017-12-03 | Discharge: 2017-12-03 | Disposition: A | Discharge status: Home / Self Care | Payer: Medicare Other | Source: Ambulatory Visit | Attending: Pain Medicine | Admitting: Pain Medicine

## 2017-12-03 ENCOUNTER — Encounter: Payer: Self-pay | Admitting: Pain Medicine

## 2017-12-03 ENCOUNTER — Ambulatory Visit (HOSPITAL_BASED_OUTPATIENT_CLINIC_OR_DEPARTMENT_OTHER): Payer: Medicare Other | Admitting: Pain Medicine

## 2017-12-03 VITALS — BP 133/69 | HR 68 | Temp 98.0°F | Resp 16 | Ht 65.0 in | Wt 234.0 lb

## 2017-12-03 DIAGNOSIS — M79672 Pain in left foot: Secondary | ICD-10-CM

## 2017-12-03 DIAGNOSIS — M25571 Pain in right ankle and joints of right foot: Secondary | ICD-10-CM | POA: Diagnosis not present

## 2017-12-03 DIAGNOSIS — M19072 Primary osteoarthritis, left ankle and foot: Secondary | ICD-10-CM | POA: Diagnosis not present

## 2017-12-03 DIAGNOSIS — M19071 Primary osteoarthritis, right ankle and foot: Secondary | ICD-10-CM

## 2017-12-03 DIAGNOSIS — Z981 Arthrodesis status: Secondary | ICD-10-CM | POA: Diagnosis not present

## 2017-12-03 DIAGNOSIS — G8929 Other chronic pain: Secondary | ICD-10-CM | POA: Insufficient documentation

## 2017-12-03 DIAGNOSIS — Z96659 Presence of unspecified artificial knee joint: Secondary | ICD-10-CM | POA: Insufficient documentation

## 2017-12-03 DIAGNOSIS — M25572 Pain in left ankle and joints of left foot: Secondary | ICD-10-CM

## 2017-12-03 DIAGNOSIS — Z96619 Presence of unspecified artificial shoulder joint: Secondary | ICD-10-CM | POA: Insufficient documentation

## 2017-12-03 DIAGNOSIS — M79671 Pain in right foot: Secondary | ICD-10-CM

## 2017-12-03 MED ORDER — LIDOCAINE HCL 2 % IJ SOLN
20.0000 mL | Freq: Once | INTRAMUSCULAR | Status: AC
  Administered 2017-12-03: 400 mg

## 2017-12-03 MED ORDER — ROPIVACAINE HCL 2 MG/ML IJ SOLN
INTRAMUSCULAR | Status: AC
  Filled 2017-12-03: qty 10

## 2017-12-03 MED ORDER — LIDOCAINE HCL 2 % IJ SOLN
INTRAMUSCULAR | Status: AC
  Filled 2017-12-03: qty 20

## 2017-12-03 MED ORDER — METHYLPREDNISOLONE ACETATE 80 MG/ML IJ SUSP
INTRAMUSCULAR | Status: AC
  Filled 2017-12-03: qty 1

## 2017-12-03 MED ORDER — ROPIVACAINE HCL 2 MG/ML IJ SOLN
10.0000 mL | Freq: Once | INTRAMUSCULAR | Status: AC
  Administered 2017-12-03: 10 mL

## 2017-12-03 MED ORDER — METHYLPREDNISOLONE ACETATE 80 MG/ML IJ SUSP
80.0000 mg | Freq: Once | INTRAMUSCULAR | Status: AC
  Administered 2017-12-03: 80 mg

## 2017-12-03 NOTE — Patient Instructions (Addendum)

## 2017-12-03 NOTE — Progress Notes (Signed)
Safety precautions to be maintained throughout the outpatient stay will include: orient to surroundings, keep bed in low position, maintain call bell within reach at all times, provide assistance with transfer out of bed and ambulation.  

## 2017-12-03 NOTE — Progress Notes (Signed)
Patient's Name: Regina Campos  MRN: [id number]  Referring Provider: Lenard Simmer, MD  DOB: 1950-08-31  PCP: Lenard Simmer, MD  DOS: 12/03/2017  Note by: Gaspar Cola, MD  Service setting: Ambulatory outpatient  Specialty: Interventional Pain Management  Patient type: Established  Location: [facility] (AMB) Pain Management Facility  Visit type: Interventional Procedure   Primary Reason for Visit: Interventional Pain Management Treatment. CC: Foot Pain (bilateral)  Procedure:  Anesthesia, Analgesia, Anxiolysis:  Type: Diagnostic Foot Steroid Injection Region: Dorsolateral Junction of Cuboid and 5th PMC Level: Foot Laterality: Bilateral  Type: Local Anesthesia Indication(s): Analgesia         Local Anesthetic: Lidocaine 1-2% Route: Infiltration (Sellers/IM) IV Access: Declined Sedation: Declined    Indications: 1. Osteoarthritis of ankles (Bilateral)   2. Osteoarthritis of feet (Bilateral)   3. Chronic ankle pain (Bilateral) (R>L)   4. Chronic foot pain (Left)   5. Chronic foot pain (Right)    Pain Score: Pre-procedure: 7 /10 Post-procedure: 3 /10  Pre-op Assessment:  Regina Campos is a 67 y.o. (year old), female patient, seen today for interventional treatment. She  has a past surgical history that includes Abdominal hysterectomy; Total shoulder replacement; Replacement total knee; Appendectomy; Lumbar fusion; Carpal tunnel release; and Back surgery (07/25/2016). Regina Campos has a current medication list which includes the following prescription(s): albuterol, budesonide-formoterol, ergocalciferol, fenofibrate, levothyroxine, lubiprostone, montelukast, naloxone, nitroglycerin, rosuvastatin, tiotropium, topiramate, and tramadol. Her primarily concern today is the Foot Pain (bilateral)  Initial Vital Signs:  Pulse/HCG Rate: 68ECG Heart Rate: 64 Temp: 98 F (36.7 C) Resp: 18 BP: 130/78 SpO2: 94 %  BMI: Estimated body mass index is 38.94 kg/m as calculated from the  following:   Height as of this encounter: 5\' 5"  (1.651 m).   Weight as of this encounter: 234 lb (106.1 kg).  Risk Assessment: Allergies: Reviewed. She has No Known Allergies.  Allergy Precautions: None required Coagulopathies: Reviewed. None identified.  Blood-thinner therapy: None at this time Active Infection(s): Reviewed. None identified. Regina Campos is afebrile  Site Confirmation: Regina Campos was asked to confirm the procedure and laterality before marking the site Procedure checklist: Completed Consent: Before the procedure and under the influence of no sedative(s), amnesic(s), or anxiolytics, the patient was informed of the treatment options, risks and possible complications. To fulfill our ethical and legal obligations, as recommended by the American Medical Association's Code of Ethics, I have informed the patient of my clinical impression; the nature and purpose of the treatment or procedure; the risks, benefits, and possible complications of the intervention; the alternatives, including doing nothing; the risk(s) and benefit(s) of the alternative treatment(s) or procedure(s); and the risk(s) and benefit(s) of doing nothing. The patient was provided information about the general risks and possible complications associated with the procedure. These may include, but are not limited to: failure to achieve desired goals, infection, bleeding, organ or nerve damage, allergic reactions, paralysis, and death. In addition, the patient was informed of those risks and complications associated to the procedure, such as failure to decrease pain; infection; bleeding; organ or nerve damage with subsequent damage to sensory, motor, and/or autonomic systems, resulting in permanent pain, numbness, and/or weakness of one or several areas of the body; allergic reactions; (i.e.: anaphylactic reaction); and/or death. Furthermore, the patient was informed of those risks and complications associated with the  medications. These include, but are not limited to: allergic reactions (i.e.: anaphylactic or anaphylactoid reaction(s)); adrenal axis suppression; blood sugar elevation that in diabetics may result  in ketoacidosis or comma; water retention that in patients with history of congestive heart failure may result in shortness of breath, pulmonary edema, and decompensation with resultant heart failure; weight gain; swelling or edema; medication-induced neural toxicity; particulate matter embolism and blood vessel occlusion with resultant organ, and/or nervous system infarction; and/or aseptic necrosis of one or more joints. Finally, the patient was informed that Medicine is not an exact science; therefore, there is also the possibility of unforeseen or unpredictable risks and/or possible complications that may result in a catastrophic outcome. The patient indicated having understood very clearly. We have given the patient no guarantees and we have made no promises. Enough time was given to the patient to ask questions, all of which were answered to the patient's satisfaction. Regina Campos has indicated that she wanted to continue with the procedure. Attestation: I, the ordering provider, attest that I have discussed with the patient the benefits, risks, side-effects, alternatives, likelihood of achieving goals, and potential problems during recovery for the procedure that I have provided informed consent. Date  Time: 12/03/2017 10:06 AM  Pre-Procedure Preparation:  Monitoring: As per clinic protocol. Respiration, ETCO2, SpO2, BP, heart rate and rhythm monitor placed and checked for adequate function Safety Precautions: Patient was assessed for positional comfort and pressure points before starting the procedure. Time-out: I initiated and conducted the "Time-out" before starting the procedure, as per protocol. The patient was asked to participate by confirming the accuracy of the "Time Out" information. Verification  of the correct person, site, and procedure were performed and confirmed by me, the nursing staff, and the patient. "Time-out" conducted as per Joint Commission's Universal Protocol (UP.01.01.01). Time: 1109  Description of Procedure Process:   Position: Supine Target Area: Space btween the cuboid and proximal end of 5th PMC bone. Approach: Dorsolateral approach. Area Prepped: Entire ankle Region Prepping solution: ChloraPrep (2% chlorhexidine gluconate and 70% isopropyl alcohol) Safety Precautions: Aspiration looking for blood return was conducted prior to all injections. At no point did we inject any substances, as a needle was being advanced. No attempts were made at seeking any paresthesias. Safe injection practices and needle disposal techniques used. Medications properly checked for expiration dates. SDV (single dose vial) medications used. Description of the Procedure: Protocol guidelines were followed. The patient was placed in position. The target area was identified and the area prepped in the usual manner. Skin & deeper tissues infiltrated with local anesthetic. Appropriate amount of time allowed to pass for local anesthetics to take effect. The procedure needles were then advanced to the target area. Proper needle placement secured. Negative aspiration confirmed. Solution injected in intermittent fashion, asking for systemic symptoms every 0.5cc of injectate. The needles were then removed and the area cleansed, making sure to leave some of the prepping solution back to take advantage of its long term bactericidal properties.                        Vitals:   12/03/17 1005 12/03/17 1107 12/03/17 1113 12/03/17 1121  BP: 130/78 129/75 130/84 133/69  Pulse: 68     Resp: 18 14 15 16   Temp: 98 F (36.7 C)     TempSrc: Oral     SpO2: 94% 94% 95% 97%  Weight: 234 lb (106.1 kg)     Height: 5\' 5"  (1.651 m)       Start Time: 1109 hrs. End Time: 1121 hrs.  Materials:  Needle(s) Type:  Regular needle Gauge: 25G Length: 1.5-in  Medication(s): Please see orders for medications and dosing details.  Imaging Guidance:  Type of Imaging Technique: None used Indication(s): N/A Exposure Time: No patient exposure Contrast: None used. Fluoroscopic Guidance: N/A Ultrasound Guidance: N/A Interpretation: N/A  Antibiotic Prophylaxis:   Anti-infectives (From admission, onward)   None     Indication(s): None identified  Post-operative Assessment:  Post-procedure Vital Signs:  Pulse/HCG Rate: 6866 Temp: 98 F (36.7 C) Resp: 16 BP: 133/69 SpO2: 97 %  EBL: None  Complications: No immediate post-treatment complications observed by team, or reported by patient.  Note: The patient tolerated the entire procedure well. A repeat set of vitals were taken after the procedure and the patient was kept under observation following institutional policy, for this type of procedure. Post-procedural neurological assessment was performed, showing return to baseline, prior to discharge. The patient was provided with post-procedure discharge instructions, including a section on how to identify potential problems. Should any problems arise concerning this procedure, the patient was given instructions to immediately contact us, at any time, without hesitation. In any case, we plan to contact the patient by telephone for a follow-up status report regarding this interventional procedure.  Comments:  No additional relevant information.  Plan of Care    Imaging Orders     DG C-Arm 1-60 Min-No Report  Procedure Orders     Small Joint Injection/Arthrocentesis  Medications ordered for procedure: Meds ordered this encounter  Medications  . lidocaine (XYLOCAINE) 2 % (with pres) injection 400 mg  . methylPREDNISolone acetate (DEPO-MEDROL) injection 80 mg  . ropivacaine (PF) 2 mg/mL (0.2%) (NAROPIN) injection 10 mL   Medications administered: We administered lidocaine, methylPREDNISolone  acetate, and ropivacaine (PF) 2 mg/mL (0.2%).  See the medical record for exact dosing, route, and time of administration.  New Prescriptions   No medications on file   Disposition: Discharge home  Discharge Date & Time: 12/03/2017; 1130 hrs.   Physician-requested Follow-up: Return for post-procedure eval (2 wks), w/ Dionisio David, NP.  Future Appointments  Date Time Provider [place]  12/04/2017  9:30 AM [facility]  12/10/2017 11:30 AM Allyne Gee, MD [facility] None  12/17/2017 11:15 AM Vevelyn Francois, NP [facility] None  02/22/2018 11:00 AM Allyne Gee, MD [facility] None  04/15/2018 11:00 AM Vevelyn Francois, NP [facility] None   Primary Care Physician: Lenard Simmer, MD Location: [facility] Outpatient Pain Management Facility Note by: Gaspar Cola, MD Date: 12/03/2017; Time: 12:37 PM  Disclaimer:  Medicine is not an exact science. The only guarantee in medicine is that nothing is guaranteed. It is important to note that the decision to proceed with this intervention was based on the information collected from the patient. The Data and conclusions were drawn from the patient's questionnaire, the interview, and the physical examination. Because the information was provided in large part by the patient, it cannot be guaranteed that it has not been purposely or unconsciously manipulated. Every effort has been made to obtain as much relevant data as possible for this evaluation. It is important to note that the conclusions that lead to this procedure are derived in large part from the available data. Always take into account that the treatment will also be dependent on availability of resources and existing treatment guidelines, considered by other Pain Management Practitioners as being common knowledge and practice, at the time of the intervention. For Medico-Legal purposes, it is also important to point out that variation in procedural techniques and  pharmacological choices are the  acceptable norm. The indications, contraindications, technique, and results of the above procedure should only be interpreted and judged by a Board-Certified Interventional Pain Specialist with extensive familiarity and expertise in the same exact procedure and technique.

## 2017-12-04 ENCOUNTER — Ambulatory Visit
Admission: RE | Admit: 2017-12-04 | Discharge: 2017-12-04 | Disposition: A | Discharge status: Home / Self Care | Payer: Medicare Other | Source: Ambulatory Visit | Attending: Gastroenterology | Admitting: Gastroenterology

## 2017-12-04 ENCOUNTER — Telehealth: Payer: Self-pay

## 2017-12-04 DIAGNOSIS — I700 Atherosclerosis of aorta: Secondary | ICD-10-CM | POA: Diagnosis not present

## 2017-12-04 DIAGNOSIS — R1031 Right lower quadrant pain: Secondary | ICD-10-CM | POA: Diagnosis present

## 2017-12-04 DIAGNOSIS — R634 Abnormal weight loss: Secondary | ICD-10-CM | POA: Diagnosis not present

## 2017-12-04 DIAGNOSIS — R1902 Left upper quadrant abdominal swelling, mass and lump: Secondary | ICD-10-CM | POA: Insufficient documentation

## 2017-12-04 DIAGNOSIS — R1032 Left lower quadrant pain: Secondary | ICD-10-CM | POA: Diagnosis present

## 2017-12-04 DIAGNOSIS — R1012 Left upper quadrant pain: Secondary | ICD-10-CM | POA: Insufficient documentation

## 2017-12-04 MED ORDER — IOPAMIDOL (ISOVUE-300) INJECTION 61%
100.0000 mL | Freq: Once | INTRAVENOUS | Status: AC | PRN
  Administered 2017-12-04: 100 mL via INTRAVENOUS

## 2017-12-04 NOTE — Telephone Encounter (Signed)
Post procedure phone call.  Left message.  

## 2017-12-10 ENCOUNTER — Ambulatory Visit: Payer: Self-pay | Admitting: Internal Medicine

## 2017-12-16 ENCOUNTER — Ambulatory Visit (INDEPENDENT_AMBULATORY_CARE_PROVIDER_SITE_OTHER): Payer: Medicare Other

## 2017-12-16 DIAGNOSIS — G4733 Obstructive sleep apnea (adult) (pediatric): Secondary | ICD-10-CM

## 2017-12-16 NOTE — Progress Notes (Signed)
95 percentile pressure 13.3   95th percentile leak 7.4   apnea index 1.3 /hr  apnea-hypopnea index  1.4 /hr   total days used  >4 hr 154 days  total days used <4 hr 26 days  Total compliance 86 percent  Doing great no problems no question.

## 2017-12-17 ENCOUNTER — Encounter: Payer: Self-pay | Admitting: Nurse Practitioner

## 2017-12-17 ENCOUNTER — Ambulatory Visit: Payer: Medicare Other | Attending: Nurse Practitioner | Admitting: Nurse Practitioner

## 2017-12-17 ENCOUNTER — Other Ambulatory Visit: Payer: Self-pay

## 2017-12-17 VITALS — BP 130/76 | HR 76 | Temp 97.8°F | Resp 16 | Ht 65.0 in | Wt 231.0 lb

## 2017-12-17 DIAGNOSIS — M4802 Spinal stenosis, cervical region: Secondary | ICD-10-CM | POA: Insufficient documentation

## 2017-12-17 DIAGNOSIS — Z79890 Hormone replacement therapy (postmenopausal): Secondary | ICD-10-CM | POA: Diagnosis not present

## 2017-12-17 DIAGNOSIS — Z5181 Encounter for therapeutic drug level monitoring: Secondary | ICD-10-CM | POA: Insufficient documentation

## 2017-12-17 DIAGNOSIS — J439 Emphysema, unspecified: Secondary | ICD-10-CM | POA: Insufficient documentation

## 2017-12-17 DIAGNOSIS — Z981 Arthrodesis status: Secondary | ICD-10-CM | POA: Insufficient documentation

## 2017-12-17 DIAGNOSIS — G894 Chronic pain syndrome: Secondary | ICD-10-CM

## 2017-12-17 DIAGNOSIS — Z7951 Long term (current) use of inhaled steroids: Secondary | ICD-10-CM | POA: Diagnosis not present

## 2017-12-17 DIAGNOSIS — G8929 Other chronic pain: Secondary | ICD-10-CM

## 2017-12-17 DIAGNOSIS — M4726 Other spondylosis with radiculopathy, lumbar region: Secondary | ICD-10-CM | POA: Insufficient documentation

## 2017-12-17 DIAGNOSIS — Z8249 Family history of ischemic heart disease and other diseases of the circulatory system: Secondary | ICD-10-CM | POA: Diagnosis not present

## 2017-12-17 DIAGNOSIS — E7800 Pure hypercholesterolemia, unspecified: Secondary | ICD-10-CM | POA: Diagnosis not present

## 2017-12-17 DIAGNOSIS — R51 Headache: Secondary | ICD-10-CM | POA: Insufficient documentation

## 2017-12-17 DIAGNOSIS — T402X5A Adverse effect of other opioids, initial encounter: Secondary | ICD-10-CM | POA: Insufficient documentation

## 2017-12-17 DIAGNOSIS — M19071 Primary osteoarthritis, right ankle and foot: Secondary | ICD-10-CM | POA: Diagnosis not present

## 2017-12-17 DIAGNOSIS — Z79891 Long term (current) use of opiate analgesic: Secondary | ICD-10-CM | POA: Insufficient documentation

## 2017-12-17 DIAGNOSIS — M19072 Primary osteoarthritis, left ankle and foot: Secondary | ICD-10-CM | POA: Insufficient documentation

## 2017-12-17 DIAGNOSIS — Z96659 Presence of unspecified artificial knee joint: Secondary | ICD-10-CM | POA: Diagnosis not present

## 2017-12-17 DIAGNOSIS — Z96611 Presence of right artificial shoulder joint: Secondary | ICD-10-CM | POA: Insufficient documentation

## 2017-12-17 DIAGNOSIS — Z90710 Acquired absence of both cervix and uterus: Secondary | ICD-10-CM | POA: Diagnosis not present

## 2017-12-17 DIAGNOSIS — Z6838 Body mass index (BMI) 38.0-38.9, adult: Secondary | ICD-10-CM | POA: Insufficient documentation

## 2017-12-17 DIAGNOSIS — E039 Hypothyroidism, unspecified: Secondary | ICD-10-CM | POA: Insufficient documentation

## 2017-12-17 DIAGNOSIS — M25511 Pain in right shoulder: Secondary | ICD-10-CM | POA: Insufficient documentation

## 2017-12-17 DIAGNOSIS — M797 Fibromyalgia: Secondary | ICD-10-CM | POA: Diagnosis not present

## 2017-12-17 DIAGNOSIS — M25532 Pain in left wrist: Secondary | ICD-10-CM | POA: Insufficient documentation

## 2017-12-17 DIAGNOSIS — M961 Postlaminectomy syndrome, not elsewhere classified: Secondary | ICD-10-CM | POA: Diagnosis not present

## 2017-12-17 DIAGNOSIS — K5903 Drug induced constipation: Secondary | ICD-10-CM | POA: Insufficient documentation

## 2017-12-17 DIAGNOSIS — M48061 Spinal stenosis, lumbar region without neurogenic claudication: Secondary | ICD-10-CM | POA: Insufficient documentation

## 2017-12-17 DIAGNOSIS — F329 Major depressive disorder, single episode, unspecified: Secondary | ICD-10-CM | POA: Diagnosis not present

## 2017-12-17 DIAGNOSIS — Z789 Other specified health status: Secondary | ICD-10-CM | POA: Insufficient documentation

## 2017-12-17 DIAGNOSIS — M25572 Pain in left ankle and joints of left foot: Secondary | ICD-10-CM

## 2017-12-17 DIAGNOSIS — M542 Cervicalgia: Secondary | ICD-10-CM | POA: Insufficient documentation

## 2017-12-17 DIAGNOSIS — M4722 Other spondylosis with radiculopathy, cervical region: Secondary | ICD-10-CM | POA: Insufficient documentation

## 2017-12-17 DIAGNOSIS — E669 Obesity, unspecified: Secondary | ICD-10-CM | POA: Insufficient documentation

## 2017-12-17 DIAGNOSIS — M79671 Pain in right foot: Secondary | ICD-10-CM | POA: Diagnosis present

## 2017-12-17 DIAGNOSIS — Z79899 Other long term (current) drug therapy: Secondary | ICD-10-CM | POA: Insufficient documentation

## 2017-12-17 DIAGNOSIS — M79672 Pain in left foot: Secondary | ICD-10-CM | POA: Insufficient documentation

## 2017-12-17 DIAGNOSIS — M25512 Pain in left shoulder: Secondary | ICD-10-CM | POA: Insufficient documentation

## 2017-12-17 DIAGNOSIS — M25571 Pain in right ankle and joints of right foot: Secondary | ICD-10-CM | POA: Diagnosis not present

## 2017-12-17 NOTE — Progress Notes (Signed)
Patient's Name: Regina Campos  MRN: [id number]  Referring Provider: Lenard Simmer, MD  DOB: [birth year]  PCP: Lenard Simmer, MD  DOS: 12/17/2017  Note by: Vevelyn Francois NP  Service setting: Ambulatory outpatient  Specialty: Interventional Pain Management  Location: [facility] (AMB) Pain Management Facility    Patient type: Established    Primary Reason(s) for Visit: Encounter for prescription drug management & post-procedure evaluation of chronic illness with mild to moderate exacerbation(Level of risk: moderate) CC: Foot Pain (right) and Knee Pain (left)  HPI  Regina Campos is a 67 y.o. year old, female patient, who comes today for a post-procedure evaluation and medication management. She has Encounter for therapeutic drug level monitoring; Long term current use of opiate analgesic; Uncomplicated opioid dependence ([place]); Opiate use (40 MME/Day); Chronic neck pain (Primary Source of Pain); Chronic low back pain (Secondary source of pain); Lumbar radicular pain; Lumbar spondylosis with radicular symptoms; Chronic pain syndrome; Pulmonary emphysema ([place]); Atypical migraine; Apnea, sleep; High cholesterol; Hypothyroidism; History of cervical spinal surgery; Cervical foraminal stenosis; Cervicogenic headache; Fibromyalgia; History of cardiac arrhythmia; Depression; Obesity; 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); Cervical central spinal stenosis; Long term prescription opiate use; Myofascial pain; Cervical paraspinal muscle spasm; Cervical spondylosis with radiculopathy (Right side); 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); Opioid-induced constipation (OIC); 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); Chronic shoulder pain [facility] source of pain) (Bilateral) (L>R); Osteoarthritis; Constipation; COPD (chronic obstructive pulmonary disease) ([place]); Diastolic dysfunction; HLD (hyperlipidemia); Obesity (BMI 30-39.9); Chronic hip pain (Left); Chronic  hip pain (Right); 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 Her primarily concern today is the Foot Pain (right) and Knee Pain (left)  Pain Assessment: Location: Right Foot Radiating:   Onset: More than a month ago Duration: Chronic pain Quality: Hervey Ard, Dull Severity: 7 /10 (self-reported pain score)  Note: Reported level is compatible with observation. Clinically the patient looks like a 3/10 A 3/10 is viewed as "Moderate" and described as significantly interfering with activities of daily living (ADL). It becomes difficult to feed, bathe, get dressed, get on and off the toilet or to perform personal hygiene functions. Difficult to get in and out of bed or a chair without assistance. Very distracting. With effort, it can be ignored when deeply involved in activities. Information on the proper use of the pain scale provided to the patient today. When using our objective Pain Scale, levels between 6 and 10/10 are said to belong in an emergency room, as it progressively worsens from a 6/10, described as severely limiting, requiring emergency care not usually available at an outpatient pain management facility. At a 6/10 level, communication becomes difficult and requires great effort. Assistance to reach the emergency department may be required. Facial flushing and profuse sweating along with potentially dangerous increases in heart rate and blood pressure will be  evident. Effect on ADL:   Timing: Constant Modifying factors: rest, procedures  Regina Campos was last seen on 10/07/2017 for a procedure. During today's appointment we reviewed Regina Campos's post-procedure results, as well as her outpatient medication regimen.  She admits that she feels the left foot got better relief than the right. She admits that the left foot is the one that she sits on the most,. She was told the left was worse than the right.   Further details on both, my assessment(s), as well as the proposed treatment plan, please see below.             Post-Procedure Assessment  12/03/2017 Procedure: Diagnostic bilateral Foot Steroid Injection Pre-procedure pain score:  7/10 Post-procedure pain score: 3/10         Influential Factors: BMI: 38.44 kg/m Intra-procedural challenges: None observed.         Assessment challenges: None detected.              Reported side-effects: None.        Post-procedural adverse reactions or complications: None reported         Sedation: Please see nurses note. When no sedatives are used, the analgesic levels obtained are directly associated to the effectiveness of the local anesthetics. However, when sedation is provided, the level of analgesia obtained during the initial 1 hour following the intervention, is believed to be the result of a combination of factors. These factors may include, but are not limited to: 1. The effectiveness of the local anesthetics used. 2. The effects of the analgesic(s) and/or anxiolytic(s) used. 3. The degree of discomfort experienced by the patient at the time of the procedure. 4. The patients ability and reliability in recalling and recording the events. 5. The presence and influence of possible secondary gains and/or psychosocial factors. Reported result: Relief experienced during the 1st hour after the procedure: 100 % (Ultra-Short Term Relief)            Interpretative annotation: Clinically appropriate result.  Analgesia during this period is likely to be Local Anesthetic and/or IV Sedative (Analgesic/Anxiolytic) related.          Effects of local anesthetic: The analgesic effects attained during this period are directly associated to the localized infiltration of local anesthetics and therefore cary significant diagnostic value as to the etiological location, or anatomical origin, of the pain. Expected duration of relief is directly dependent on the pharmacodynamics of the local anesthetic used. Long-acting (4-6 hours) anesthetics used.  Reported result: Relief during the next 4 to 6 hour after the procedure: (left 90%, right 0%) (Short-Term Relief)            Interpretative annotation: Clinically appropriate result. Analgesia during this period is likely to be Local Anesthetic-related.          Long-term benefit: Defined as the period of time past the expected duration of local anesthetics (1 hour for short-acting and 4-6 hours for long-acting). With the possible exception of prolonged sympathetic blockade from the local anesthetics, benefits during this period are typically attributed to, or associated with, other factors such as analgesic sensory neuropraxia, antiinflammatory effects, or beneficial biochemical changes provided by agents other than the local anesthetics.  Reported result: Extended relief following procedure: (left 90%, right 0%) (Long-Term Relief)            Interpretative annotation: Clinically appropriate result. Good relief. No permanent benefit expected. Inflammation plays a part in the etiology to the pain.          Current benefits: Defined as reported results that persistent at this point in time.   Analgesia: >50 %           on the left Function: Ms. Broadfoot reports  improvement in function left ankle.  ROM: Ms. Pickelsimer reports improvement in ROM Interpretative annotation: Recurrence of symptoms.on the right No permanent benefit expected. Effective diagnostic intervention.           Interpretation: Results would suggest a successful diagnostic intervention.                  Plan:  Please see "Plan of Care" for details.                Laboratory Chemistry  Inflammation Markers (CRP: Acute Phase) (ESR: Chronic Phase) Lab Results  Component Value Date   ESRSEDRATE 11 12/06/2012                         Rheumatology Markers No results found for: RF, ANA, LABURIC, URICUR, LYMEIGGIGMAB, LYMEABIGMQN                      Renal Function Markers No results found for: BUN, CREATININE, GFRAA, GFRNONAA                            Hepatic Function Markers No results found for: AST, ALT, ALBUMIN, ALKPHOS, HCVAB, AMYLASE, LIPASE, AMMONIA                      Electrolytes No results found for: NA, K, CL, CALCIUM, MG, PHOS                      Neuropathy Markers No results found for: VITAMINB12, FOLATE, HGBA1C, HIV                      Bone Pathology Markers No results found for: VD25OH, [id number], [id number], [id number], 25OHVITD1, 25OHVITD2, 25OHVITD3, TESTOFREE, TESTOSTERONE                       Coagulation Parameters No results found for: INR, LABPROT, APTT, PLT, DDIMER                      Cardiovascular Markers No results found for: BNP, CKTOTAL, CKMB, TROPONINI, HGB, HCT                       CA Markers No results found for: CEA, CA125, LABCA2                      Note: Lab results reviewed.  Recent Diagnostic Imaging Results  CT ABDOMEN PELVIS W CONTRAST CLINICAL DATA:  Left upper quadrant abdominal pain. Lower abdominal pain bilaterally. Weight loss. Left upper quadrant mass.  EXAM: CT ABDOMEN AND PELVIS WITH CONTRAST  TECHNIQUE: Multidetector CT imaging of the abdomen and pelvis was performed using the standard protocol following bolus administration of intravenous contrast.  CONTRAST:  15m ISOVUE-300 IOPAMIDOL (ISOVUE-300) INJECTION 61%  COMPARISON:  None.  FINDINGS: Lower chest: Left base scarring. Borderline cardiomegaly,  without pericardial or pleural effusion.  Hepatobiliary: Normal liver. Normal gallbladder, without biliary ductal dilatation.  Pancreas: Normal, without mass or ductal dilatation.  Spleen: Normal in size, without focal abnormality.  Adrenals/Urinary Tract: Normal adrenal glands. Interpolar left renal too small to characterize lesion. Normal right kidney Normal urinary bladder.  Stomach/Bowel: Gastric antral underdistention. Colonic stool burden suggests constipation. Extensive colonic diverticulosis. Normal terminal ileum. Normal small bowel.  Vascular/Lymphatic: Aortic and branch vessel atherosclerosis. No  abdominopelvic adenopathy.  Reproductive: Hysterectomy.  No adnexal mass.  Other: No significant free fluid.  Musculoskeletal: L3-5 trans pedicle screw fixation.  IMPRESSION: 1.  No acute process in the abdomen or pelvis. 2.  Possible constipation. 3.  Aortic Atherosclerosis (ICD10-I70.0).  Electronically Signed   By: Abigail Miyamoto M.D.   On: 12/05/2017 11:15  Complexity Note: Imaging results reviewed. Results shared with Ms. Vaden, using Layman's terms.                         Meds   Current Outpatient Medications:  .  albuterol (PROVENTIL HFA;VENTOLIN HFA) 108 (90 Base) MCG/ACT inhaler, Inhale into the lungs., Disp: , Rfl:  .  budesonide-formoterol (SYMBICORT) 160-4.5 MCG/ACT inhaler, Inhale 2 puffs into the lungs 2 (two) times daily., Disp: , Rfl:  .  ergocalciferol (VITAMIN D2) 50000 units capsule, Take 50,000 Units by mouth once a week., Disp: , Rfl:  .  fenofibrate (TRICOR) 145 MG tablet, Take 145 mg by mouth daily., Disp: , Rfl:  .  levothyroxine (SYNTHROID, LEVOTHROID) 125 MCG tablet, Take 125 mcg by mouth daily before breakfast., Disp: , Rfl:  .  lubiprostone (AMITIZA) 24 MCG capsule, Take 24 mcg by mouth 2 (two) times daily with a meal., Disp: , Rfl:  .  montelukast (SINGULAIR) 10 MG tablet, Take by mouth., Disp: , Rfl:  .  naloxone (NARCAN) 0.4 MG/ML  injection, Inject 1 mL (0.4 mg total) into the muscle as needed (for pain medication overdose.). Inject into thigh muscle, then call 911., Disp: 2 mL, Rfl: 0 .  nitroGLYCERIN (NITROSTAT) 0.4 MG SL tablet, TAKE ONE TABLET SUBLINGUALLY EVERY 5 MINUTES X 3 FOR CHEST PAIN, Disp: , Rfl: 0 .  rosuvastatin (CRESTOR) 10 MG tablet, Take 10 mg by mouth daily., Disp: , Rfl:  .  tiotropium (SPIRIVA) 18 MCG inhalation capsule, Place into inhaler and inhale., Disp: , Rfl:  .  topiramate (TOPAMAX) 100 MG tablet, Take 100 mg by mouth 2 (two) times daily., Disp: , Rfl:  .  traMADol (ULTRAM) 50 MG tablet, Take 2 tablets (100 mg total) by mouth every 6 (six) hours as needed for moderate pain or severe pain., Disp: 720 tablet, Rfl: 1  ROS  Constitutional: Denies any fever or chills Gastrointestinal: No reported hemesis, hematochezia, vomiting, or acute GI distress Musculoskeletal: Denies any acute onset joint swelling, redness, loss of ROM, or weakness Neurological: No reported episodes of acute onset apraxia, aphasia, dysarthria, agnosia, amnesia, paralysis, loss of coordination, or loss of consciousness  Allergies  Ms. Quackenbush has No Known Allergies.  [place]  Drug: Ms. Hauck  reports that she does not use drugs. Alcohol:  reports that she does not drink alcohol. Tobacco:  reports that she has never smoked. She has never used smokeless tobacco. Medical:  has a past medical history of Arthritis, Displacement of lumbar intervertebral disc (06/04/2015), History of cardiac arrhythmia (06/04/2015), History of cervical spinal surgery (06/04/2015), Hyperlipidemia, Hypothyroidism (06/04/2015), and Thyroid disease. Surgical: Ms. Dechaine  has a past surgical history that includes Abdominal hysterectomy; Total shoulder replacement; Replacement total knee; Appendectomy; Lumbar fusion; Carpal tunnel release; and Back surgery (07/25/2016). Family: family history includes Cancer in her mother; Heart disease in her  father.  Constitutional Exam  General appearance: Well nourished, well developed, and well hydrated. In no apparent acute distress Vitals:   12/17/17 1122  BP: 130/76  Pulse: 76  Resp: 16  Temp: 97.8 F (36.6 C)  TempSrc: Oral  SpO2: 96%  Weight: 231 lb (104.8 kg)  Height: _0  (1.651 m)  Psych/Mental status: Alert, oriented x 3 (person, place, & time)       Eyes: PERLA Respiratory: No evidence of acute respiratory distress  Gait & Posture Assessment  Ambulation: Unassisted Gait: Relatively normal for age and body habitus Posture: WNL   Lower Extremity Exam    Side: Right lower extremity  Side: Left lower extremity  Skin & Extremity Inspection: Skin color, temperature, and hair growth are WNL. No peripheral edema or cyanosis. No masses, redness, swelling, asymmetry, or associated skin lesions. No contractures.  Skin & Extremity Inspection: Skin color, temperature, and hair growth are WNL. No peripheral edema or cyanosis. No masses, redness, swelling, asymmetry, or associated skin lesions. No contractures.  Functional ROM: Adequate ROM          Functional ROM: Adequate ROM          Muscle Tone/Strength: Functionally intact. No obvious neuro-muscular anomalies detected.  Muscle Tone/Strength: Functionally intact. No obvious neuro-muscular anomalies detected.  Sensory (Neurological): Unimpaired  Sensory (Neurological): Unimpaired  Palpation: No palpable anomalies  Palpation: No palpable anomalies   Assessment  Primary Diagnosis & Pertinent Problem List: The primary encounter diagnosis was Chronic ankle pain (Bilateral) (R>L). Diagnoses of Osteoarthritis of ankles (Bilateral), Pain in joint involving right ankle and foot, and Chronic pain syndrome were also pertinent to this visit.  Status Diagnosis  Controlled Controlled Controlled 1. Chronic ankle pain (Bilateral) (R>L)   2. Osteoarthritis of ankles (Bilateral)   3. Pain in joint involving right ankle and foot   4. Chronic  pain syndrome     Problems updated and reviewed during this visit: Problem  Chronic foot pain (Left)  Problems Influencing Health Status   Plan of Care  Pharmacotherapy (Medications Ordered): No orders of the defined types were placed in this encounter.  New Prescriptions   No medications on file   Medications administered today: Donne Anon. Delisa had no medications administered during this visit. Lab-work, procedure(s), and/or referral(s): Orders Placed This Encounter  Procedures  . Misc procedure   Imaging and/or referral(s): None  Interventional management options: Planned, scheduled, and/or pending:   Right ankle block with not sedation    Considering:   Diagnostic bilateral suprascapular NB Possiblebilateral suprascapular RFA Diagnostic Left intra-articular shoulder joint injection Bilateral intra-articular Hip injection Palliative right-sided L3-4 lumbar epiduralsteroid injection Palliative right-sided L4-5 transforaminal epidural steroid injection Palliative right-sided L2-3 lumbar epiduralsteroid injection Palliative right-sided C7-T1 cervical epiduralsteroid injection Diagnostic bilateral cervical facet block Possible bilateral cervical facet radiofrequencyablation Diagnostic bilateral lumbar facet block Possible bilateral lumbar facet radiofrequencyablation   Palliative PRN treatment(s):   Palliative right-sided L3-4 lumbar epiduralsteroid injection Palliative right-sided L4-5 transforaminal epiduralsteroid injection Palliative right-sided L2-3 lumbar epiduralsteroid injection Palliative right-sided C7-T1 cervical epiduralsteroid injection Palliativebilateral shoulder joint injection Palliativebilateral suprascapular nerve block      Provider-requested follow-up: Return for Appointment As Scheduled, in addition, Procedure(NS), w/ Dr. Dossie Arbour, (ASAP).  Future Appointments  Date Time Provider [place]  12/31/2017 10:15 AM Milinda Pointer, MD [facility] None  01/18/2018 11:00 AM Allyne Gee, MD [facility] None  02/22/2018 11:00 AM Allyne Gee, MD [facility] None  04/15/2018 11:00 AM Vevelyn Francois, NP [facility] None  06/23/2018  9:45 AM [facility] None   Primary Care Physician: Lenard Simmer, MD Location: [facility] Note by: Vevelyn Francois NP Date: 12/17/2017; Time: 1:15 PM  Pain Score Disclaimer: We use the NRS-11 scale. This is a  self-reported, subjective measurement of pain severity with only modest accuracy. It is used primarily to identify changes within a particular patient. It must be understood that outpatient pain scales are significantly less accurate that those used for research, where they can be applied under ideal controlled circumstances with minimal exposure to variables. In reality, the score is likely to be a combination of pain intensity and pain affect, where pain affect describes the degree of emotional arousal or changes in action readiness caused by the sensory experience of pain. Factors such as social and work situation, setting, emotional state, anxiety levels, expectation, and prior pain experience may influence pain perception and show large inter-individual differences that may also be affected by time variables.  Patient instructions provided during this appointment: Patient Instructions    BMI Assessment: Estimated body mass index is 38.44 kg/m as calculated from the following:   Height as of this encounter: _0  (1.651 m).   Weight as of this encounter: 231 lb (104.8 kg).  BMI interpretation table: BMI level Category Range association with higher incidence of chronic pain  <18 kg/m2 Underweight   18.5-24.9 kg/m2 Ideal body weight   25-29.9 kg/m2 Overweight Increased incidence by 20%  30-34.9 kg/m2 Obese (Class I) Increased incidence by 68%  35-39.9 kg/m2 Severe obesity (Class II) Increased incidence by 136%  >40 kg/m2 Extreme  obesity (Class III) Increased incidence by 254%   Patient's current BMI Ideal Body weight  Body mass index is 38.44 kg/m. Ideal body weight: 57 kg (125 lb 10.6 oz) Adjusted ideal body weight: 76.1 kg (167 lb 12.8 oz)   BMI Readings from Last 4 Encounters:  12/17/17 38.44 kg/m  12/03/17 38.94 kg/m  11/25/17 38.94 kg/m  11/12/17 38.97 kg/m   Wt Readings from Last 4 Encounters:  12/17/17 231 lb (104.8 kg)  12/03/17 234 lb (106.1 kg)  11/25/17 234 lb (106.1 kg)  11/12/17 234 lb 3.2 oz (106.2 kg)  Preparing for your procedure (without sedation) Instructions: . Oral Intake: Do not eat or drink anything for at least 3 hours prior to your procedure. . Transportation: Unless otherwise stated by your physician, you may drive yourself after the procedure. . Blood Pressure Medicine: Take your blood pressure medicine with a sip of water the morning of the procedure. . Insulin: Take only  of your normal insulin dose. . Preventing infections: Shower with an antibacterial soap the morning of your procedure. . Build-up your immune system: Take 1000 mg of Vitamin C with every meal (3 times a day) the day prior to your procedure. . Pregnancy: If you are pregnant, call and cancel the procedure. . Sickness: If you have a cold, fever, or any active infections, call and cancel the procedure. . Arrival: You must be in the facility at least 30 minutes prior to your scheduled procedure. . Children: Do not bring any children with you. . Dress appropriately: Bring dark clothing that you would not mind if they get stained. . Valuables: Do not bring any jewelry or valuables. Procedure appointments are reserved for interventional treatments only. Marland Kitchen No Prescription Refills. . No medication changes will be discussed during procedure appointments. . No disability issues will be discussed.

## 2017-12-17 NOTE — Progress Notes (Signed)
Safety precautions to be maintained throughout the outpatient stay will include: orient to surroundings, keep bed in low position, maintain call bell within reach at all times, provide assistance with transfer out of bed and ambulation.  

## 2017-12-17 NOTE — Patient Instructions (Addendum)
  BMI Assessment: Estimated body mass index is 38.44 kg/m as calculated from the following:   Height as of this encounter: 5\' 5"  (1.651 m).   Weight as of this encounter: 231 lb (104.8 kg).  BMI interpretation table: BMI level Category Range association with higher incidence of chronic pain  <18 kg/m2 Underweight   18.5-24.9 kg/m2 Ideal body weight   25-29.9 kg/m2 Overweight Increased incidence by 20%  30-34.9 kg/m2 Obese (Class I) Increased incidence by 68%  35-39.9 kg/m2 Severe obesity (Class II) Increased incidence by 136%  >40 kg/m2 Extreme obesity (Class III) Increased incidence by 254%   Patient's current BMI Ideal Body weight  Body mass index is 38.44 kg/m. Ideal body weight: 57 kg (125 lb 10.6 oz) Adjusted ideal body weight: 76.1 kg (167 lb 12.8 oz)   BMI Readings from Last 4 Encounters:  12/17/17 38.44 kg/m  12/03/17 38.94 kg/m  11/25/17 38.94 kg/m  11/12/17 38.97 kg/m   Wt Readings from Last 4 Encounters:  12/17/17 231 lb (104.8 kg)  12/03/17 234 lb (106.1 kg)  11/25/17 234 lb (106.1 kg)  11/12/17 234 lb 3.2 oz (106.2 kg)  Preparing for your procedure (without sedation) Instructions: . Oral Intake: Do not eat or drink anything for at least 3 hours prior to your procedure. . Transportation: Unless otherwise stated by your physician, you may drive yourself after the procedure. . Blood Pressure Medicine: Take your blood pressure medicine with a sip of water the morning of the procedure. . Insulin: Take only  of your normal insulin dose. . Preventing infections: Shower with an antibacterial soap the morning of your procedure. . Build-up your immune system: Take 1000 mg of Vitamin C with every meal (3 times a day) the day prior to your procedure. . Pregnancy: If you are pregnant, call and cancel the procedure. . Sickness: If you have a cold, fever, or any active infections, call and cancel the procedure. . Arrival: You must be in the facility at least 30 minutes  prior to your scheduled procedure. . Children: Do not bring any children with you. . Dress appropriately: Bring dark clothing that you would not mind if they get stained. . Valuables: Do not bring any jewelry or valuables. Procedure appointments are reserved for interventional treatments only. Marland Kitchen No Prescription Refills. . No medication changes will be discussed during procedure appointments. . No disability issues will be discussed.

## 2017-12-31 ENCOUNTER — Encounter: Payer: Self-pay | Admitting: Pain Medicine

## 2017-12-31 ENCOUNTER — Other Ambulatory Visit: Payer: Self-pay

## 2017-12-31 ENCOUNTER — Ambulatory Visit
Admission: RE | Admit: 2017-12-31 | Discharge: 2017-12-31 | Disposition: A | Discharge status: Home / Self Care | Payer: Medicare Other | Source: Ambulatory Visit | Attending: Pain Medicine | Admitting: Pain Medicine

## 2017-12-31 ENCOUNTER — Ambulatory Visit (HOSPITAL_BASED_OUTPATIENT_CLINIC_OR_DEPARTMENT_OTHER): Payer: Medicare Other | Admitting: Pain Medicine

## 2017-12-31 VITALS — BP 147/82 | HR 65 | Temp 98.1°F | Resp 12 | Ht 65.0 in | Wt 232.0 lb

## 2017-12-31 DIAGNOSIS — M19071 Primary osteoarthritis, right ankle and foot: Secondary | ICD-10-CM

## 2017-12-31 DIAGNOSIS — G8929 Other chronic pain: Secondary | ICD-10-CM | POA: Insufficient documentation

## 2017-12-31 DIAGNOSIS — M25572 Pain in left ankle and joints of left foot: Secondary | ICD-10-CM

## 2017-12-31 DIAGNOSIS — M19072 Primary osteoarthritis, left ankle and foot: Secondary | ICD-10-CM

## 2017-12-31 DIAGNOSIS — M79671 Pain in right foot: Secondary | ICD-10-CM

## 2017-12-31 DIAGNOSIS — M25571 Pain in right ankle and joints of right foot: Secondary | ICD-10-CM

## 2017-12-31 MED ORDER — ROPIVACAINE HCL 2 MG/ML IJ SOLN
9.0000 mL | Freq: Once | INTRAMUSCULAR | Status: AC
  Administered 2017-12-31: 10 mL via INTRA_ARTICULAR
  Filled 2017-12-31: qty 10

## 2017-12-31 MED ORDER — LIDOCAINE HCL 2 % IJ SOLN
20.0000 mL | Freq: Once | INTRAMUSCULAR | Status: AC
  Administered 2017-12-31: 400 mg
  Filled 2017-12-31: qty 20

## 2017-12-31 MED ORDER — METHYLPREDNISOLONE ACETATE 80 MG/ML IJ SUSP
80.0000 mg | Freq: Once | INTRAMUSCULAR | Status: AC
  Administered 2017-12-31: 80 mg via INTRA_ARTICULAR
  Filled 2017-12-31: qty 1

## 2017-12-31 NOTE — Progress Notes (Addendum)
Patient's Name: Regina Campos  MRN: [id number]  Referring Provider: Lenard Simmer, MD  DOB: Jun 25, 1951  PCP: Lenard Simmer, MD  DOS: 12/31/2017  Note by: Gaspar Cola, MD  Service setting: Ambulatory outpatient  Specialty: Interventional Pain Management  Patient type: Established  Location: [facility] (AMB) Pain Management Facility  Visit type: Interventional Procedure   Primary Reason for Visit: Interventional Pain Management Treatment. CC: Foot Pain (right)  Procedure:  Anesthesia, Analgesia, Anxiolysis:  Type: Diagnostic Foot Steroid Injection Region: Dorsal Tarsometatarsal joint 4 Level: Foot Laterality: Right  Type: Local Anesthesia Indication(s): Analgesia         Local Anesthetic: Lidocaine 1-2% Route: Infiltration ([place]/IM) IV Access: Declined Sedation: Declined    Indications: 1. Chronic ankle pain (Bilateral) (R>L)   2. Chronic foot pain (Right)   3. Osteoarthritis of ankles (Bilateral)   4. Osteoarthritis of feet (Bilateral)    Pain Score: Pre-procedure: 5 /10 Post-procedure: 0-No pain/10  Pre-op Assessment:  Regina Campos is a 67 y.o. (year old), female patient, seen today for interventional treatment. She  has a past surgical history that includes Abdominal hysterectomy; Total shoulder replacement; Replacement total knee; Appendectomy; Lumbar fusion; Carpal tunnel release; Back surgery (07/25/2016); Cataract extraction w/PHACO (Right, 10/13/2019); Cataract extraction w/PHACO (Left, 11/24/2019); Total thyroidectomy; Colonoscopy (N/A, 10/21/2021); and Esophagogastroduodenoscopy (egd) with propofol (N/A, 10/21/2021). Regina Campos has a current medication list which includes the following prescription(s): levothyroxine, albuterol, budesonide-formoterol, vitamin d3, fenofibrate, furosemide, gabapentin, ipratropium-albuterol, meloxicam, naloxone, NON FORMULARY, oxygen-helium, potassium chloride, tramadol, and ubrogepant. Her primarily concern today is the Foot Pain  (right)  Initial Vital Signs:  Pulse/HCG Rate: 68ECG Heart Rate: 63 Temp: 98.1 F (36.7 C) Resp: 18 BP: (!) 141/76 SpO2: 98 %  BMI: Estimated body mass index is 38.61 kg/m as calculated from the following:   Height as of this encounter: 5\' 5"  (1.651 m).   Weight as of this encounter: 232 lb (105.2 kg).  Risk Assessment: Allergies: Reviewed. She has No Known Allergies.  Allergy Precautions: None required Coagulopathies: Reviewed. None identified.  Blood-thinner therapy: None at this time Active Infection(s): Reviewed. None identified. Regina Campos is afebrile  Site Confirmation: Regina Campos was asked to confirm the procedure and laterality before marking the site Procedure checklist: Completed Consent: Before the procedure and under the influence of no sedative(s), amnesic(s), or anxiolytics, the patient was informed of the treatment options, risks and possible complications. To fulfill our ethical and legal obligations, as recommended by the American Medical Association's Code of Ethics, I have informed the patient of my clinical impression; the nature and purpose of the treatment or procedure; the risks, benefits, and possible complications of the intervention; the alternatives, including doing nothing; the risk(s) and benefit(s) of the alternative treatment(s) or procedure(s); and the risk(s) and benefit(s) of doing nothing. The patient was provided information about the general risks and possible complications associated with the procedure. These may include, but are not limited to: failure to achieve desired goals, infection, bleeding, organ or nerve damage, allergic reactions, paralysis, and death. In addition, the patient was informed of those risks and complications associated to the procedure, such as failure to decrease pain; infection; bleeding; organ or nerve damage with subsequent damage to sensory, motor, and/or autonomic systems, resulting in permanent pain, numbness, and/or  weakness of one or several areas of the body; allergic reactions; (i.e.: anaphylactic reaction); and/or death. Furthermore, the patient was informed of those risks and complications associated with the medications. These include, but are not limited to: allergic  reactions (i.e.: anaphylactic or anaphylactoid reaction(s)); adrenal axis suppression; blood sugar elevation that in diabetics may result in ketoacidosis or comma; water retention that in patients with history of congestive heart failure may result in shortness of breath, pulmonary edema, and decompensation with resultant heart failure; weight gain; swelling or edema; medication-induced neural toxicity; particulate matter embolism and blood vessel occlusion with resultant organ, and/or nervous system infarction; and/or aseptic necrosis of one or more joints. Finally, the patient was informed that Medicine is not an exact science; therefore, there is also the possibility of unforeseen or unpredictable risks and/or possible complications that may result in a catastrophic outcome. The patient indicated having understood very clearly. We have given the patient no guarantees and we have made no promises. Enough time was given to the patient to ask questions, all of which were answered to the patient's satisfaction. Regina Campos has indicated that she wanted to continue with the procedure. Attestation: I, the ordering provider, attest that I have discussed with the patient the benefits, risks, side-effects, alternatives, likelihood of achieving goals, and potential problems during recovery for the procedure that I have provided informed consent. Date   Time: 12/31/2017 10:19 AM  Pre-Procedure Preparation:  Monitoring: As per clinic protocol. Respiration, ETCO2, SpO2, BP, heart rate and rhythm monitor placed and checked for adequate function Safety Precautions: Patient was assessed for positional comfort and pressure points before starting the  procedure. Time-out: I initiated and conducted the "Time-out" before starting the procedure, as per protocol. The patient was asked to participate by confirming the accuracy of the "Time Out" information. Verification of the correct person, site, and procedure were performed and confirmed by me, the nursing staff, and the patient. "Time-out" conducted as per Joint Commission's Universal Protocol (UP.01.01.01). Time: 1219  Description of Procedure Process:   Position: Supine Target Area:  Tarsometatarsal joint #4 (space between the cuboid and the proximal end of the 4th metatarsal joint. Approach: Dorsal approach. Area Prepped: Entire ankle & dorsal foot Region Prepping solution: ChloraPrep (2% chlorhexidine gluconate and 70% isopropyl alcohol) Safety Precautions: Aspiration looking for blood return was conducted prior to all injections. At no point did we inject any substances, as a needle was being advanced. No attempts were made at seeking any paresthesias. Safe injection practices and needle disposal techniques used. Medications properly checked for expiration dates. SDV (single dose vial) medications used. Description of the Procedure: Protocol guidelines were followed. The patient was placed in position. The target area was identified and the area prepped in the usual manner. Skin & deeper tissues infiltrated with local anesthetic. Appropriate amount of time allowed to pass for local anesthetics to take effect. The procedure needles were then advanced to the target area. Proper needle placement secured. Negative aspiration confirmed. Solution injected in intermittent fashion, asking for systemic symptoms every 0.5cc of injectate. The needles were then removed and the area cleansed, making sure to leave some of the prepping solution back to take advantage of its long term bactericidal properties.      Vitals:   12/31/17 1215 12/31/17 1220 12/31/17 1225 12/31/17 1229  BP: (!) 152/73 (!) 153/70  136/74 (!) 147/82  Pulse:      Resp: 13 13 10 12   Temp:      SpO2: 97% 99% 97% 98%  Weight:      Height:        Start Time: 1219 hrs. End Time: 1226 hrs.  Materials:  Needle(s) Type: Regular needle Gauge: 25G Length: 1.5-in Medication(s): Please see  orders for medications and dosing details.  Imaging Guidance:  Type of Imaging Technique: Fluoroscopy Guidance (Non-spinal) Indication(s): Assistance in needle guidance and placement for procedures requiring needle placement in or near specific anatomical locations not easily accessible without such assistance. Exposure Time: Please see nurses notes. Contrast: None used. Fluoroscopic Guidance: I was personally present during the use of fluoroscopy. "Tunnel Vision Technique" used to obtain the best possible view of the target area. Parallax error corrected before commencing the procedure. "Direction-depth-direction" technique used to introduce the needle under continuous pulsed fluoroscopy. Once target was reached, antero-posterior, oblique, and lateral fluoroscopic projection used confirm needle placement in all planes. Images permanently stored in EMR. Ultrasound Guidance: N/A Interpretation: No contrast injected. I personally interpreted the imaging intraoperatively. Adequate needle placement confirmed in multiple planes. Permanent images saved into the patient's record.  Antibiotic Prophylaxis:   Anti-infectives (From admission, onward)    None      Indication(s): None identified  Post-operative Assessment:  Post-procedure Vital Signs:  Pulse/HCG Rate: 6562 Temp: 98.1 F (36.7 C) Resp: 12 BP: (!) 147/82 SpO2: 98 %  EBL: None  Complications: No immediate post-treatment complications observed by team, or reported by patient.  Note: The patient tolerated the entire procedure well. A repeat set of vitals were taken after the procedure and the patient was kept under observation following institutional policy, for this type  of procedure. Post-procedural neurological assessment was performed, showing return to baseline, prior to discharge. The patient was provided with post-procedure discharge instructions, including a section on how to identify potential problems. Should any problems arise concerning this procedure, the patient was given instructions to immediately contact us, at any time, without hesitation. In any case, we plan to contact the patient by telephone for a follow-up status report regarding this interventional procedure.  Comments:  No additional relevant information.  Plan of Care  Imaging Orders         DG C-Arm 1-60 Min-No Report    Procedure Orders         Small Joint Injection/Arthrocentesis     Medications ordered for procedure: Meds ordered this encounter  Medications   lidocaine (XYLOCAINE) 2 % (with pres) injection 400 mg   ropivacaine (PF) 2 mg/mL (0.2%) (NAROPIN) injection 9 mL   methylPREDNISolone acetate (DEPO-MEDROL) injection 80 mg   Medications administered: We administered lidocaine, ropivacaine (PF) 2 mg/mL (0.2%), and methylPREDNISolone acetate.  See the medical record for exact dosing, route, and time of administration.  New Prescriptions   No medications on file   Disposition: Discharge home  Discharge Date & Time: 12/31/2017; 1230 hrs.   Physician-requested Follow-up: Return for post-procedure eval (2 wks), w/ Dionisio David, NP.  Future Appointments  Date Time Provider [place]  11/07/2021 10:30 AM Allyne Gee, MD [facility] None  12/25/2021  8:00 AM Milinda Pointer, MD [facility] None  01/16/2022 11:15 AM Allyne Gee, MD [place] None   Primary Care Physician: Lenard Simmer, MD Location: [facility] Note by: Gaspar Cola, MD Date: 12/31/2017; Time: 5:39 PM  Disclaimer:  Medicine is not an Chief Strategy Officer. The only guarantee in medicine is that nothing is guaranteed. It is important to note that the decision to  proceed with this intervention was based on the information collected from the patient. The Data and conclusions were drawn from the patient's questionnaire, the interview, and the physical examination. Because the information was provided in large part by the patient, it cannot be guaranteed that it has  not been purposely or unconsciously manipulated. Every effort has been made to obtain as much relevant data as possible for this evaluation. It is important to note that the conclusions that lead to this procedure are derived in large part from the available data. Always take into account that the treatment will also be dependent on availability of resources and existing treatment guidelines, considered by other Pain Management Practitioners as being common knowledge and practice, at the time of the intervention. For Medico-Legal purposes, it is also important to point out that variation in procedural techniques and pharmacological choices are the acceptable norm. The indications, contraindications, technique, and results of the above procedure should only be interpreted and judged by a Board-Certified Interventional Pain Specialist with extensive familiarity and expertise in the same exact procedure and technique.

## 2017-12-31 NOTE — Progress Notes (Signed)
Safety precautions to be maintained throughout the outpatient stay will include: orient to surroundings, keep bed in low position, maintain call bell within reach at all times, provide assistance with transfer out of bed and ambulation.  

## 2017-12-31 NOTE — Patient Instructions (Signed)

## 2018-01-01 ENCOUNTER — Telehealth: Payer: Self-pay

## 2018-01-01 NOTE — Telephone Encounter (Signed)
Post procedure phone call.  Left messsage.  

## 2018-01-09 DIAGNOSIS — I6502 Occlusion and stenosis of left vertebral artery: Secondary | ICD-10-CM | POA: Insufficient documentation

## 2018-01-09 DIAGNOSIS — S129XXA Fracture of neck, unspecified, initial encounter: Secondary | ICD-10-CM | POA: Insufficient documentation

## 2018-01-09 DIAGNOSIS — S129XXS Fracture of neck, unspecified, sequela: Secondary | ICD-10-CM | POA: Insufficient documentation

## 2018-01-14 ENCOUNTER — Ambulatory Visit: Admitting: Nurse Practitioner

## 2018-01-18 ENCOUNTER — Ambulatory Visit: Payer: Self-pay | Admitting: Internal Medicine

## 2018-01-29 ENCOUNTER — Encounter: Admission: RE | Payer: Self-pay | Source: Ambulatory Visit

## 2018-01-29 ENCOUNTER — Ambulatory Visit: Admission: RE | Admit: 2018-01-29 | Payer: Medicare Other | Source: Ambulatory Visit | Admitting: Gastroenterology

## 2018-01-29 SURGERY — ESOPHAGOGASTRODUODENOSCOPY (EGD) WITH PROPOFOL
Anesthesia: General

## 2018-02-22 ENCOUNTER — Ambulatory Visit: Payer: Self-pay | Admitting: Internal Medicine

## 2018-03-18 DIAGNOSIS — T148XXD Other injury of unspecified body region, subsequent encounter: Secondary | ICD-10-CM | POA: Insufficient documentation

## 2018-04-05 ENCOUNTER — Other Ambulatory Visit: Payer: Self-pay | Admitting: Student

## 2018-04-05 DIAGNOSIS — S129XXD Fracture of neck, unspecified, subsequent encounter: Secondary | ICD-10-CM

## 2018-04-05 DIAGNOSIS — R42 Dizziness and giddiness: Secondary | ICD-10-CM

## 2018-04-05 DIAGNOSIS — I6502 Occlusion and stenosis of left vertebral artery: Secondary | ICD-10-CM

## 2018-04-15 ENCOUNTER — Encounter: Admitting: Nurse Practitioner

## 2018-04-20 ENCOUNTER — Ambulatory Visit
Admission: RE | Admit: 2018-04-20 | Discharge: 2018-04-20 | Disposition: A | Discharge status: Home / Self Care | Payer: Medicare Other | Source: Ambulatory Visit | Attending: Student | Admitting: Student

## 2018-04-20 DIAGNOSIS — X58XXXD Exposure to other specified factors, subsequent encounter: Secondary | ICD-10-CM | POA: Insufficient documentation

## 2018-04-20 DIAGNOSIS — M8580 Other specified disorders of bone density and structure, unspecified site: Secondary | ICD-10-CM | POA: Insufficient documentation

## 2018-04-20 DIAGNOSIS — I6523 Occlusion and stenosis of bilateral carotid arteries: Secondary | ICD-10-CM | POA: Insufficient documentation

## 2018-04-20 DIAGNOSIS — Z981 Arthrodesis status: Secondary | ICD-10-CM | POA: Insufficient documentation

## 2018-04-20 DIAGNOSIS — S42102D Fracture of unspecified part of scapula, left shoulder, subsequent encounter for fracture with routine healing: Secondary | ICD-10-CM | POA: Insufficient documentation

## 2018-04-20 DIAGNOSIS — I6502 Occlusion and stenosis of left vertebral artery: Secondary | ICD-10-CM | POA: Diagnosis not present

## 2018-04-20 DIAGNOSIS — R42 Dizziness and giddiness: Secondary | ICD-10-CM | POA: Diagnosis not present

## 2018-04-20 DIAGNOSIS — I63542 Cerebral infarction due to unspecified occlusion or stenosis of left cerebellar artery: Secondary | ICD-10-CM | POA: Diagnosis not present

## 2018-04-20 DIAGNOSIS — S129XXD Fracture of neck, unspecified, subsequent encounter: Secondary | ICD-10-CM

## 2018-04-20 DIAGNOSIS — S2242XD Multiple fractures of ribs, left side, subsequent encounter for fracture with routine healing: Secondary | ICD-10-CM | POA: Insufficient documentation

## 2018-04-20 LAB — POCT I-STAT CREATININE: Creatinine, Ser: 0.6 mg/dL (ref 0.44–1.00)

## 2018-04-20 MED ORDER — IOHEXOL 350 MG/ML SOLN
75.0000 mL | Freq: Once | INTRAVENOUS | Status: AC | PRN
  Administered 2018-04-20: 75 mL via INTRAVENOUS

## 2018-04-27 ENCOUNTER — Telehealth: Payer: Self-pay

## 2018-04-27 NOTE — Telephone Encounter (Signed)
[facility] SIGNED CMN FOR O2. [place]

## 2018-05-03 ENCOUNTER — Ambulatory Visit: Payer: Medicare Other | Attending: Nurse Practitioner | Admitting: Nurse Practitioner

## 2018-05-03 ENCOUNTER — Encounter: Payer: Self-pay | Admitting: Nurse Practitioner

## 2018-05-03 ENCOUNTER — Other Ambulatory Visit: Payer: Self-pay

## 2018-05-03 VITALS — BP 121/79 | HR 69 | Temp 98.2°F | Ht 63.0 in | Wt 217.0 lb

## 2018-05-03 DIAGNOSIS — Z5181 Encounter for therapeutic drug level monitoring: Secondary | ICD-10-CM | POA: Insufficient documentation

## 2018-05-03 DIAGNOSIS — Z79891 Long term (current) use of opiate analgesic: Secondary | ICD-10-CM | POA: Diagnosis not present

## 2018-05-03 DIAGNOSIS — M25511 Pain in right shoulder: Secondary | ICD-10-CM | POA: Insufficient documentation

## 2018-05-03 DIAGNOSIS — M8580 Other specified disorders of bone density and structure, unspecified site: Secondary | ICD-10-CM | POA: Diagnosis not present

## 2018-05-03 DIAGNOSIS — M25551 Pain in right hip: Secondary | ICD-10-CM | POA: Insufficient documentation

## 2018-05-03 DIAGNOSIS — M25512 Pain in left shoulder: Secondary | ICD-10-CM | POA: Insufficient documentation

## 2018-05-03 DIAGNOSIS — M160 Bilateral primary osteoarthritis of hip: Secondary | ICD-10-CM | POA: Insufficient documentation

## 2018-05-03 DIAGNOSIS — M4726 Other spondylosis with radiculopathy, lumbar region: Secondary | ICD-10-CM | POA: Diagnosis not present

## 2018-05-03 DIAGNOSIS — Z90710 Acquired absence of both cervix and uterus: Secondary | ICD-10-CM | POA: Insufficient documentation

## 2018-05-03 DIAGNOSIS — G894 Chronic pain syndrome: Secondary | ICD-10-CM | POA: Diagnosis not present

## 2018-05-03 DIAGNOSIS — Z981 Arthrodesis status: Secondary | ICD-10-CM | POA: Diagnosis not present

## 2018-05-03 DIAGNOSIS — Z79899 Other long term (current) drug therapy: Secondary | ICD-10-CM | POA: Diagnosis not present

## 2018-05-03 DIAGNOSIS — M4802 Spinal stenosis, cervical region: Secondary | ICD-10-CM | POA: Insufficient documentation

## 2018-05-03 DIAGNOSIS — Z96611 Presence of right artificial shoulder joint: Secondary | ICD-10-CM | POA: Insufficient documentation

## 2018-05-03 DIAGNOSIS — M25519 Pain in unspecified shoulder: Secondary | ICD-10-CM | POA: Diagnosis present

## 2018-05-03 DIAGNOSIS — Z79890 Hormone replacement therapy (postmenopausal): Secondary | ICD-10-CM | POA: Diagnosis not present

## 2018-05-03 DIAGNOSIS — E785 Hyperlipidemia, unspecified: Secondary | ICD-10-CM | POA: Diagnosis not present

## 2018-05-03 DIAGNOSIS — I6502 Occlusion and stenosis of left vertebral artery: Secondary | ICD-10-CM | POA: Diagnosis not present

## 2018-05-03 DIAGNOSIS — E039 Hypothyroidism, unspecified: Secondary | ICD-10-CM | POA: Diagnosis not present

## 2018-05-03 DIAGNOSIS — J439 Emphysema, unspecified: Secondary | ICD-10-CM | POA: Insufficient documentation

## 2018-05-03 DIAGNOSIS — Z6838 Body mass index (BMI) 38.0-38.9, adult: Secondary | ICD-10-CM | POA: Insufficient documentation

## 2018-05-03 DIAGNOSIS — M4722 Other spondylosis with radiculopathy, cervical region: Secondary | ICD-10-CM | POA: Diagnosis not present

## 2018-05-03 DIAGNOSIS — M25552 Pain in left hip: Secondary | ICD-10-CM | POA: Diagnosis not present

## 2018-05-03 DIAGNOSIS — E669 Obesity, unspecified: Secondary | ICD-10-CM | POA: Diagnosis not present

## 2018-05-03 DIAGNOSIS — Z96659 Presence of unspecified artificial knee joint: Secondary | ICD-10-CM | POA: Insufficient documentation

## 2018-05-03 DIAGNOSIS — Z7951 Long term (current) use of inhaled steroids: Secondary | ICD-10-CM | POA: Diagnosis not present

## 2018-05-03 DIAGNOSIS — M797 Fibromyalgia: Secondary | ICD-10-CM | POA: Diagnosis not present

## 2018-05-03 DIAGNOSIS — Z8249 Family history of ischemic heart disease and other diseases of the circulatory system: Secondary | ICD-10-CM | POA: Insufficient documentation

## 2018-05-03 MED ORDER — TRAMADOL HCL 50 MG PO TABS: 100.0000 mg | ORAL_TABLET | Freq: Four times a day (QID) | ORAL | 0 refills | Status: DC | PRN

## 2018-05-03 NOTE — Progress Notes (Signed)
Nursing Pain Medication Assessment:  Safety precautions to be maintained throughout the outpatient stay will include: orient to surroundings, keep bed in low position, maintain call bell within reach at all times, provide assistance with transfer out of bed and ambulation.  Medication Inspection Compliance: Pill count conducted under aseptic conditions, in front of the patient. Neither the pills nor the bottle was removed from the patient's sight at any time. Once count was completed pills were immediately returned to the patient in their original bottle.  Medication: Tramadol (Ultram) Pill/Patch Count: 499 of 720 pills remain Pill/Patch Appearance: Markings consistent with prescribed medication Bottle Appearance: Standard pharmacy container. Clearly labeled. Filled Date: 6 / 25 / 2019 Last Medication intake:  Today

## 2018-05-03 NOTE — Patient Instructions (Signed)
____________________________________________________________________________________________  Medication Rules  Applies to: All patients receiving prescriptions (written or electronic).  Pharmacy of record: Pharmacy where electronic prescriptions will be sent. If written prescriptions are taken to a different pharmacy, please inform the nursing staff. The pharmacy listed in the electronic medical record should be the one where you would like electronic prescriptions to be sent.  Prescription refills: Only during scheduled appointments. Applies to both, written and electronic prescriptions.  NOTE: The following applies primarily to controlled substances (Opioid* Pain Medications).   Patient's responsibilities: 1. Pain Pills: Bring all pain pills to every appointment (except for procedure appointments). 2. Pill Bottles: Bring pills in original pharmacy bottle. Always bring newest bottle. Bring bottle, even if empty. 3. Medication refills: You are responsible for knowing and keeping track of what medications you need refilled. The day before your appointment, write a list of all prescriptions that need to be refilled. Bring that list to your appointment and give it to the admitting nurse. Prescriptions will be written only during appointments. If you forget a medication, it will not be "Called in", "Faxed", or "electronically sent". You will need to get another appointment to get these prescribed. 4. Prescription Accuracy: You are responsible for carefully inspecting your prescriptions before leaving our office. Have the discharge nurse carefully go over each prescription with you, before taking them home. Make sure that your name is accurately spelled, that your address is correct. Check the name and dose of your medication to make sure it is accurate. Check the number of pills, and the written instructions to make sure they are clear and accurate. Make sure that you are given enough medication to last  until your next medication refill appointment. 5. Taking Medication: Take medication as prescribed. Never take more pills than instructed. Never take medication more frequently than prescribed. Taking less pills or less frequently is permitted and encouraged, when it comes to controlled substances (written prescriptions).  6. Inform other Doctors: Always inform, all of your healthcare providers, of all the medications you take. 7. Pain Medication from other Providers: You are not allowed to accept any additional pain medication from any other Doctor or Healthcare provider. There are two exceptions to this rule. (see below) In the event that you require additional pain medication, you are responsible for notifying us, as stated below. 8. Medication Agreement: You are responsible for carefully reading and following our Medication Agreement. This must be signed before receiving any prescriptions from our practice. Safely store a copy of your signed Agreement. Violations to the Agreement will result in no further prescriptions. (Additional copies of our Medication Agreement are available upon request.) 9. Laws, Rules, & Regulations: All patients are expected to follow all Federal and State Laws, Statutes, Rules, & Regulations. Ignorance of the Laws does not constitute a valid excuse. The use of any illegal substances is prohibited. 10. Adopted CDC guidelines & recommendations: Target dosing levels will be at or below 60 MME/day. Use of benzodiazepines** is not recommended.  Exceptions: There are only two exceptions to the rule of not receiving pain medications from other Healthcare Providers. 1. Exception #1 (Emergencies): In the event of an emergency (i.e.: accident requiring emergency care), you are allowed to receive additional pain medication. However, you are responsible for: As soon as you are able, call our office [phone], at any time of the day or night, and leave a message stating your name, the  date and nature of the emergency, and the name and dose of the medication   prescribed. In the event that your call is answered by a member of our staff, make sure to document and save the date, time, and the name of the person that took your information.  2. Exception #2 (Planned Surgery): In the event that you are scheduled by another doctor or dentist to have any type of surgery or procedure, you are allowed (for a period no longer than 30 days), to receive additional pain medication, for the acute post-op pain. However, in this case, you are responsible for picking up a copy of our "Post-op Pain Management for Surgeons" handout, and giving it to your surgeon or dentist. This document is available at our office, and does not require an appointment to obtain it. Simply go to our office during business hours (Monday-Thursday from 8:00 AM to 4:00 PM) (Friday 8:00 AM to 12:00 Noon) or if you have a scheduled appointment with us, prior to your surgery, and ask for it by name. In addition, you will need to provide us with your name, name of your surgeon, type of surgery, and date of procedure or surgery.  *Opioid medications include: morphine, codeine, oxycodone, oxymorphone, hydrocodone, hydromorphone, meperidine, tramadol, tapentadol, buprenorphine, fentanyl, methadone. **Benzodiazepine medications include: diazepam (Valium), alprazolam (Xanax), clonazepam (Klonopine), lorazepam (Ativan), clorazepate (Tranxene), chlordiazepoxide (Librium), estazolam (Prosom), oxazepam (Serax), temazepam (Restoril), triazolam (Halcion) (Last updated: 10/15/2017) ____________________________________________________________________________________________    

## 2018-05-03 NOTE — Progress Notes (Signed)
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 6. Partially visible left rib fractures and ORIF. Partially visible left scapula fracture. No residual mandible fracture identified.  Electronically Signed   By: Genevie Ann M.D.   On: 04/20/2018 08:52  Complexity Note: Imaging results reviewed. Results shared with Ms. Yerkes, using Layman's terms.                         Meds   Current Outpatient Medications:  .  albuterol (PROVENTIL HFA;VENTOLIN HFA) 108 (90 Base) MCG/ACT inhaler, Inhale into the lungs., Disp: , Rfl:  .  budesonide-formoterol (SYMBICORT) 160-4.5 MCG/ACT inhaler, Inhale 2 puffs into the lungs 2 (two) times daily., Disp: , Rfl:  .  ergocalciferol (VITAMIN D2) 50000 units capsule, Take 50,000 Units by mouth once a  week., Disp: , Rfl:  .  fenofibrate (TRICOR) 145 MG tablet, Take 145 mg by mouth daily., Disp: , Rfl:  .  levothyroxine (SYNTHROID, LEVOTHROID) 125 MCG tablet, Take 125 mcg by mouth daily before breakfast., Disp: , Rfl:  .  lubiprostone (AMITIZA) 24 MCG capsule, Take 24 mcg by mouth 2 (two) times daily with a meal., Disp: , Rfl:  .  montelukast (SINGULAIR) 10 MG tablet, Take by mouth., Disp: , Rfl:  .  naloxone (NARCAN) 0.4 MG/ML injection,  Inject 1 mL (0.4 mg total) into the muscle as needed (for pain medication overdose.). Inject into thigh muscle, then call 911., Disp: 2 mL, Rfl: 0 .  nitroGLYCERIN (NITROSTAT) 0.4 MG SL tablet, TAKE ONE TABLET SUBLINGUALLY EVERY 5 MINUTES X 3 FOR CHEST PAIN, Disp: , Rfl: 0 .  rosuvastatin (CRESTOR) 10 MG tablet, Take 10 mg by mouth daily., Disp: , Rfl:  .  tiotropium (SPIRIVA) 18 MCG inhalation capsule, Place into inhaler and inhale., Disp: , Rfl:  .  [START ON 07/03/2018] traMADol (ULTRAM) 50 MG tablet, Take 2 tablets (100 mg total) by mouth every 6 (six) hours as needed for moderate pain or severe pain., Disp: 240 tablet, Rfl: 0  ROS  Constitutional: Denies any fever or chills Gastrointestinal: No reported hemesis, hematochezia, vomiting, or acute GI distress Musculoskeletal: Denies any acute onset joint swelling, redness, loss of ROM, or weakness Neurological: No reported episodes of acute onset apraxia, aphasia, dysarthria, agnosia, amnesia, paralysis, loss of coordination, or loss of consciousness  Allergies  Regina Campos has No Known Allergies.  [place]  Drug: Regina Campos  reports that she does not use drugs. Alcohol:  reports that she does not drink alcohol. Tobacco:  reports that she has never smoked. She has never used smokeless tobacco. Medical:  has a past medical history of Arthritis, Displacement of lumbar intervertebral disc (06/04/2015), History of cardiac arrhythmia (06/04/2015), History of cervical spinal surgery (06/04/2015), Hyperlipidemia,  Hypothyroidism (06/04/2015), and Thyroid disease. Surgical: Regina Campos  has a past surgical history that includes Abdominal hysterectomy; Total shoulder replacement; Replacement total knee; Appendectomy; Lumbar fusion; Carpal tunnel release; and Back surgery (07/25/2016). Family: family history includes Cancer in her mother; Heart disease in her father.  Constitutional Exam  General appearance: Well nourished, well developed, and well hydrated. In no apparent acute distress Vitals:   05/03/18 0947  BP: 121/79  Pulse: 69  Temp: 98.2 F (36.8 C)  SpO2: 97%  Weight: 217 lb (98.4 kg)  Height: '5\' 3"'$  (1.6 m)  Psych/Mental status: Alert, oriented x 3 (person, place, & time)       Eyes: PERLA Respiratory: No evidence of acute respiratory distress  Cervical Spine Area Exam  Skin & Axial Inspection: No masses, redness, edema, swelling, or associated skin lesions Alignment: Symmetrical Functional ROM: Unrestricted ROM      Stability: No instability detected Muscle Tone/Strength: Functionally intact. No obvious neuro-muscular anomalies detected. Sensory (Neurological): Unimpaired Palpation: No palpable anomalies              Upper Extremity (UE) Exam    Side: Right upper extremity  Side: Left upper extremity  Skin & Extremity Inspection: Skin color, temperature, and hair growth are WNL. No peripheral edema or cyanosis. No masses, redness, swelling, asymmetry, or associated skin lesions. No contractures.  Skin & Extremity Inspection: Evidence of prior arthroplastic surgery  Functional ROM: Unrestricted ROM          Functional ROM: Unrestricted ROM          Muscle Tone/Strength: Functionally intact. No obvious neuro-muscular anomalies detected.  Muscle Tone/Strength: Functionally intact. No obvious neuro-muscular anomalies detected.  Sensory (Neurological): Unimpaired          Sensory (Neurological): Unimpaired          Palpation: No palpable anomalies              Palpation: No palpable  anomalies              Provocative Test(s):  Phalen's test: deferred Tinel's  test: deferred Apley's scratch test (touch opposite shoulder):  Action 1 (Across chest): deferred Action 2 (Overhead): deferred Action 3 (LB reach): deferred   Provocative Test(s):  Phalen's test: deferred Tinel's test: deferred Apley's scratch test (touch opposite shoulder):  Action 1 (Across chest): deferred Action 2 (Overhead): deferred Action 3 (LB reach): deferred    Thoracic Spine Area Exam  Skin & Axial Inspection: No masses, redness, or swelling Alignment: Symmetrical Functional ROM: Unrestricted ROM Stability: No instability detected Muscle Tone/Strength: Functionally intact. No obvious neuro-muscular anomalies detected. Sensory (Neurological): Unimpaired Muscle strength & Tone: No palpable anomalies  Lumbar Spine Area Exam  Skin & Axial Inspection: No masses, redness, or swelling Alignment: Symmetrical Functional ROM: Unrestricted ROM       Stability: No instability detected Muscle Tone/Strength: Functionally intact. No obvious neuro-muscular anomalies detected. Sensory (Neurological): Unimpaired Palpation: No palpable anomalies       Provocative Tests: Hyperextension/rotation test: deferred today       Lumbar quadrant test (Kemp's test): deferred today       Lateral bending test: deferred today       Patrick's Maneuver: deferred today                   FABER test: deferred today                   S-I anterior distraction/compression test: deferred today         S-I lateral compression test: deferred today         S-I Thigh-thrust test: deferred today         S-I Gaenslen's test: deferred today          Gait & Posture Assessment  Ambulation: Unassisted Gait: Relatively normal for age and body habitus Posture: WNL   Lower Extremity Exam    Side: Right lower extremity  Side: Left lower extremity  Stability: No instability observed          Stability: No instability observed           Skin & Extremity Inspection: Skin color, temperature, and hair growth are WNL. No peripheral edema or cyanosis. No masses, redness, swelling, asymmetry, or associated skin lesions. No contractures.  Skin & Extremity Inspection: Skin color, temperature, and hair growth are WNL. No peripheral edema or cyanosis. No masses, redness, swelling, asymmetry, or associated skin lesions. No contractures.  Functional ROM: Unrestricted ROM                  Functional ROM: Unrestricted ROM                  Muscle Tone/Strength: Functionally intact. No obvious neuro-muscular anomalies detected.  Muscle Tone/Strength: Functionally intact. No obvious neuro-muscular anomalies detected.  Sensory (Neurological): Unimpaired  Sensory (Neurological): Unimpaired  Palpation: No palpable anomalies  Palpation: No palpable anomalies   Assessment  Primary Diagnosis & Pertinent Problem List: The primary encounter diagnosis was Lumbar spondylosis with radicular symptoms. Diagnoses of Cervical spondylosis with radiculopathy (Right side), Fibromyalgia, and Chronic pain syndrome were also pertinent to this visit.  Status Diagnosis  Controlled Controlled Controlled 1. Lumbar spondylosis with radicular symptoms   2. Cervical spondylosis with radiculopathy (Right side)   3. Fibromyalgia   4. Chronic pain syndrome     Problems updated and reviewed during this visit: Problem  Wound Healing, Delayed  Closed Fracture of Cervical Vertebra (Hcc)   C5-7 left TP   Occlusion of Left Vertebral Artery   Plan of Care  Pharmacotherapy (Medications Ordered): Meds ordered this encounter  Medications  . traMADol (ULTRAM) 50 MG tablet    Sig: Take 2 tablets (100 mg total) by mouth every 6 (six) hours as needed for moderate pain or severe pain.    Dispense:  240 tablet    Refill:  0    Fill one day early if pharmacy is closed on scheduled refill date. Please fill on 07/03/2018    Order Specific Question:   Supervising Provider     Answer:   Milinda Pointer [phone]   New Prescriptions   No medications on file   Medications administered today: Donne Anon. Brummitt had no medications administered during this visit. Lab-work, procedure(s), and/or referral(s): No orders of the defined types were placed in this encounter.  Imaging and/or referral(s): None  Interventional therapies: Planned, scheduled, and/or pending: Not at this time.   Considering:  Diagnostic bilateral suprascapular NB Possiblebilateral suprascapular RFA Diagnostic Left intra-articular shoulder joint injection Bilateral intra-articular Hip injection Palliative right-sided L3-4 lumbar epiduralsteroid injection Palliative right-sided L4-5 transforaminal epidural steroid injection Palliative right-sided L2-3 lumbar epiduralsteroid injection Palliative right-sided C7-T1 cervical epiduralsteroid injection Diagnostic bilateral cervical facet block Possible bilateral cervical facet radiofrequencyablation Diagnostic bilateral lumbar facet block Possible bilateral lumbar facet radiofrequencyablation   Palliative PRN treatment(s):  Palliative right-sided L3-4 lumbar epiduralsteroid injection Palliative right-sided L4-5 transforaminal epiduralsteroid injection Palliative right-sided L2-3 lumbar epiduralsteroid injection Palliative right-sided C7-T1 cervical epiduralsteroid injection Palliativebilateral shoulder joint injection Palliativebilateral suprascapular nerve block    Provider-requested follow-up: Return in about 3 months (around 08/02/2018) for MedMgmt with Me Donella Stade Edison Pace).  Future Appointments  Date Time Provider [place]  06/23/2018  9:45 AM [facility] None  08/02/2018  9:30 AM Vevelyn Francois, NP [facility] None   Primary Care Physician: Lenard Simmer, MD Location: [facility] Note by: Vevelyn Francois NP Date: 05/03/2018; Time: 2:54 PM  Pain Score  Disclaimer: We use the NRS-11 scale. This is a self-reported, subjective measurement of pain severity with only modest accuracy. It is used primarily to identify changes within a particular patient. It must be understood that outpatient pain scales are significantly less accurate that those used for research, where they can be applied under ideal controlled circumstances with minimal exposure to variables. In reality, the score is likely to be a combination of pain intensity and pain affect, where pain affect describes the degree of emotional arousal or changes in action readiness caused by the sensory experience of pain. Factors such as social and work situation, setting, emotional state, anxiety levels, expectation, and prior pain experience may influence pain perception and show large inter-individual differences that may also be affected by time variables.  Patient instructions provided during this appointment: Patient Instructions  ____________________________________________________________________________________________  Medication Rules  Applies to: All patients receiving prescriptions (written or electronic).  Pharmacy of record: Pharmacy where electronic prescriptions will be sent. If written prescriptions are taken to a different pharmacy, please inform the nursing staff. The pharmacy listed in the electronic medical record should be the one where you would like electronic prescriptions to be sent.  Prescription refills: Only during scheduled appointments. Applies to both, written and electronic prescriptions.  NOTE: The following applies primarily to controlled substances (Opioid* Pain Medications).   Patient's responsibilities: 1. Pain Pills: Bring all pain pills to every appointment (except for procedure appointments). 2. Pill Bottles: Bring pills in original pharmacy bottle. Always bring newest bottle. Bring bottle, even if empty. 3. Medication refills: You are responsible for  knowing and keeping track of what medications you need refilled. The day before your appointment, write a list of all prescriptions that need to be refilled. Bring that list to your appointment and give it to the admitting nurse. Prescriptions will be written only during appointments. If you forget a medication, it will not be "Called in", "Faxed", or "electronically sent". You will need to get another appointment to get these prescribed. 4. Prescription Accuracy: You are responsible for carefully inspecting your prescriptions before leaving our office. Have the discharge nurse carefully go over each prescription with you, before taking them home. Make sure that your name is accurately spelled, that your address is correct. Check the name and dose of your medication to make sure it is accurate. Check the number of pills, and the written instructions to make sure they are clear and accurate. Make sure that you are given enough medication to last until your next medication refill appointment. 5. Taking Medication: Take medication as prescribed. Never take more pills than instructed. Never take medication more frequently than prescribed. Taking less pills or less frequently is permitted and encouraged, when it comes to controlled substances (written prescriptions).  6. Inform other Doctors: Always inform, all of your healthcare providers, of all the medications you take. 7. Pain Medication from other Providers: You are not allowed to accept any additional pain medication from any other Doctor or Healthcare provider. There are two exceptions to this rule. (see below) In the event that you require additional pain medication, you are responsible for notifying us, as stated below. 8. Medication Agreement: You are responsible for carefully reading and following our Medication Agreement. This must be signed before receiving any prescriptions from our practice. Safely store a copy of your signed Agreement. Violations to  the Agreement will result in no further prescriptions. (Additional copies of our Medication Agreement are available upon request.) 9. Laws, Rules, & Regulations: All patients are expected to follow all Federal and [employer], Rules, [employer]. Ignorance of the Laws does not constitute a valid excuse. The use of any illegal substances is prohibited. 10. Adopted CDC guidelines & recommendations: Target dosing levels will be at or below 60 MME/day. Use of benzodiazepines** is not recommended.  Exceptions: There are only two exceptions to the rule of not receiving pain medications from other Healthcare Providers. 1. Exception #1 (Emergencies): In the event of an emergency (i.e.: accident requiring emergency care), you are allowed to receive additional pain medication. However, you are responsible for: As soon as you are able, call our office [phone], at any time of the day or night, and leave a message stating your name, the date and nature of the emergency, and the name and dose of the medication prescribed. In the event that your call is answered by a member of our staff, make sure to document and save the date, time, and the name of the person that took your information.  2. Exception #2 (Planned Surgery): In the event that you are scheduled by another doctor or dentist to have any type of surgery or procedure, you are allowed (for a period no longer than 30 days), to receive additional pain medication, for the acute post-op pain. However, in this case, you are responsible for picking up a copy of our "Post-op Pain Management for Surgeons" handout, and giving it to your surgeon or dentist. This document is available at our office, and does not require an appointment to obtain it. Simply go to our office  during business hours (Monday-Thursday from 8:00 AM to 4:00 PM) (Friday 8:00 AM to 12:00 Noon) or if you have a scheduled appointment with [place], prior to your surgery, and ask for it by name.  In addition, you will need to provide [place] with your name, name of your surgeon, type of surgery, and date of procedure or surgery.  *Opioid medications include: morphine, codeine, oxycodone, oxymorphone, hydrocodone, hydromorphone, meperidine, tramadol, tapentadol, buprenorphine, fentanyl, methadone. **Benzodiazepine medications include: diazepam (Valium), alprazolam (Xanax), clonazepam (Klonopine), lorazepam (Ativan), clorazepate (Tranxene), chlordiazepoxide (Librium), estazolam (Prosom), oxazepam (Serax), temazepam (Restoril), triazolam (Halcion) (Last updated: 10/15/2017) ____________________________________________________________________________________________

## 2018-05-18 DIAGNOSIS — M792 Neuralgia and neuritis, unspecified: Secondary | ICD-10-CM | POA: Insufficient documentation

## 2018-05-18 DIAGNOSIS — L910 Hypertrophic scar: Secondary | ICD-10-CM | POA: Insufficient documentation

## 2018-05-18 DIAGNOSIS — L299 Pruritus, unspecified: Secondary | ICD-10-CM | POA: Insufficient documentation

## 2018-05-18 DIAGNOSIS — S41112A Laceration without foreign body of left upper arm, initial encounter: Secondary | ICD-10-CM | POA: Insufficient documentation

## 2018-05-18 DIAGNOSIS — L905 Scar conditions and fibrosis of skin: Secondary | ICD-10-CM | POA: Insufficient documentation

## 2018-06-10 ENCOUNTER — Telehealth: Payer: Self-pay | Admitting: Internal Medicine

## 2018-06-10 NOTE — Telephone Encounter (Signed)
MAILED MEDICAL OFFICE VISIT NOTE FOR CPAP SUPPLIES TO [facility], [place], [place] [address].JW

## 2018-06-23 ENCOUNTER — Ambulatory Visit (INDEPENDENT_AMBULATORY_CARE_PROVIDER_SITE_OTHER): Payer: Medicare Other

## 2018-06-23 DIAGNOSIS — G4733 Obstructive sleep apnea (adult) (pediatric): Secondary | ICD-10-CM | POA: Diagnosis not present

## 2018-06-23 NOTE — Progress Notes (Signed)
95 percentile pressure 13.7  95th percentile leak 14.3   apnea index 1.0 /hr  apnea-hypopnea index  1.3 /hr   total days used  >4 hr 88 days  total days used <4 hr 2 days  Total compliance 98 percent  No problems or questions at this time

## 2018-07-19 ENCOUNTER — Ambulatory Visit (INDEPENDENT_AMBULATORY_CARE_PROVIDER_SITE_OTHER): Payer: Medicare Other | Admitting: Internal Medicine

## 2018-07-19 ENCOUNTER — Encounter: Payer: Self-pay | Admitting: Internal Medicine

## 2018-07-19 VITALS — BP 132/78 | HR 67 | Resp 16 | Ht 65.0 in | Wt 213.0 lb

## 2018-07-19 DIAGNOSIS — J0110 Acute frontal sinusitis, unspecified: Secondary | ICD-10-CM | POA: Diagnosis not present

## 2018-07-19 DIAGNOSIS — Z9989 Dependence on other enabling machines and devices: Secondary | ICD-10-CM | POA: Diagnosis not present

## 2018-07-19 DIAGNOSIS — R0602 Shortness of breath: Secondary | ICD-10-CM | POA: Diagnosis not present

## 2018-07-19 DIAGNOSIS — I6502 Occlusion and stenosis of left vertebral artery: Secondary | ICD-10-CM

## 2018-07-19 DIAGNOSIS — G4733 Obstructive sleep apnea (adult) (pediatric): Secondary | ICD-10-CM | POA: Diagnosis not present

## 2018-07-19 MED ORDER — AMOXICILLIN-POT CLAVULANATE 875-125 MG PO TABS: 1.0000 | ORAL_TABLET | Freq: Two times a day (BID) | ORAL | 0 refills | Status: DC

## 2018-07-19 NOTE — Progress Notes (Signed)
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 not just to her but also people around her. she has been encouraged to call the office with any questions or concerns that should arise related to todays visit.    Time spent: 25 This patient was seen by Orson Gear AGNP-C in Collaboration with Dr. Devona Konig as a part of collaborative care agreement.   I have personally obtained a history, examined the patient, evaluated laboratory and imaging results, formulated the assessment and plan and placed orders.    Allyne Gee, MD [facility] Sleep medicine

## 2018-07-19 NOTE — Patient Instructions (Signed)
Sinusitis, Adult Sinusitis is soreness and inflammation of your sinuses. Sinuses are hollow spaces in the bones around your face. They are located:  Around your eyes.  In the middle of your forehead.  Behind your nose.  In your cheekbones.  Your sinuses and nasal passages are lined with a stringy fluid (mucus). Mucus normally drains out of your sinuses. When your nasal tissues get inflamed or swollen, the mucus can get trapped or blocked so air cannot flow through your sinuses. This lets bacteria, viruses, and funguses grow, and that leads to infection. Follow these instructions at home: Medicines  Take, use, or apply over-the-counter and prescription medicines only as told by your doctor. These may include nasal sprays.  If you were prescribed an antibiotic medicine, take it as told by your doctor. Do not stop taking the antibiotic even if you start to feel better. Hydrate and Humidify  Drink enough water to keep your pee (urine) clear or pale yellow.  Use a cool mist humidifier to keep the humidity level in your home above 50%.  Breathe in steam for 10-15 minutes, 3-4 times a day or as told by your doctor. You can do this in the bathroom while a hot shower is running.  Try not to spend time in cool or dry air. Rest  Rest as much as possible.  Sleep with your head raised (elevated).  Make sure to get enough sleep each night. General instructions  Put a warm, moist washcloth on your face 3-4 times a day or as told by your doctor. This will help with discomfort.  Wash your hands often with soap and water. If there is no soap and water, use hand sanitizer.  Do not smoke. Avoid being around people who are smoking (secondhand smoke).  Keep all follow-up visits as told by your doctor. This is important. Contact a doctor if:  You have a fever.  Your symptoms get worse.  Your symptoms do not get better within 10 days. Get help right away if:  You have a very bad  headache.  You cannot stop throwing up (vomiting).  You have pain or swelling around your face or eyes.  You have trouble seeing.  You feel confused.  Your neck is stiff.  You have trouble breathing. This information is not intended to replace advice given to you by your health care provider. Make sure you discuss any questions you have with your health care provider. Document Released: 01/21/2008 Document Revised: 03/30/2016 Document Reviewed: 05/30/2015 Elsevier Interactive Patient Education  2018 [place]. Sleep Apnea Sleep apnea is a condition that affects breathing. People with sleep apnea have moments during sleep when their breathing pauses briefly or gets shallow. Sleep apnea can cause these symptoms:  Trouble staying asleep.  Sleepiness or tiredness during the day.  Irritability.  Loud snoring.  Morning headaches.  Trouble concentrating.  Forgetting things.  Less interest in sex.  Being sleepy for no reason.  Mood swings.  Personality changes.  Depression.  Waking up a lot during the night to pee (urinate).  Dry mouth.  Sore throat.  Follow these instructions at home:  Make any changes in your routine that your doctor recommends.  Eat a healthy, well-balanced diet.  Take over-the-counter and prescription medicines only as told by your doctor.  Avoid using alcohol, calming medicines (sedatives), and narcotic medicines.  Take steps to lose weight if you are overweight.  If you were given a machine (device) to use while you sleep, use it  only as told by your doctor.  Do not use any tobacco products, such as cigarettes, chewing tobacco, and e-cigarettes. If you need help quitting, ask your doctor.  Keep all follow-up visits as told by your doctor. This is important. Contact a doctor if:  The machine that you were given to use during sleep is uncomfortable or does not seem to be working.  Your symptoms do not get better.  Your symptoms  get worse. Get help right away if:  Your chest hurts.  You have trouble breathing in enough air (shortness of breath).  You have an uncomfortable feeling in your back, arms, or stomach.  You have trouble talking.  One side of your body feels weak.  A part of your face is hanging down (drooping). These symptoms may be an emergency. Do not wait to see if the symptoms will go away. Get medical help right away. Call your local emergency services (911 in the [place] Do not drive yourself to the hospital. This information is not intended to replace advice given to you by your health care provider. Make sure you discuss any questions you have with your health care provider. Document Released: 05/13/2008 Document Revised: 03/30/2016 Document Reviewed: 05/14/2015 Elsevier Interactive Patient Education  [employer].

## 2018-08-02 ENCOUNTER — Other Ambulatory Visit: Payer: Self-pay

## 2018-08-02 ENCOUNTER — Ambulatory Visit: Payer: Medicare Other | Attending: Nurse Practitioner | Admitting: Nurse Practitioner

## 2018-08-02 ENCOUNTER — Encounter: Payer: Self-pay | Admitting: Nurse Practitioner

## 2018-08-02 VITALS — BP 149/77 | HR 66 | Temp 97.7°F | Ht 64.0 in | Wt 213.0 lb

## 2018-08-02 DIAGNOSIS — I700 Atherosclerosis of aorta: Secondary | ICD-10-CM | POA: Insufficient documentation

## 2018-08-02 DIAGNOSIS — Z6839 Body mass index (BMI) 39.0-39.9, adult: Secondary | ICD-10-CM | POA: Diagnosis not present

## 2018-08-02 DIAGNOSIS — G4486 Cervicogenic headache: Secondary | ICD-10-CM

## 2018-08-02 DIAGNOSIS — Z8249 Family history of ischemic heart disease and other diseases of the circulatory system: Secondary | ICD-10-CM | POA: Insufficient documentation

## 2018-08-02 DIAGNOSIS — E785 Hyperlipidemia, unspecified: Secondary | ICD-10-CM | POA: Diagnosis not present

## 2018-08-02 DIAGNOSIS — S41112A Laceration without foreign body of left upper arm, initial encounter: Secondary | ICD-10-CM | POA: Insufficient documentation

## 2018-08-02 DIAGNOSIS — S2221XA Fracture of manubrium, initial encounter for closed fracture: Secondary | ICD-10-CM | POA: Insufficient documentation

## 2018-08-02 DIAGNOSIS — E669 Obesity, unspecified: Secondary | ICD-10-CM | POA: Insufficient documentation

## 2018-08-02 DIAGNOSIS — Z7982 Long term (current) use of aspirin: Secondary | ICD-10-CM | POA: Insufficient documentation

## 2018-08-02 DIAGNOSIS — M25519 Pain in unspecified shoulder: Secondary | ICD-10-CM | POA: Diagnosis present

## 2018-08-02 DIAGNOSIS — M4726 Other spondylosis with radiculopathy, lumbar region: Secondary | ICD-10-CM | POA: Diagnosis not present

## 2018-08-02 DIAGNOSIS — Z96611 Presence of right artificial shoulder joint: Secondary | ICD-10-CM | POA: Insufficient documentation

## 2018-08-02 DIAGNOSIS — M19071 Primary osteoarthritis, right ankle and foot: Secondary | ICD-10-CM | POA: Diagnosis not present

## 2018-08-02 DIAGNOSIS — L299 Pruritus, unspecified: Secondary | ICD-10-CM | POA: Diagnosis not present

## 2018-08-02 DIAGNOSIS — Z79899 Other long term (current) drug therapy: Secondary | ICD-10-CM | POA: Diagnosis not present

## 2018-08-02 DIAGNOSIS — M19011 Primary osteoarthritis, right shoulder: Secondary | ICD-10-CM | POA: Insufficient documentation

## 2018-08-02 DIAGNOSIS — S42102A Fracture of unspecified part of scapula, left shoulder, initial encounter for closed fracture: Secondary | ICD-10-CM | POA: Insufficient documentation

## 2018-08-02 DIAGNOSIS — E039 Hypothyroidism, unspecified: Secondary | ICD-10-CM | POA: Diagnosis not present

## 2018-08-02 DIAGNOSIS — R51 Headache: Secondary | ICD-10-CM | POA: Diagnosis not present

## 2018-08-02 DIAGNOSIS — I6523 Occlusion and stenosis of bilateral carotid arteries: Secondary | ICD-10-CM | POA: Insufficient documentation

## 2018-08-02 DIAGNOSIS — F329 Major depressive disorder, single episode, unspecified: Secondary | ICD-10-CM | POA: Diagnosis not present

## 2018-08-02 DIAGNOSIS — Z791 Long term (current) use of non-steroidal anti-inflammatories (NSAID): Secondary | ICD-10-CM | POA: Insufficient documentation

## 2018-08-02 DIAGNOSIS — S2242XA Multiple fractures of ribs, left side, initial encounter for closed fracture: Secondary | ICD-10-CM | POA: Insufficient documentation

## 2018-08-02 DIAGNOSIS — E7800 Pure hypercholesterolemia, unspecified: Secondary | ICD-10-CM | POA: Insufficient documentation

## 2018-08-02 DIAGNOSIS — M4722 Other spondylosis with radiculopathy, cervical region: Secondary | ICD-10-CM

## 2018-08-02 DIAGNOSIS — M160 Bilateral primary osteoarthritis of hip: Secondary | ICD-10-CM | POA: Insufficient documentation

## 2018-08-02 DIAGNOSIS — Z79891 Long term (current) use of opiate analgesic: Secondary | ICD-10-CM

## 2018-08-02 DIAGNOSIS — J439 Emphysema, unspecified: Secondary | ICD-10-CM | POA: Diagnosis not present

## 2018-08-02 DIAGNOSIS — K5903 Drug induced constipation: Secondary | ICD-10-CM | POA: Diagnosis not present

## 2018-08-02 DIAGNOSIS — G894 Chronic pain syndrome: Secondary | ICD-10-CM

## 2018-08-02 DIAGNOSIS — M48061 Spinal stenosis, lumbar region without neurogenic claudication: Secondary | ICD-10-CM | POA: Diagnosis not present

## 2018-08-02 DIAGNOSIS — M8580 Other specified disorders of bone density and structure, unspecified site: Secondary | ICD-10-CM | POA: Insufficient documentation

## 2018-08-02 DIAGNOSIS — M65341 Trigger finger, right ring finger: Secondary | ICD-10-CM | POA: Insufficient documentation

## 2018-08-02 DIAGNOSIS — M19072 Primary osteoarthritis, left ankle and foot: Secondary | ICD-10-CM | POA: Insufficient documentation

## 2018-08-02 DIAGNOSIS — Z981 Arthrodesis status: Secondary | ICD-10-CM | POA: Insufficient documentation

## 2018-08-02 DIAGNOSIS — M4802 Spinal stenosis, cervical region: Secondary | ICD-10-CM | POA: Diagnosis not present

## 2018-08-02 DIAGNOSIS — M797 Fibromyalgia: Secondary | ICD-10-CM | POA: Insufficient documentation

## 2018-08-02 DIAGNOSIS — G4733 Obstructive sleep apnea (adult) (pediatric): Secondary | ICD-10-CM | POA: Insufficient documentation

## 2018-08-02 DIAGNOSIS — L298 Other pruritus: Secondary | ICD-10-CM | POA: Insufficient documentation

## 2018-08-02 MED ORDER — TRAMADOL HCL 50 MG PO TABS: 100.0000 mg | ORAL_TABLET | Freq: Four times a day (QID) | ORAL | 2 refills | Status: DC | PRN

## 2018-08-02 NOTE — Patient Instructions (Signed)
____________________________________________________________________________________________  Medication Rules  Purpose: To inform patients, and their family members, of our rules and regulations.  Applies to: All patients receiving prescriptions (written or electronic).  Pharmacy of record: Pharmacy where electronic prescriptions will be sent. If written prescriptions are taken to a different pharmacy, please inform the nursing staff. The pharmacy listed in the electronic medical record should be the one where you would like electronic prescriptions to be sent.  Electronic prescriptions: In compliance with the [place] Strengthen Opioid Misuse Prevention (STOP) Act of 2017 (Session Law 2017-74/H243), effective August 18, 2018, all controlled substances must be electronically prescribed. Calling prescriptions to the pharmacy will cease to exist.  Prescription refills: Only during scheduled appointments. Applies to all prescriptions.  NOTE: The following applies primarily to controlled substances (Opioid* Pain Medications).   Patient's responsibilities: 1. Pain Pills: Bring all pain pills to every appointment (except for procedure appointments). 2. Pill Bottles: Bring pills in original pharmacy bottle. Always bring the newest bottle. Bring bottle, even if empty. 3. Medication refills: You are responsible for knowing and keeping track of what medications you take and those you need refilled. The day before your appointment: write a list of all prescriptions that need to be refilled. The day of the appointment: give the list to the admitting nurse. Prescriptions will be written only during appointments. If you forget a medication: it will not be "Called in", "Faxed", or "electronically sent". You will need to get another appointment to get these prescribed. No early refills. Do not call asking to have your prescription filled early. 4. Prescription Accuracy: You are responsible for  carefully inspecting your prescriptions before leaving our office. Have the discharge nurse carefully go over each prescription with you, before taking them home. Make sure that your name is accurately spelled, that your address is correct. Check the name and dose of your medication to make sure it is accurate. Check the number of pills, and the written instructions to make sure they are clear and accurate. Make sure that you are given enough medication to last until your next medication refill appointment. 5. Taking Medication: Take medication as prescribed. When it comes to controlled substances, taking less pills or less frequently than prescribed is permitted and encouraged. Never take more pills than instructed. Never take medication more frequently than prescribed.  6. Inform other Doctors: Always inform, all of your healthcare providers, of all the medications you take. 7. Pain Medication from other Providers: You are not allowed to accept any additional pain medication from any other Doctor or Healthcare provider. There are two exceptions to this rule. (see below) In the event that you require additional pain medication, you are responsible for notifying us, as stated below. 8. Medication Agreement: You are responsible for carefully reading and following our Medication Agreement. This must be signed before receiving any prescriptions from our practice. Safely store a copy of your signed Agreement. Violations to the Agreement will result in no further prescriptions. (Additional copies of our Medication Agreement are available upon request.) 9. Laws, Rules, & Regulations: All patients are expected to follow all Federal and State Laws, Statutes, Rules, & Regulations. Ignorance of the Laws does not constitute a valid excuse. The use of any illegal substances is prohibited. 10. Adopted CDC guidelines & recommendations: Target dosing levels will be at or below 60 MME/day. Use of benzodiazepines** is not  recommended.  Exceptions: There are only two exceptions to the rule of not receiving pain medications from other Healthcare Providers. 1.   Exception #1 (Emergencies): In the event of an emergency (i.e.: accident requiring emergency care), you are allowed to receive additional pain medication. However, you are responsible for: As soon as you are able, call our office [phone], at any time of the day or night, and leave a message stating your name, the date and nature of the emergency, and the name and dose of the medication prescribed. In the event that your call is answered by a member of our staff, make sure to document and save the date, time, and the name of the person that took your information.  2. Exception #2 (Planned Surgery): In the event that you are scheduled by another doctor or dentist to have any type of surgery or procedure, you are allowed (for a period no longer than 30 days), to receive additional pain medication, for the acute post-op pain. However, in this case, you are responsible for picking up a copy of our "Post-op Pain Management for Surgeons" handout, and giving it to your surgeon or dentist. This document is available at our office, and does not require an appointment to obtain it. Simply go to our office during business hours (Monday-Thursday from 8:00 AM to 4:00 PM) (Friday 8:00 AM to 12:00 Noon) or if you have a scheduled appointment with us, prior to your surgery, and ask for it by name. In addition, you will need to provide us with your name, name of your surgeon, type of surgery, and date of procedure or surgery.  *Opioid medications include: morphine, codeine, oxycodone, oxymorphone, hydrocodone, hydromorphone, meperidine, tramadol, tapentadol, buprenorphine, fentanyl, methadone. **Benzodiazepine medications include: diazepam (Valium), alprazolam (Xanax), clonazepam (Klonopine), lorazepam (Ativan), clorazepate (Tranxene), chlordiazepoxide (Librium), estazolam (Prosom),  oxazepam (Serax), temazepam (Restoril), triazolam (Halcion) (Last updated: 10/15/2017) ____________________________________________________________________________________________    

## 2018-08-02 NOTE — Progress Notes (Signed)
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 nonspecific but could be the sequelae of the left vertebral artery injury in #1. No superimposed acute intracranial abnormality. 5. No residual cervical spine fracture is evident. There is osteopenia and multilevel prior cervical ACDF. 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 6. Partially visible left rib fractures and ORIF. Partially visible left scapula fracture. No residual mandible fracture identified.  Electronically Signed   By: Genevie Ann M.D.   On: 04/20/2018 08:52  Complexity Note: Imaging results reviewed. Results shared with Regina Campos, using Layman's terms.                         Meds   Current Outpatient Medications:  .  albuterol (PROVENTIL HFA;VENTOLIN HFA) 108  (90 Base) MCG/ACT inhaler, Inhale into the lungs., Disp: , Rfl:  .  budesonide-formoterol (SYMBICORT) 160-4.5 MCG/ACT inhaler, Inhale 2 puffs into the lungs 2 (two) times daily., Disp: , Rfl:  .  ergocalciferol (VITAMIN D2) 50000 units capsule, Take 50,000 Units by mouth once a week., Disp: , Rfl:  .  levothyroxine (SYNTHROID, LEVOTHROID) 125 MCG tablet, Take 125 mcg by mouth daily before breakfast., Disp: , Rfl:  .  montelukast (SINGULAIR) 10 MG tablet, Take by mouth., Disp: , Rfl:  .  nitroGLYCERIN (NITROSTAT) 0.4 MG SL tablet, TAKE ONE TABLET SUBLINGUALLY EVERY 5  MINUTES X 3 FOR CHEST PAIN, Disp: , Rfl: 0 .  Omega-3 Fatty Acids (FISH OIL) 1000 MG CAPS, Take by mouth., Disp: , Rfl:  .  tiotropium (SPIRIVA) 18 MCG inhalation capsule, Place into inhaler and inhale., Disp: , Rfl:  .  traMADol (ULTRAM) 50 MG tablet, Take 2 tablets (100 mg total) by mouth every 6 (six) hours as needed for moderate pain or severe pain., Disp: 240 tablet, Rfl: 2  ROS  Constitutional: Denies any fever or chills Gastrointestinal: No reported hemesis, hematochezia, vomiting, or acute GI distress Musculoskeletal: Denies any acute onset joint swelling, redness, loss of ROM, or weakness Neurological: No reported episodes of acute onset apraxia, aphasia, dysarthria, agnosia, amnesia, paralysis, loss of coordination, or loss of consciousness  Allergies  Regina Campos has No Known Allergies.  [place]  Drug: Regina Campos  reports no history of drug use. Alcohol:  reports no history of alcohol use. Tobacco:  reports that she has never smoked. She has never used smokeless tobacco. Medical:  has a past medical history of Arthritis, Displacement of lumbar intervertebral disc (06/04/2015), History of cardiac arrhythmia (06/04/2015), History of cervical spinal surgery (06/04/2015), Hyperlipidemia, Hypothyroidism (06/04/2015), and Thyroid disease. Surgical: Regina Campos  has a past surgical history that includes Abdominal hysterectomy;  Total shoulder replacement; Replacement total knee; Appendectomy; Lumbar fusion; Carpal tunnel release; and Back surgery (07/25/2016). Family: family history includes Cancer in her mother; Heart disease in her father.  Constitutional Exam  General appearance: Well nourished, well developed, and well hydrated. In no apparent acute distress Vitals:   08/02/18 0951  BP: (!) 149/77  Pulse: 66  Temp: 97.7 F (36.5 C)  SpO2: 97%  Weight: 213 lb (96.6 kg)  Height: '5\' 4"'  (1.626 m)  Psych/Mental status: Alert, oriented x 3 (person, place, & time)       Eyes: PERLA Respiratory: No evidence of acute respiratory distress  Cervical Spine Area Exam  Skin & Axial Inspection: Well healed scar from previous spine surgery detected Alignment: Symmetrical Functional ROM: Unrestricted ROM      Stability: No instability detected Muscle Tone/Strength: Functionally intact. No obvious neuro-muscular anomalies detected. Sensory (Neurological): Unimpaired Palpation: Tender              Upper Extremity (UE) Exam    Side: Right upper extremity  Side: Left upper extremity  Skin & Extremity Inspection: Skin color, temperature, and hair growth are WNL. No peripheral edema or cyanosis. No masses, redness, swelling, asymmetry, or associated skin lesions. No contractures.  Skin & Extremity Inspection: Evidence of prior surgery  Functional ROM: Adequate ROM          Functional ROM: Adequate ROM          Muscle Tone/Strength: Functionally intact. No obvious neuro-muscular anomalies detected.  Muscle Tone/Strength: Functionally intact. No obvious neuro-muscular anomalies detected.  Sensory (Neurological): Unimpaired          Sensory (Neurological): Unimpaired          Palpation: No palpable anomalies              Palpation: No palpable anomalies                Gait & Posture Assessment  Ambulation: Unassisted Gait: Relatively normal for age and body habitus Posture: WNL    Assessment  Primary Diagnosis &  Pertinent Problem List: The primary encounter diagnosis was Cervical spondylosis with radiculopathy (Right side). 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  Status Diagnosis  Persistent Worsening Controlled 1. Cervical spondylosis with radiculopathy (Right side)   2. Cervicogenic headache   3. Fibromyalgia   4. 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 Chronic pain syndrome   6. Long term current use of opiate analgesic     Problems updated and reviewed during this visit: Problem  Hypothyroidism  Hypertrophic Scar   Added automatically from request for surgery [id number]   Laceration of Left Arm With Complication   Added automatically from request for surgery [id number]   Neuropathic Pain   Added automatically from request for surgery [id number]   Pruritus of Skin   Added automatically from request for surgery [id number]   Scar Condition and Fibrosis of Skin   Added automatically from request for surgery [id number]    Plan of Care  Pharmacotherapy (Medications Ordered): Meds ordered this encounter  Medications  . traMADol (ULTRAM) 50 MG tablet    Sig: Take 2 tablets (100 mg total) by mouth every 6 (six) hours as needed for moderate pain or severe pain.    Dispense:  240 tablet    Refill:  2    Do not place this medication, or any other prescription from our practice, on "Automatic Refill". Patient may have prescription filled one day early if pharmacy is closed on scheduled refill date.    Order Specific Question:   Supervising Provider    Answer:   Milinda Pointer [[id number]]   New Prescriptions   No medications on file   Medications administered today: Regina Campos had no medications administered during this visit. Lab-work, procedure(s), and/or referral(s): Orders Placed This Encounter   Procedures  . ToxASSURE Select 13 (MW), Urine   Imaging and/or referral(s): None Interventional therapies: Planned, scheduled, and/or pending: Not at this time. Encouraged pt to continue to monitor headache if continue may consider additional therapy    Considering:  Diagnostic bilateral suprascapular NB Possiblebilateral suprascapular RFA Diagnostic Left intra-articular shoulder joint injection Bilateral intra-articular Hip injection Palliative right-sided L3-4 lumbar epiduralsteroid injection Palliative right-sided L4-5 transforaminal epidural steroid injection Palliative right-sided L2-3 lumbar epiduralsteroid injection Palliative right-sided C7-T1 cervical epiduralsteroid injection Diagnostic bilateral cervical facet block Possible bilateral cervical facet radiofrequencyablation Diagnostic bilateral lumbar facet block Possible bilateral lumbar facet radiofrequencyablation   Palliative PRN treatment(s):  Palliative right-sided L3-4 lumbar epiduralsteroid injection Palliative right-sided L4-5 transforaminal epiduralsteroid injection Palliative right-sided L2-3 lumbar epiduralsteroid injection Palliative right-sided C7-T1 cervical epiduralsteroid injection Palliativebilateral shoulder joint injection Palliativebilateral suprascapular nerve block    Provider-requested follow-up: No follow-ups on file.  Future Appointments  Date Time Provider [place]  11/01/2018  1:30 PM Vevelyn Francois, NP [facility] None  01/12/2019  9:30 AM [facility] None  01/24/2019 10:45 AM Allyne Gee, MD [place] None   Primary Care Physician: Lenard Simmer, MD Location: [facility] Note by: Vevelyn Francois NP Date: 08/02/2018; Time: 10:53 AM  Pain Score Disclaimer: We use the NRS-11 scale. This is a self-reported, subjective measurement of pain severity with only modest accuracy. It is used primarily to identify  changes within a particular patient. It must be understood that outpatient pain scales are significantly less accurate that those used for research, where they can be applied under ideal controlled circumstances with minimal exposure to variables. In reality, the score is likely to be a combination of pain intensity and pain affect, where pain affect describes the degree of  emotional arousal or changes in action readiness caused by the sensory experience of pain. Factors such as social and work situation, setting, emotional state, anxiety levels, expectation, and prior pain experience may influence pain perception and show large inter-individual differences that may also be affected by time variables.  Patient instructions provided during this appointment: Patient Instructions  ____________________________________________________________________________________________  Medication Rules  Purpose: To inform patients, and their family members, of our rules and regulations.  Applies to: All patients receiving prescriptions (written or electronic).  Pharmacy of record: Pharmacy where electronic prescriptions will be sent. If written prescriptions are taken to a different pharmacy, please inform the nursing staff. The pharmacy listed in the electronic medical record should be the one where you would like electronic prescriptions to be sent.  Electronic prescriptions: In compliance with the [place] (STOP) Act of 2017 (Session Lanny Cramp [phone]), effective August 18, 2018, all controlled substances must be electronically prescribed. Calling prescriptions to the pharmacy will cease to exist.  Prescription refills: Only during scheduled appointments. Applies to all prescriptions.  NOTE: The following applies primarily to controlled substances (Opioid* Pain Medications).   Patient's responsibilities: 1. Pain Pills: Bring all pain pills to every appointment (except  for procedure appointments). 2. Pill Bottles: Bring pills in original pharmacy bottle. Always bring the newest bottle. Bring bottle, even if empty. 3. Medication refills: You are responsible for knowing and keeping track of what medications you take and those you need refilled. The day before your appointment: write a list of all prescriptions that need to be refilled. The day of the appointment: give the list to the admitting nurse. Prescriptions will be written only during appointments. If you forget a medication: it will not be "Called in", "Faxed", or "electronically sent". You will need to get another appointment to get these prescribed. No early refills. Do not call asking to have your prescription filled early. 4. Prescription Accuracy: You are responsible for carefully inspecting your prescriptions before leaving our office. Have the discharge nurse carefully go over each prescription with you, before taking them home. Make sure that your name is accurately spelled, that your address is correct. Check the name and dose of your medication to make sure it is accurate. Check the number of pills, and the written instructions to make sure they are clear and accurate. Make sure that you are given enough medication to last until your next medication refill appointment. 5. Taking Medication: Take medication as prescribed. When it comes to controlled substances, taking less pills or less frequently than prescribed is permitted and encouraged. Never take more pills than instructed. Never take medication more frequently than prescribed.  6. Inform other Doctors: Always inform, all of your healthcare providers, of all the medications you take. 7. Pain Medication from other Providers: You are not allowed to accept any additional pain medication from any other Doctor or Healthcare provider. There are two exceptions to this rule. (see below) In the event that you require additional pain medication, you are  responsible for notifying us, as stated below. 8. Medication Agreement: You are responsible for carefully reading and following our Medication Agreement. This must be signed before receiving any prescriptions from our practice. Safely store a copy of your signed Agreement. Violations to the Agreement will result in no further prescriptions. (Additional copies of our Medication Agreement are available upon request.) 9. Laws, Rules, & Regulations: All patients are expected to follow all Federal and [employer], Rules, [employer]. Ignorance of  the Laws does not constitute a valid excuse. The use of any illegal substances is prohibited. 10. Adopted CDC guidelines & recommendations: Target dosing levels will be at or below 60 MME/day. Use of benzodiazepines** is not recommended.  Exceptions: There are only two exceptions to the rule of not receiving pain medications from other Healthcare Providers. 1. Exception #1 (Emergencies): In the event of an emergency (i.e.: accident requiring emergency care), you are allowed to receive additional pain medication. However, you are responsible for: As soon as you are able, call our office [phone], at any time of the day or night, and leave a message stating your name, the date and nature of the emergency, and the name and dose of the medication prescribed. In the event that your call is answered by a member of our staff, make sure to document and save the date, time, and the name of the person that took your information.  2. Exception #2 (Planned Surgery): In the event that you are scheduled by another doctor or dentist to have any type of surgery or procedure, you are allowed (for a period no longer than 30 days), to receive additional pain medication, for the acute post-op pain. However, in this case, you are responsible for picking up a copy of our "Post-op Pain Management for Surgeons" handout, and giving it to your surgeon or dentist. This document is  available at our office, and does not require an appointment to obtain it. Simply go to our office during business hours (Monday-Thursday from 8:00 AM to 4:00 PM) (Friday 8:00 AM to 12:00 Noon) or if you have a scheduled appointment with [place], prior to your surgery, and ask for it by name. In addition, you will need to provide [place] with your name, name of your surgeon, type of surgery, and date of procedure or surgery.  *Opioid medications include: morphine, codeine, oxycodone, oxymorphone, hydrocodone, hydromorphone, meperidine, tramadol, tapentadol, buprenorphine, fentanyl, methadone. **Benzodiazepine medications include: diazepam (Valium), alprazolam (Xanax), clonazepam (Klonopine), lorazepam (Ativan), clorazepate (Tranxene), chlordiazepoxide (Librium), estazolam (Prosom), oxazepam (Serax), temazepam (Restoril), triazolam (Halcion) (Last updated: 10/15/2017) ____________________________________________________________________________________________

## 2018-08-02 NOTE — Progress Notes (Signed)
Nursing Pain Medication Assessment:  Safety precautions to be maintained throughout the outpatient stay will include: orient to surroundings, keep bed in low position, maintain call bell within reach at all times, provide assistance with transfer out of bed and ambulation.  Medication Inspection Compliance: Regina Campos did not comply with our request to bring her pills to be counted. She was reminded that bringing the medication bottles, even when empty, is a requirement.  Medication: None brought in. Pill/Patch Count: None available to be counted. Bottle Appearance: No container available. Did not bring bottle(s) to appointment. Filled Date: N/A Last Medication intake:

## 2018-08-06 LAB — TOXASSURE SELECT 13 (MW), URINE

## 2018-09-15 ENCOUNTER — Ambulatory Visit (INDEPENDENT_AMBULATORY_CARE_PROVIDER_SITE_OTHER): Payer: Medicare Other

## 2018-09-15 DIAGNOSIS — G4733 Obstructive sleep apnea (adult) (pediatric): Secondary | ICD-10-CM | POA: Diagnosis not present

## 2018-09-15 NOTE — Progress Notes (Signed)
New cpap setup  She was setup on resmed airsense S-10 at 5-15 no humidifier , no supplies.

## 2018-09-21 IMAGING — CR DG FOOT COMPLETE 3+V*L*
1 series · 3 of 3 positions shown · non-contrast
Comparison: None

CLINICAL DATA: Bilateral foot and ankle pain for 6 weeks, no new
injury

EXAM:
LEFT FOOT - COMPLETE 3+ VIEW

[Series 1: dg foot complete left · 0.14mm/px · 3 of 3 slices shown]
[im 1/3]
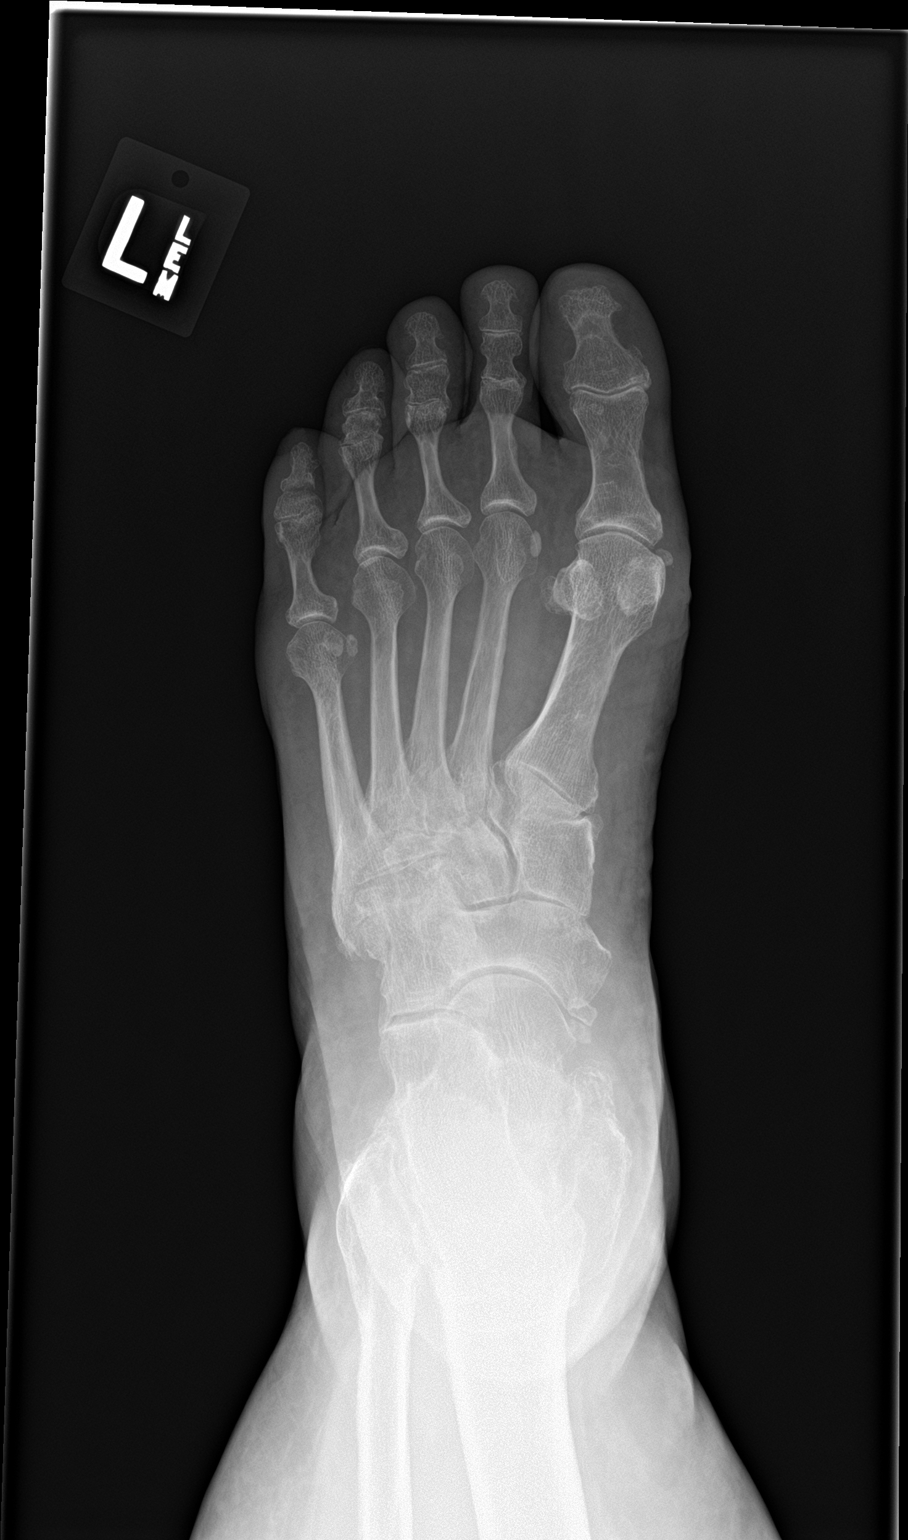
[im 2/3]
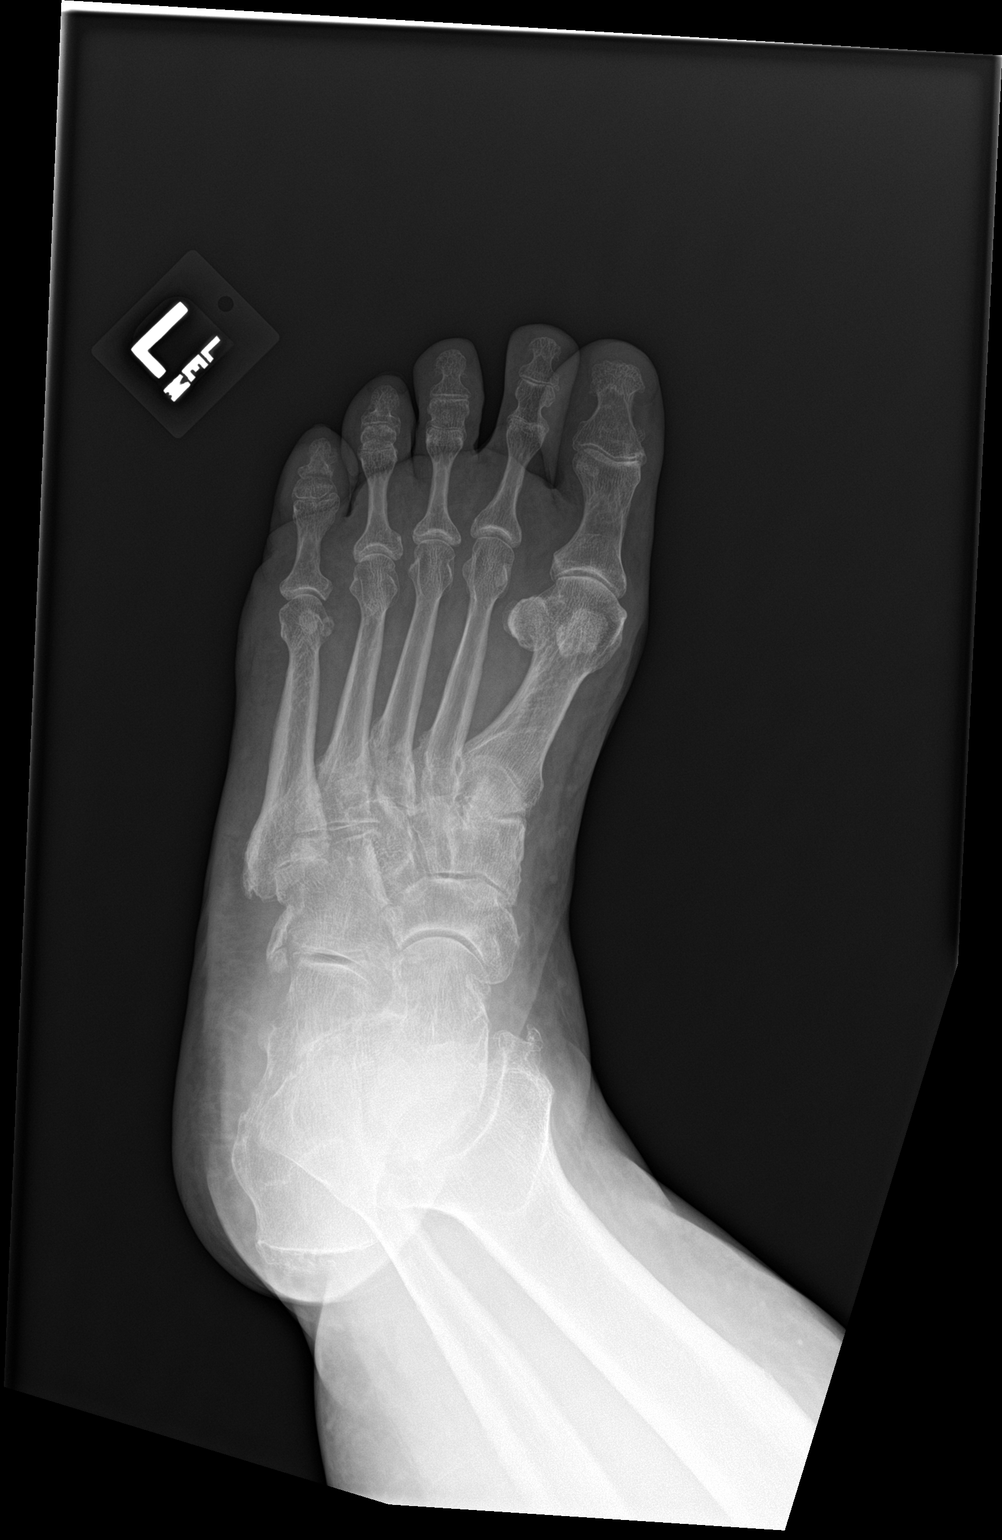
[im 3/3]
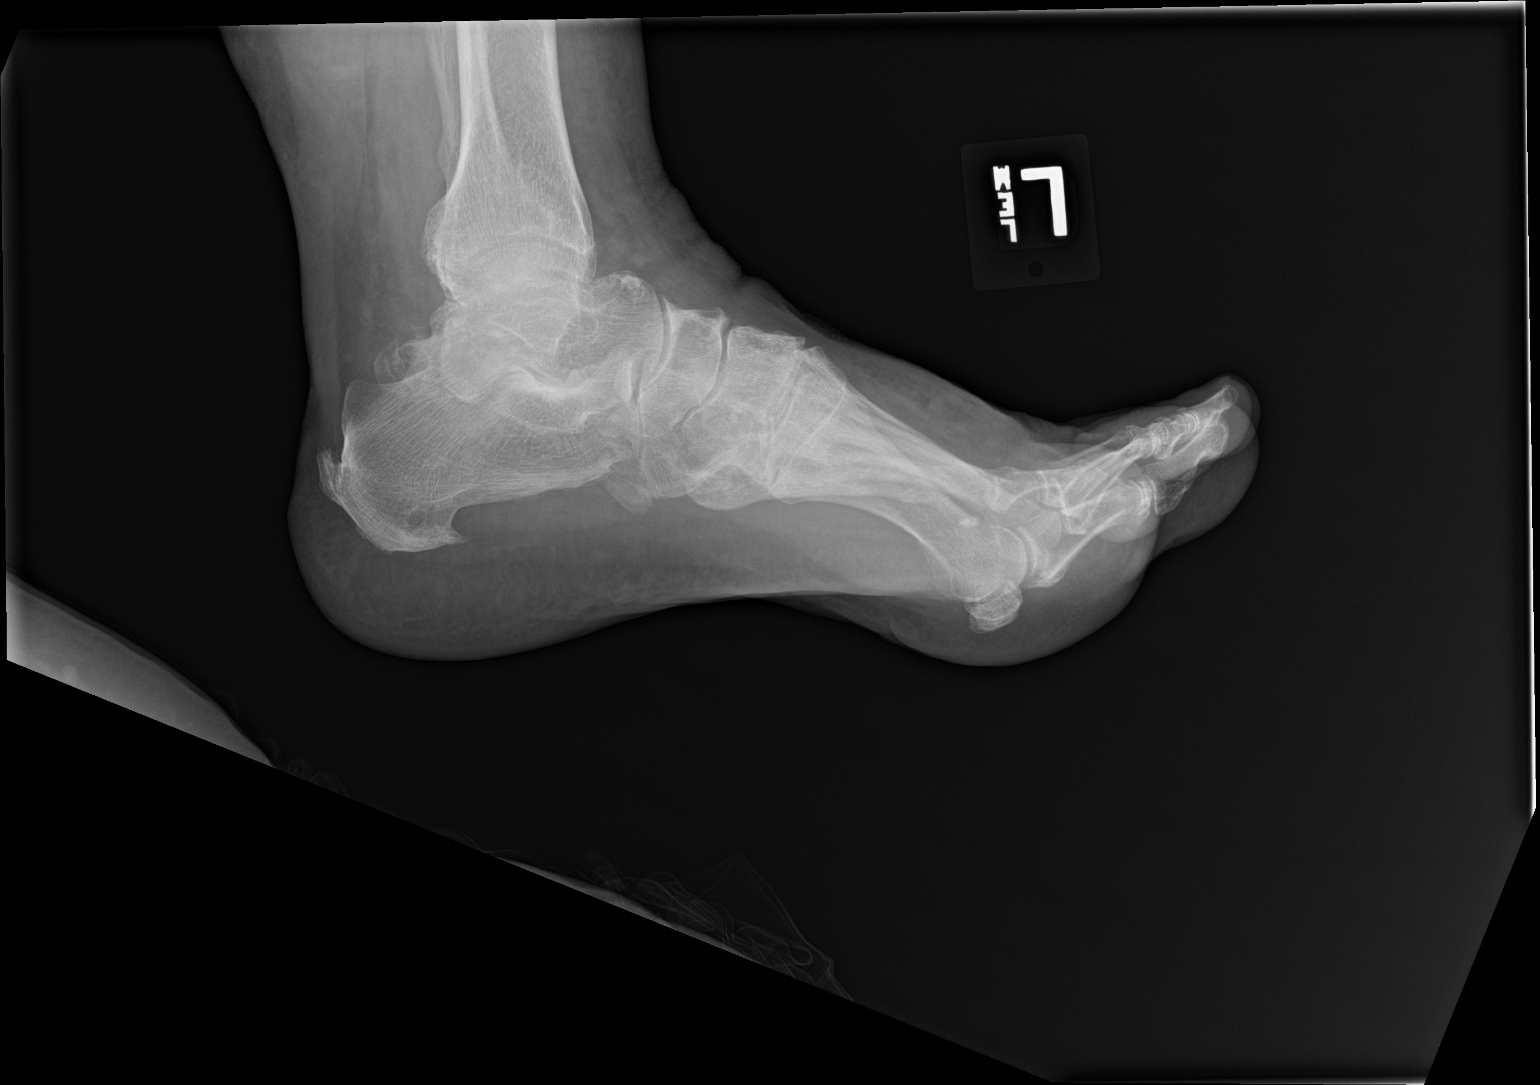

[3 of 3 positions shown; findings below may reference images not displayed]

FINDINGS: There are degenerative changes in the midfoot, hindfoot, and ankle
joint with loss of joint spaces and spurring present. Calcaneal
degenerative spurs are noted. No acute fracture is seen. Mild
degenerative changes noted involving the left first MTP joint. No
erosion is seen.
IMPRESSION: Degenerative change the midfoot, hindfoot, and ankle. No acute
abnormality.

## 2018-09-21 IMAGING — CR DG ANKLE COMPLETE 3+V*R*
1 series · 3 of 3 positions shown · non-contrast
Comparison: None.

CLINICAL DATA: Bilateral foot and ankle pain for 6 weeks, no new
injury

EXAM:
RIGHT ANKLE - COMPLETE 3+ VIEW

[Series 1: dg ankle complete right · 0.14mm/px · 3 of 3 slices shown]
[im 1/3]
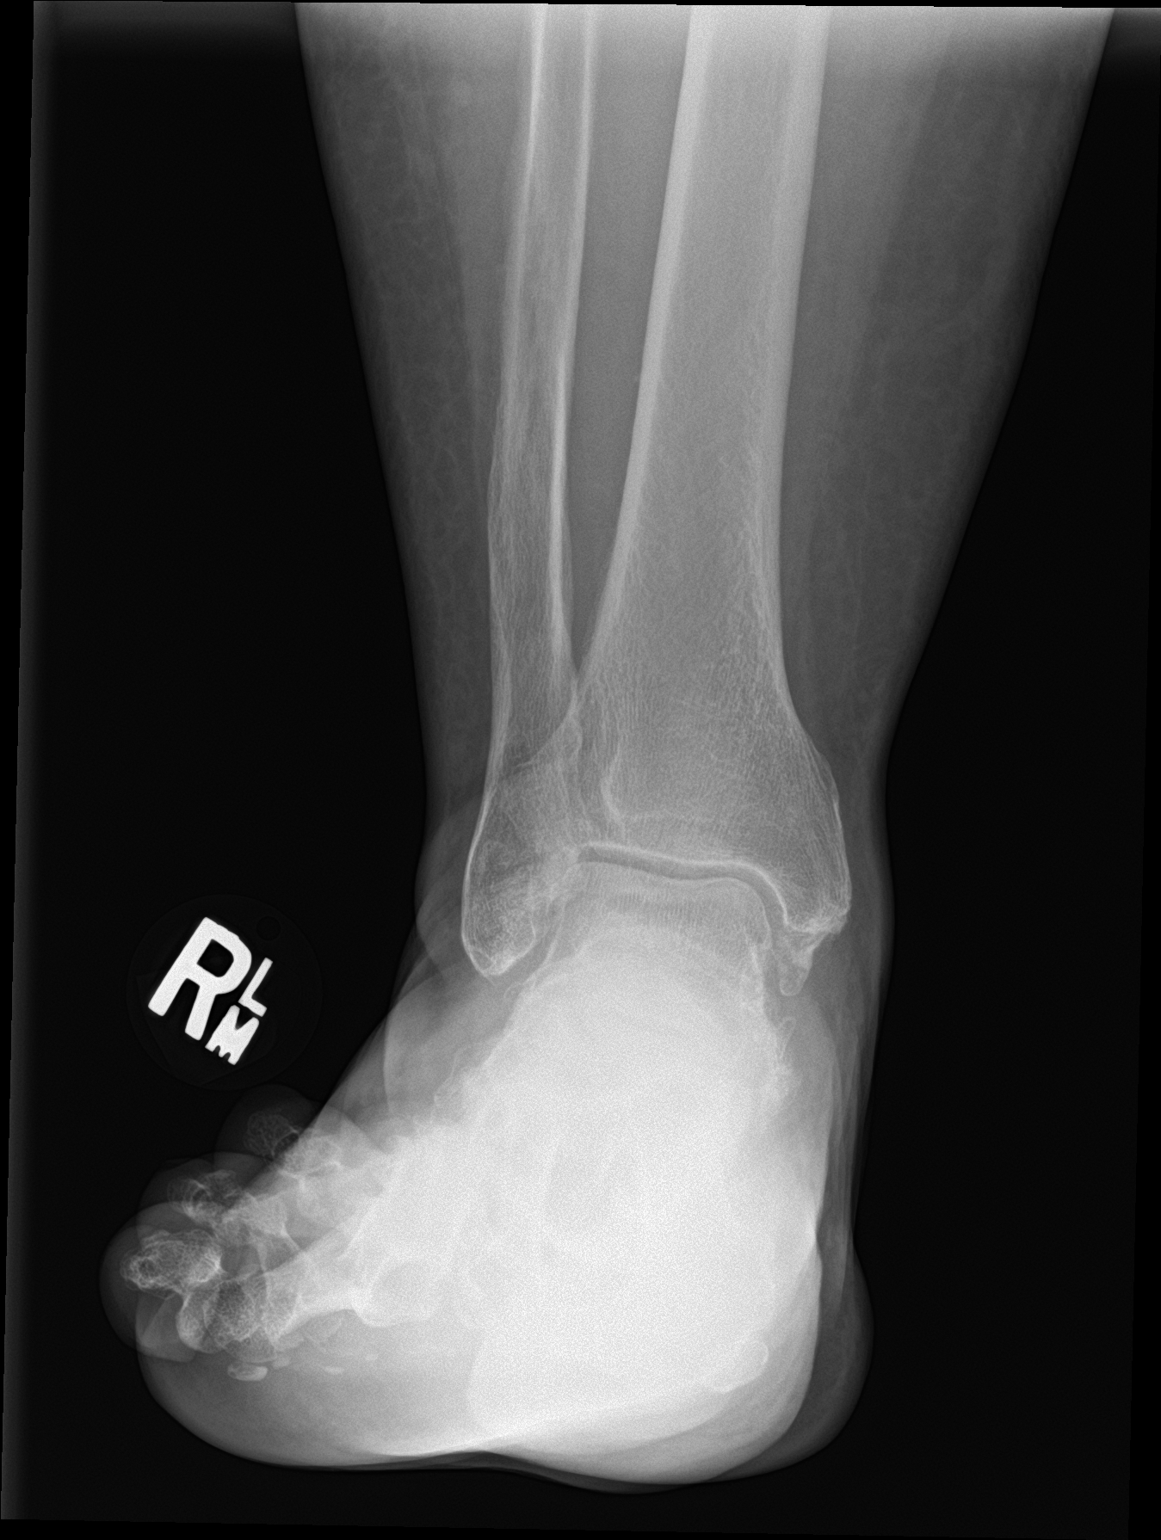
[im 2/3]
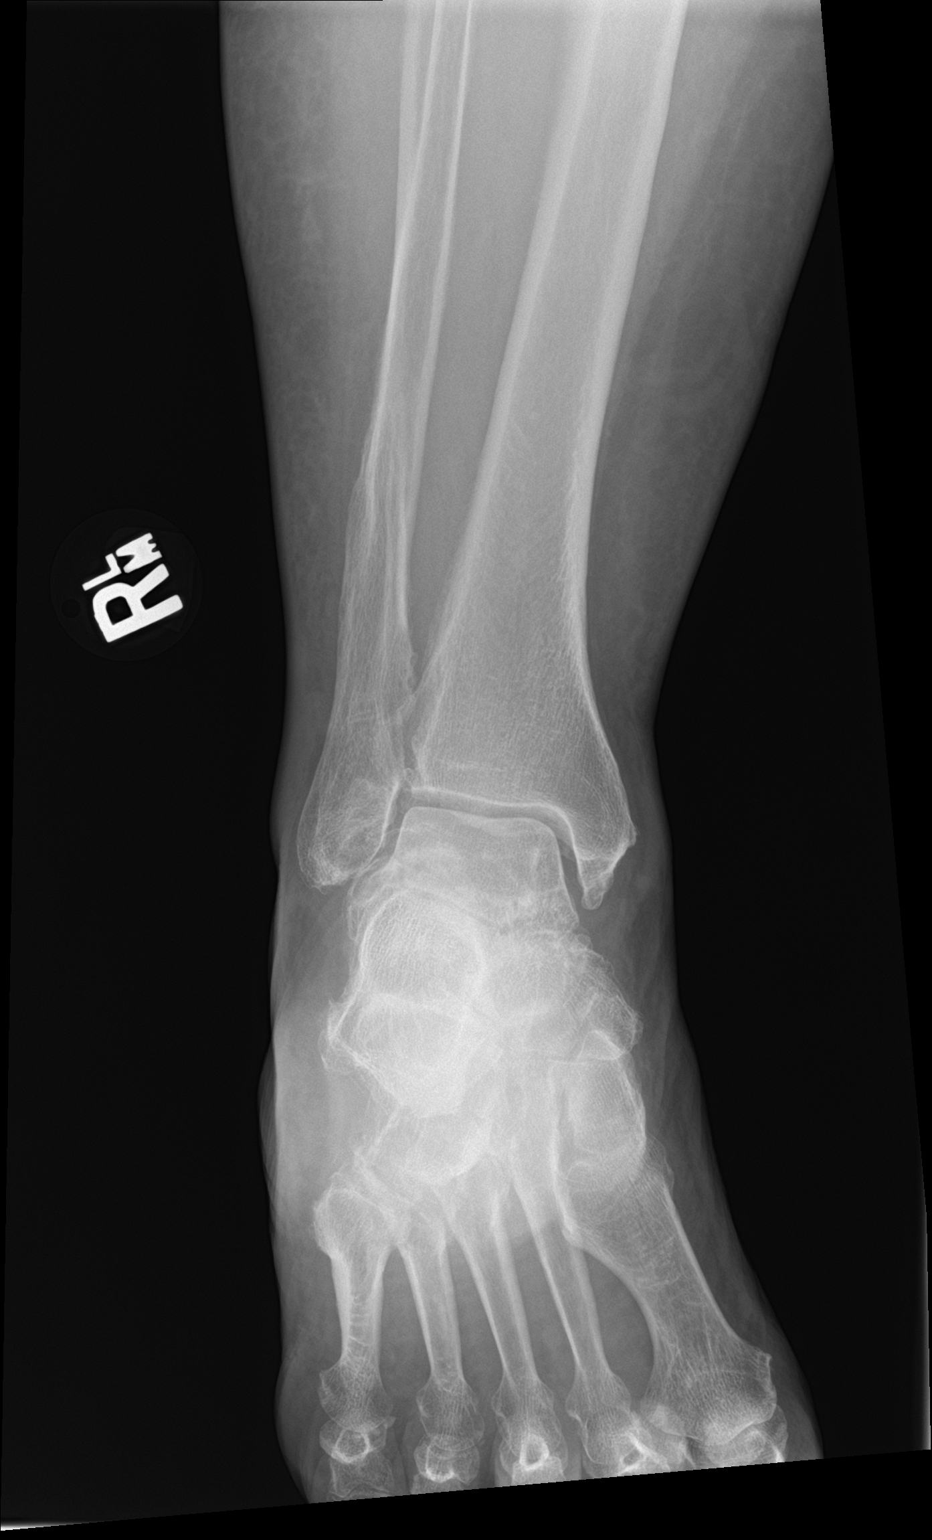
[im 3/3]
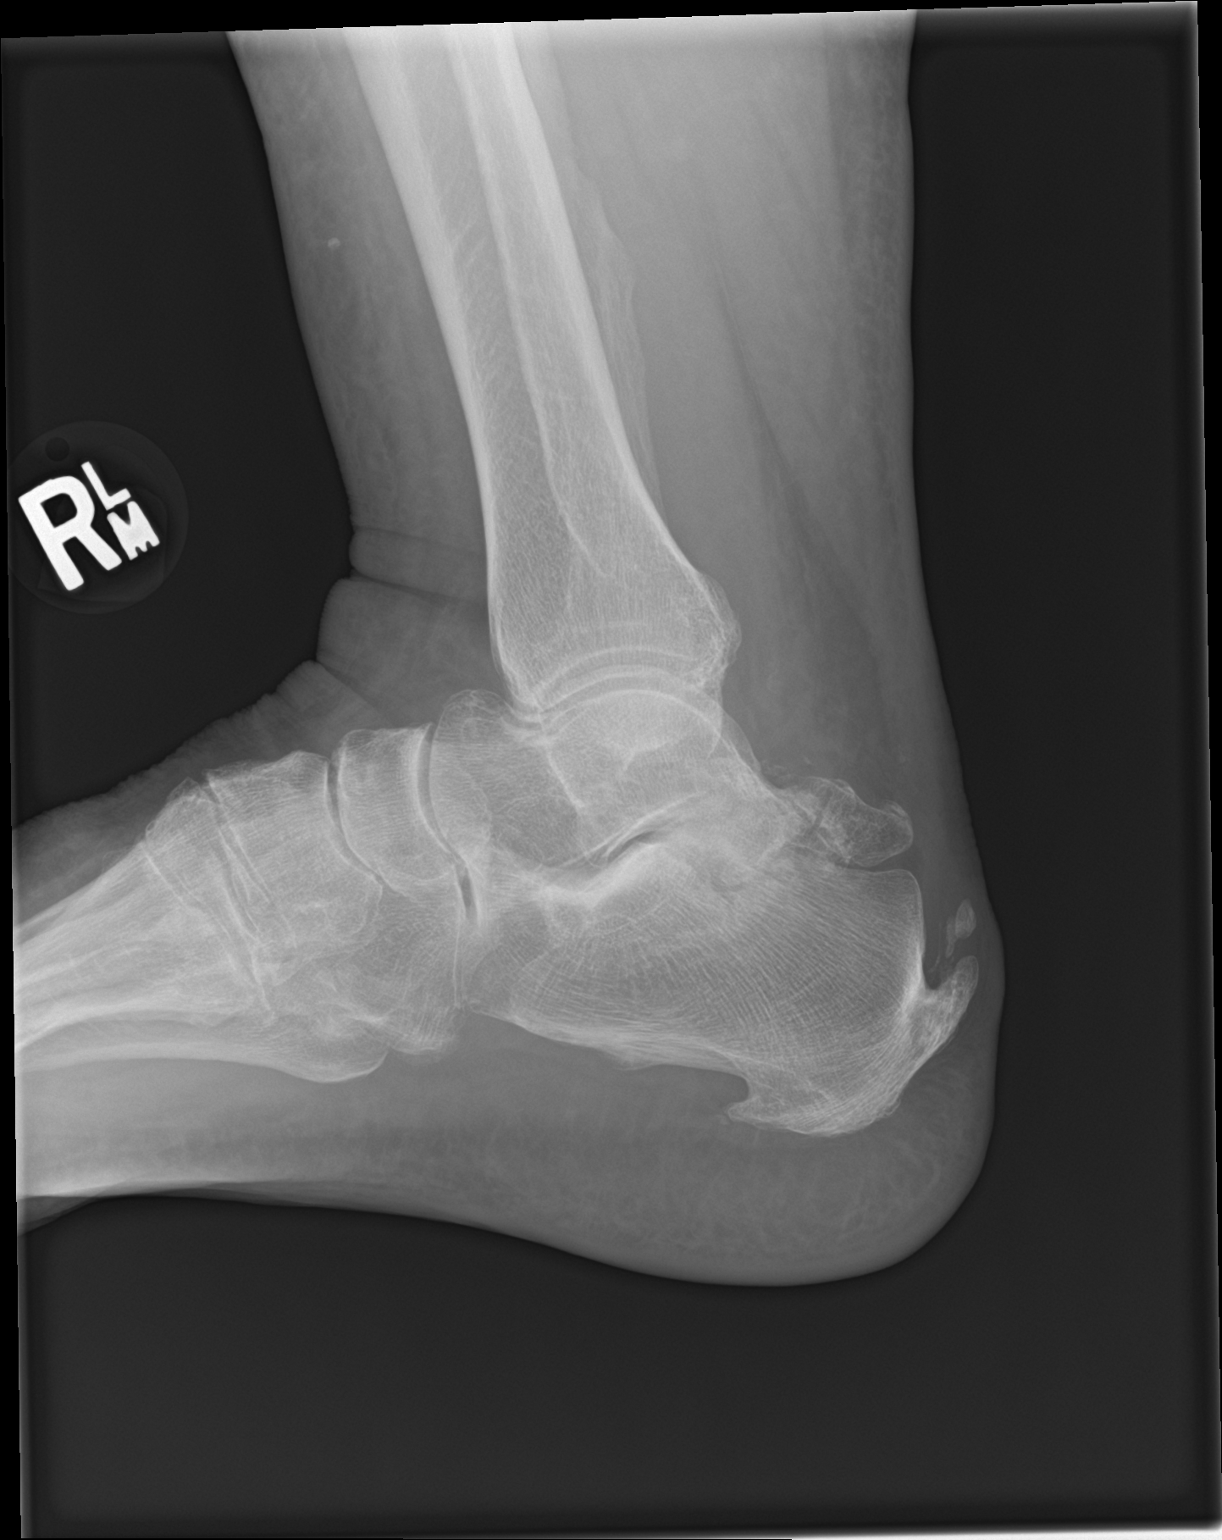

[3 of 3 positions shown; findings below may reference images not displayed]

FINDINGS: There are degenerative changes in the hindfoot and ankle joint with
some loss of joint space and spurring present. Calcaneal
degenerative spurs are noted. The ankle joint is relatively normally
aligned with no acute abnormality. Probable old healed fracture of
the distal right fibula is noted.
IMPRESSION: Degenerative change in the ankle joint and hindfoot. No acute
abnormality.

## 2018-09-29 IMAGING — CT CT ABD-PELV W/ CM
2 of 5 series · 16 of 46 positions shown, 18 images · IV contrast (iopamidol)
Comparison: None.

CLINICAL DATA: Left upper quadrant abdominal pain. Lower abdominal
pain bilaterally. Weight loss. Left upper quadrant mass.

EXAM:
CT ABDOMEN AND PELVIS WITH CONTRAST
TECHNIQUE: Multidetector CT imaging of the abdomen and pelvis was performed
using the standard protocol following bolus administration of
intravenous contrast.
CONTRAST:  100mL SXFX60-111 IOPAMIDOL (SXFX60-111) INJECTION 61%

[Series 2: abd pelvis (person_name) · axial · 0.83mm/px · z∈[-1546,-1121]mm · 13 of 97 slices shown, 15 images (1 of 2)]
[im 6/97  soft-tissue]
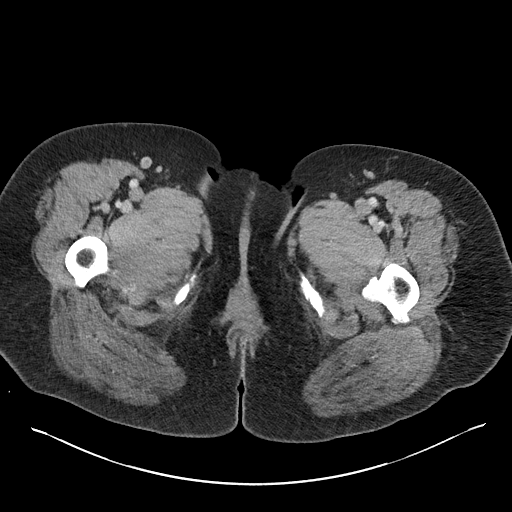
[im 6/97  bone]
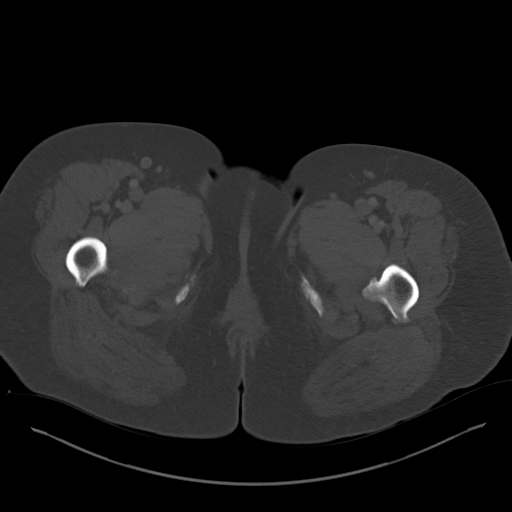
[im 16/97  soft-tissue]
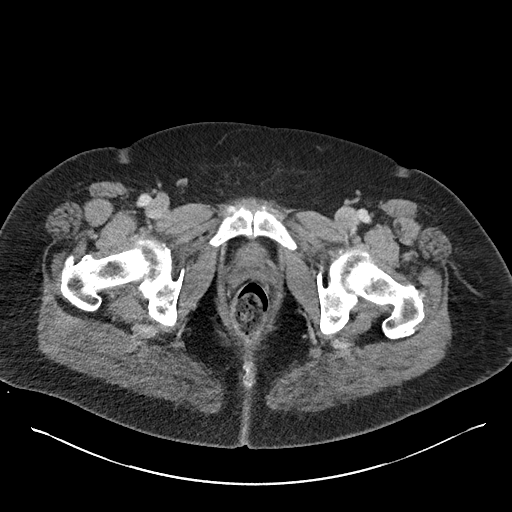
[im 21/97  soft-tissue]
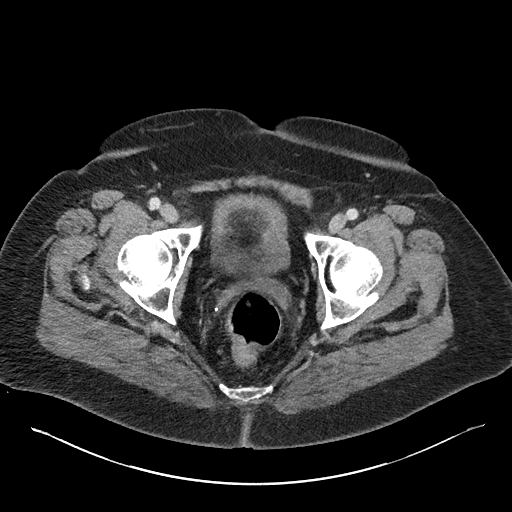
[im 26/97  soft-tissue]
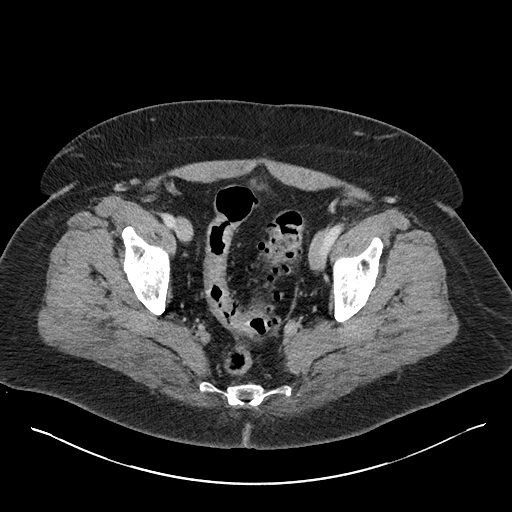
[im 36/97  soft-tissue]
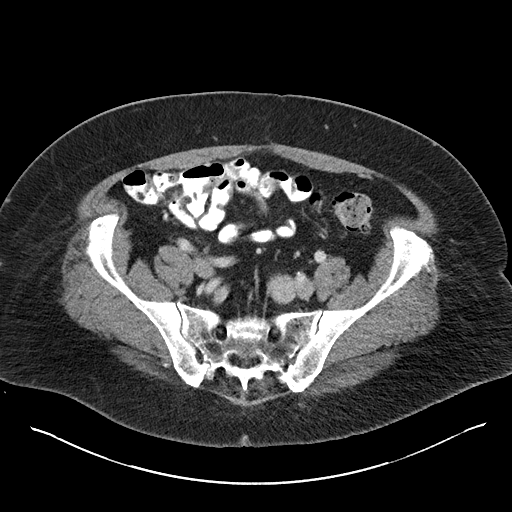
[im 41/97  soft-tissue]
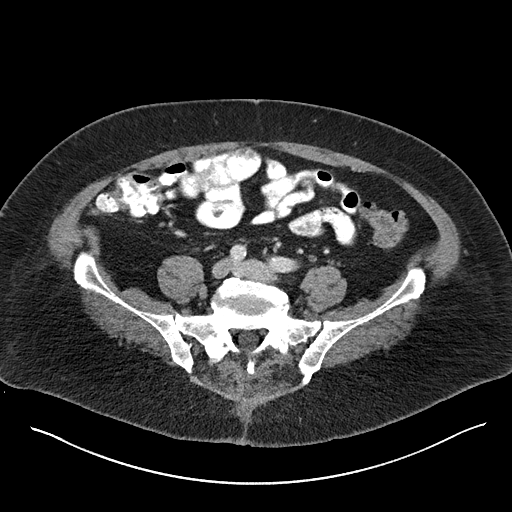
[im 51/97  soft-tissue]
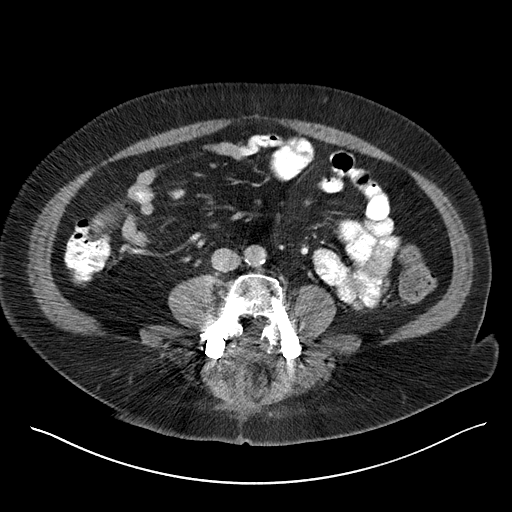
[im 56/97  soft-tissue]
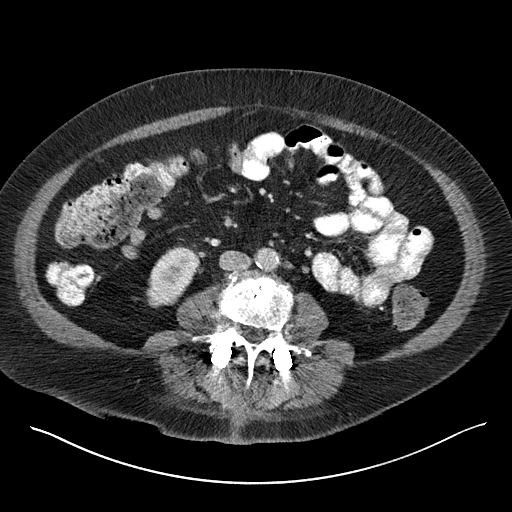
[im 61/97  soft-tissue]
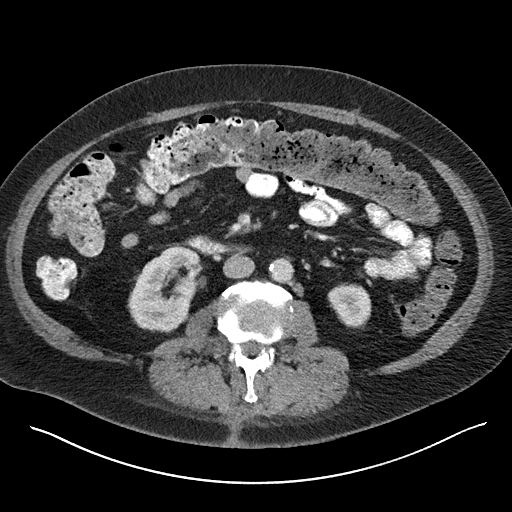
[im 61/97  bone]
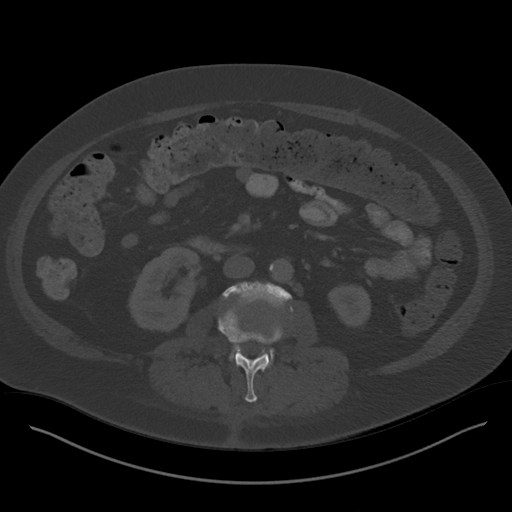
[im 71/97  soft-tissue]
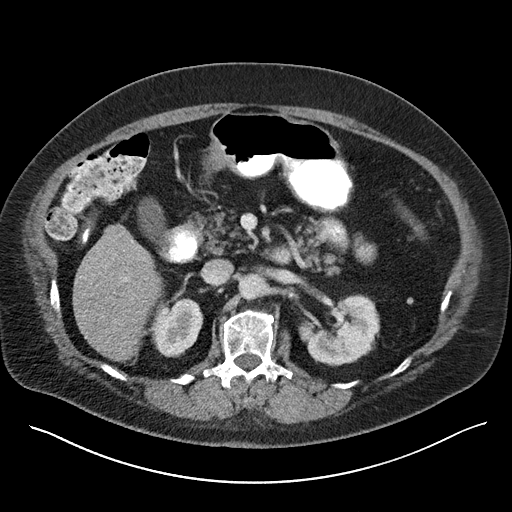
[im 76/97  soft-tissue]
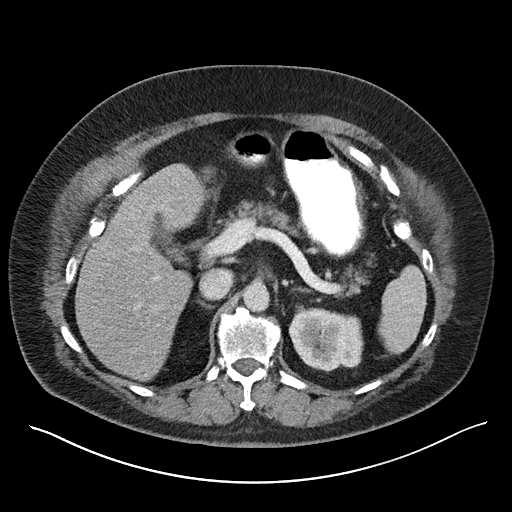
[im 81/97  soft-tissue]
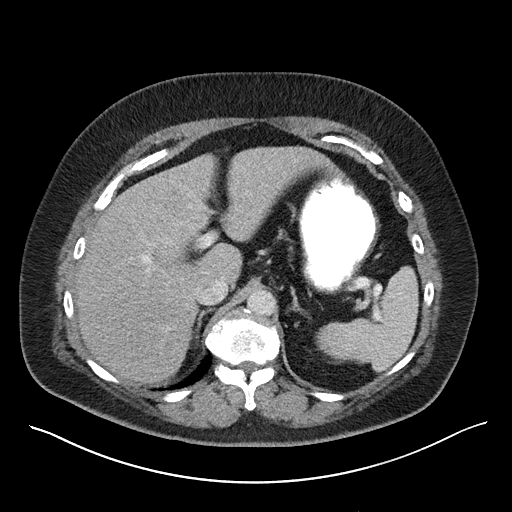
[im 91/97  soft-tissue]
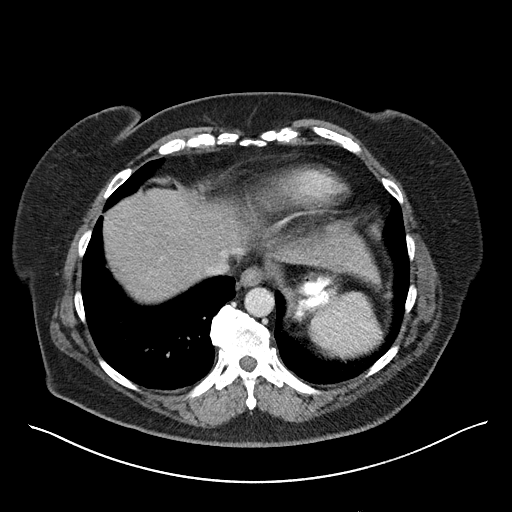

[Series 4: abd pelvis (person_name) · coronal · 0.83mm/px · 3 of 165 slices shown (2 of 2)]
[im 55/165  soft-tissue]
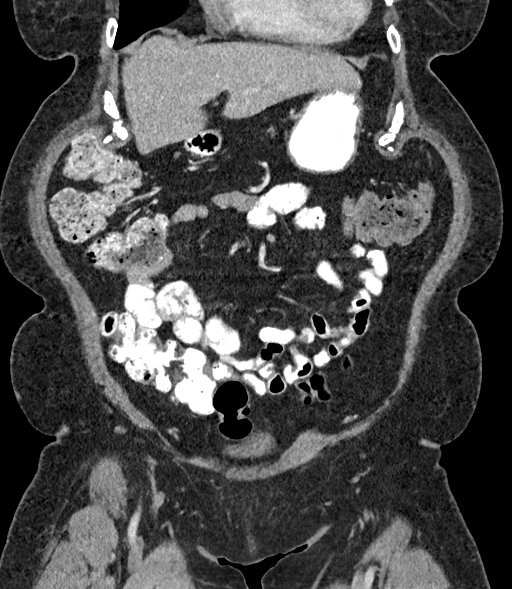
[im 73/165  soft-tissue]
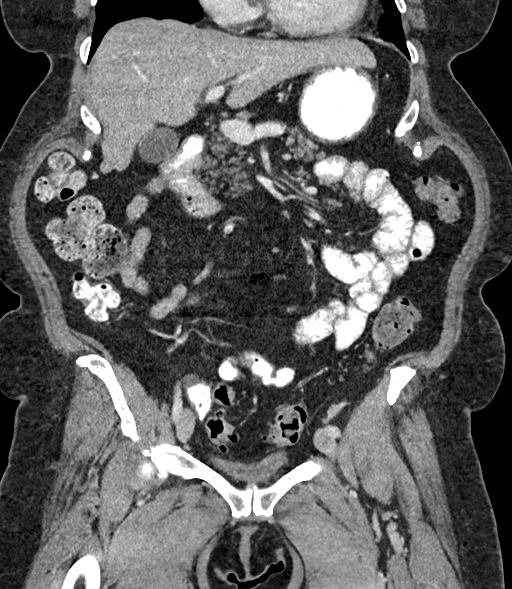
[im 92/165  soft-tissue]
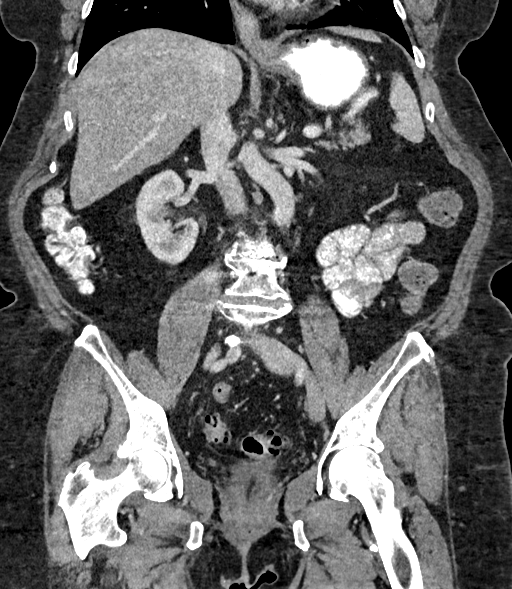

[16 of 46 positions shown; findings below may reference images not displayed]

FINDINGS: Lower chest: Left base scarring. Borderline cardiomegaly, without
pericardial or pleural effusion.

Hepatobiliary: Normal liver. Normal gallbladder, without biliary
ductal dilatation.

Pancreas: Normal, without mass or ductal dilatation.

Spleen: Normal in size, without focal abnormality.

Adrenals/Urinary Tract: Normal adrenal glands. Interpolar left renal
too small to characterize lesion. Normal right kidney Normal urinary
bladder.

Stomach/Bowel: Gastric antral underdistention. Colonic stool burden
suggests constipation. Extensive colonic diverticulosis. Normal
terminal ileum. Normal small bowel.

Vascular/Lymphatic: Aortic and branch vessel atherosclerosis. No
abdominopelvic adenopathy.

Reproductive: Hysterectomy.  No adnexal mass.

Other: No significant free fluid.

Musculoskeletal: L3-5 trans pedicle screw fixation.
IMPRESSION: 1.  No acute process in the abdomen or pelvis.
2.  Possible constipation.
3.  Aortic Atherosclerosis ([id number]-916.6).

## 2018-10-20 ENCOUNTER — Ambulatory Visit (INDEPENDENT_AMBULATORY_CARE_PROVIDER_SITE_OTHER): Payer: Medicare Other

## 2018-10-20 DIAGNOSIS — G4733 Obstructive sleep apnea (adult) (pediatric): Secondary | ICD-10-CM | POA: Diagnosis not present

## 2018-10-20 NOTE — Progress Notes (Signed)
95 percentile pressure 11.9   95th percentile leak 10.9   apnea index 2.2 /hr  apnea-hypopnea index  2.3 /hr   total days used  >4 hr 24 days  total days used <4 hr 1 days  Total compliance 80 percent  Checked cpap the power cord wasn't pushed all way in. No other problems or questions

## 2018-11-01 ENCOUNTER — Ambulatory Visit: Payer: Medicare Other | Attending: Nurse Practitioner | Admitting: Nurse Practitioner

## 2018-11-01 ENCOUNTER — Other Ambulatory Visit: Payer: Self-pay

## 2018-11-01 ENCOUNTER — Encounter: Payer: Self-pay | Admitting: Nurse Practitioner

## 2018-11-01 VITALS — BP 122/87 | HR 58 | Temp 97.6°F | Ht 64.0 in | Wt 213.0 lb

## 2018-11-01 DIAGNOSIS — R0781 Pleurodynia: Secondary | ICD-10-CM | POA: Diagnosis present

## 2018-11-01 DIAGNOSIS — M4726 Other spondylosis with radiculopathy, lumbar region: Secondary | ICD-10-CM | POA: Diagnosis present

## 2018-11-01 DIAGNOSIS — M4722 Other spondylosis with radiculopathy, cervical region: Secondary | ICD-10-CM | POA: Insufficient documentation

## 2018-11-01 DIAGNOSIS — G894 Chronic pain syndrome: Secondary | ICD-10-CM | POA: Insufficient documentation

## 2018-11-01 DIAGNOSIS — M797 Fibromyalgia: Secondary | ICD-10-CM | POA: Diagnosis present

## 2018-11-01 MED ORDER — TRAMADOL HCL 50 MG PO TABS: 100.0000 mg | ORAL_TABLET | Freq: Four times a day (QID) | ORAL | 2 refills | Status: DC | PRN

## 2018-11-01 NOTE — Patient Instructions (Signed)
____________________________________________________________________________________________  Medication Rules  Purpose: To inform patients, and their family members, of our rules and regulations.  Applies to: All patients receiving prescriptions (written or electronic).  Pharmacy of record: Pharmacy where electronic prescriptions will be sent. If written prescriptions are taken to a different pharmacy, please inform the nursing staff. The pharmacy listed in the electronic medical record should be the one where you would like electronic prescriptions to be sent.  Electronic prescriptions: In compliance with the [place] Strengthen Opioid Misuse Prevention (STOP) Act of 2017 (Session Law 2017-74/H243), effective August 18, 2018, all controlled substances must be electronically prescribed. Calling prescriptions to the pharmacy will cease to exist.  Prescription refills: Only during scheduled appointments. Applies to all prescriptions.  NOTE: The following applies primarily to controlled substances (Opioid* Pain Medications).   Patient's responsibilities: 1. Pain Pills: Bring all pain pills to every appointment (except for procedure appointments). 2. Pill Bottles: Bring pills in original pharmacy bottle. Always bring the newest bottle. Bring bottle, even if empty. 3. Medication refills: You are responsible for knowing and keeping track of what medications you take and those you need refilled. The day before your appointment: write a list of all prescriptions that need to be refilled. The day of the appointment: give the list to the admitting nurse. Prescriptions will be written only during appointments. No prescriptions will be written on procedure days. If you forget a medication: it will not be "Called in", "Faxed", or "electronically sent". You will need to get another appointment to get these prescribed. No early refills. Do not call asking to have your prescription filled  early. 4. Prescription Accuracy: You are responsible for carefully inspecting your prescriptions before leaving our office. Have the discharge nurse carefully go over each prescription with you, before taking them home. Make sure that your name is accurately spelled, that your address is correct. Check the name and dose of your medication to make sure it is accurate. Check the number of pills, and the written instructions to make sure they are clear and accurate. Make sure that you are given enough medication to last until your next medication refill appointment. 5. Taking Medication: Take medication as prescribed. When it comes to controlled substances, taking less pills or less frequently than prescribed is permitted and encouraged. Never take more pills than instructed. Never take medication more frequently than prescribed.  6. Inform other Doctors: Always inform, all of your healthcare providers, of all the medications you take. 7. Pain Medication from other Providers: You are not allowed to accept any additional pain medication from any other Doctor or Healthcare provider. There are two exceptions to this rule. (see below) In the event that you require additional pain medication, you are responsible for notifying us, as stated below. 8. Medication Agreement: You are responsible for carefully reading and following our Medication Agreement. This must be signed before receiving any prescriptions from our practice. Safely store a copy of your signed Agreement. Violations to the Agreement will result in no further prescriptions. (Additional copies of our Medication Agreement are available upon request.) 9. Laws, Rules, & Regulations: All patients are expected to follow all Federal and State Laws, Statutes, Rules, & Regulations. Ignorance of the Laws does not constitute a valid excuse. The use of any illegal substances is prohibited. 10. Adopted CDC guidelines & recommendations: Target dosing levels will be  at or below 60 MME/day. Use of benzodiazepines** is not recommended.  Exceptions: There are only two exceptions to the rule of not   receiving pain medications from other Healthcare Providers. 1. Exception #1 (Emergencies): In the event of an emergency (i.e.: accident requiring emergency care), you are allowed to receive additional pain medication. However, you are responsible for: As soon as you are able, call our office [phone], at any time of the day or night, and leave a message stating your name, the date and nature of the emergency, and the name and dose of the medication prescribed. In the event that your call is answered by a member of our staff, make sure to document and save the date, time, and the name of the person that took your information.  2. Exception #2 (Planned Surgery): In the event that you are scheduled by another doctor or dentist to have any type of surgery or procedure, you are allowed (for a period no longer than 30 days), to receive additional pain medication, for the acute post-op pain. However, in this case, you are responsible for picking up a copy of our "Post-op Pain Management for Surgeons" handout, and giving it to your surgeon or dentist. This document is available at our office, and does not require an appointment to obtain it. Simply go to our office during business hours (Monday-Thursday from 8:00 AM to 4:00 PM) (Friday 8:00 AM to 12:00 Noon) or if you have a scheduled appointment with us, prior to your surgery, and ask for it by name. In addition, you will need to provide us with your name, name of your surgeon, type of surgery, and date of procedure or surgery.  *Opioid medications include: morphine, codeine, oxycodone, oxymorphone, hydrocodone, hydromorphone, meperidine, tramadol, tapentadol, buprenorphine, fentanyl, methadone. **Benzodiazepine medications include: diazepam (Valium), alprazolam (Xanax), clonazepam (Klonopine), lorazepam (Ativan), clorazepate  (Tranxene), chlordiazepoxide (Librium), estazolam (Prosom), oxazepam (Serax), temazepam (Restoril), triazolam (Halcion) (Last updated: 10/15/2017) ____________________________________________________________________________________________    

## 2018-11-01 NOTE — Progress Notes (Signed)
Nursing Pain Medication Assessment:  Safety precautions to be maintained throughout the outpatient stay will include: orient to surroundings, keep bed in low position, maintain call bell within reach at all times, provide assistance with transfer out of bed and ambulation.  Medication Inspection Compliance: Pill count conducted under aseptic conditions, in front of the patient. Neither the pills nor the bottle was removed from the patient's sight at any time. Once count was completed pills were immediately returned to the patient in their original bottle.  Medication: Tramadol (Ultram) Pill/Patch Count: 46 of 240 pills remain Pill/Patch Appearance: Markings consistent with prescribed medication Bottle Appearance: Standard pharmacy container. Clearly labeled. Filled Date: 2 / 17 / 2020 Last Medication intake:  Today
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
## 2019-01-12 ENCOUNTER — Ambulatory Visit: Payer: Self-pay

## 2019-01-24 ENCOUNTER — Other Ambulatory Visit: Payer: Self-pay

## 2019-01-24 ENCOUNTER — Encounter: Payer: Self-pay | Admitting: Internal Medicine

## 2019-01-24 ENCOUNTER — Ambulatory Visit (INDEPENDENT_AMBULATORY_CARE_PROVIDER_SITE_OTHER): Payer: Medicare Other | Admitting: Internal Medicine

## 2019-01-24 VITALS — Ht 65.0 in | Wt 218.0 lb

## 2019-01-24 DIAGNOSIS — G4733 Obstructive sleep apnea (adult) (pediatric): Secondary | ICD-10-CM | POA: Diagnosis not present

## 2019-01-24 DIAGNOSIS — Z9989 Dependence on other enabling machines and devices: Secondary | ICD-10-CM | POA: Diagnosis not present

## 2019-01-24 NOTE — Progress Notes (Signed)
[facility], [place] [address]  Internal MEDICINE  Telephone Visit  Patient Name: Regina Campos  [id number]  [id number]  Date of Service: 01/24/2019  I connected with the patient at  1100 by telephone and verified the patients identity using two identifiers.   I discussed the limitations, risks, security and privacy concerns of performing an evaluation and management service by telephone and the availability of in person appointments. I also discussed with the patient that there may be a patient responsible charge related to the service.  The patient expressed understanding and agrees to proceed.    Chief Complaint  Patient presents with  . Telephone Assessment  . Telephone Screen  . Follow-up    HPI PT seen via video for pulmonary follow up.  She has a history of osa on cpap. She reports using it nightly with good results.  She cleans her machine regularly. She is changing her tubing and mask seal as directed.  She denies any current issue at this time.  She does continue to have sinus issues, but denies fever or pain at this time. Denies Chest pain, Shortness of breath, palpitations, headache, or blurred vision.      Current Medication: Outpatient Encounter Medications as of 01/24/2019  Medication Sig  . albuterol (PROVENTIL HFA;VENTOLIN HFA) 108 (90 Base) MCG/ACT inhaler Inhale into the lungs.  . budesonide-formoterol (SYMBICORT) 160-4.5 MCG/ACT inhaler Inhale 2 puffs into the lungs 2 (two) times daily.  . ergocalciferol (VITAMIN D2) 50000 units capsule Take 50,000 Units by mouth once a week.  . levothyroxine (SYNTHROID, LEVOTHROID) 125 MCG tablet Take 125 mcg by mouth daily before breakfast.  . montelukast (SINGULAIR) 10 MG tablet Take by mouth.  . nitroGLYCERIN (NITROSTAT) 0.4 MG SL tablet TAKE ONE TABLET SUBLINGUALLY EVERY 5 MINUTES X 3 FOR CHEST PAIN  . Omega-3 Fatty Acids (FISH OIL) 1000 MG CAPS Take by mouth.  . tiotropium (SPIRIVA) 18 MCG  inhalation capsule Place into inhaler and inhale.  . traMADol (ULTRAM) 50 MG tablet Take 2 tablets (100 mg total) by mouth every 6 (six) hours as needed for severe pain.   No facility-administered encounter medications on file as of 01/24/2019.     Surgical History: Past Surgical History:  Procedure Laterality Date  . ABDOMINAL HYSTERECTOMY    . APPENDECTOMY    . BACK SURGERY  07/25/2016  . CARPAL TUNNEL RELEASE    . LUMBAR FUSION    . REPLACEMENT TOTAL KNEE     right and left  . TOTAL SHOULDER REPLACEMENT      Medical History: Past Medical History:  Diagnosis Date  . Arthritis   . Displacement of lumbar intervertebral disc 06/04/2015  . History of cardiac arrhythmia 06/04/2015  . History of cervical spinal surgery 06/04/2015  . Hyperlipidemia   . Hypothyroidism 06/04/2015  . Thyroid disease     Family History: Family History  Problem Relation Age of Onset  . Cancer Mother   . Heart disease Father     Social History   Socioeconomic History  . Marital status: Widowed    Spouse name: Not on file  . Number of children: Not on file  . Years of education: Not on file  . Highest education level: Not on file  Occupational History  . Not on file  Social Needs  . Financial resource strain: Not on file  . Food insecurity:    Worry: Not on file    Inability: Not on file  . Transportation  needs:    Medical: Not on file    Non-medical: Not on file  Tobacco Use  . Smoking status: Never Smoker  . Smokeless tobacco: Never Used  Substance and Sexual Activity  . Alcohol use: No    Alcohol/week: 0.0 standard drinks  . Drug use: No  . Sexual activity: Not on file  Lifestyle  . Physical activity:    Days per week: Not on file    Minutes per session: Not on file  . Stress: Not on file  Relationships  . Social connections:    Talks on phone: Not on file    Gets together: Not on file    Attends religious service: Not on file    Active member of club or organization:  Not on file    Attends meetings of clubs or organizations: Not on file    Relationship status: Not on file  . Intimate partner violence:    Fear of current or ex partner: Not on file    Emotionally abused: Not on file    Physically abused: Not on file    Forced sexual activity: Not on file  Other Topics Concern  . Not on file  Social History Narrative  . Not on file      Review of Systems  Constitutional: Negative for chills, fatigue and unexpected weight change.  HENT: Negative for congestion, rhinorrhea, sneezing and sore throat.   Eyes: Negative for photophobia, pain and redness.  Respiratory: Negative for cough, chest tightness and shortness of breath.   Cardiovascular: Negative for chest pain and palpitations.  Gastrointestinal: Negative for abdominal pain, constipation, diarrhea, nausea and vomiting.  Endocrine: Negative.   Genitourinary: Negative for dysuria and frequency.  Musculoskeletal: Negative for arthralgias, back pain, joint swelling and neck pain.  Skin: Negative for rash.  Allergic/Immunologic: Negative.   Neurological: Negative for tremors and numbness.  Hematological: Negative for adenopathy. Does not bruise/bleed easily.  Psychiatric/Behavioral: Negative for behavioral problems and sleep disturbance. The patient is not nervous/anxious.     Vital Signs: Ht 5\' 5"  (1.651 m)   Wt 218 lb (98.9 kg)   BMI 36.28 kg/m    Observation/Objective:  Speaking in full sentences, well appearing. NAD noted.    Assessment/Plan: 1. OSA on CPAP Stable, continue to use cpap as discussed.  Continue to encourage patient to use CPAP nightly.  Excellent relief of symptoms.       2. Morbid obesity ([place]) Obesity Counseling: Risk Assessment: An assessment of behavioral risk factors was made today and includes lack of exercise sedentary lifestyle, lack of portion control and poor dietary habits.  Risk Modification Advice: She was counseled on portion control guidelines.  Restricting daily caloric intake to. . The detrimental long term effects of obesity on her health and ongoing poor compliance was also discussed with the patient.   General Counseling: Regina Campos verbalizes understanding of the findings of today's phone visit and agrees with plan of treatment. I have discussed any further diagnostic evaluation that may be needed or ordered today. We also reviewed her medications today. she has been encouraged to call the office with any questions or concerns that should arise related to todays visit.    No orders of the defined types were placed in this encounter.   No orders of the defined types were placed in this encounter.   Time spent: 12 Minutes   Orson Gear AGNP-C Pulmonary medicine

## 2019-01-24 NOTE — Patient Instructions (Signed)

## 2019-01-27 ENCOUNTER — Telehealth: Payer: Self-pay

## 2019-01-27 NOTE — Telephone Encounter (Signed)
Attempted to  Call patient and do pre virtual visit questions.  LM tocall office.

## 2019-01-31 ENCOUNTER — Ambulatory Visit: Payer: Medicare Other | Attending: Nurse Practitioner | Admitting: Pain Medicine

## 2019-01-31 ENCOUNTER — Other Ambulatory Visit: Payer: Self-pay

## 2019-01-31 ENCOUNTER — Encounter: Admitting: Nurse Practitioner

## 2019-01-31 DIAGNOSIS — G8929 Other chronic pain: Secondary | ICD-10-CM

## 2019-01-31 DIAGNOSIS — M7918 Myalgia, other site: Secondary | ICD-10-CM

## 2019-01-31 DIAGNOSIS — M899 Disorder of bone, unspecified: Secondary | ICD-10-CM

## 2019-01-31 DIAGNOSIS — M542 Cervicalgia: Secondary | ICD-10-CM | POA: Diagnosis not present

## 2019-01-31 DIAGNOSIS — M25512 Pain in left shoulder: Secondary | ICD-10-CM

## 2019-01-31 DIAGNOSIS — M797 Fibromyalgia: Secondary | ICD-10-CM

## 2019-01-31 DIAGNOSIS — Z79899 Other long term (current) drug therapy: Secondary | ICD-10-CM

## 2019-01-31 DIAGNOSIS — Z789 Other specified health status: Secondary | ICD-10-CM

## 2019-01-31 DIAGNOSIS — M19012 Primary osteoarthritis, left shoulder: Secondary | ICD-10-CM

## 2019-01-31 DIAGNOSIS — G894 Chronic pain syndrome: Secondary | ICD-10-CM | POA: Diagnosis not present

## 2019-01-31 DIAGNOSIS — M5442 Lumbago with sciatica, left side: Secondary | ICD-10-CM | POA: Diagnosis not present

## 2019-01-31 DIAGNOSIS — M5441 Lumbago with sciatica, right side: Secondary | ICD-10-CM

## 2019-01-31 DIAGNOSIS — M25511 Pain in right shoulder: Secondary | ICD-10-CM

## 2019-01-31 MED ORDER — TRAMADOL HCL 50 MG PO TABS: 100.0000 mg | ORAL_TABLET | Freq: Four times a day (QID) | ORAL | 2 refills | Status: DC | PRN

## 2019-01-31 NOTE — Patient Instructions (Signed)
____________________________________________________________________________________________  Medication Rules  Purpose: To inform patients, and their family members, of our rules and regulations.  Applies to: All patients receiving prescriptions (written or electronic).  Pharmacy of record: Pharmacy where electronic prescriptions will be sent. If written prescriptions are taken to a different pharmacy, please inform the nursing staff. The pharmacy listed in the electronic medical record should be the one where you would like electronic prescriptions to be sent.  Electronic prescriptions: In compliance with the [employer] Strengthen Opioid Misuse Prevention (STOP) Act of 2017 (Session Law 2017-74/H243), effective August 18, 2018, all controlled substances must be electronically prescribed. Calling prescriptions to the pharmacy will cease to exist.  Prescription refills: Only during scheduled appointments. Applies to all prescriptions.  NOTE: The following applies primarily to controlled substances (Opioid* Pain Medications).   Patient's responsibilities: 1. Pain Pills: Bring all pain pills to every appointment (except for procedure appointments). 2. Pill Bottles: Bring pills in original pharmacy bottle. Always bring the newest bottle. Bring bottle, even if empty. 3. Medication refills: You are responsible for knowing and keeping track of what medications you take and those you need refilled. The day before your appointment: write a list of all prescriptions that need to be refilled. The day of the appointment: give the list to the admitting nurse. Prescriptions will be written only during appointments. No prescriptions will be written on procedure days. If you forget a medication: it will not be "Called in", "Faxed", or "electronically sent". You will need to get another appointment to get these prescribed. No early refills. Do not call asking to have your prescription filled  early. 4. Prescription Accuracy: You are responsible for carefully inspecting your prescriptions before leaving our office. Have the discharge nurse carefully go over each prescription with you, before taking them home. Make sure that your name is accurately spelled, that your address is correct. Check the name and dose of your medication to make sure it is accurate. Check the number of pills, and the written instructions to make sure they are clear and accurate. Make sure that you are given enough medication to last until your next medication refill appointment. 5. Taking Medication: Take medication as prescribed. When it comes to controlled substances, taking less pills or less frequently than prescribed is permitted and encouraged. Never take more pills than instructed. Never take medication more frequently than prescribed.  6. Inform other Doctors: Always inform, all of your healthcare providers, of all the medications you take. 7. Pain Medication from other Providers: You are not allowed to accept any additional pain medication from any other Doctor or Healthcare provider. There are two exceptions to this rule. (see below) In the event that you require additional pain medication, you are responsible for notifying us, as stated below. 8. Medication Agreement: You are responsible for carefully reading and following our Medication Agreement. This must be signed before receiving any prescriptions from our practice. Safely store a copy of your signed Agreement. Violations to the Agreement will result in no further prescriptions. (Additional copies of our Medication Agreement are available upon request.) 9. Laws, Rules, & Regulations: All patients are expected to follow all Federal and State Laws, Statutes, Rules, & Regulations. Ignorance of the Laws does not constitute a valid excuse. The use of any illegal substances is prohibited. 10. Adopted CDC guidelines & recommendations: Target dosing levels will be  at or below 60 MME/day. Use of benzodiazepines** is not recommended.  Exceptions: There are only two exceptions to the rule of not   receiving pain medications from other Healthcare Providers. 1. Exception #1 (Emergencies): In the event of an emergency (i.e.: accident requiring emergency care), you are allowed to receive additional pain medication. However, you are responsible for: As soon as you are able, call our office [phone], at any time of the day or night, and leave a message stating your name, the date and nature of the emergency, and the name and dose of the medication prescribed. In the event that your call is answered by a member of our staff, make sure to document and save the date, time, and the name of the person that took your information.  2. Exception #2 (Planned Surgery): In the event that you are scheduled by another doctor or dentist to have any type of surgery or procedure, you are allowed (for a period no longer than 30 days), to receive additional pain medication, for the acute post-op pain. However, in this case, you are responsible for picking up a copy of our "Post-op Pain Management for Surgeons" handout, and giving it to your surgeon or dentist. This document is available at our office, and does not require an appointment to obtain it. Simply go to our office during business hours (Monday-Thursday from 8:00 AM to 4:00 PM) (Friday 8:00 AM to 12:00 Noon) or if you have a scheduled appointment with us, prior to your surgery, and ask for it by name. In addition, you will need to provide us with your name, name of your surgeon, type of surgery, and date of procedure or surgery.  *Opioid medications include: morphine, codeine, oxycodone, oxymorphone, hydrocodone, hydromorphone, meperidine, tramadol, tapentadol, buprenorphine, fentanyl, methadone. **Benzodiazepine medications include: diazepam (Valium), alprazolam (Xanax), clonazepam (Klonopine), lorazepam (Ativan), clorazepate  (Tranxene), chlordiazepoxide (Librium), estazolam (Prosom), oxazepam (Serax), temazepam (Restoril), triazolam (Halcion) (Last updated: 10/15/2017) ____________________________________________________________________________________________   ____________________________________________________________________________________________  Medication Recommendations and Reminders  Applies to: All patients receiving prescriptions (written and/or electronic).  Medication Rules & Regulations: These rules and regulations exist for your safety and that of others. They are not flexible and neither are we. Dismissing or ignoring them will be considered "non-compliance" with medication therapy, resulting in complete and irreversible termination of such therapy. (See document titled "Medication Rules" for more details.) In all conscience, because of safety reasons, we cannot continue providing a therapy where the patient does not follow instructions.  Pharmacy of record:   Definition: This is the pharmacy where your electronic prescriptions will be sent.   We do not endorse any particular pharmacy.  You are not restricted in your choice of pharmacy.  The pharmacy listed in the electronic medical record should be the one where you want electronic prescriptions to be sent.  If you choose to change pharmacy, simply notify our nursing staff of your choice of new pharmacy.  Recommendations:  Keep all of your pain medications in a safe place, under lock and key, even if you live alone.   After you fill your prescription, take 1 week's worth of pills and put them away in a safe place. You should keep a separate, properly labeled bottle for this purpose. The remainder should be kept in the original bottle. Use this as your primary supply, until it runs out. Once it's gone, then you know that you have 1 week's worth of medicine, and it is time to come in for a prescription refill. If you do this correctly, it  is unlikely that you will ever run out of medicine.  To make sure that the above recommendation works,   it is very important that you make sure your medication refill appointments are scheduled at least 1 week before you run out of medicine. To do this in an effective manner, make sure that you do not leave the office without scheduling your next medication management appointment. Always ask the nursing staff to show you in your prescription , when your medication will be running out. Then arrange for the receptionist to get you a return appointment, at least 7 days before you run out of medicine. Do not wait until you have 1 or 2 pills left, to come in. This is very poor planning and does not take into consideration that we may need to cancel appointments due to bad weather, sickness, or emergencies affecting our staff.  "Partial Fill": If for any reason your pharmacy does not have enough pills/tablets to completely fill or refill your prescription, do not allow for a "partial fill". You will need a separate prescription to fill the remaining amount, which we will not provide. If the reason for the partial fill is your insurance, you will need to talk to the pharmacist about payment alternatives for the remaining tablets, but again, do not accept a partial fill.  Prescription refills and/or changes in medication(s):   Prescription refills, and/or changes in dose or medication, will be conducted only during scheduled medication management appointments. (Applies to both, written and electronic prescriptions.)  No refills on procedure days. No medication will be changed or started on procedure days. No changes, adjustments, and/or refills will be conducted on a procedure day. Doing so will interfere with the diagnostic portion of the procedure.  No phone refills. No medications will be "called into the pharmacy".  No Fax refills.  No weekend refills.  No Holliday refills.  No after hours  refills.  Remember:  Business hours are:  Monday to Thursday 8:00 AM to 4:00 PM Provider's Schedule: Crystal King, NP - Appointments are:  Medication management: Monday to Thursday 8:00 AM to 4:00 PM Laurinda Carreno, MD - Appointments are:  Medication management: Monday and Wednesday 8:00 AM to 4:00 PM Procedure day: Tuesday and Thursday 7:30 AM to 4:00 PM Bilal Lateef, MD - Appointments are:  Medication management: Tuesday and Thursday 8:00 AM to 4:00 PM Procedure day: Monday and Wednesday 7:30 AM to 4:00 PM (Last update: 10/15/2017) ____________________________________________________________________________________________   ____________________________________________________________________________________________  CANNABIDIOL (AKA: CBD Oil or Pills)  Applies to: All patients receiving prescriptions of controlled substances (written and/or electronic).  General Information: Cannabidiol (CBD) was discovered in 1940. It is one of some 113 identified cannabinoids in cannabis (Marijuana) plants, accounting for up to 40% of the plant's extract. As of 2018, preliminary clinical research on cannabidiol included studies of anxiety, cognition, movement disorders, and pain.  Cannabidiol is consummed in multiple ways, including inhalation of cannabis smoke or vapor, as an aerosol spray into the cheek, and by mouth. It may be supplied as CBD oil containing CBD as the active ingredient (no added tetrahydrocannabinol (THC) or terpenes), a full-plant CBD-dominant hemp extract oil, capsules, dried cannabis, or as a liquid solution. CBD is thought not have the same psychoactivity as THC, and may affect the actions of THC. Studies suggest that CBD may interact with different biological targets, including cannabinoid receptors and other neurotransmitter receptors. As of 2018 the mechanism of action for its biological effects has not been determined.  In the [place], cannabidiol has a limited  approval by the Food and Drug Administration (FDA) for treatment of only two types   of epilepsy disorders. The side effects of long-term use of the drug include somnolence, decreased appetite, diarrhea, fatigue, malaise, weakness, sleeping problems, and others.  CBD remains a Schedule I drug prohibited for any use.  Legality: Some manufacturers ship CBD products nationally, an illegal action which the FDA has not enforced in 2018, with CBD remaining the subject of an FDA investigational new drug evaluation, and is not considered legal as a dietary supplement or food ingredient as of December 2018. Federal illegality has made it difficult historically to conduct research on CBD. CBD is openly sold in head shops and health food stores in some states where such sales have not been explicitly legalized.  Warning: Because it is not FDA approved for general use or treatment of pain, it is not required to undergo the same manufacturing controls as prescription drugs.  This means that the available cannabidiol (CBD) may be contaminated with THC.  If this is the case, it will trigger a positive urine drug screen (UDS) test for cannabinoids (Marijuana).  Because a positive UDS for illicit substances is a violation of our medication agreement, your opioid analgesics (pain medicine) may be permanently discontinued. (Last update: 11/05/2017) ____________________________________________________________________________________________    
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
## 2019-03-07 ENCOUNTER — Other Ambulatory Visit: Payer: Self-pay

## 2019-03-07 ENCOUNTER — Ambulatory Visit (INDEPENDENT_AMBULATORY_CARE_PROVIDER_SITE_OTHER): Payer: Medicare Other | Admitting: Internal Medicine

## 2019-03-07 ENCOUNTER — Encounter: Payer: Self-pay | Admitting: Internal Medicine

## 2019-03-07 VITALS — BP 124/68 | HR 65 | Resp 16 | Ht 65.0 in | Wt 234.0 lb

## 2019-03-07 DIAGNOSIS — J301 Allergic rhinitis due to pollen: Secondary | ICD-10-CM | POA: Diagnosis not present

## 2019-03-07 DIAGNOSIS — J449 Chronic obstructive pulmonary disease, unspecified: Secondary | ICD-10-CM

## 2019-03-07 DIAGNOSIS — G4733 Obstructive sleep apnea (adult) (pediatric): Secondary | ICD-10-CM

## 2019-03-07 DIAGNOSIS — Z9989 Dependence on other enabling machines and devices: Secondary | ICD-10-CM

## 2019-03-07 DIAGNOSIS — R0602 Shortness of breath: Secondary | ICD-10-CM | POA: Diagnosis not present
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
## 2019-04-14 ENCOUNTER — Encounter: Payer: Self-pay | Admitting: Pain Medicine

## 2019-04-17 DIAGNOSIS — M542 Cervicalgia: Secondary | ICD-10-CM | POA: Insufficient documentation
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
## 2019-04-17 NOTE — Patient Instructions (Signed)
____________________________________________________________________________________________  Medication Recommendations and Reminders  Applies to: All patients receiving prescriptions (written and/or electronic).  Medication Rules & Regulations: These rules and regulations exist for your safety and that of others. They are not flexible and neither are we. Dismissing or ignoring them will be considered "non-compliance" with medication therapy, resulting in complete and irreversible termination of such therapy. (See document titled "Medication Rules" for more details.) In all conscience, because of safety reasons, we cannot continue providing a therapy where the patient does not follow instructions.  Pharmacy of record:   Definition: This is the pharmacy where your electronic prescriptions will be sent.   We do not endorse any particular pharmacy.  You are not restricted in your choice of pharmacy.  The pharmacy listed in the electronic medical record should be the one where you want electronic prescriptions to be sent.  If you choose to change pharmacy, simply notify our nursing staff of your choice of new pharmacy.  Recommendations:  Keep all of your pain medications in a safe place, under lock and key, even if you live alone.   After you fill your prescription, take 1 week's worth of pills and put them away in a safe place. You should keep a separate, properly labeled bottle for this purpose. The remainder should be kept in the original bottle. Use this as your primary supply, until it runs out. Once it's gone, then you know that you have 1 week's worth of medicine, and it is time to come in for a prescription refill. If you do this correctly, it is unlikely that you will ever run out of medicine.  To make sure that the above recommendation works, it is very important that you make sure your medication refill appointments are scheduled at least 1 week before you run out of medicine. To do  this in an effective manner, make sure that you do not leave the office without scheduling your next medication management appointment. Always ask the nursing staff to show you in your prescription , when your medication will be running out. Then arrange for the receptionist to get you a return appointment, at least 7 days before you run out of medicine. Do not wait until you have 1 or 2 pills left, to come in. This is very poor planning and does not take into consideration that we may need to cancel appointments due to bad weather, sickness, or emergencies affecting our staff.  "Partial Fill": If for any reason your pharmacy does not have enough pills/tablets to completely fill or refill your prescription, do not allow for a "partial fill". You will need a separate prescription to fill the remaining amount, which we will not provide. If the reason for the partial fill is your insurance, you will need to talk to the pharmacist about payment alternatives for the remaining tablets, but again, do not accept a partial fill.  Prescription refills and/or changes in medication(s):   Prescription refills, and/or changes in dose or medication, will be conducted only during scheduled medication management appointments. (Applies to both, written and electronic prescriptions.)  No refills on procedure days. No medication will be changed or started on procedure days. No changes, adjustments, and/or refills will be conducted on a procedure day. Doing so will interfere with the diagnostic portion of the procedure.  No phone refills. No medications will be "called into the pharmacy".  No Fax refills.  No weekend refills.  No Holliday refills.  No after hours refills.  Remember:  Business   hours are:  Monday to Thursday 8:00 AM to 4:00 PM Provider's Schedule: Lindsee Labarre, MD - Appointments are:  Medication management: Monday and Wednesday 8:00 AM to 4:00 PM Procedure day: Tuesday and Thursday 7:30 AM  to 4:00 PM Bilal Lateef, MD - Appointments are:  Medication management: Tuesday and Thursday 8:00 AM to 4:00 PM Procedure day: Monday and Wednesday 7:30 AM to 4:00 PM (Last update: 10/15/2017) ____________________________________________________________________________________________    

## 2019-04-18 ENCOUNTER — Other Ambulatory Visit: Payer: Self-pay

## 2019-04-18 ENCOUNTER — Ambulatory Visit: Payer: Medicare Other | Attending: Pain Medicine | Admitting: Pain Medicine

## 2019-04-18 DIAGNOSIS — M159 Polyosteoarthritis, unspecified: Secondary | ICD-10-CM

## 2019-04-18 DIAGNOSIS — G894 Chronic pain syndrome: Secondary | ICD-10-CM

## 2019-04-18 DIAGNOSIS — M150 Primary generalized (osteo)arthritis: Secondary | ICD-10-CM

## 2019-04-18 DIAGNOSIS — M5441 Lumbago with sciatica, right side: Secondary | ICD-10-CM

## 2019-04-18 DIAGNOSIS — K5903 Drug induced constipation: Secondary | ICD-10-CM

## 2019-04-18 DIAGNOSIS — M25512 Pain in left shoulder: Secondary | ICD-10-CM

## 2019-04-18 DIAGNOSIS — R0781 Pleurodynia: Secondary | ICD-10-CM

## 2019-04-18 DIAGNOSIS — M542 Cervicalgia: Secondary | ICD-10-CM

## 2019-04-18 DIAGNOSIS — M25511 Pain in right shoulder: Secondary | ICD-10-CM | POA: Diagnosis not present

## 2019-04-18 DIAGNOSIS — M7918 Myalgia, other site: Secondary | ICD-10-CM

## 2019-04-18 DIAGNOSIS — M5442 Lumbago with sciatica, left side: Secondary | ICD-10-CM | POA: Diagnosis not present

## 2019-04-18 DIAGNOSIS — S2242XS Multiple fractures of ribs, left side, sequela: Secondary | ICD-10-CM

## 2019-04-18 DIAGNOSIS — T402X5A Adverse effect of other opioids, initial encounter: Secondary | ICD-10-CM

## 2019-04-18 DIAGNOSIS — G8929 Other chronic pain: Secondary | ICD-10-CM

## 2019-04-18 DIAGNOSIS — Z96611 Presence of right artificial shoulder joint: Secondary | ICD-10-CM

## 2019-04-18 MED ORDER — TRAMADOL HCL 50 MG PO TABS: 100.0000 mg | ORAL_TABLET | Freq: Four times a day (QID) | ORAL | 5 refills | Status: DC | PRN

## 2019-04-18 MED ORDER — TIZANIDINE HCL 4 MG PO CAPS: 4.0000 mg | ORAL_CAPSULE | Freq: Three times a day (TID) | ORAL | 5 refills | Status: DC | PRN

## 2019-04-18 MED ORDER — MELOXICAM 15 MG PO TABS: 15.0000 mg | ORAL_TABLET | Freq: Every day | ORAL | 0 refills | Status: DC

## 2019-04-18 MED ORDER — BISACODYL 5 MG PO TBEC: 10.0000 mg | DELAYED_RELEASE_TABLET | Freq: Every evening | ORAL | 99 refills | Status: DC | PRN

## 2019-04-18 MED ORDER — PREDNISONE 20 MG PO TABS: ORAL_TABLET | ORAL | 0 refills | Status: AC

## 2019-04-18 MED ORDER — BENEFIBER PO POWD: ORAL | 99 refills | Status: DC

## 2019-04-20 MED ORDER — MELOXICAM 15 MG PO TABS: 15.0000 mg | ORAL_TABLET | Freq: Every day | ORAL | 1 refills | Status: DC

## 2019-04-20 MED ORDER — BISACODYL 5 MG PO TBEC: 10.0000 mg | DELAYED_RELEASE_TABLET | Freq: Every evening | ORAL | 99 refills | Status: DC | PRN

## 2019-04-20 MED ORDER — TIZANIDINE HCL 4 MG PO CAPS: 4.0000 mg | ORAL_CAPSULE | Freq: Three times a day (TID) | ORAL | 5 refills | Status: DC | PRN

## 2019-04-20 MED ORDER — BENEFIBER PO POWD: ORAL | 99 refills | Status: DC

## 2019-04-20 NOTE — Addendum Note (Signed)
Addended by: Milinda Pointer A on: 04/20/2019 10:36 AM   Modules accepted: Orders

## 2019-04-27 ENCOUNTER — Ambulatory Visit: Payer: Self-pay

## 2019-05-09 ENCOUNTER — Ambulatory Visit: Payer: Medicare Other | Admitting: Internal Medicine

## 2019-05-18 ENCOUNTER — Ambulatory Visit (INDEPENDENT_AMBULATORY_CARE_PROVIDER_SITE_OTHER): Payer: Medicare Other

## 2019-05-18 ENCOUNTER — Other Ambulatory Visit: Payer: Self-pay

## 2019-05-18 DIAGNOSIS — G4733 Obstructive sleep apnea (adult) (pediatric): Secondary | ICD-10-CM

## 2019-05-18 NOTE — Progress Notes (Signed)
95 percentile pressure 11.4   95th percentile leak 4.3   apnea index 1.3 /hr  apnea-hypopnea index  1.4 /hr   total days used  >4 hr 55 days  total days used <4 hr 3 days  Total compliance 92 percent  No problems or questions at this time

## 2019-05-23 ENCOUNTER — Ambulatory Visit: Payer: Medicare Other | Admitting: Internal Medicine

## 2019-06-02 ENCOUNTER — Encounter: Payer: Self-pay | Admitting: Internal Medicine

## 2019-06-02 ENCOUNTER — Other Ambulatory Visit: Payer: Self-pay

## 2019-06-02 ENCOUNTER — Ambulatory Visit (INDEPENDENT_AMBULATORY_CARE_PROVIDER_SITE_OTHER): Payer: Medicare Other | Admitting: Internal Medicine

## 2019-06-02 VITALS — BP 145/73 | HR 74 | Temp 97.2°F | Resp 16 | Ht 64.0 in | Wt 241.0 lb

## 2019-06-02 DIAGNOSIS — G4733 Obstructive sleep apnea (adult) (pediatric): Secondary | ICD-10-CM | POA: Diagnosis not present

## 2019-06-02 DIAGNOSIS — R0602 Shortness of breath: Secondary | ICD-10-CM | POA: Diagnosis not present

## 2019-06-02 DIAGNOSIS — Z9989 Dependence on other enabling machines and devices: Secondary | ICD-10-CM

## 2019-06-02 DIAGNOSIS — J301 Allergic rhinitis due to pollen: Secondary | ICD-10-CM

## 2019-06-02 DIAGNOSIS — J449 Chronic obstructive pulmonary disease, unspecified: Secondary | ICD-10-CM

## 2019-06-02 NOTE — Progress Notes (Signed)
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 [facility].  I have also discussed any further diagnostic evaluation thatmay be needed or ordered today. Dayrin verbalizes understanding of the findings of todays visit. We also reviewed her medications today and discussed drug interactions and side effects including but not limited excessive drowsiness and altered mental states. We also discussed that there is always a risk not just to her but also people around her. she has been encouraged to call the office with any questions or concerns that should arise related to todays visit.    Time spent: 15 This patient was seen by Orson Gear AGNP-C in Collaboration with Dr. Devona Konig as a part of collaborative care agreement.   I have personally obtained a history, examined the patient, evaluated laboratory and imaging results, formulated the assessment and plan and placed orders.    Allyne Gee, MD [facility] Sleep medicine

## 2019-08-23 ENCOUNTER — Telehealth: Payer: Self-pay | Admitting: Pain Medicine

## 2019-08-23 ENCOUNTER — Telehealth: Payer: Self-pay

## 2019-08-23 NOTE — Telephone Encounter (Signed)
Attempted to call patient regarding pre virtual appointment questions.  LM

## 2019-08-23 NOTE — Progress Notes (Signed)
Unsuccessful attempt to contact patient for Virtual Visit (Pain Management Telehealth)   Patient provided contact information:  [phone] (home); [phone] (mobile); (Preferred) [phone] [email or web address]   Pre-screening:  Our staff was unsuccessful in contacting Regina Campos using the above provided information.   I unsuccessfully attempted to make contact with Regina Campos on 08/24/2019 via telephone. I was unable to complete the virtual encounter due to call going directly to voicemail. I was able to leave a message where I clearly identify myself as Regina Cola, MD and I left a message to call us back to reschedule the call.  Pharmacotherapy Assessment  Analgesic: Tramadol 50 mg, 2 tabs PO q 6 hrs (400 mg/day of tramadol) MME/day:40 mg/day.    Follow-up plan:   Reschedule Visit.     Interventional management options:  Considering:   Possiblebilateral suprascapular RFA  Bilateral intra-articular Hip injection  Diagnostic bilateral cervical facet block  Possible bilateral cervical facet RFA  Diagnostic bilateral lumbar facet block  Possible bilateral lumbar facet RFA  Diagnostic bilateral dorsolateral junction of Cuboid and 5th PMC, foot steroid injection #2  Diagnostic right dorsolateral Tarsometatarsal joint 4, foot steroid injection #2    Palliative PRN treatment(s):   Palliative right L3-4 LESI #3  Palliative right L4 TFESI #3  Palliative right L2-3 LESI #2  Palliative right C7-T1 CESI #2  Palliativeleft IA shoulder joint injection #2  Palliativeright suprascapular NB #2  Palliativeleft suprascapular NB #3  Palliative bilateral IA Hip injection #2  Therapeutic right finger #4 (ring) A-1 pulley area, trigger finger tendon sheath injection #2      Recent Visits No visits were found meeting these conditions.  Showing recent visits within past 90 days and meeting all other requirements   Today's Visits Date Type Provider Dept  08/24/19  Appointment Regina Pointer, MD [facility]  Showing today's visits and meeting all other requirements   Future Appointments Date Type Provider Dept  10/31/19 Appointment Regina Pointer, MD [facility]  Showing future appointments within next 90 days and meeting all other requirements    Note by: Regina Cola, MD Date: 08/24/2019; Time: 4:05 PM

## 2019-08-23 NOTE — Telephone Encounter (Signed)
Patient returned call to update chart for Wed appt.

## 2019-08-24 ENCOUNTER — Ambulatory Visit: Payer: Medicare Other | Attending: Pain Medicine | Admitting: Pain Medicine

## 2019-08-24 ENCOUNTER — Other Ambulatory Visit: Payer: Self-pay

## 2019-08-24 DIAGNOSIS — G894 Chronic pain syndrome: Secondary | ICD-10-CM

## 2019-09-06 ENCOUNTER — Other Ambulatory Visit: Admission: RE | Admit: 2019-09-06 | Source: Ambulatory Visit

## 2019-09-08 ENCOUNTER — Encounter: Admission: RE | Payer: Self-pay | Source: Home / Self Care

## 2019-09-08 ENCOUNTER — Ambulatory Visit: Admission: RE | Admit: 2019-09-08 | Payer: Medicare Other | Source: Home / Self Care

## 2019-09-08 SURGERY — PHACOEMULSIFICATION, CATARACT, WITH IOL INSERTION
Anesthesia: Topical | Laterality: Right

## 2019-09-30 ENCOUNTER — Other Ambulatory Visit: Payer: Self-pay | Admitting: Pain Medicine

## 2019-09-30 DIAGNOSIS — G8929 Other chronic pain: Secondary | ICD-10-CM

## 2019-10-10 ENCOUNTER — Other Ambulatory Visit: Payer: Self-pay

## 2019-10-10 ENCOUNTER — Encounter: Payer: Self-pay | Admitting: *Deleted

## 2019-10-11 ENCOUNTER — Encounter: Payer: Self-pay | Admitting: Ophthalmology

## 2019-10-11 ENCOUNTER — Other Ambulatory Visit
Admission: RE | Admit: 2019-10-11 | Discharge: 2019-10-11 | Disposition: A | Discharge status: Home / Self Care | Payer: Medicare Other | Source: Ambulatory Visit | Attending: Ophthalmology | Admitting: Ophthalmology

## 2019-10-11 DIAGNOSIS — Z01812 Encounter for preprocedural laboratory examination: Secondary | ICD-10-CM | POA: Diagnosis present

## 2019-10-11 DIAGNOSIS — Z20822 Contact with and (suspected) exposure to covid-19: Secondary | ICD-10-CM | POA: Diagnosis not present

## 2019-10-11 LAB — SARS CORONAVIRUS 2 (TAT 6-24 HRS): SARS Coronavirus 2: NEGATIVE

## 2019-10-12 NOTE — Discharge Instructions (Signed)

## 2019-10-13 ENCOUNTER — Encounter: Payer: Self-pay | Admitting: Ophthalmology

## 2019-10-13 ENCOUNTER — Ambulatory Visit
Admission: RE | Admit: 2019-10-13 | Discharge: 2019-10-13 | Disposition: A | Discharge status: Home / Self Care | Payer: Medicare Other | Attending: Ophthalmology | Admitting: Ophthalmology

## 2019-10-13 ENCOUNTER — Ambulatory Visit: Payer: Medicare Other | Admitting: Anesthesiology

## 2019-10-13 ENCOUNTER — Encounter
Admission: RE | Disposition: A | Discharge status: Home / Self Care | Payer: Self-pay | Source: Home / Self Care | Attending: Ophthalmology

## 2019-10-13 ENCOUNTER — Other Ambulatory Visit: Payer: Self-pay

## 2019-10-13 DIAGNOSIS — Z90710 Acquired absence of both cervix and uterus: Secondary | ICD-10-CM | POA: Diagnosis not present

## 2019-10-13 DIAGNOSIS — Z96611 Presence of right artificial shoulder joint: Secondary | ICD-10-CM | POA: Diagnosis not present

## 2019-10-13 DIAGNOSIS — E039 Hypothyroidism, unspecified: Secondary | ICD-10-CM | POA: Insufficient documentation

## 2019-10-13 DIAGNOSIS — G709 Myoneural disorder, unspecified: Secondary | ICD-10-CM | POA: Diagnosis not present

## 2019-10-13 DIAGNOSIS — G4730 Sleep apnea, unspecified: Secondary | ICD-10-CM | POA: Insufficient documentation

## 2019-10-13 DIAGNOSIS — J449 Chronic obstructive pulmonary disease, unspecified: Secondary | ICD-10-CM | POA: Diagnosis not present

## 2019-10-13 DIAGNOSIS — Z981 Arthrodesis status: Secondary | ICD-10-CM | POA: Insufficient documentation

## 2019-10-13 DIAGNOSIS — Z6841 Body Mass Index (BMI) 40.0 and over, adult: Secondary | ICD-10-CM | POA: Insufficient documentation

## 2019-10-13 DIAGNOSIS — E7800 Pure hypercholesterolemia, unspecified: Secondary | ICD-10-CM | POA: Insufficient documentation

## 2019-10-13 DIAGNOSIS — H2511 Age-related nuclear cataract, right eye: Secondary | ICD-10-CM | POA: Insufficient documentation

## 2019-10-13 DIAGNOSIS — Z96653 Presence of artificial knee joint, bilateral: Secondary | ICD-10-CM | POA: Insufficient documentation

## 2019-10-13 HISTORY — PX: CATARACT EXTRACTION W/PHACO: SHX586

## 2019-10-13 HISTORY — DX: Dizziness and giddiness: R42

## 2019-10-13 HISTORY — DX: Dorsopathy, unspecified: M53.9

## 2019-10-13 SURGERY — PHACOEMULSIFICATION, CATARACT, WITH IOL INSERTION
Anesthesia: Monitor Anesthesia Care | Site: Eye | Laterality: Right

## 2019-10-13 MED ORDER — MIDAZOLAM HCL 2 MG/2ML IJ SOLN
INTRAMUSCULAR | Status: DC | PRN
  Administered 2019-10-13 (×2): 1 mg via INTRAVENOUS

## 2019-10-13 MED ORDER — TETRACAINE 0.5 % OP SOLN OPTIME - NO CHARGE
OPHTHALMIC | Status: DC | PRN
  Administered 2019-10-13: 1 [drp] via OPHTHALMIC

## 2019-10-13 MED ORDER — MOXIFLOXACIN HCL 0.5 % OP SOLN
OPHTHALMIC | Status: DC | PRN
  Administered 2019-10-13: 0.2 mL via OPHTHALMIC

## 2019-10-13 MED ORDER — ACETAMINOPHEN 325 MG PO TABS: 325.0000 mg | ORAL_TABLET | Freq: Once | ORAL | Status: DC

## 2019-10-13 MED ORDER — LACTATED RINGERS IV SOLN: 10.0000 mL/h | INTRAVENOUS | Status: DC

## 2019-10-13 MED ORDER — NEOMYCIN-POLYMYXIN-DEXAMETH 3.5-10000-0.1 OP OINT
TOPICAL_OINTMENT | OPHTHALMIC | Status: DC | PRN
  Administered 2019-10-13: 1 via OPHTHALMIC

## 2019-10-13 MED ORDER — EPINEPHRINE PF 1 MG/ML IJ SOLN
INTRAOCULAR | Status: DC | PRN
  Administered 2019-10-13: 58 mL via OPHTHALMIC

## 2019-10-13 MED ORDER — PROVISC 10 MG/ML IO SOLN
INTRAOCULAR | Status: DC | PRN
  Administered 2019-10-13: 0.55 mL via INTRAOCULAR

## 2019-10-13 MED ORDER — ACETAMINOPHEN 160 MG/5ML PO SOLN: 325.0000 mg | Freq: Once | ORAL | Status: DC

## 2019-10-13 MED ORDER — TETRACAINE HCL 0.5 % OP SOLN
1.0000 [drp] | OPHTHALMIC | Status: DC | PRN
  Administered 2019-10-13 (×3): 1 [drp] via OPHTHALMIC

## 2019-10-13 MED ORDER — ARMC OPHTHALMIC DILATING DROPS
1.0000 "application " | OPHTHALMIC | Status: DC | PRN
  Administered 2019-10-13 (×3): 1 via OPHTHALMIC

## 2019-10-13 MED ORDER — LIDOCAINE HCL (PF) 2 % IJ SOLN
INTRAOCULAR | Status: DC | PRN
  Administered 2019-10-13: 2 mL via INTRAOCULAR

## 2019-10-13 MED ORDER — NA CHONDROIT SULF-NA HYALURON 40-17 MG/ML IO SOLN
INTRAOCULAR | Status: DC | PRN
  Administered 2019-10-13: 1 mL via INTRAOCULAR

## 2019-10-13 MED ORDER — FENTANYL CITRATE (PF) 100 MCG/2ML IJ SOLN
INTRAMUSCULAR | Status: DC | PRN
  Administered 2019-10-13 (×2): 50 ug via INTRAVENOUS

## 2019-10-13 MED ORDER — TRYPAN BLUE 0.06 % OP SOLN
OPHTHALMIC | Status: DC | PRN
  Administered 2019-10-13: 0.5 mL via INTRAOCULAR

## 2019-10-13 SURGICAL SUPPLY — 17 items
DISSECTOR HYDRO NUCLEUS 50X22 (MISCELLANEOUS) ×8 IMPLANT
DRSG TEGADERM 2-3/8X2-3/4 SM (GAUZE/BANDAGES/DRESSINGS) ×2 IMPLANT
GLOVE BIOGEL PI IND STRL 8 (GLOVE) ×1 IMPLANT
GLOVE BIOGEL PI INDICATOR 8 (GLOVE) ×1
GOWN STRL REUS W/ TWL LRG LVL3 (GOWN DISPOSABLE) ×1 IMPLANT
GOWN STRL REUS W/ TWL XL LVL3 (GOWN DISPOSABLE) ×1 IMPLANT
GOWN STRL REUS W/TWL LRG LVL3 (GOWN DISPOSABLE) ×1
GOWN STRL REUS W/TWL XL LVL3 (GOWN DISPOSABLE) ×1
KNIFE 45D UP 2.3 (MISCELLANEOUS) ×2 IMPLANT
LENS IOL ACRYSOF IQ TORIC 16.0 ×1 IMPLANT
LENS IOL IQ TORIC 3 16.0 ×1 IMPLANT
PACK CATARACT (MISCELLANEOUS) ×2 IMPLANT
PACK DR. KING ARMS (PACKS) ×2 IMPLANT
PACK EYE AFTER SURG (MISCELLANEOUS) ×2 IMPLANT
SOLUTION OPHTHALMIC SALT (MISCELLANEOUS) ×2 IMPLANT
WATER STERILE IRR 250ML POUR (IV SOLUTION) ×2 IMPLANT
WIPE NON LINTING 3.25X3.25 (MISCELLANEOUS) ×2 IMPLANT

## 2019-10-13 NOTE — Anesthesia Procedure Notes (Signed)
Procedure Name: MAC Date/Time: 10/13/2019 8:44 AM Performed by: Vanetta Shawl, CRNA Pre-anesthesia Checklist: Patient identified, Emergency Drugs available, Suction available, Timeout performed and Patient being monitored Patient Re-evaluated:Patient Re-evaluated prior to induction Oxygen Delivery Method: Nasal cannula Placement Confirmation: positive ETCO2

## 2019-10-13 NOTE — Transfer of Care (Signed)
Immediate Anesthesia Transfer of Care Note  Patient: Regina Campos  Procedure(s) Performed: CATARACT EXTRACTION PHACO AND INTRAOCULAR LENS PLACEMENT (IOC) RIGHT TORIC LENS VISION BLUE CDE:  8.38, Total U/S Time:  00:51.2, FP3:  16.4% (Right Eye)  Patient Location: PACU  Anesthesia Type: MAC  Level of Consciousness: awake, alert  and patient cooperative  Airway and Oxygen Therapy: Patient Spontanous Breathing   Post-op Assessment: Post-op Vital signs reviewed, Patient's Cardiovascular Status Stable, Respiratory Function Stable, Patent Airway and No signs of Nausea or vomiting  Post-op Vital Signs: Reviewed and stable  Complications: No apparent anesthesia complications

## 2019-10-13 NOTE — Anesthesia Postprocedure Evaluation (Signed)
Anesthesia Post Note  Patient: Regina Campos  Procedure(s) Performed: CATARACT EXTRACTION PHACO AND INTRAOCULAR LENS PLACEMENT (IOC) RIGHT TORIC LENS VISION BLUE CDE:  8.38, Total U/S Time:  00:51.2, FP3:  16.4% (Right Eye)     Patient location during evaluation: PACU Anesthesia Type: MAC Level of consciousness: awake and alert and oriented Pain management: satisfactory to patient Vital Signs Assessment: post-procedure vital signs reviewed and stable Respiratory status: spontaneous breathing, nonlabored ventilation and respiratory function stable Cardiovascular status: blood pressure returned to baseline and stable Postop Assessment: Adequate PO intake and No signs of nausea or vomiting Anesthetic complications: no    Raliegh Ip

## 2019-10-13 NOTE — H&P (Signed)
   I have reviewed the patient's H&P and agree with its findings. There have been no interval changes.  Zacharia Sowles MD Ophthalmology 

## 2019-10-13 NOTE — Op Note (Signed)
  PREOPERATIVE DIAGNOSIS:  Nuclear sclerotic cataract of the RIGHT eye.   POSTOPERATIVE DIAGNOSIS:  Nuclear sclerotic cataract of the RIGHT eye.   OPERATIVE PROCEDURE: Cataract surgery OD   SURGEON:  Marchia Meiers, MD.   ANESTHESIA:  Anesthesiologist: Ronelle Nigh, MD CRNA: Vanetta Shawl, CRNA  1.      Managed anesthesia care. 2.     0.3ml of Shugarcaine was instilled following the paracentesis   COMPLICATIONS:  None.   TECHNIQUE:   Divide and conquer   DESCRIPTION OF PROCEDURE:  The patient was examined and consented in the preoperative holding area where the aforementioned topical anesthesia was applied to the RIGHT eye and then brought back to the Operating Room where the RIGHT eye was prepped and draped in the usual sterile ophthalmic fashion and a lid speculum was placed.  The axis of the eyes were manually measured, and marks were made at the 180 degree, 90 degree, and 83 degree axis for placement of the toric lens.    A paracentesis was created with the side port blade, the anterior chamber was washed out with trypan blue to stain the anterior capsule, and the anterior chamber was filled with viscoelastic. A near clear corneal incision was performed with the steel keratome. A continuous curvilinear capsulorrhexis was performed with a cystotome followed by the capsulorrhexis forceps. Hydrodissection and hydrodelineation were carried out with BSS on a blunt cannula. The lens was removed in a divide and conquer  technique and the remaining cortical material was removed with the irrigation-aspiration handpiece. The capsular bag was inflated with viscoelastic and the lens was placed in the capsular bag without complication. The remaining viscoelastic was removed from the eye with the irrigation-aspiration handpiece. The lens was placed at the 83 degree axis, as had been planned preoperatively. The wounds were hydrated. The anterior chamber was flushed and the eye was inflated to  physiologic pressure. 0.44ml Vigamox was placed in the anterior chamber. The wounds were found to be water tight. The eye was dressed with Vigamox. The patient was given protective glasses to wear throughout the day and a shield with which to sleep tonight. The patient was also given drops with which to begin a drop regimen today and will follow-up with me in one day. Implant Name Type Inv. Item Serial No. Manufacturer Lot No. LRB No. Used Action  LENS IOL TORIC 16.0 - [id number]  LENS IOL TORIC 16.0 [id number] [employer]  Right 1 Implanted    Procedure(s): CATARACT EXTRACTION PHACO AND INTRAOCULAR LENS PLACEMENT (IOC) RIGHT TORIC LENS VISION BLUE CDE:  8.38, Total U/S Time:  00:51.2, FP3:  16.4% (Right)  Electronically signed: Marchia Meiers 10/13/2019 9:43 AM

## 2019-10-13 NOTE — Anesthesia Preprocedure Evaluation (Signed)
Anesthesia Evaluation  Patient identified by MRN, date of birth, ID band Patient awake    Reviewed: Allergy & Precautions, H&P , NPO status , Patient's Chart, lab work & pertinent test results  Airway Mallampati: II  TM Distance: >3 FB Neck ROM: full    Dental no notable dental hx.    Pulmonary sleep apnea , COPD,    Pulmonary exam normal breath sounds clear to auscultation       Cardiovascular Normal cardiovascular exam Rhythm:regular Rate:Normal     Neuro/Psych PSYCHIATRIC DISORDERS  Neuromuscular disease    GI/Hepatic   Endo/Other  Hypothyroidism Morbid obesity  Renal/GU      Musculoskeletal   Abdominal   Peds  Hematology   Anesthesia Other Findings   Reproductive/Obstetrics                             Anesthesia Physical Anesthesia Plan  ASA: III  Anesthesia Plan: MAC   Post-op Pain Management:    Induction:   PONV Risk Score and Plan: 2 and Treatment may vary due to age or medical condition, Midazolam and TIVA  Airway Management Planned:   Additional Equipment:   Intra-op Plan:   Post-operative Plan:   Informed Consent: I have reviewed the patients History and Physical, chart, labs and discussed the procedure including the risks, benefits and alternatives for the proposed anesthesia with the patient or authorized representative who has indicated his/her understanding and acceptance.     Dental Advisory Given  Plan Discussed with: CRNA  Anesthesia Plan Comments:         Anesthesia Quick Evaluation

## 2019-10-14 ENCOUNTER — Encounter: Payer: Self-pay | Admitting: *Deleted

## 2019-10-26 ENCOUNTER — Encounter: Payer: Self-pay | Admitting: Pain Medicine

## 2019-10-30 NOTE — Progress Notes (Signed)
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  Diagnostic right dorsolateral Tarsometatarsal joint 4, foot steroid injection #2    Palliative PRN treatment(s):   Palliative right L3-4 LESI #3  Palliative right L4 TFESI #3  Palliative right L2-3 LESI #2  Palliative right C7-T1 CESI #2  Palliativeleft IA shoulder joint injection #2  Palliativeright suprascapular NB #2  Palliativeleft suprascapular NB #3  Palliative bilateral IA Hip injection #2  Therapeutic right finger #4 (ring) A-1 pulley area, trigger finger tendon sheath injection #2       Recent Visits Date Type Provider Dept  08/24/19 Telemedicine Milinda Pointer, MD [facility]  Showing recent visits within past 90  days and meeting all other requirements   Today's Visits Date Type Provider Dept  10/31/19 Telemedicine Milinda Pointer, MD [facility]  Showing today's visits and meeting all other requirements   Future Appointments No visits were found meeting these conditions.  Showing future appointments within next 90 days and meeting all other requirements   I discussed the assessment and treatment plan with the patient. The patient was provided an opportunity to ask questions and all were answered. The patient agreed with the plan and demonstrated an understanding of the instructions.  Patient advised to call back or seek an in-person evaluation if the symptoms or condition worsens.  Duration of encounter: 13 minutes.  Note by: Gaspar Cola, MD Date: 10/31/2019; Time: 10:23 AM

## 2019-10-31 ENCOUNTER — Other Ambulatory Visit: Payer: Self-pay

## 2019-10-31 ENCOUNTER — Ambulatory Visit: Payer: Medicare Other | Attending: Pain Medicine | Admitting: Pain Medicine

## 2019-10-31 DIAGNOSIS — M159 Polyosteoarthritis, unspecified: Secondary | ICD-10-CM

## 2019-10-31 DIAGNOSIS — M25511 Pain in right shoulder: Secondary | ICD-10-CM | POA: Diagnosis not present

## 2019-10-31 DIAGNOSIS — M25512 Pain in left shoulder: Secondary | ICD-10-CM

## 2019-10-31 DIAGNOSIS — R0781 Pleurodynia: Secondary | ICD-10-CM

## 2019-10-31 DIAGNOSIS — M19011 Primary osteoarthritis, right shoulder: Secondary | ICD-10-CM | POA: Diagnosis not present

## 2019-10-31 DIAGNOSIS — M8949 Other hypertrophic osteoarthropathy, multiple sites: Secondary | ICD-10-CM

## 2019-10-31 DIAGNOSIS — M7918 Myalgia, other site: Secondary | ICD-10-CM | POA: Diagnosis not present

## 2019-10-31 DIAGNOSIS — M19012 Primary osteoarthritis, left shoulder: Secondary | ICD-10-CM

## 2019-10-31 DIAGNOSIS — G8929 Other chronic pain: Secondary | ICD-10-CM

## 2019-10-31 DIAGNOSIS — G894 Chronic pain syndrome: Secondary | ICD-10-CM

## 2019-10-31 MED ORDER — TRAMADOL HCL 50 MG PO TABS: 100.0000 mg | ORAL_TABLET | Freq: Four times a day (QID) | ORAL | 5 refills | Status: DC | PRN

## 2019-10-31 MED ORDER — TIZANIDINE HCL 4 MG PO CAPS: 4.0000 mg | ORAL_CAPSULE | Freq: Every evening | ORAL | 0 refills | Status: DC | PRN

## 2019-10-31 MED ORDER — MELOXICAM 15 MG PO TABS: 15.0000 mg | ORAL_TABLET | Freq: Every day | ORAL | 0 refills | Status: DC

## 2019-10-31 NOTE — Patient Instructions (Signed)

## 2019-11-08 ENCOUNTER — Encounter: Admitting: Pain Medicine

## 2019-11-08 NOTE — Progress Notes (Signed)
Rescheduled. Patient cannot drive due to dizziness.

## 2019-11-14 ENCOUNTER — Telehealth: Payer: Self-pay

## 2019-11-14 NOTE — Telephone Encounter (Signed)
Left message confirming pt PFT appt

## 2019-11-16 ENCOUNTER — Encounter: Payer: Self-pay | Admitting: Ophthalmology

## 2019-11-16 ENCOUNTER — Ambulatory Visit (INDEPENDENT_AMBULATORY_CARE_PROVIDER_SITE_OTHER): Payer: Medicare Other

## 2019-11-16 ENCOUNTER — Other Ambulatory Visit: Payer: Self-pay

## 2019-11-16 DIAGNOSIS — G4733 Obstructive sleep apnea (adult) (pediatric): Secondary | ICD-10-CM | POA: Diagnosis not present

## 2019-11-16 NOTE — Progress Notes (Signed)
95 percentile pressure 11.4   95th percentile leak 4.0   apnea index 1.2 /hr  apnea-hypopnea index  1.2 /hr   total days used  >4 hr 88 days  total days used <4 hr 2 days  Total compliance 98 percent  She is doing great on cpap no problems or questions at this time

## 2019-11-16 NOTE — Anesthesia Preprocedure Evaluation (Addendum)
Anesthesia Evaluation  Patient identified by MRN, date of birth, ID band Patient awake    Reviewed: Allergy & Precautions, H&P , NPO status , Patient's Chart, lab work & pertinent test results  History of Anesthesia Complications Negative for: history of anesthetic complications  Airway Mallampati: II  TM Distance: >3 FB Neck ROM: full    Dental no notable dental hx.    Pulmonary sleep apnea , COPD,    Pulmonary exam normal breath sounds clear to auscultation       Cardiovascular (-) angina(-) DOE Normal cardiovascular exam Rhythm:regular Rate:Normal   HLD   Neuro/Psych  Headaches, PSYCHIATRIC DISORDERS Depression  Neuromuscular disease    GI/Hepatic neg GERD  ,  Endo/Other  Hypothyroidism Morbid obesity  Renal/GU      Musculoskeletal  (+) Arthritis , Osteoarthritis,  Fibromyalgia -  Abdominal (+) + obese (BMI 40),   Peds  Hematology   Anesthesia Other Findings   Reproductive/Obstetrics                            Anesthesia Physical  Anesthesia Plan  ASA: III  Anesthesia Plan: MAC   Post-op Pain Management:    Induction: Intravenous  PONV Risk Score and Plan: 2 and Treatment may vary due to age or medical condition, Midazolam and TIVA  Airway Management Planned: Nasal Cannula  Additional Equipment:   Intra-op Plan:   Post-operative Plan:   Informed Consent: I have reviewed the patients History and Physical, chart, labs and discussed the procedure including the risks, benefits and alternatives for the proposed anesthesia with the patient or authorized representative who has indicated his/her understanding and acceptance.     Dental Advisory Given  Plan Discussed with: CRNA  Anesthesia Plan Comments:         Anesthesia Quick Evaluation

## 2019-11-22 ENCOUNTER — Other Ambulatory Visit
Admission: RE | Admit: 2019-11-22 | Discharge: 2019-11-22 | Disposition: A | Discharge status: Home / Self Care | Payer: Medicare Other | Source: Ambulatory Visit | Attending: Ophthalmology | Admitting: Ophthalmology

## 2019-11-22 DIAGNOSIS — Z20822 Contact with and (suspected) exposure to covid-19: Secondary | ICD-10-CM | POA: Diagnosis not present

## 2019-11-22 DIAGNOSIS — Z01812 Encounter for preprocedural laboratory examination: Secondary | ICD-10-CM | POA: Diagnosis present

## 2019-11-22 LAB — SARS CORONAVIRUS 2 (TAT 6-24 HRS): SARS Coronavirus 2: NEGATIVE

## 2019-11-22 NOTE — Discharge Instructions (Signed)

## 2019-11-24 ENCOUNTER — Ambulatory Visit: Payer: Medicare Other | Admitting: Anesthesiology

## 2019-11-24 ENCOUNTER — Other Ambulatory Visit: Payer: Self-pay

## 2019-11-24 ENCOUNTER — Encounter
Admission: RE | Disposition: A | Discharge status: Home / Self Care | Payer: Self-pay | Source: Home / Self Care | Attending: Ophthalmology

## 2019-11-24 ENCOUNTER — Ambulatory Visit
Admission: RE | Admit: 2019-11-24 | Discharge: 2019-11-24 | Disposition: A | Discharge status: Home / Self Care | Payer: Medicare Other | Attending: Ophthalmology | Admitting: Ophthalmology

## 2019-11-24 DIAGNOSIS — E039 Hypothyroidism, unspecified: Secondary | ICD-10-CM | POA: Insufficient documentation

## 2019-11-24 DIAGNOSIS — E7800 Pure hypercholesterolemia, unspecified: Secondary | ICD-10-CM | POA: Insufficient documentation

## 2019-11-24 DIAGNOSIS — M797 Fibromyalgia: Secondary | ICD-10-CM | POA: Insufficient documentation

## 2019-11-24 DIAGNOSIS — Z90710 Acquired absence of both cervix and uterus: Secondary | ICD-10-CM | POA: Diagnosis not present

## 2019-11-24 DIAGNOSIS — H2512 Age-related nuclear cataract, left eye: Secondary | ICD-10-CM | POA: Insufficient documentation

## 2019-11-24 DIAGNOSIS — Z9981 Dependence on supplemental oxygen: Secondary | ICD-10-CM | POA: Insufficient documentation

## 2019-11-24 DIAGNOSIS — E785 Hyperlipidemia, unspecified: Secondary | ICD-10-CM | POA: Diagnosis not present

## 2019-11-24 DIAGNOSIS — G43909 Migraine, unspecified, not intractable, without status migrainosus: Secondary | ICD-10-CM | POA: Diagnosis not present

## 2019-11-24 DIAGNOSIS — Z981 Arthrodesis status: Secondary | ICD-10-CM | POA: Diagnosis not present

## 2019-11-24 DIAGNOSIS — F329 Major depressive disorder, single episode, unspecified: Secondary | ICD-10-CM | POA: Insufficient documentation

## 2019-11-24 DIAGNOSIS — G4730 Sleep apnea, unspecified: Secondary | ICD-10-CM | POA: Insufficient documentation

## 2019-11-24 DIAGNOSIS — G709 Myoneural disorder, unspecified: Secondary | ICD-10-CM | POA: Insufficient documentation

## 2019-11-24 DIAGNOSIS — M1990 Unspecified osteoarthritis, unspecified site: Secondary | ICD-10-CM | POA: Insufficient documentation

## 2019-11-24 DIAGNOSIS — R42 Dizziness and giddiness: Secondary | ICD-10-CM | POA: Diagnosis not present

## 2019-11-24 DIAGNOSIS — J449 Chronic obstructive pulmonary disease, unspecified: Secondary | ICD-10-CM | POA: Diagnosis not present

## 2019-11-24 DIAGNOSIS — Z6841 Body Mass Index (BMI) 40.0 and over, adult: Secondary | ICD-10-CM | POA: Diagnosis not present

## 2019-11-24 DIAGNOSIS — Z96653 Presence of artificial knee joint, bilateral: Secondary | ICD-10-CM | POA: Insufficient documentation

## 2019-11-24 HISTORY — PX: CATARACT EXTRACTION W/PHACO: SHX586

## 2019-11-24 SURGERY — PHACOEMULSIFICATION, CATARACT, WITH IOL INSERTION
Anesthesia: Monitor Anesthesia Care | Site: Eye | Laterality: Left

## 2019-11-24 MED ORDER — TRYPAN BLUE 0.06 % OP SOLN
OPHTHALMIC | Status: DC | PRN
  Administered 2019-11-24: 0.5 mL via INTRAOCULAR

## 2019-11-24 MED ORDER — FENTANYL CITRATE (PF) 100 MCG/2ML IJ SOLN
INTRAMUSCULAR | Status: DC | PRN
  Administered 2019-11-24 (×2): 50 ug via INTRAVENOUS

## 2019-11-24 MED ORDER — ARMC OPHTHALMIC DILATING DROPS
1.0000 "application " | OPHTHALMIC | Status: DC | PRN
  Administered 2019-11-24 (×3): 1 via OPHTHALMIC

## 2019-11-24 MED ORDER — MOXIFLOXACIN HCL 0.5 % OP SOLN
OPHTHALMIC | Status: DC | PRN
  Administered 2019-11-24: 0.2 mL via OPHTHALMIC

## 2019-11-24 MED ORDER — ACETAMINOPHEN 10 MG/ML IV SOLN: 1000.0000 mg | Freq: Once | INTRAVENOUS | Status: DC | PRN

## 2019-11-24 MED ORDER — LACTATED RINGERS IV SOLN: 100.0000 mL/h | INTRAVENOUS | Status: DC

## 2019-11-24 MED ORDER — PROVISC 10 MG/ML IO SOLN
INTRAOCULAR | Status: DC | PRN
  Administered 2019-11-24: 0.55 mL via INTRAOCULAR

## 2019-11-24 MED ORDER — NA CHONDROIT SULF-NA HYALURON 40-17 MG/ML IO SOLN
INTRAOCULAR | Status: DC | PRN
  Administered 2019-11-24: 1 mL via INTRAOCULAR

## 2019-11-24 MED ORDER — MIDAZOLAM HCL 2 MG/2ML IJ SOLN
INTRAMUSCULAR | Status: DC | PRN
  Administered 2019-11-24 (×2): 1 mg via INTRAVENOUS

## 2019-11-24 MED ORDER — BRIMONIDINE TARTRATE-TIMOLOL 0.2-0.5 % OP SOLN
OPHTHALMIC | Status: DC | PRN
  Administered 2019-11-24: 1 [drp] via OPHTHALMIC

## 2019-11-24 MED ORDER — LIDOCAINE HCL (PF) 2 % IJ SOLN
INTRAOCULAR | Status: DC | PRN
  Administered 2019-11-24: 1 mL via INTRAOCULAR

## 2019-11-24 MED ORDER — EPINEPHRINE PF 1 MG/ML IJ SOLN
INTRAOCULAR | Status: DC | PRN
  Administered 2019-11-24: 63 mL via OPHTHALMIC

## 2019-11-24 MED ORDER — TETRACAINE 0.5 % OP SOLN OPTIME - NO CHARGE
OPHTHALMIC | Status: DC | PRN
  Administered 2019-11-24: 2 [drp] via OPHTHALMIC

## 2019-11-24 MED ORDER — ONDANSETRON HCL 4 MG/2ML IJ SOLN: 4.0000 mg | Freq: Once | INTRAMUSCULAR | Status: DC | PRN

## 2019-11-24 MED ORDER — TETRACAINE HCL 0.5 % OP SOLN
1.0000 [drp] | OPHTHALMIC | Status: DC | PRN
  Administered 2019-11-24 (×3): 1 [drp] via OPHTHALMIC

## 2019-11-24 SURGICAL SUPPLY — 17 items
DISSECTOR HYDRO NUCLEUS 50X22 (MISCELLANEOUS) ×12 IMPLANT
DRSG TEGADERM 2-3/8X2-3/4 SM (GAUZE/BANDAGES/DRESSINGS) ×3 IMPLANT
GLOVE BIOGEL PI IND STRL 8 (GLOVE) ×1 IMPLANT
GLOVE BIOGEL PI INDICATOR 8 (GLOVE) ×2
GOWN STRL REUS W/ TWL LRG LVL3 (GOWN DISPOSABLE) ×1 IMPLANT
GOWN STRL REUS W/ TWL XL LVL3 (GOWN DISPOSABLE) ×1 IMPLANT
GOWN STRL REUS W/TWL LRG LVL3 (GOWN DISPOSABLE) ×2
GOWN STRL REUS W/TWL XL LVL3 (GOWN DISPOSABLE) ×2
KNIFE 45D UP 2.3 (MISCELLANEOUS) ×3 IMPLANT
LENS IOL DIOP 21.0 (Intraocular Lens) ×3 IMPLANT
LENS IOL TECNIS MONO 21.0 (Intraocular Lens) ×1 IMPLANT
PACK CATARACT (MISCELLANEOUS) ×3 IMPLANT
PACK DR. KING ARMS (PACKS) ×3 IMPLANT
PACK EYE AFTER SURG (MISCELLANEOUS) ×3 IMPLANT
SOLUTION OPHTHALMIC SALT (MISCELLANEOUS) ×3 IMPLANT
WATER STERILE IRR 250ML POUR (IV SOLUTION) ×3 IMPLANT
WIPE NON LINTING 3.25X3.25 (MISCELLANEOUS) ×3 IMPLANT

## 2019-11-24 NOTE — Op Note (Signed)
  PREOPERATIVE DIAGNOSIS:  Nuclear sclerotic cataract of the LEFT eye.   POSTOPERATIVE DIAGNOSIS:  Nuclear sclerotic cataract of the LEFT eye.   OPERATIVE PROCEDURE: Cataract surgery OS   SURGEON:  Marchia Meiers, MD.   ANESTHESIA:  Anesthesiologist: Heniser, Fredric Dine, MD CRNA: Vanetta Shawl, CRNA  1.      Managed anesthesia care. 2.     0.38ml of Shugarcaine was instilled following the paracentesis   COMPLICATIONS:  None.   TECHNIQUE:   Divide and conquer   DESCRIPTION OF PROCEDURE:  The patient was examined and consented in the preoperative holding area where the aforementioned topical anesthesia was applied to the LEFT eye and then brought back to the Operating Room where the left eye was prepped and draped in the usual sterile ophthalmic fashion and a lid speculum was placed. A paracentesis was created with the side port blade, the anterior chamber was washed out with trypan blue to stain the anterior capsule, and the anterior chamber was filled with viscoelastic. A near clear corneal incision was performed with the steel keratome. A continuous curvilinear capsulorrhexis was performed with a cystotome followed by the capsulorrhexis forceps. Hydrodissection and hydrodelineation were carried out with BSS on a blunt cannula. The lens was removed in a divide and conquer  technique and the remaining cortical material was removed with the irrigation-aspiration handpiece. The capsular bag was inflated with viscoelastic and the lens was placed in the capsular bag without complication. The remaining viscoelastic was removed from the eye with the irrigation-aspiration handpiece. The wounds were hydrated. The anterior chamber was flushed and the eye was inflated to physiologic pressure. 0.57ml Vigamox was placed in the anterior chamber. The wounds were found to be water tight. The eye was dressed with Vigamox. The patient was given protective glasses to wear throughout the day and a shield with which to  sleep tonight. The patient was also given drops with which to begin a drop regimen today and will follow-up with me in one day. Implant Name Type Inv. Item Serial No. Manufacturer Lot No. LRB No. Used Action  LENS IOL DIOP 21.0 - [id number] Intraocular Lens LENS IOL DIOP 21.0 [id number]  Left 1 Implanted    Procedure(s) with comments: CATARACT EXTRACTION PHACO AND INTRAOCULAR LENS PLACEMENT (IOC) LEFT VISION BLUE 5.49  00:34.1 (Left) - sleep apnea  Electronically signed: Zeanna Sunde 11/24/2019 10:07 AM

## 2019-11-24 NOTE — Anesthesia Postprocedure Evaluation (Signed)
Anesthesia Post Note  Patient: Regina Campos  Procedure(s) Performed: CATARACT EXTRACTION PHACO AND INTRAOCULAR LENS PLACEMENT (IOC) LEFT VISION BLUE 5.49  00:34.1 (Left Eye)     Patient location during evaluation: PACU Anesthesia Type: MAC Level of consciousness: awake and alert Pain management: pain level controlled Vital Signs Assessment: post-procedure vital signs reviewed and stable Respiratory status: spontaneous breathing, nonlabored ventilation, respiratory function stable and patient connected to nasal cannula oxygen Cardiovascular status: stable and blood pressure returned to baseline Postop Assessment: no apparent nausea or vomiting Anesthetic complications: no    Drystan Reader A  Quantae Martel

## 2019-11-24 NOTE — H&P (Signed)
   I have reviewed the patient's H&P and agree with its findings. There have been no interval changes.  Shamaya Kauer MD Ophthalmology 

## 2019-11-24 NOTE — Anesthesia Procedure Notes (Signed)
Procedure Name: MAC Date/Time: 11/24/2019 9:35 AM Performed by: Vanetta Shawl, CRNA Pre-anesthesia Checklist: Patient identified, Emergency Drugs available, Suction available, Timeout performed and Patient being monitored Patient Re-evaluated:Patient Re-evaluated prior to induction Oxygen Delivery Method: Nasal cannula Placement Confirmation: positive ETCO2

## 2019-11-24 NOTE — Transfer of Care (Addendum)
Immediate Anesthesia Transfer of Care Note  Patient: Regina Campos  Procedure(s) Performed: CATARACT EXTRACTION PHACO AND INTRAOCULAR LENS PLACEMENT (IOC) LEFT VISION BLUE 5.49  00:34.1 (Left Eye)  Patient Location: PACU  Anesthesia Type: MAC  Level of Consciousness: awake, alert  and patient cooperative  Airway and Oxygen Therapy: Patient Spontanous Breathing   Post-op Assessment: Post-op Vital signs reviewed, Patient's Cardiovascular Status Stable, Respiratory Function Stable, Patent Airway and No signs of Nausea or vomiting  Post-op Vital Signs: Reviewed and stable  Complications: No apparent anesthesia complications

## 2019-11-25 ENCOUNTER — Encounter: Payer: Self-pay | Admitting: *Deleted

## 2019-12-01 ENCOUNTER — Ambulatory Visit: Payer: Medicare Other | Admitting: Internal Medicine

## 2019-12-02 ENCOUNTER — Telehealth: Payer: Self-pay

## 2019-12-02 NOTE — Telephone Encounter (Signed)
Confirmed appointment on 12/05/2019 and screened for covid. klh 

## 2019-12-05 ENCOUNTER — Telehealth: Payer: Self-pay

## 2019-12-05 NOTE — Telephone Encounter (Signed)
Confirmed virtual visit on 12/06/2019. klh

## 2019-12-06 ENCOUNTER — Ambulatory Visit: Payer: Medicare Other | Admitting: Internal Medicine

## 2019-12-06 ENCOUNTER — Encounter: Payer: Self-pay | Admitting: Internal Medicine

## 2019-12-13 ENCOUNTER — Telehealth: Payer: Self-pay

## 2019-12-13 NOTE — Telephone Encounter (Signed)
Confirmed appointment on 12/15/2019 and screened for covid. klh

## 2019-12-15 ENCOUNTER — Other Ambulatory Visit: Payer: Self-pay

## 2019-12-15 ENCOUNTER — Ambulatory Visit (INDEPENDENT_AMBULATORY_CARE_PROVIDER_SITE_OTHER): Payer: Medicare Other | Admitting: Internal Medicine

## 2019-12-15 ENCOUNTER — Encounter: Payer: Self-pay | Admitting: Internal Medicine

## 2019-12-15 VITALS — BP 134/65 | HR 78 | Temp 96.1°F | Resp 16 | Ht 65.0 in | Wt 240.0 lb

## 2019-12-15 DIAGNOSIS — J301 Allergic rhinitis due to pollen: Secondary | ICD-10-CM | POA: Diagnosis not present

## 2019-12-15 DIAGNOSIS — R0602 Shortness of breath: Secondary | ICD-10-CM | POA: Diagnosis not present

## 2019-12-15 DIAGNOSIS — J4489 Other specified chronic obstructive pulmonary disease: Secondary | ICD-10-CM

## 2019-12-15 DIAGNOSIS — G4733 Obstructive sleep apnea (adult) (pediatric): Secondary | ICD-10-CM

## 2019-12-15 DIAGNOSIS — Z9989 Dependence on other enabling machines and devices: Secondary | ICD-10-CM

## 2019-12-15 DIAGNOSIS — J449 Chronic obstructive pulmonary disease, unspecified: Secondary | ICD-10-CM

## 2019-12-15 DIAGNOSIS — R0609 Other forms of dyspnea: Secondary | ICD-10-CM

## 2019-12-15 DIAGNOSIS — R0600 Dyspnea, unspecified: Secondary | ICD-10-CM

## 2019-12-15 DIAGNOSIS — Z6839 Body mass index (BMI) 39.0-39.9, adult: Secondary | ICD-10-CM
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
## 2020-01-06 ENCOUNTER — Telehealth: Payer: Self-pay

## 2020-01-06 ENCOUNTER — Other Ambulatory Visit: Payer: Medicare Other

## 2020-01-06 NOTE — Telephone Encounter (Signed)
Patient rescheduled appointment on 01/06/2020 to 01/20/2020. klh

## 2020-01-11 ENCOUNTER — Other Ambulatory Visit: Payer: Self-pay

## 2020-01-11 ENCOUNTER — Ambulatory Visit (INDEPENDENT_AMBULATORY_CARE_PROVIDER_SITE_OTHER): Payer: Medicare Other | Admitting: Internal Medicine

## 2020-01-11 DIAGNOSIS — R0602 Shortness of breath: Secondary | ICD-10-CM | POA: Diagnosis not present

## 2020-01-11 LAB — PULMONARY FUNCTION TEST

## 2020-01-20 ENCOUNTER — Other Ambulatory Visit: Payer: Self-pay

## 2020-01-20 ENCOUNTER — Ambulatory Visit: Payer: Medicare Other

## 2020-01-20 DIAGNOSIS — R0609 Other forms of dyspnea: Secondary | ICD-10-CM

## 2020-01-20 DIAGNOSIS — R0602 Shortness of breath: Secondary | ICD-10-CM | POA: Diagnosis not present

## 2020-01-22 NOTE — Procedures (Signed)
[place], [id number]  DATE OF SERVICE: Jan 11, 2020  Complete Pulmonary Function Testing Interpretation:  FINDINGS:  Forced vital capacity is normal FEV1 is normal FEV1 FVC ratio is normal.  Postbronchodilator there is no significant change in FEV1 clinical improvement may still occur in the absence of spirometric improvement.  Total lung capacity is normal residual volume is decreased residual internal capacity ratio is decreased FRC is decreased.  DLCO is mildly decreased but normal when corrected for alveolar volume.  IMPRESSION:  This pulmonary function study is within normal limits.  There does not appear to be significant response to bronchodilators clinical improvement may still occur in the absence of spirometric improvement  Allyne Gee, MD [facility] Pulmonary Critical Care Medicine Sleep Medicine

## 2020-01-23 ENCOUNTER — Ambulatory Visit (INDEPENDENT_AMBULATORY_CARE_PROVIDER_SITE_OTHER): Payer: Medicare Other | Admitting: Internal Medicine

## 2020-01-23 ENCOUNTER — Other Ambulatory Visit: Payer: Self-pay

## 2020-01-23 ENCOUNTER — Encounter: Payer: Self-pay | Admitting: Internal Medicine

## 2020-01-23 VITALS — BP 134/61 | HR 67 | Temp 97.4°F | Resp 16 | Ht 65.0 in | Wt 244.8 lb

## 2020-01-23 DIAGNOSIS — J449 Chronic obstructive pulmonary disease, unspecified: Secondary | ICD-10-CM | POA: Diagnosis not present

## 2020-01-23 DIAGNOSIS — Z9989 Dependence on other enabling machines and devices: Secondary | ICD-10-CM

## 2020-01-23 DIAGNOSIS — G4733 Obstructive sleep apnea (adult) (pediatric): Secondary | ICD-10-CM

## 2020-01-23 DIAGNOSIS — R400 Somnolence: Secondary | ICD-10-CM | POA: Diagnosis not present

## 2020-01-23 DIAGNOSIS — Z6841 Body Mass Index (BMI) 40.0 and over, adult: Secondary | ICD-10-CM | POA: Diagnosis not present
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
## 2020-01-29 NOTE — Progress Notes (Signed)
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 detected.  Sensory (Neurological): Unimpaired        Sensory (Neurological): Unimpaired        DTR: Patellar: deferred today Achilles: deferred today Plantar: deferred today  DTR: Patellar: deferred today Achilles: deferred today Plantar: deferred today  Palpation: No palpable anomalies  Palpation: No palpable anomalies   Assessment   Status Diagnosis  Worsening Worsened Worsened 1. Chronic pain syndrome   2. Chronic rib pain (1ry area of Pain) (Left)  3. Chronic intercostal pain (Left)   4. Radicular pain of shoulder (Left)   5. Closed fracture of cervical vertebra, sequela   6. Chronic shoulder pain (Third area of Pain) (Bilateral) (L>R)   7. Chronic foot pain (Right)   8. Chronic foot pain (Left)   9. Pharmacologic therapy   10. Disorder of skeletal system   11. Problems  influencing health status   12. Chronic low back pain (Bilateral) w/o sciatica      Updated Problems: Problem  Radicular pain of shoulder (Left)  Chronic intercostal pain (Left)  Chronic low back pain (Bilateral) w/o sciatica  Cervicalgia   Trauma on 5/25 for multiple injuries including left transverse process fractures C5-T1.   Chronic rib pain (1ry area of Pain) (Left)   Trauma on 5/25 for multiple injuries including  posterior left 1st-8th rib fractures, and lateral left 4th-5th rib fractures.   Laceration of Left Arm With Complication   Added automatically from request for surgery [id number]  Formatting of this note might be different from the original. Added automatically from request for surgery [id number]   Neuropathic Pain   Added automatically from request for surgery [id number]  Formatting of this note might be different from the original. 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   Chronic low back pain (Bilateral) (R>L) w/ sciatica (Bilateral)  Hypertrophic Scar   Added automatically from request for surgery [id number]  Formatting of this note might be different from the original. Added automatically from request for surgery [id number]   Pruritus of Skin   Added automatically from request for surgery [id number]  Formatting of this note might be different from the original. Added automatically from request for surgery [id number]   Scar Condition and Fibrosis of Skin   Added automatically from request for surgery [id number]  Formatting of this note might be different from the original. Added automatically from request for surgery [id number]     Plan of Care  Pharmacotherapy (Medications Ordered): No orders of the defined types were placed in this encounter.  Medications administered today: Donne Anon. Ripp  had no medications administered during this visit.  Orders:  Orders Placed This Encounter  Procedures  . INTERCOSTAL NERVE BLOCK    Standing Status:   Future    Standing Expiration Date:   07/31/2020    Scheduling Instructions:     Side: Left-sided     Sedation: With Sedation.     Timeframe: ASAA    Order Specific Question:   Where will this procedure be performed?    Answer:   [facility]  . DG Cervical Spine With Flex & Extend    Patient presents with axial pain with possible radicular component.  In addition to any acute findings, please report on:  1. Facet (Zygapophyseal) joint DJD (Hypertrophy, space narrowing, subchondral sclerosis, and/or osteophyte formation) 2. DDD and/or IVDD (Loss of disc height, desiccation or "Black disc disease") 3. Pars defects 4. Spondylolisthesis, spondylosis, and/or spondyloarthropathies (include Degree/Grade of displacement in mm) 5. Vertebral body Fractures, including age (old, new/acute) 65. Modic Type Changes 7. Demineralization 8. Bone pathology 9. Central, Lateral Recess, and/or Foraminal Stenosis (include AP diameter of stenosis in mm) 10. Surgical changes (hardware type, status, and presence of fibrosis) NOTE: Please specify level(s) and laterality. If applicable: Please indicate ROM and/or evidence of instability (>  59m displacement between flexion and extension views)    Standing Status:   Future    Standing Expiration Date:   05/01/2020    Scheduling Instructions:     Please evaluate for any evidence of cervical spine instability. Describe the presence of any spondylolisthesis (Antero- or retrolisthesis). If present, provide displacement "Grade" and measurement in cm. Please describe presence and specific location (Level & Laterality) of any signs of      osteoarthritis, zygapophyseal (Facet) joints DJD (including decreased joint space and/or osteophytosis), DDD, Foraminal narrowing, as well as any sclerosis and/or cyst formation.  Please comment on ROM.    Order Specific Question:   Reason for Exam (SYMPTOM  OR DIAGNOSIS REQUIRED)    Answer:   Cervicalgia    Order Specific Question:   Preferred imaging location?    Answer:   [facility]    Order Specific Question:   Call Results- Best Contact Number?    Answer:   [phone]    Order Specific Question:   Radiology Contrast Protocol - do NOT remove file path    Answer:   \\[email or web address]\[email or web address]\Radiant\DXFluoroContrastProtocols.pdf  . 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    Answer:   [facility]    Order Specific Question:   Radiology Contrast Protocol - do NOT remove file path    Answer:   \\[email or web address]\Radiant\DXFluoroContrastProtocols.pdf  . 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    Answer:   [facility]    Order Specific Question:   Radiology Contrast Protocol - do NOT remove file path    Answer:   \\[email or web address]\epicdata\Radiant\DXFluoroContrastProtocols.pdf  . DG Lumbar Spine Complete    Patient presents with axial pain with possible radicular component.  In addition to any acute findings, please report on:  1. Facet (Zygapophyseal) joint DJD (Hypertrophy, space narrowing, subchondral sclerosis, and/or osteophyte formation) 2. DDD and/or IVDD (Loss of disc height, desiccation or "Black disc disease") 3. Pars defects 4. Spondylolisthesis, spondylosis, and/or spondyloarthropathies (include Degree/Grade of displacement in  mm) 5. Vertebral body Fractures, including age (old, new/acute) 679 Modic Type Changes 7. Demineralization 8. Bone pathology 9. Central, Lateral Recess, and/or Foraminal Stenosis (include AP diameter of stenosis in mm) 10. Surgical changes (hardware type, status, and presence of fibrosis) NOTE: Please specify level(s) and laterality.    Standing Status:   Future    Standing Expiration Date:   05/01/2020    Order Specific Question:   Reason for Exam (SYMPTOM  OR DIAGNOSIS REQUIRED)    Answer:   Back Pain    Order Specific Question:   Preferred imaging location?    Answer:    [facility]    Order Specific Question:   Call Results- Best Contact Number?    Answer:   [facility] [phone]   Order Specific Question:   Radiology Contrast Protocol - do NOT remove file path  Answer:   \\[email or web address]\epicdata\Radiant\DXFluoroContrastProtocols.pdf  . DG Shoulder Right    Standing Status:   Future    Standing Expiration Date:   03/31/2020    Order Specific Question:   Reason for Exam (SYMPTOM  OR DIAGNOSIS REQUIRED)    Answer:   Right shoulder pain    Order Specific Question:   Preferred imaging location?    Answer:   [facility]    Order Specific Question:   Call Results- Best Contact Number?    Answer:   [phone]  . DG Shoulder Left    Standing Status:   Future    Standing Expiration Date:   03/31/2020    Order Specific Question:   Reason for Exam (SYMPTOM  OR DIAGNOSIS REQUIRED)    Answer:   Left shoulder pain    Order Specific Question:   Preferred imaging location?    Answer:   [facility]    Order Specific Question:   Call Results- Best Contact Number?    Answer:   [phone]  . DG Ribs Unilateral Left    Standing Status:   Future    Standing Expiration Date:   02/29/2020    Order Specific Question:   Reason for Exam (SYMPTOM  OR DIAGNOSIS REQUIRED)    Answer:   Left intercostal pain s/p trauma    Order Specific Question:   Preferred imaging location?     Answer:   [facility]    Order Specific Question:   Radiology Contrast Protocol - do NOT remove file path    Answer:   \\charchive\epicdata\Radiant\DXFluoroContrastProtocols.pdf  . ToxASSURE Select 13 (MW), Urine    Volume: 30 ml(s). Minimum 3 ml of urine is needed. Document temperature of fresh sample. Indications: Long term (current) use of opiate analgesic (X64.680)    Order Specific Question:   Release to patient    Answer:   Immediate  . Comp. Metabolic Panel (12)    With GFR. Indications: Chronic Pain Syndrome (G89.4) & Pharmacotherapy (H21.224)    Order Specific Question:   Has the patient fasted?    Answer:   No    Order Specific Question:   CC Results    Answer:   [id number] [[id number]]    Order Specific Question:   Release to patient    Answer:   Immediate  . Magnesium    Indication: Pharmacologic therapy (Z79.899)    Order Specific Question:   CC Results    Answer:   PCP-NURSE [[id number]]    Order Specific Question:   Release to patient    Answer:   Immediate  . Vitamin B12    Indication: Pharmacologic therapy (U88.280).    Order Specific Question:   CC Results    Answer:   [id number] [[id number]]    Order Specific Question:   Release to patient    Answer:   Immediate  . Sedimentation rate    Indication: Disorder of skeletal system (M89.9)    Order Specific Question:   CC Results    Answer:   PCP-NURSE [[id number]]    Order Specific Question:   Release to patient    Answer:   Immediate  . 25-Hydroxy vitamin D Lcms D2+D3    Indication: Disorder of skeletal system (M89.9).    Order Specific Question:   CC Results    Answer:   [id number] [[id number]]    Order Specific Question:   Release to patient    Answer:   Immediate  . C-reactive protein    Indication: Problems influencing  health status (Z78.9)    Order Specific Question:   CC Results    Answer:   PCP-NURSE [[id number]]    Order Specific Question:   Release to patient    Answer:   Immediate    Lab Orders     ToxASSURE  Select 13 (MW), Urine     Comp. Metabolic Panel (12)     Magnesium     Vitamin B12     Sedimentation rate     25-Hydroxy vitamin D Lcms D2+D3     C-reactive protein  Imaging Orders     DG Cervical Spine With Flex & Extend     DG Foot Complete Left     DG Foot Complete Right     DG Lumbar Spine Complete     DG Shoulder Right     DG Shoulder Left     DG Ribs Unilateral Left Referral Orders  No referral(s) requested today   Planned follow-up:   Return for Procedure (w/ sedation): (L) Intercostal NB, (ASAP).      Interventional management options:  Considering:   Possiblebilateral suprascapular RFA  Bilateral intra-articular Hip injection  Diagnostic bilateral cervical facet block  Possible bilateral cervical facet RFA  Diagnostic bilateral lumbar facet block  Possible bilateral lumbar facet RFA  Diagnostic bilateral dorsolateral junction of Cuboid and 5th PMC, foot steroid injection #2  Diagnostic right dorsolateral Tarsometatarsal joint 4, foot steroid injection #2    Palliative PRN treatment(s):   Palliative right L3-4 LESI #3  Palliative right L4 TFESI #3  Palliative right L2-3 LESI #2  Palliative right C7-T1 CESI #2  Palliativeleft IA shoulder joint injection #2  Palliativeright suprascapular NB #2  Palliativeleft suprascapular NB #3  Palliative bilateral IA Hip injection #2  Therapeutic right finger #4 (ring) A-1 pulley area, trigger finger tendon sheath injection #2     Recent Visits No visits were found meeting these conditions. Showing recent visits within past 90 days and meeting all other requirements Today's Visits Date Type Provider Dept  01/30/20 Office Visit Milinda Pointer, MD [facility]  Showing today's visits and meeting all other requirements Future Appointments Date Type Provider Dept  02/07/20 Appointment Milinda Pointer, MD [facility]  04/25/20 Appointment Milinda Pointer, MD [facility]  Showing  future appointments within next 90 days and meeting all other requirements  Primary Care Physician: Lenard Simmer, MD Location: [facility] Note by: Gaspar Cola, MD Date: 01/30/2020; Time: 11:55 AM  Note: This dictation was prepared with Dragon dictation. Any transcriptional errors that may result from this process are unintentional.

## 2020-01-30 ENCOUNTER — Other Ambulatory Visit
Admission: RE | Admit: 2020-01-30 | Discharge: 2020-01-30 | Disposition: A | Discharge status: Home / Self Care | Payer: Medicare Other | Source: Home / Self Care | Attending: Pain Medicine | Admitting: Pain Medicine

## 2020-01-30 ENCOUNTER — Encounter: Payer: Self-pay | Admitting: Pain Medicine

## 2020-01-30 ENCOUNTER — Other Ambulatory Visit: Payer: Self-pay

## 2020-01-30 ENCOUNTER — Ambulatory Visit (HOSPITAL_BASED_OUTPATIENT_CLINIC_OR_DEPARTMENT_OTHER): Payer: Medicare Other | Admitting: Pain Medicine

## 2020-01-30 ENCOUNTER — Ambulatory Visit
Admission: RE | Admit: 2020-01-30 | Discharge: 2020-01-30 | Disposition: A | Discharge status: Home / Self Care | Payer: Medicare Other | Source: Ambulatory Visit | Attending: Pain Medicine | Admitting: Pain Medicine

## 2020-01-30 VITALS — BP 143/92 | HR 71 | Temp 97.5°F | Resp 18 | Ht 64.0 in | Wt 242.0 lb

## 2020-01-30 DIAGNOSIS — M25512 Pain in left shoulder: Secondary | ICD-10-CM

## 2020-01-30 DIAGNOSIS — M5412 Radiculopathy, cervical region: Secondary | ICD-10-CM

## 2020-01-30 DIAGNOSIS — S129XXS Fracture of neck, unspecified, sequela: Secondary | ICD-10-CM

## 2020-01-30 DIAGNOSIS — M545 Low back pain, unspecified: Secondary | ICD-10-CM | POA: Insufficient documentation

## 2020-01-30 DIAGNOSIS — G894 Chronic pain syndrome: Secondary | ICD-10-CM | POA: Diagnosis not present

## 2020-01-30 DIAGNOSIS — R0782 Intercostal pain: Secondary | ICD-10-CM

## 2020-01-30 DIAGNOSIS — R0781 Pleurodynia: Secondary | ICD-10-CM | POA: Insufficient documentation

## 2020-01-30 DIAGNOSIS — M79671 Pain in right foot: Secondary | ICD-10-CM | POA: Insufficient documentation

## 2020-01-30 DIAGNOSIS — M79672 Pain in left foot: Secondary | ICD-10-CM | POA: Insufficient documentation

## 2020-01-30 DIAGNOSIS — Z789 Other specified health status: Secondary | ICD-10-CM

## 2020-01-30 DIAGNOSIS — G8929 Other chronic pain: Secondary | ICD-10-CM

## 2020-01-30 DIAGNOSIS — M25511 Pain in right shoulder: Secondary | ICD-10-CM

## 2020-01-30 DIAGNOSIS — M899 Disorder of bone, unspecified: Secondary | ICD-10-CM

## 2020-01-30 DIAGNOSIS — Z79899 Other long term (current) drug therapy: Secondary | ICD-10-CM | POA: Insufficient documentation

## 2020-01-30 LAB — COMPREHENSIVE METABOLIC PANEL
ALT: 25 U/L (ref 0–44)
AST: 29 U/L (ref 15–41)
Albumin: 3.7 g/dL (ref 3.5–5.0)
Alkaline Phosphatase: 77 U/L (ref 38–126)
Anion gap: 8 (ref 5–15)
BUN: 8 mg/dL (ref 8–23)
CO2: 24 mmol/L (ref 22–32)
Calcium: 8.9 mg/dL (ref 8.9–10.3)
Chloride: 108 mmol/L (ref 98–111)
Creatinine, Ser: 0.83 mg/dL (ref 0.44–1.00)
GFR calc Af Amer: >60 mL/min (ref >60)
GFR calc non Af Amer: >60 mL/min (ref >60)
Glucose, Bld: 95 mg/dL (ref 70–99)
Potassium: 4.1 mmol/L (ref 3.5–5.1)
Sodium: 140 mmol/L (ref 135–145)
Total Bilirubin: 0.4 mg/dL (ref 0.3–1.2)
Total Protein: 6.6 g/dL (ref 6.5–8.1)

## 2020-01-30 LAB — MAGNESIUM: Magnesium: 2 mg/dL (ref 1.7–2.4)

## 2020-01-30 LAB — SEDIMENTATION RATE: Sed Rate: 21 mm/hr (ref 0–30)

## 2020-01-30 LAB — C-REACTIVE PROTEIN: CRP: 0.7 mg/dL (ref <1.0)

## 2020-01-30 LAB — VITAMIN B12: Vitamin B-12: 504 pg/mL (ref 180–914)

## 2020-01-30 NOTE — Progress Notes (Signed)
Nursing Pain Medication Assessment:  Safety precautions to be maintained throughout the outpatient stay will include: orient to surroundings, keep bed in low position, maintain call bell within reach at all times, provide assistance with transfer out of bed and ambulation.  Medication Inspection Compliance: Regina Campos did not comply with our request to bring her pills to be counted. She was reminded that bringing the medication bottles, even when empty, is a requirement.  Medication: None brought in. Pill/Patch Count: None available to be counted. Bottle Appearance: No container available. Did not bring bottle(s) to appointment. Filled Date: N/A Last Medication intake:  Today  Reminded to bring pills to appointments

## 2020-01-30 NOTE — Patient Instructions (Addendum)
____________________________________________________________________________________________  General Risks and Possible Complications  Patient Responsibilities: It is important that you read this as it is part of your informed consent. It is our duty to inform you of the risks and possible complications associated with treatments offered to you. It is your responsibility as a patient to read this and to ask questions about anything that is not clear or that you believe was not covered in this document.  Patient's Rights: You have the right to refuse treatment. You also have the right to change your mind, even after initially having agreed to have the treatment done. However, under this last option, if you wait until the last second to change your mind, you may be charged for the materials used up to that point.  Introduction: Medicine is not an exact science. Everything in Medicine, including the lack of treatment(s), carries the potential for danger, harm, or loss (which is by definition: Risk). In Medicine, a complication is a secondary problem, condition, or disease that can aggravate an already existing one. All treatments carry the risk of possible complications. The fact that a side effects or complications occurs, does not imply that the treatment was conducted incorrectly. It must be clearly understood that these can happen even when everything is done following the highest safety standards.  No treatment: You can choose not to proceed with the proposed treatment alternative. The "PRO(s)" would include: avoiding the risk of complications associated with the therapy. The "CON(s)" would include: not getting any of the treatment benefits. These benefits fall under one of three categories: diagnostic; therapeutic; and/or palliative. Diagnostic benefits include: getting information which can ultimately lead to improvement of the disease or symptom(s). Therapeutic benefits are those associated with the  successful treatment of the disease. Finally, palliative benefits are those related to the decrease of the primary symptoms, without necessarily curing the condition (example: decreasing the pain from a flare-up of a chronic condition, such as incurable terminal cancer).  General Risks and Complications: These are associated to most interventional treatments. They can occur alone, or in combination. They fall under one of the following six (6) categories: no benefit or worsening of symptoms; bleeding; infection; nerve damage; allergic reactions; and/or death. 1. No benefits or worsening of symptoms: In Medicine there are no guarantees, only probabilities. No healthcare provider can ever guarantee that a medical treatment will work, they can only state the probability that it may. Furthermore, there is always the possibility that the condition may worsen, either directly, or indirectly, as a consequence of the treatment. 2. Bleeding: This is more common if the patient is taking a blood thinner, either prescription or over the counter (example: Goody Powders, Fish oil, Aspirin, Garlic, etc.), or if suffering a condition associated with impaired coagulation (example: Hemophilia, cirrhosis of the liver, low platelet counts, etc.). However, even if you do not have one on these, it can still happen. If you have any of these conditions, or take one of these drugs, make sure to notify your treating physician. 3. Infection: This is more common in patients with a compromised immune system, either due to disease (example: diabetes, cancer, human immunodeficiency virus [HIV], etc.), or due to medications or treatments (example: therapies used to treat cancer and rheumatological diseases). However, even if you do not have one on these, it can still happen. If you have any of these conditions, or take one of these drugs, make sure to notify your treating physician. 4. Nerve Damage: This is more common when the   treatment is  an invasive one, but it can also happen with the use of medications, such as those used in the treatment of cancer. The damage can occur to small secondary nerves, or to large primary ones, such as those in the spinal cord and brain. This damage may be temporary or permanent and it may lead to impairments that can range from temporary numbness to permanent paralysis and/or brain death. 5. Allergic Reactions: Any time a substance or material comes in contact with our body, there is the possibility of an allergic reaction. These can range from a mild skin rash (contact dermatitis) to a severe systemic reaction (anaphylactic reaction), which can result in death. 6. Death: In general, any medical intervention can result in death, most of the time due to an unforeseen complication. ____________________________________________________________________________________________  ____________________________________________________________________________________________  Preparing for Procedure with Sedation  Procedure appointments are limited to planned procedures: . No Prescription Refills. . No disability issues will be discussed. . No medication changes will be discussed.  Instructions: . Oral Intake: Do not eat or drink anything for at least 8 hours prior to your procedure. (Exception: Blood Pressure Medication. See below.) . Transportation: Unless otherwise stated by your physician, you may drive yourself after the procedure. . Blood Pressure Medicine: Do not forget to take your blood pressure medicine with a sip of water the morning of the procedure. If your Diastolic (lower reading)is above 100 mmHg, elective cases will be cancelled/rescheduled. . Blood thinners: These will need to be stopped for procedures. Notify our staff if you are taking any blood thinners. Depending on which one you take, there will be specific instructions on how and when to stop it. . Diabetics on insulin: Notify the staff  so that you can be scheduled 1st case in the morning. If your diabetes requires high dose insulin, take only  of your normal insulin dose the morning of the procedure and notify the staff that you have done so. . Preventing infections: Shower with an antibacterial soap the morning of your procedure. . Build-up your immune system: Take 1000 mg of Vitamin C with every meal (3 times a day) the day prior to your procedure. . Antibiotics: Inform the staff if you have a condition or reason that requires you to take antibiotics before dental procedures. . Pregnancy: If you are pregnant, call and cancel the procedure. . Sickness: If you have a cold, fever, or any active infections, call and cancel the procedure. . Arrival: You must be in the facility at least 30 minutes prior to your scheduled procedure. . Children: Do not bring children with you. . Dress appropriately: Bring dark clothing that you would not mind if they get stained. . Valuables: Do not bring any jewelry or valuables.  Reasons to call and reschedule or cancel your procedure: (Following these recommendations will minimize the risk of a serious complication.) . Surgeries: Avoid having procedures within 2 weeks of any surgery. (Avoid for 2 weeks before or after any surgery). . Flu Shots: Avoid having procedures within 2 weeks of a flu shots or . (Avoid for 2 weeks before or after immunizations). . Barium: Avoid having a procedure within 7-10 days after having had a radiological study involving the use of radiological contrast. (Myelograms, Barium swallow or enema study). . Heart attacks: Avoid any elective procedures or surgeries for the initial 6 months after a "Myocardial Infarction" (Heart Attack). . Blood thinners: It is imperative that you stop these medications before procedures. Let us know if you   if you take any blood thinner.  . Infection: Avoid procedures during or within two weeks of an infection (including chest colds or  gastrointestinal problems). Symptoms associated with infections include: Localized redness, fever, chills, night sweats or profuse sweating, burning sensation when voiding, cough, congestion, stuffiness, runny nose, sore throat, diarrhea, nausea, vomiting, cold or Flu symptoms, recent or current infections. It is specially important if the infection is over the area that we intend to treat. . Heart and lung problems: Symptoms that may suggest an active cardiopulmonary problem include: cough, chest pain, breathing difficulties or shortness of breath, dizziness, ankle swelling, uncontrolled high or unusually low blood pressure, and/or palpitations. If you are experiencing any of these symptoms, cancel your procedure and contact your primary care physician for an evaluation.  Remember:  Regular Business hours are:  Monday to Thursday 8:00 AM to 4:00 PM  Provider's Schedule: Halston Fairclough, MD:  Procedure days: Tuesday and Thursday 7:30 AM to 4:00 PM  Bilal Lateef, MD:  Procedure days: Monday and Wednesday 7:30 AM to 4:00 PM ____________________________________________________________________________________________    

## 2020-02-02 LAB — 25-HYDROXY VITAMIN D LCMS D2+D3
25-Hydroxy, Vitamin D-2: 30 ng/mL
25-Hydroxy, Vitamin D-3: 81 ng/mL
25-Hydroxy, Vitamin D: 111 ng/mL — ABNORMAL HIGH

## 2020-02-07 ENCOUNTER — Other Ambulatory Visit: Payer: Self-pay

## 2020-02-07 ENCOUNTER — Ambulatory Visit
Admission: RE | Admit: 2020-02-07 | Discharge: 2020-02-07 | Disposition: A | Discharge status: Home / Self Care | Payer: Medicare Other | Source: Ambulatory Visit | Attending: Pain Medicine | Admitting: Pain Medicine

## 2020-02-07 ENCOUNTER — Ambulatory Visit (HOSPITAL_BASED_OUTPATIENT_CLINIC_OR_DEPARTMENT_OTHER): Payer: Medicare Other | Admitting: Pain Medicine

## 2020-02-07 ENCOUNTER — Encounter: Payer: Self-pay | Admitting: Pain Medicine

## 2020-02-07 VITALS — BP 129/63 | HR 72 | Temp 97.4°F | Resp 18 | Ht 64.0 in | Wt 242.0 lb

## 2020-02-07 DIAGNOSIS — J948 Other specified pleural conditions: Secondary | ICD-10-CM

## 2020-02-07 DIAGNOSIS — R0781 Pleurodynia: Secondary | ICD-10-CM | POA: Diagnosis not present

## 2020-02-07 DIAGNOSIS — R0782 Intercostal pain: Secondary | ICD-10-CM | POA: Insufficient documentation

## 2020-02-07 DIAGNOSIS — S2242XS Multiple fractures of ribs, left side, sequela: Secondary | ICD-10-CM | POA: Diagnosis present

## 2020-02-07 MED ORDER — MIDAZOLAM HCL 5 MG/5ML IJ SOLN: 1.0000 mg | INTRAMUSCULAR | Status: DC | PRN

## 2020-02-07 MED ORDER — LACTATED RINGERS IV SOLN: 1000.0000 mL | Freq: Once | INTRAVENOUS | Status: DC

## 2020-02-07 MED ORDER — ROPIVACAINE HCL 2 MG/ML IJ SOLN
9.0000 mL | Freq: Once | INTRAMUSCULAR | Status: AC
  Administered 2020-02-07: 9 mL via PERINEURAL
  Filled 2020-02-07: qty 10

## 2020-02-07 MED ORDER — LIDOCAINE HCL 2 % IJ SOLN
20.0000 mL | Freq: Once | INTRAMUSCULAR | Status: AC
  Administered 2020-02-07: 400 mg
  Filled 2020-02-07: qty 40

## 2020-02-07 MED ORDER — FENTANYL CITRATE (PF) 100 MCG/2ML IJ SOLN: 25.0000 ug | INTRAMUSCULAR | Status: DC | PRN

## 2020-02-07 MED ORDER — DEXAMETHASONE SODIUM PHOSPHATE 10 MG/ML IJ SOLN
10.0000 mg | Freq: Once | INTRAMUSCULAR | Status: AC
  Administered 2020-02-07: 10 mg
  Filled 2020-02-07: qty 1

## 2020-02-07 NOTE — Progress Notes (Signed)
PROVIDER NOTE: Information contained herein reflects review and annotations entered in association with encounter. Interpretation of such information and data should be left to medically-trained personnel. Information provided to patient can be located elsewhere in the medical record under "Patient Instructions". Document created using STT-dictation technology, any transcriptional errors that may result from process are unintentional.    Patient: Regina Campos  Service Category: Procedure  Provider: Gaspar Cola, MD  DOB: 09/22/1950  DOS: 02/07/2020  Location: [facility]  MRN: [id number]  Setting: Ambulatory - outpatient  Referring Provider: Lenard Simmer, MD  Type: Established Patient  Specialty: Interventional Pain Management  PCP: Lenard Simmer, MD   Primary Reason for Visit: Interventional Pain Management Treatment. CC: Shoulder Pain (bilateral)  Procedure:          Anesthesia, Analgesia, Anxiolysis:  Type: Diagnostic Mid-Axillary Line Intercostal  Nerve Block  #1  Region: Mid-Axillary Line  Thoracic Area Level: 5-8 Ribs Laterality: Left-side  Type: Local Anesthesia Indication(s): Analgesia         Route: Infiltration ([place]/IM) IV Access: Declined Sedation: Declined  Local Anesthetic: Lidocaine 1-2%  Position: Lateral decubitus, tilted anteriorly (approx. 45 debrees), w/ the painful side up   Indications: 1. Chronic rib pain (1ry area of Pain) (Left)   2. Traumatic closed nondisplaced fracture of multiple ribs, left, sequela   3. Chronic intercostal pain (Left)    Pain Score: Pre-procedure: 6 /10 Post-procedure: 3 /10   Laboratory Chemistry Profile   Renal Lab Results  Component Value Date   BUN 8 01/30/2020   CREATININE 0.83 01/30/2020   GFRAA >60 01/30/2020   GFRNONAA >60 01/30/2020     Electrolytes Lab Results  Component Value Date   NA 140 01/30/2020   K 4.1 01/30/2020   CL 108 01/30/2020   CALCIUM 8.9 01/30/2020   MG 2.0  01/30/2020     Hepatic Lab Results  Component Value Date   AST 29 01/30/2020   ALT 25 01/30/2020   ALBUMIN 3.7 01/30/2020   ALKPHOS 77 01/30/2020     ID Lab Results  Component Value Date   SARSCOV2NAA NEGATIVE 11/22/2019     Bone Lab Results  Component Value Date   25OHVITD1 111 (H) 01/30/2020   25OHVITD2 30 01/30/2020   25OHVITD3 81 01/30/2020     Endocrine Lab Results  Component Value Date   GLUCOSE 95 01/30/2020     Neuropathy Lab Results  Component Value Date   FBXUXYBF38 329 01/30/2020     CNS No results found.   Inflammation (CRP: Acute  ESR: Chronic) Lab Results  Component Value Date   CRP 0.7 01/30/2020   ESRSEDRATE 21 01/30/2020     Rheumatology No results found.   Coagulation No results found.   Cardiovascular No results found.   Screening Lab Results  Component Value Date   [place] NEGATIVE 11/22/2019     Cancer No results found.   Allergens No results found.     Note: Lab results reviewed.  Imaging Review  Cervical Imaging: MR CERVICAL SPINE WO CONTRAST  Narrative CLINICAL DATA:  Neck pain for 1 year.  Bilateral hand weakness.  EXAM: MRI CERVICAL SPINE WITHOUT CONTRAST  TECHNIQUE: Multiplanar, multisequence MR imaging of the cervical spine was performed. No intravenous contrast was administered.  COMPARISON:  Cervical spine MRI 10/15/2012  FINDINGS: Alignment: Physiologic.  Vertebrae: There is C4-C7 ACDF.  No focal marrow abnormality.  Cord: Normal signal and morphology.  Posterior Fossa, vertebral arteries, paraspinal tissues: Visualized posterior  fossa is normal. Vertebral artery flow voids are preserved. Normal visualized paraspinal soft tissues.  Disc levels:  C1-C2: Normal.  C2-C3: Normal disc space and facets. No spinal canal or neuroforaminal stenosis.  C3-C4: Bilateral mild uncovertebral hypertrophy with mild right and moderate left neural foraminal stenosis.  C4-C5: Postfusion changes.  No  stenosis.  C5-C6: Postfusion changes. Left uncovertebral spurring with mild left foraminal stenosis.  C6-C7: Postfusion changes. Small central disc osteophyte complex without spinal canal stenosis. Mild bilateral uncovertebral spurring with mild narrowing of the left neural foramen.  C7-T1: Normal disc space and facets. No spinal canal or neuroforaminal stenosis.  IMPRESSION: 1. C4-C7 ACDF without spinal canal stenosis. 2. Mild right and moderate left foraminal stenosis at C3-C4. 3. Mild left foraminal stenosis at C5-C6 and C6-C7 secondary to uncovertebral spurring.   Electronically Signed By: Ulyses Jarred M.D. On: 03/17/2017 14:39  DG Cervical Spine With Flex & Extend  Narrative CLINICAL DATA:  Neck pain  EXAM: CERVICAL SPINE COMPLETE WITH FLEXION AND EXTENSION VIEWS  COMPARISON:  MRI 03/17/2017, radiograph 11/04/2013  FINDINGS: Dens and lateral masses are within normal limits. Straightening of the cervical spine. Anterior plate, fixating screws and interbody devices C4 through C7. No significant change in alignment with flexion or extension. The vertebral body heights are grossly maintained. Degenerative changes at C2-C3 and C3-C4. diffuse bilateral foraminal narrowing at multiple levels.  IMPRESSION: Postsurgical changes C4 through C7 without acute osseous abnormality. Degenerative changes elsewhere in the spine. No definite change in alignment with flexion or extension.   Electronically Signed By: Donavan Foil M.D. On: 01/30/2020 21:30  Shoulder Imaging: DG Shoulder Right  Narrative CLINICAL DATA:  Right shoulder pain  EXAM: RIGHT SHOULDER - 2+ VIEW  COMPARISON:  MRI 01/21/2014  FINDINGS: Tapered appearance of the distal right clavicle with widened AC joint presumably due to prior clavicle resection. No fracture identified. Previous reverse right shoulder replacement. The components appear normally aligned on the Y-view. There is  slight anterior positioning of the humeral component with respect to the glenoid component on the axillary view.  IMPRESSION: 1. No acute osseous abnormality. Probable prior resection of the distal right clavicle 2. Status post reversed right shoulder replacement. There is slight anterior positioning of the humeral component with respect to the glenoid component on the axillary view with the glenoid component directed posteriorly.   Electronically Signed By: Donavan Foil M.D. On: 01/30/2020 21:44  DG Shoulder Left  Narrative CLINICAL DATA:  Left shoulder pain  EXAM: LEFT SHOULDER - 2+ VIEW  COMPARISON:  MRI 11/10/2013  FINDINGS: Moderate AC joint degenerative change. No fracture or malalignment. Moderate degenerative change of the glenohumeral joint. Postsurgical changes of the left ribs.  IMPRESSION: 1. Moderate AC joint and glenohumeral degenerative change   Electronically Signed By: Donavan Foil M.D. On: 01/30/2020 21:45  Lumbosacral Imaging: MR Lumbar Spine Wo Contrast  Narrative CLINICAL DATA:  Low back and bilateral upper leg pain for 2 years, worsened over the past year. History of prior lumbar surgery.  EXAM: MRI LUMBAR SPINE WITHOUT CONTRAST  TECHNIQUE: Multiplanar, multisequence MR imaging of the lumbar spine was performed. No intravenous contrast was administered.  COMPARISON:  Plain films lumbar spine 11/04/2013 and MRI lumbar spine 10/15/2012.  FINDINGS: The patient is status post L4-5 fusion as seen on the prior examination. Vertebral body height is maintained. 0.7 cm retrolisthesis L3 on L4 is unchanged since the prior study. Degenerative endplate signal change is seen at L3-4. There is no worrisome marrow lesion.  The conus medullaris is normal in signal and position. Imaged intra-abdominal contents are unremarkable.  T11-12 and T12-L1 are imaged in the sagittal plane only. There is a shallow disc bulge at T11-12 effacing the ventral  thecal sac. T12-L1 is unremarkable. The appearance of these levels is unchanged.  L1-2: There is a shallow down turning central disc protrusion without central or foraminal stenosis, unchanged.  L2-3: Facet degenerative disease and a shallow disc bulge does not appear notably changed. The central canal and foramina appear open.  L3-4: There has been progression of disease at this level. The patient has a disc bulge with superimposed foraminal protrusions bilaterally, larger on the right. Facet arthropathy is noted. There is moderate central canal stenosis. Severe bilateral foraminal narrowing is worse on the right and increased since the prior study.  L4-5: Status post laminectomy and fusion. The central canal and foramina are widely patent.  L5-S1: There is some facet degenerative disease. No disc bulge or protrusion. The central canal and foramina are open. The appearance is unchanged.  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 Foraminal narrowing appears worse on the right. The central canal is mildly narrowed at this level. The appearance of the lumbar spine is otherwise unchanged.  Status post L4-5 discectomy and fusion. The central canal and foramina are widely patent.   Electronically Signed By: Inge Rise M.D. On: 10/04/2015 10:15  DG Lumbar Spine Complete  Addendum 01/30/2020 10:25 PM ADDENDUM REPORT: 01/30/2020 22:22  ADDENDUM: Degenerative osteophytes at L1-L2, L2-L3 and L5-S1 with relatively maintained disc spaces.   Electronically Signed By: Donavan Foil M.D. On: 01/30/2020 22:22  Narrative CLINICAL DATA:  Back pain  EXAM: LUMBAR SPINE - COMPLETE 4+ VIEW  COMPARISON:  MRI 10/04/2015, CT 12/04/2017  FINDINGS: Sagittal alignment is within normal limits. Posterior rod and fixating screws L3 through L5  with interbody device at L3-L4. Diffuse degenerative osteophytes throughout the thoracolumbar spine. Degenerative changes at L5-S1.  IMPRESSION: Postsurgical changes L3 through L5 with multiple level degenerative change elsewhere in the lumbar spine. No acute osseous abnormality.  Electronically Signed: By: Donavan Foil M.D. On: 01/30/2020 21:37  Ankle Imaging: DG Ankle Complete Right  Narrative CLINICAL DATA:  Bilateral foot and ankle pain for 6 weeks, no new injury  EXAM: RIGHT ANKLE - COMPLETE 3+ VIEW  COMPARISON:  None.  FINDINGS: There are degenerative changes in the hindfoot and ankle joint with some loss of joint space and spurring present. Calcaneal degenerative spurs are noted. The ankle joint is relatively normally aligned with no acute abnormality. Probable old healed fracture of the distal right fibula is noted.  IMPRESSION: Degenerative change in the ankle joint and hindfoot. No acute abnormality.   Electronically Signed By: Ivar Drape M.D. On: 11/26/2017 16:07  DG Ankle Complete Left  Narrative CLINICAL DATA:  Bilateral foot and ankle pain for 6 weeks, no new injury  EXAM: LEFT ANKLE COMPLETE - 3+ VIEW  COMPARISON:  None.  FINDINGS: There is degenerative change of the left ankle with some loss of joint space and spurring from both the medial and lateral malleolus. Degenerative spurs emanate from the calcaneus as well and there is some degenerative change in the midfoot. No acute abnormality is seen.  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 No acute abnormality.   Electronically Signed By: Ivar Drape M.D. On: 11/26/2017 16:04  Foot Imaging:  DG Foot Complete Right  Narrative CLINICAL DATA:  Bilateral foot pain  EXAM: RIGHT FOOT COMPLETE - 3+ VIEW  COMPARISON:  11/26/2017  FINDINGS: No fracture or malalignment. Joint space narrowing at the first MTP and IP joint. Joint space narrowing at  the fifth IP joint. Bulky os trigonum. Degenerative changes of the subtalar joint. Intertarsal degenerative changes.  IMPRESSION: Degenerative changes.  No acute osseous abnormality.   Electronically Signed By: Donavan Foil M.D. On: 01/30/2020 21:34  DG Foot Complete Left  Narrative CLINICAL DATA:  Bilateral foot pain  EXAM: LEFT FOOT - COMPLETE 3+ VIEW  COMPARISON:  11/26/2017  FINDINGS: No fracture or malalignment. Mild degenerative change at the first MTP joint. Degenerative changes at the TMT joints. Intertarsal joint space narrowing and osteophytes. Large plantar and posterior calcaneal spurs.  IMPRESSION: Degenerative changes.  No acute osseous abnormality.   Electronically Signed By: Donavan Foil M.D. On: 01/30/2020 21:31  Complexity Note: Imaging results reviewed.             Pre-op Assessment:  Ms. Wan is a 69 y.o. (year old), female patient, seen today for interventional treatment. She  has a past surgical history that includes Abdominal hysterectomy; Total shoulder replacement; Replacement total knee; Appendectomy; Lumbar fusion; Carpal tunnel release; Back surgery (07/25/2016); Cataract extraction w/PHACO (Right, 10/13/2019); and Cataract extraction w/PHACO (Left, 11/24/2019). Ms. Pharr has a current medication list which includes the following prescription(s): albuterol, budesonide-formoterol, vitamin d3, ergocalciferol, fenofibrate, furosemide, levothyroxine, loratadine, meloxicam, nitroglycerin, NON FORMULARY, oxygen-helium, rosuvastatin, tiotropium, tizanidine, tramadol, and ubrogepant, and the following Facility-Administered Medications: fentanyl, lactated ringers, and midazolam. Her primarily concern today is the Shoulder Pain (bilateral)  Initial Vital Signs:  Pulse/HCG Rate: 72ECG Heart Rate: 69 Temp: (!) 97.4 F (36.3 C) Resp: 16 BP: (!) 146/72 SpO2: 100 %  BMI: Estimated body mass index is 41.54 kg/m as calculated from the following:    Height as of this encounter: '5\' 4"'  (1.626 m).   Weight as of this encounter: 242 lb (109.8 kg).  Risk Assessment: Allergies: Reviewed. She has No Known Allergies.  Allergy Precautions: None required Coagulopathies: Reviewed. None identified.  Blood-thinner therapy: None at this time Active Infection(s): Reviewed. None identified. Ms. Beining is afebrile  Site Confirmation: Ms. Eduardo was asked to confirm the procedure and laterality before marking the site Procedure checklist: Completed Consent: Before the procedure and under the influence of no sedative(s), amnesic(s), or anxiolytics, the patient was informed of the treatment options, risks and possible complications. To fulfill our ethical and legal obligations, as recommended by the American Medical Association's Code of Ethics, I have informed the patient of my clinical impression; the nature and purpose of the treatment or procedure; the risks, benefits, and possible complications of the intervention; the alternatives, including doing nothing; the risk(s) and benefit(s) of the alternative treatment(s) or procedure(s); and the risk(s) and benefit(s) of doing nothing. The patient was provided information about the general risks and possible complications associated with the procedure. These may include, but are not limited to: failure to achieve desired goals, infection, bleeding, organ or nerve damage, allergic reactions, paralysis, and death. In addition, the patient was informed of those risks and complications associated to the procedure, such as failure to decrease pain; infection; bleeding; organ or nerve damage with subsequent damage to sensory, motor, and/or autonomic systems, resulting in permanent pain, numbness, and/or weakness of one or several areas of the body; allergic reactions; (i.e.: anaphylactic reaction); and/or death. Furthermore, the patient was informed of those risks  and complications associated with the medications. These  include, but are not limited to: allergic reactions (i.e.: anaphylactic or anaphylactoid reaction(s)); adrenal axis suppression; blood sugar elevation that in diabetics may result in ketoacidosis or comma; water retention that in patients with history of congestive heart failure may result in shortness of breath, pulmonary edema, and decompensation with resultant heart failure; weight gain; swelling or edema; medication-induced neural toxicity; particulate matter embolism and blood vessel occlusion with resultant organ, and/or nervous system infarction; and/or aseptic necrosis of one or more joints. Finally, the patient was informed that Medicine is not an exact science; therefore, there is also the possibility of unforeseen or unpredictable risks and/or possible complications that may result in a catastrophic outcome. The patient indicated having understood very clearly. We have given the patient no guarantees and we have made no promises. Enough time was given to the patient to ask questions, all of which were answered to the patient's satisfaction. Ms. Gondek has indicated that she wanted to continue with the procedure. Attestation: I, the ordering provider, attest that I have discussed with the patient the benefits, risks, side-effects, alternatives, likelihood of achieving goals, and potential problems during recovery for the procedure that I have provided informed consent. Date  Time: 02/07/2020  8:14 AM  Pre-Procedure Preparation:  Monitoring: As per clinic protocol. Respiration, ETCO2, SpO2, BP, heart rate and rhythm monitor placed and checked for adequate function Safety Precautions: Patient was assessed for positional comfort and pressure points before starting the procedure. Time-out: I initiated and conducted the "Time-out" before starting the procedure, as per protocol. The patient was asked to participate by confirming the accuracy of the "Time Out" information. Verification of the correct  person, site, and procedure were performed and confirmed by me, the nursing staff, and the patient. "Time-out" conducted as per Joint Commission's Universal Protocol (UP.01.01.01). Time: 0906  Description of Procedure:          Target Area: The sub-costal neurovascular bundle area Approach: Sub-costal approach Area Prepped: Entire Mid-Axillary Line  Thoracic Region DuraPrep (Iodine Povacrylex [0.7% available iodine] and Isopropyl Alcohol, 74% w/w) Safety Precautions: Aspiration looking for blood return was conducted prior to all injections. At no point did we inject any substances, as a needle was being advanced. No attempts were made at seeking any paresthesias. Safe injection practices and needle disposal techniques used. Medications properly checked for expiration dates. SDV (single dose vial) medications used. Description of the Procedure: Protocol guidelines were followed. The patient was placed in position over the procedure table. The target area was identified and the area prepped in the usual manner. Skin & deeper tissues infiltrated with local anesthetic. After cleaning the skin with an antiseptic solution, 1-2 mL of dilute local anesthetic was infiltrated subcutaneously at the planned injection site. The fingers of the palpating hand were used to straddle the insertion site at the inferior border of the rib and fix the skin to avoid unwanted skin movement. Appropriate amount of time allowed to pass for local anesthetics to take effect. The procedure needles were then advanced to the target area at an angle of approximately 20 cephalad to the skin. Contact with the rib was made. While maintaining the same angle of insertion, the needle was walked off the inferior border of the rib as the skin was allowed to return to its initial position. Then the needle was advanced no more than 3 mm below the inferior margin of the rib. Proper needle placement was secured. Negative aspiration confirmed.  Following  negative aspiration for blood or air, 3-5 mL of local anesthetic was injected. The solution injected in intermittent fashion, asking for systemic symptoms every 0.5cc of injectate. The needle was then removed and the area cleansed, making sure to leave some of the prepping solution back to take advantage of its long term bactericidal properties. Vitals:   02/07/20 0858 02/07/20 0904 02/07/20 0910 02/07/20 0917  BP: 116/78 116/76 (!) 115/54 129/63  Pulse:      Resp: '18 16 18 18  ' Temp:      SpO2: 96% 99% 97% 98%  Weight:      Height:        Start Time: 0906 hrs. End Time: 0917 hrs.   Materials:  Needle(s) Type: Regular needle Gauge: 25G Length: 1.5-in Medication(s): Please see orders for medications and dosing details.  Imaging Guidance (Non-Spinal):          Type of Imaging Technique: Fluoroscopy Guidance (Non-Spinal) Indication(s): Assistance in needle guidance and placement for procedures requiring needle placement in or near specific anatomical locations not easily accessible without such assistance. Exposure Time: Please see nurses notes. Contrast: None used. Fluoroscopic Guidance: I was personally present during the use of fluoroscopy. "Tunnel Vision Technique" used to obtain the best possible view of the target area. Parallax error corrected before commencing the procedure. "Direction-depth-direction" technique used to introduce the needle under continuous pulsed fluoroscopy. Once target was reached, antero-posterior, oblique, and lateral fluoroscopic projection used confirm needle placement in all planes. Images permanently stored in EMR. Interpretation: No contrast injected. I personally interpreted the imaging intraoperatively. Adequate needle placement confirmed in multiple planes. Permanent images saved into the patient's record.  Antibiotic Prophylaxis:   Anti-infectives (From admission, onward)   None     Indication(s): None identified  Post-operative  Assessment:  Post-procedure Vital Signs:  Pulse/HCG Rate: 7267 Temp: (!) 97.4 F (36.3 C) Resp: 18 BP: 129/63 SpO2: 98 %  EBL: None  Complications: No immediate post-treatment complications observed by team, or reported by patient.  Note: The patient tolerated the entire procedure well. A repeat set of vitals were taken after the procedure and the patient was kept under observation following institutional policy, for this type of procedure. Post-procedural neurological assessment was performed, showing return to baseline, prior to discharge. The patient was provided with post-procedure discharge instructions, including a section on how to identify potential problems. Should any problems arise concerning this procedure, the patient was given instructions to immediately contact us, at any time, without hesitation. In any case, we plan to contact the patient by telephone for a follow-up status report regarding this interventional procedure.  Comments:  No additional relevant information.  Plan of Care  Orders:  Orders Placed This Encounter  Procedures  . INTERCOSTAL NERVE BLOCK    Scheduling Instructions:     Procedure: Intercostal nerve block (ICNB)     Laterality: Left-sided     Sedation: Patient's choice.     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    Answer:   [facility]  . DG PAIN CLIN[facility] C-ARM 1-60 MIN NO REPORT    Intraoperative interpretation by procedural physician at [place].    Standing Status:   Standing    Number of Occurrences:   1    Order Specific Question:   Reason for exam:  Answer:   Assistance in needle guidance and placement for procedures requiring needle placement in or near specific anatomical locations not easily accessible without such assistance.  . Informed Consent  Details: Physician/Practitioner Attestation; Transcribe to consent form and obtain patient signature    Provider Attestation: I, Kane Dossie Arbour, MD, (Pain Management Specialist), the physician/practitioner, attest that I have discussed with the patient the benefits, risks, side effects, alternatives, likelihood of achieving goals and potential problems during recovery for the procedure that I have provided informed consent.    Scheduling Instructions:     Procedure: Left intercostal nerve blocks     Indication/Reason: Left rib pain/intercostal pain     Note: Always confirm laterality of pain with Ms. Wiers, before procedure.  . Provide equipment / supplies at bedside    Equipment required: Single use, disposable, "Block Tray"    Standing Status:   Standing    Number of Occurrences:   1    Order Specific Question:   Specify    Answer:   Block Tray   Chronic Opioid Analgesic:  Tramadol 50 mg, 2 tabs PO q 6 hrs (400 mg/day of tramadol) MME/day:40 mg/day.   Medications ordered for procedure: Meds ordered this encounter  Medications  . lidocaine (XYLOCAINE) 2 % (with pres) injection 400 mg  . lactated ringers infusion 1,000 mL  . midazolam (VERSED) 5 MG/5ML injection 1-2 mg    Make sure Flumazenil is available in the pyxis when using this medication. If oversedation occurs, administer 0.2 mg IV over 15 sec. If after 45 sec no response, administer 0.2 mg again over 1 min; may repeat at 1 min intervals; not to exceed 4 doses (1 mg)  . fentaNYL (SUBLIMAZE) injection 25-50 mcg    Make sure Narcan is available in the pyxis when using this medication. In the event of respiratory depression (RR< 8/min): Titrate NARCAN (naloxone) in increments of 0.1 to 0.2 mg IV at 2-3 minute intervals, until desired degree of reversal.  . ropivacaine (PF) 2 mg/mL (0.2%) (NAROPIN) injection 9 mL  . dexamethasone (DECADRON) injection 10 mg   Medications administered: We administered lidocaine, ropivacaine  (PF) 2 mg/mL (0.2%), and dexamethasone.  See the medical record for exact dosing, route, and time of administration.  Follow-up plan:   Return in about 2 weeks (around 02/21/2020) for (VV), (PP).       Interventional management options:  Considering:   Possiblebilateral suprascapular RFA  Bilateral intra-articular Hip injection  Diagnostic bilateral cervical facet block  Possible bilateral cervical facet RFA  Diagnostic bilateral lumbar facet block  Possible bilateral lumbar facet RFA  Diagnostic bilateral dorsolateral junction of Cuboid and 5th PMC, foot steroid injection #2  Diagnostic right dorsolateral Tarsometatarsal joint 4, foot steroid injection #2    Palliative PRN treatment(s):   Palliative right L3-4 LESI #3  Palliative right L4 TFESI #3  Palliative right L2-3 LESI #2  Palliative right C7-T1 CESI #2  Palliativeleft IA shoulder joint injection #2  Palliativeright suprascapular NB #2  Palliativeleft suprascapular NB #3  Palliative bilateral IA Hip injection #2  Therapeutic right finger #4 (ring) A-1 pulley area, trigger finger tendon sheath injection #2      Recent Visits Date Type Provider Dept  01/30/20 Office Visit Milinda Pointer, MD [facility]  Showing recent visits within past 90 days and meeting all other requirements Today's Visits Date Type Provider Dept  02/07/20 Procedure visit Milinda Pointer, MD [facility]  Showing today's visits and meeting all other requirements  Future Appointments Date Type Provider Dept  02/22/20 Appointment Milinda Pointer, MD [facility]  04/25/20 Appointment Milinda Pointer, MD [facility]  Showing future appointments within next 90 days and meeting all other requirements  Disposition: Discharge home  Discharge (Date  Time): 02/07/2020; 0930 hrs.   Primary Care Physician: Lenard Simmer, MD Location: [facility] Note by: Gaspar Cola, MD Date: 02/07/2020; Time: 11:39 AM  Disclaimer:  Medicine is not an Chief Strategy Officer. The only guarantee in medicine is that nothing is guaranteed. It is important to note that the decision to proceed with this intervention was based on the information collected from the patient. The Data and conclusions were drawn from the patient's questionnaire, the interview, and the physical examination. Because the information was provided in large part by the patient, it cannot be guaranteed that it has not been purposely or unconsciously manipulated. Every effort has been made to obtain as much relevant data as possible for this evaluation. It is important to note that the conclusions that lead to this procedure are derived in large part from the available data. Always take into account that the treatment will also be dependent on availability of resources and existing treatment guidelines, considered by other Pain Management Practitioners as being common knowledge and practice, at the time of the intervention. For Medico-Legal purposes, it is also important to point out that variation in procedural techniques and pharmacological choices are the acceptable norm. The indications, contraindications, technique, and results of the above procedure should only be interpreted and judged by a Board-Certified Interventional Pain Specialist with extensive familiarity and expertise in the same exact procedure and technique.

## 2020-02-07 NOTE — Patient Instructions (Addendum)
____________________________________________________________________________________________  Post-Procedure Discharge Instructions  Instructions:  Apply ice:   Purpose: This will minimize any swelling and discomfort after procedure.   When: Day of procedure, as soon as you get home.  How: Fill a plastic sandwich bag with crushed ice. Cover it with a small towel and apply to injection site.  How long: (15 min on, 15 min off) Apply for 15 minutes then remove x 15 minutes.  Repeat sequence on day of procedure, until you go to bed.  Apply heat:   Purpose: To treat any soreness and discomfort from the procedure.  When: Starting the next day after the procedure.  How: Apply heat to procedure site starting the day following the procedure.  How long: May continue to repeat daily, until discomfort goes away.  Food intake: Start with clear liquids (like water) and advance to regular food, as tolerated.   Physical activities: Keep activities to a minimum for the first 8 hours after the procedure. After that, then as tolerated.  Driving: If you have received any sedation, be responsible and do not drive. You are not allowed to drive for 24 hours after having sedation.  Blood thinner: (Applies only to those taking blood thinners) You may restart your blood thinner 6 hours after your procedure.  Insulin: (Applies only to Diabetic patients taking insulin) As soon as you can eat, you may resume your normal dosing schedule.  Infection prevention: Keep procedure site clean and dry. Shower daily and clean area with soap and water.  Post-procedure Pain Diary: Extremely important that this be done correctly and accurately. Recorded information will be used to determine the next step in treatment. For the purpose of accuracy, follow these rules:  Evaluate only the area treated. Do not report or include pain from an untreated area. For the purpose of this evaluation, ignore all other areas of pain,  except for the treated area.  After your procedure, avoid taking a long nap and attempting to complete the pain diary after you wake up. Instead, set your alarm clock to go off every hour, on the hour, for the initial 8 hours after the procedure. Document the duration of the numbing medicine, and the relief you are getting from it.  Do not go to sleep and attempt to complete it later. It will not be accurate. If you received sedation, it is likely that you were given a medication that may cause amnesia. Because of this, completing the diary at a later time may cause the information to be inaccurate. This information is needed to plan your care.  Follow-up appointment: Keep your post-procedure follow-up evaluation appointment after the procedure (usually 2 weeks for most procedures, 6 weeks for radiofrequencies). DO NOT FORGET to bring you pain diary with you.   Expect: (What should I expect to see with my procedure?)  From numbing medicine (AKA: Local Anesthetics): Numbness or decrease in pain. You may also experience some weakness, which if present, could last for the duration of the local anesthetic.  Onset: Full effect within 15 minutes of injected.  Duration: It will depend on the type of local anesthetic used. On the average, 1 to 8 hours.   From steroids (Applies only if steroids were used): Decrease in swelling or inflammation. Once inflammation is improved, relief of the pain will follow.  Onset of benefits: Depends on the amount of swelling present. The more swelling, the longer it will take for the benefits to be seen. In some cases, up to 10 days.  Duration: Steroids will stay in the system x 2 weeks. Duration of benefits will depend on multiple posibilities including persistent irritating factors.  Side-effects: If present, they may typically last 2 weeks (the duration of the steroids).  Frequent: Cramps (if they occur, drink Gatorade and take over-the-counter Magnesium 450-500 mg  once to twice a day); water retention with temporary weight gain; increases in blood sugar; decreased immune system response; increased appetite.  Occasional: Facial flushing (red, warm cheeks); mood swings; menstrual changes.  Uncommon: Long-term decrease or suppression of natural hormones; bone thinning. (These are more common with higher doses or more frequent use. This is why we prefer that our patients avoid having any injection therapies in other practices.)   Very Rare: Severe mood changes; psychosis; aseptic necrosis.  From procedure: Some discomfort is to be expected once the numbing medicine wears off. This should be minimal if ice and heat are applied as instructed.  Call if: (When should I call?)  You experience numbness and weakness that gets worse with time, as opposed to wearing off.  New onset bowel or bladder incontinence. (Applies only to procedures done in the spine)  Emergency Numbers:  Durning business hours (Monday - Thursday, 8:00 AM - 4:00 PM) (Friday, 9:00 AM - 12:00 Noon): [phone]  After hours: [phone]  NOTE: If you are having a problem and are unable connect with, or to talk to a provider, then go to your nearest urgent care or emergency department. If the problem is serious and urgent, please call 911. ____________________________________________________________________________________________    Laboratory Chemistry Profile   Renal Lab Results  Component Value Date   BUN 8 01/30/2020   CREATININE 0.83 01/30/2020   GFRAA >60 01/30/2020   GFRNONAA >60 01/30/2020     Electrolytes Lab Results  Component Value Date   NA 140 01/30/2020   K 4.1 01/30/2020   CL 108 01/30/2020   CALCIUM 8.9 01/30/2020   MG 2.0 01/30/2020     Hepatic Lab Results  Component Value Date   AST 29 01/30/2020   ALT 25 01/30/2020   ALBUMIN 3.7 01/30/2020   ALKPHOS 77 01/30/2020     ID Lab Results  Component Value Date   SARSCOV2NAA NEGATIVE  11/22/2019     Bone Lab Results  Component Value Date   25OHVITD1 111 (H) 01/30/2020   25OHVITD2 30 01/30/2020   25OHVITD3 81 01/30/2020     Endocrine Lab Results  Component Value Date   GLUCOSE 95 01/30/2020     Neuropathy Lab Results  Component Value Date   VITAMINB12 504 01/30/2020     CNS No results found.   Inflammation (CRP: Acute  ESR: Chronic) Lab Results  Component Value Date   CRP 0.7 01/30/2020   ESRSEDRATE 21 01/30/2020     Rheumatology No results found.   Coagulation No results found.   Cardiovascular No results found.   Screening Lab Results  Component Value Date   [place] NEGATIVE 11/22/2019     Cancer No results found.   Allergens No results found.     Note: Lab results reviewed.  Imaging Review  Cervical Imaging: MR CERVICAL SPINE WO CONTRAST  Narrative CLINICAL DATA:  Neck pain for 1 year.  Bilateral hand weakness.  EXAM: MRI CERVICAL SPINE WITHOUT CONTRAST  TECHNIQUE: Multiplanar, multisequence MR imaging of the cervical spine was performed. No intravenous contrast was administered.  COMPARISON:  Cervical spine MRI 10/15/2012  FINDINGS: Alignment: Physiologic.  Vertebrae: There is C4-C7 ACDF.  No focal  marrow abnormality.  Cord: Normal signal and morphology.  Posterior Fossa, vertebral arteries, paraspinal tissues: Visualized posterior fossa is normal. Vertebral artery flow voids are preserved. Normal visualized paraspinal soft tissues.  Disc levels:  C1-C2: Normal.  C2-C3: Normal disc space and facets. No spinal canal or neuroforaminal stenosis.  C3-C4: Bilateral mild uncovertebral hypertrophy with mild right and moderate left neural foraminal stenosis.  C4-C5: Postfusion changes.  No stenosis.  C5-C6: Postfusion changes. Left uncovertebral spurring with mild left foraminal stenosis.  C6-C7: Postfusion changes. Small central disc osteophyte complex without spinal canal stenosis. Mild bilateral  uncovertebral spurring with mild narrowing of the left neural foramen.  C7-T1: Normal disc space and facets. No spinal canal or neuroforaminal stenosis.  IMPRESSION: 1. C4-C7 ACDF without spinal canal stenosis. 2. Mild right and moderate left foraminal stenosis at C3-C4. 3. Mild left foraminal stenosis at C5-C6 and C6-C7 secondary to uncovertebral spurring.   Electronically Signed By: Ulyses Jarred M.D. On: 03/17/2017 14:39  DG Cervical Spine With Flex & Extend  Narrative CLINICAL DATA:  Neck pain  EXAM: CERVICAL SPINE COMPLETE WITH FLEXION AND EXTENSION VIEWS  COMPARISON:  MRI 03/17/2017, radiograph 11/04/2013  FINDINGS: Dens and lateral masses are within normal limits. Straightening of the cervical spine. Anterior plate, fixating screws and interbody devices C4 through C7. No significant change in alignment with flexion or extension. The vertebral body heights are grossly maintained. Degenerative changes at C2-C3 and C3-C4. diffuse bilateral foraminal narrowing at multiple levels.  IMPRESSION: Postsurgical changes C4 through C7 without acute osseous abnormality. Degenerative changes elsewhere in the spine. No definite change in alignment with flexion or extension.   Electronically Signed By: Donavan Foil M.D. On: 01/30/2020 21:30  Shoulder Imaging: DG Shoulder Right  Narrative CLINICAL DATA:  Right shoulder pain  EXAM: RIGHT SHOULDER - 2+ VIEW  COMPARISON:  MRI 01/21/2014  FINDINGS: Tapered appearance of the distal right clavicle with widened AC joint presumably due to prior clavicle resection. No fracture identified. Previous reverse right shoulder replacement. The components appear normally aligned on the Y-view. There is slight anterior positioning of the humeral component with respect to the glenoid component on the axillary view.  IMPRESSION: 1. No acute osseous abnormality. Probable prior resection of the distal right clavicle 2. Status post  reversed right shoulder replacement. There is slight anterior positioning of the humeral component with respect to the glenoid component on the axillary view with the glenoid component directed posteriorly.   Electronically Signed By: Donavan Foil M.D. On: 01/30/2020 21:44  DG Shoulder Left  Narrative CLINICAL DATA:  Left shoulder pain  EXAM: LEFT SHOULDER - 2+ VIEW  COMPARISON:  MRI 11/10/2013  FINDINGS: Moderate AC joint degenerative change. No fracture or malalignment. Moderate degenerative change of the glenohumeral joint. Postsurgical changes of the left ribs.  IMPRESSION: 1. Moderate AC joint and glenohumeral degenerative change   Electronically Signed By: Donavan Foil M.D. On: 01/30/2020 21:45  Lumbosacral Imaging: MR Lumbar Spine Wo Contrast  Narrative CLINICAL DATA:  Low back and bilateral upper leg pain for 2 years, worsened over the past year. History of prior lumbar surgery.  EXAM: MRI LUMBAR SPINE WITHOUT CONTRAST  TECHNIQUE: Multiplanar, multisequence MR imaging of the lumbar spine was performed. No intravenous contrast was administered.  COMPARISON:  Plain films lumbar spine 11/04/2013 and MRI lumbar spine 10/15/2012.  FINDINGS: The patient is status post L4-5 fusion as seen on the prior examination. Vertebral body height is maintained. 0.7 cm retrolisthesis L3 on L4 is unchanged since  the prior study. Degenerative endplate signal change is seen at L3-4. There is no worrisome marrow lesion. The conus medullaris is normal in signal and position. Imaged intra-abdominal contents are unremarkable.  T11-12 and T12-L1 are imaged in the sagittal plane only. There is a shallow disc bulge at T11-12 effacing the ventral thecal sac. T12-L1 is unremarkable. The appearance of these levels is unchanged.  L1-2: There is a shallow down turning central disc protrusion without central or foraminal stenosis, unchanged.  L2-3: Facet degenerative disease  and a shallow disc bulge does not appear notably changed. The central canal and foramina appear open.  L3-4: There has been progression of disease at this level. The patient has a disc bulge with superimposed foraminal protrusions bilaterally, larger on the right. Facet arthropathy is noted. There is moderate central canal stenosis. Severe bilateral foraminal narrowing is worse on the right and increased since the prior study.  L4-5: Status post laminectomy and fusion. The central canal and foramina are widely patent.  L5-S1: There is some facet degenerative disease. No disc bulge or protrusion. The central canal and foramina are open. The appearance is unchanged.  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 Foraminal narrowing appears worse on the right. The central canal is mildly narrowed at this level. The appearance of the lumbar spine is otherwise unchanged.  Status post L4-5 discectomy and fusion. The central canal and foramina are widely patent.   Electronically Signed By: Inge Rise M.D. On: 10/04/2015 10:15  DG Lumbar Spine Complete  Addendum 01/30/2020 10:25 PM ADDENDUM REPORT: 01/30/2020 22:22  ADDENDUM: Degenerative osteophytes at L1-L2, L2-L3 and L5-S1 with relatively maintained disc spaces.   Electronically Signed By: Donavan Foil M.D. On: 01/30/2020 22:22  Narrative CLINICAL DATA:  Back pain  EXAM: LUMBAR SPINE - COMPLETE 4+ VIEW  COMPARISON:  MRI 10/04/2015, CT 12/04/2017  FINDINGS: Sagittal alignment is within normal limits. Posterior rod and fixating screws L3 through L5 with interbody device at L3-L4. Diffuse degenerative osteophytes throughout the thoracolumbar spine. Degenerative changes at L5-S1.  IMPRESSION: Postsurgical changes L3 through L5 with multiple level degenerative change elsewhere  in the lumbar spine. No acute osseous abnormality.  Electronically Signed: By: Donavan Foil M.D. On: 01/30/2020 21:37  Ankle Imaging: DG Ankle Complete Right  Narrative CLINICAL DATA:  Bilateral foot and ankle pain for 6 weeks, no new injury  EXAM: RIGHT ANKLE - COMPLETE 3+ VIEW  COMPARISON:  None.  FINDINGS: There are degenerative changes in the hindfoot and ankle joint with some loss of joint space and spurring present. Calcaneal degenerative spurs are noted. The ankle joint is relatively normally aligned with no acute abnormality. Probable old healed fracture of the distal right fibula is noted.  IMPRESSION: Degenerative change in the ankle joint and hindfoot. No acute abnormality.   Electronically Signed By: Ivar Drape M.D. On: 11/26/2017 16:07  DG Ankle Complete Left  Narrative CLINICAL DATA:  Bilateral foot and ankle pain for 6 weeks, no new injury  EXAM: LEFT ANKLE COMPLETE - 3+ VIEW  COMPARISON:  None.  FINDINGS: There is degenerative change of the left ankle with some loss of joint space and spurring from both the medial and lateral malleolus. Degenerative spurs emanate from the calcaneus as well and there is some degenerative change in the midfoot. No acute abnormality is seen.  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 No  acute abnormality.   Electronically Signed By: Ivar Drape M.D. On: 11/26/2017 16:04  Foot Imaging: DG Foot Complete Right  Narrative CLINICAL DATA:  Bilateral foot pain  EXAM: RIGHT FOOT COMPLETE - 3+ VIEW  COMPARISON:  11/26/2017  FINDINGS: No fracture or malalignment. Joint space narrowing at the first MTP and IP joint. Joint space narrowing at the fifth IP joint. Bulky os trigonum. Degenerative changes of the subtalar joint. Intertarsal degenerative changes.  IMPRESSION: Degenerative changes.  No acute osseous abnormality.   Electronically Signed By: Donavan Foil  M.D. On: 01/30/2020 21:34  DG Foot Complete Left  Narrative CLINICAL DATA:  Bilateral foot pain  EXAM: LEFT FOOT - COMPLETE 3+ VIEW  COMPARISON:  11/26/2017  FINDINGS: No fracture or malalignment. Mild degenerative change at the first MTP joint. Degenerative changes at the TMT joints. Intertarsal joint space narrowing and osteophytes. Large plantar and posterior calcaneal spurs.  IMPRESSION: Degenerative changes.  No acute osseous abnormality.   Electronically Signed By: Donavan Foil M.D. On: 01/30/2020 21:31  Complexity Note: Imaging results reviewed.             Pain Management Discharge Instructions  General Discharge Instructions :  If you need to reach your doctor call: Monday-Friday 8:00 am - 4:00 pm at [phone] or toll free [phone].  After clinic hours [phone] to have operator reach doctor.  Bring all of your medication bottles to all your appointments in the pain clinic.  To cancel or reschedule your appointment with Pain Management please remember to call 24 hours in advance to avoid a fee.  Refer to the educational materials which you have been given on: General Risks, I had my Procedure. Discharge Instructions, Post Sedation.  Post Procedure Instructions:  The drugs you were given will stay in your system until tomorrow, so for the next 24 hours you should not drive, make any legal decisions or drink any alcoholic beverages.  You may eat anything you prefer, but it is better to start with liquids then soups and crackers, and gradually work up to solid foods.  Please notify your doctor immediately if you have any unusual bleeding, trouble breathing or pain that is not related to your normal pain.  Depending on the type of procedure that was done, some parts of your body may feel week and/or numb.  This usually clears up by tonight or the next day.  Walk with the use of an assistive device or accompanied by an adult for the 24  hours.  You may use ice on the affected area for the first 24 hours.  Put ice in a Ziploc bag and cover with a towel and place against area 15 minutes on 15 minutes off.  You may switch to heat after 24 hours.  Respiratory precautions given per MD at end of procedure.

## 2020-02-08 ENCOUNTER — Encounter: Payer: Self-pay | Admitting: Internal Medicine

## 2020-02-08 ENCOUNTER — Telehealth: Payer: Self-pay

## 2020-02-08 DIAGNOSIS — Z91199 Patient's noncompliance with other medical treatment and regimen due to unspecified reason: Secondary | ICD-10-CM

## 2020-02-08 NOTE — Telephone Encounter (Signed)
Post procedure phone call.  LM 

## 2020-02-08 NOTE — Progress Notes (Unsigned)
Scanned in Overnight Ox Results.

## 2020-02-21 ENCOUNTER — Encounter: Payer: Self-pay | Admitting: Pain Medicine
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
## 2020-02-22 ENCOUNTER — Other Ambulatory Visit: Payer: Self-pay

## 2020-02-22 ENCOUNTER — Ambulatory Visit: Payer: Medicare Other | Attending: Pain Medicine | Admitting: Pain Medicine

## 2020-02-22 DIAGNOSIS — M19072 Primary osteoarthritis, left ankle and foot: Secondary | ICD-10-CM

## 2020-02-22 DIAGNOSIS — G8929 Other chronic pain: Secondary | ICD-10-CM

## 2020-02-22 DIAGNOSIS — G894 Chronic pain syndrome: Secondary | ICD-10-CM

## 2020-02-22 DIAGNOSIS — M19071 Primary osteoarthritis, right ankle and foot: Secondary | ICD-10-CM

## 2020-02-22 DIAGNOSIS — R0782 Intercostal pain: Secondary | ICD-10-CM

## 2020-02-22 DIAGNOSIS — M79672 Pain in left foot: Secondary | ICD-10-CM | POA: Diagnosis not present

## 2020-02-22 DIAGNOSIS — M79671 Pain in right foot: Secondary | ICD-10-CM | POA: Diagnosis not present

## 2020-02-22 NOTE — Patient Instructions (Signed)
____________________________________________________________________________________________  Preparing for Procedure with Sedation  Procedure appointments are limited to planned procedures: . No Prescription Refills. . No disability issues will be discussed. . No medication changes will be discussed.  Instructions: . Oral Intake: Do not eat or drink anything for at least 8 hours prior to your procedure. (Exception: Blood Pressure Medication. See below.) . Transportation: Unless otherwise stated by your physician, you may drive yourself after the procedure. . Blood Pressure Medicine: Do not forget to take your blood pressure medicine with a sip of water the morning of the procedure. If your Diastolic (lower reading)is above 100 mmHg, elective cases will be cancelled/rescheduled. . Blood thinners: These will need to be stopped for procedures. Notify our staff if you are taking any blood thinners. Depending on which one you take, there will be specific instructions on how and when to stop it. . Diabetics on insulin: Notify the staff so that you can be scheduled 1st case in the morning. If your diabetes requires high dose insulin, take only  of your normal insulin dose the morning of the procedure and notify the staff that you have done so. . Preventing infections: Shower with an antibacterial soap the morning of your procedure. . Build-up your immune system: Take 1000 mg of Vitamin C with every meal (3 times a day) the day prior to your procedure. . Antibiotics: Inform the staff if you have a condition or reason that requires you to take antibiotics before dental procedures. . Pregnancy: If you are pregnant, call and cancel the procedure. . Sickness: If you have a cold, fever, or any active infections, call and cancel the procedure. . Arrival: You must be in the facility at least 30 minutes prior to your scheduled procedure. . Children: Do not bring children with you. . Dress appropriately:  Bring dark clothing that you would not mind if they get stained. . Valuables: Do not bring any jewelry or valuables.  Reasons to call and reschedule or cancel your procedure: (Following these recommendations will minimize the risk of a serious complication.) . Surgeries: Avoid having procedures within 2 weeks of any surgery. (Avoid for 2 weeks before or after any surgery). . Flu Shots: Avoid having procedures within 2 weeks of a flu shots or . (Avoid for 2 weeks before or after immunizations). . Barium: Avoid having a procedure within 7-10 days after having had a radiological study involving the use of radiological contrast. (Myelograms, Barium swallow or enema study). . Heart attacks: Avoid any elective procedures or surgeries for the initial 6 months after a "Myocardial Infarction" (Heart Attack). . Blood thinners: It is imperative that you stop these medications before procedures. Let us know if you if you take any blood thinner.  . Infection: Avoid procedures during or within two weeks of an infection (including chest colds or gastrointestinal problems). Symptoms associated with infections include: Localized redness, fever, chills, night sweats or profuse sweating, burning sensation when voiding, cough, congestion, stuffiness, runny nose, sore throat, diarrhea, nausea, vomiting, cold or Flu symptoms, recent or current infections. It is specially important if the infection is over the area that we intend to treat. . Heart and lung problems: Symptoms that may suggest an active cardiopulmonary problem include: cough, chest pain, breathing difficulties or shortness of breath, dizziness, ankle swelling, uncontrolled high or unusually low blood pressure, and/or palpitations. If you are experiencing any of these symptoms, cancel your procedure and contact your primary care physician for an evaluation.  Remember:  Regular Business hours are:    Monday to Thursday 8:00 AM to 4:00 PM  Provider's  Schedule: Jontavia Leatherbury, MD:  Procedure days: Tuesday and Thursday 7:30 AM to 4:00 PM  Bilal Lateef, MD:  Procedure days: Monday and Wednesday 7:30 AM to 4:00 PM ____________________________________________________________________________________________    

## 2020-02-23 ENCOUNTER — Telehealth: Payer: Self-pay

## 2020-02-23 NOTE — Telephone Encounter (Signed)
Confirmed appointment on 02/27/2020 and screened for covid. klh 

## 2020-02-27 ENCOUNTER — Ambulatory Visit (INDEPENDENT_AMBULATORY_CARE_PROVIDER_SITE_OTHER): Payer: Medicare Other | Admitting: Adult Health

## 2020-02-27 ENCOUNTER — Encounter: Payer: Self-pay | Admitting: Adult Health

## 2020-02-27 VITALS — Resp 16 | Ht 64.0 in | Wt 240.0 lb

## 2020-02-27 DIAGNOSIS — Z9989 Dependence on other enabling machines and devices: Secondary | ICD-10-CM

## 2020-02-27 DIAGNOSIS — G4733 Obstructive sleep apnea (adult) (pediatric): Secondary | ICD-10-CM | POA: Diagnosis not present

## 2020-02-27 DIAGNOSIS — R400 Somnolence: Secondary | ICD-10-CM

## 2020-02-27 DIAGNOSIS — J449 Chronic obstructive pulmonary disease, unspecified: Secondary | ICD-10-CM | POA: Diagnosis not present
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
## 2020-02-29 NOTE — Progress Notes (Signed)
PROVIDER NOTE: Information contained herein reflects review and annotations entered in association with encounter. Interpretation of such information and data should be left to medically-trained personnel. Information provided to patient can be located elsewhere in the medical record under "Patient Instructions". Document created using STT-dictation technology, any transcriptional errors that may result from process are unintentional.    Patient: Regina Campos  Service Category: Procedure  Provider: Gaspar Cola, MD  DOB: April 05, 1951  DOS: 03/01/2020  Location: [facility]  MRN: [id number]  Setting: Ambulatory - outpatient  Referring Provider: Lenard Simmer, MD  Type: Established Patient  Specialty: Interventional Pain Management  PCP: Lenard Simmer, MD   Primary Reason for Visit: Interventional Pain Management Treatment. CC: No chief complaint on file.  Procedure:          Anesthesia, Analgesia, Anxiolysis:  Type: Diagnostic Foot Steroid Injection 4th TMT #2  Region: Dorsal Tarsometatarsal (TMT) joint 4 Target Area: 4th Tarsometatarsal (TMT)(Lisfranc) joint Level: Foot Laterality: Bilateral  Type: Local Anesthesia Indication(s): Analgesia         Local Anesthetic: Lidocaine 1-2% Route: Infiltration ([place]/IM) IV Access: Declined Sedation: Declined   Position: Supine   Indications: 1. Chronic foot pain (Right)   2. Chronic foot pain (Left)   3. Chronic ankle pain (Bilateral) (R>L)   4. Foot pain, bilateral (R>L)   5. Osteoarthritis of feet (Bilateral)   6. Osteoarthritis of ankles (Bilateral)   7. Pain in joint involving ankle and foot, unspecified laterality    Pain Score: Pre-procedure: 8 /10 Post-procedure: 1  (when standing)/10   Pre-op Assessment:  Regina Campos is a 69 y.o. (year old), female patient, seen today for interventional treatment. She  has a past surgical history that includes Abdominal hysterectomy; Total shoulder replacement;  Replacement total knee; Appendectomy; Lumbar fusion; Carpal tunnel release; Back surgery (07/25/2016); Cataract extraction w/PHACO (Right, 10/13/2019); and Cataract extraction w/PHACO (Left, 11/24/2019). Regina Campos has a current medication list which includes the following prescription(s): albuterol, budesonide-formoterol, vitamin d3, ergocalciferol, fenofibrate, furosemide, levothyroxine, loratadine, meloxicam, nitroglycerin, NON FORMULARY, oxygen-helium, potassium chloride, rosuvastatin, tiotropium, tizanidine, tramadol, and ubrogepant. Her primarily concern today is the No chief complaint on file.  Initial Vital Signs:  Pulse/HCG Rate: 72  Temp: (!) 97.2 F (36.2 C) Resp: 18 BP: 124/89 SpO2: 96 %  BMI: Estimated body mass index is 40.27 kg/m as calculated from the following:   Height as of this encounter: 5\' 5"  (1.651 m).   Weight as of this encounter: 242 lb (109.8 kg).  Risk Assessment: Allergies: Reviewed. She has No Known Allergies.  Allergy Precautions: None required Coagulopathies: Reviewed. None identified.  Blood-thinner therapy: None at this time Active Infection(s): Reviewed. None identified. Regina Campos is afebrile  Site Confirmation: Regina Campos was asked to confirm the procedure and laterality before marking the site Procedure checklist: Completed Consent: Before the procedure and under the influence of no sedative(s), amnesic(s), or anxiolytics, the patient was informed of the treatment options, risks and possible complications. To fulfill our ethical and legal obligations, as recommended by the American Medical Association's Code of Ethics, I have informed the patient of my clinical impression; the nature and purpose of the treatment or procedure; the risks, benefits, and possible complications of the intervention; the alternatives, including doing nothing; the risk(s) and benefit(s) of the alternative treatment(s) or procedure(s); and the risk(s) and benefit(s) of doing  nothing. The patient was provided information about the general risks and possible complications associated with the procedure. These may include, but  are not limited to: failure to achieve desired goals, infection, bleeding, organ or nerve damage, allergic reactions, paralysis, and death. In addition, the patient was informed of those risks and complications associated to the procedure, such as failure to decrease pain; infection; bleeding; organ or nerve damage with subsequent damage to sensory, motor, and/or autonomic systems, resulting in permanent pain, numbness, and/or weakness of one or several areas of the body; allergic reactions; (i.e.: anaphylactic reaction); and/or death. Furthermore, the patient was informed of those risks and complications associated with the medications. These include, but are not limited to: allergic reactions (i.e.: anaphylactic or anaphylactoid reaction(s)); adrenal axis suppression; blood sugar elevation that in diabetics may result in ketoacidosis or comma; water retention that in patients with history of congestive heart failure may result in shortness of breath, pulmonary edema, and decompensation with resultant heart failure; weight gain; swelling or edema; medication-induced neural toxicity; particulate matter embolism and blood vessel occlusion with resultant organ, and/or nervous system infarction; and/or aseptic necrosis of one or more joints. Finally, the patient was informed that Medicine is not an exact science; therefore, there is also the possibility of unforeseen or unpredictable risks and/or possible complications that may result in a catastrophic outcome. The patient indicated having understood very clearly. We have given the patient no guarantees and we have made no promises. Enough time was given to the patient to ask questions, all of which were answered to the patient's satisfaction. Regina Campos has indicated that she wanted to continue with the  procedure. Attestation: I, the ordering provider, attest that I have discussed with the patient the benefits, risks, side-effects, alternatives, likelihood of achieving goals, and potential problems during recovery for the procedure that I have provided informed consent. Date  Time: 03/01/2020  8:04 AM  Pre-Procedure Preparation:  Monitoring: As per clinic protocol. Respiration, ETCO2, SpO2, BP, heart rate and rhythm monitor placed and checked for adequate function Safety Precautions: Patient was assessed for positional comfort and pressure points before starting the procedure. Time-out: I initiated and conducted the "Time-out" before starting the procedure, as per protocol. The patient was asked to participate by confirming the accuracy of the "Time Out" information. Verification of the correct person, site, and procedure were performed and confirmed by me, the nursing staff, and the patient. "Time-out" conducted as per Joint Commission's Universal Protocol (UP.01.01.01). Time: 5625  Description of Procedure:          Approach: Dorsal approach. Area Prepped: Entire ankle and dorsal foot Region DuraPrep (Iodine Povacrylex [0.7% available iodine] and Isopropyl Alcohol, 74% w/w) Safety Precautions: Aspiration looking for blood return was conducted prior to all injections. At no point did we inject any substances, as a needle was being advanced. No attempts were made at seeking any paresthesias. Safe injection practices and needle disposal techniques used. Medications properly checked for expiration dates. SDV (single dose vial) medications used. Description of the Procedure: Protocol guidelines were followed. The patient was placed in position. The target area was identified and the area prepped in the usual manner. Skin & deeper tissues infiltrated with local anesthetic. Appropriate amount of time allowed to pass for local anesthetics to take effect. The procedure needles were then advanced to the  target area. Proper needle placement secured. Negative aspiration confirmed. Solution injected in intermittent fashion, asking for systemic symptoms every 0.5cc of injectate. The needles were then removed and the area cleansed, making sure to leave some of the prepping solution back to take advantage of its long term bactericidal properties.  Vitals:   03/01/20 0840 03/01/20 0850 03/01/20 0900 03/01/20 0906  BP: (!) 159/93 (!) 147/94 (!) 165/87 (!) 160/84  Pulse: 78 81 70 72  Resp: 16 17 19 16   Temp:      TempSrc:      SpO2: 98% 99% 98% 99%  Weight:      Height:        Start Time: 0855 hrs. End Time: 0902 hrs.  Materials:  Needle(s) Type: Spinal Needle Gauge: 22G Length: 3.5-in Medication(s): Please see orders for medications and dosing details.  Imaging Guidance:          Type of Imaging Technique: Fluoroscopy Guidance (Non-spinal) Indication(s): Assistance in needle guidance and placement for procedures requiring needle placement in or near specific anatomical locations not easily accessible without such assistance. Exposure Time: Please see nurses notes. Contrast: None used. Fluoroscopic Guidance: I was personally present during the use of fluoroscopy. "Tunnel Vision Technique" used to obtain the best possible view of the target area. Parallax error corrected before commencing the procedure. "Direction-depth-direction" technique used to introduce the needle under continuous pulsed fluoroscopy. Once target was reached, antero-posterior, oblique, and lateral fluoroscopic projection used confirm needle placement in all planes. Images permanently stored in EMR. Ultrasound Guidance: N/A Interpretation: No contrast injected. I personally interpreted the imaging intraoperatively. Adequate needle placement confirmed in multiple planes. Permanent images saved into the patient's record.  Antibiotic Prophylaxis:   Anti-infectives (From admission, onward)    None     Indication(s): None identified  Post-operative Assessment:  Post-procedure Vital Signs:  Pulse/HCG Rate: 72  Temp: (!) 97.2 F (36.2 C) Resp: 16 BP: (!) 160/84 SpO2: 99 %  EBL: None  Complications: No immediate post-treatment complications observed by team, or reported by patient.  Note: The patient tolerated the entire procedure well. A repeat set of vitals were taken after the procedure and the patient was kept under observation following institutional policy, for this type of procedure. Post-procedural neurological assessment was performed, showing return to baseline, prior to discharge. The patient was provided with post-procedure discharge instructions, including a section on how to identify potential problems. Should any problems arise concerning this procedure, the patient was given instructions to immediately contact us, at any time, without hesitation. In any case, we plan to contact the patient by telephone for a follow-up status report regarding this interventional procedure.  Comments:  No additional relevant information.  Plan of Care  Orders:  Orders Placed This Encounter  Procedures  . Small Joint Injection/Arthrocentesis    Level(s): Fourth tarsometatarsal foot joint Laterality: Bilateral Sedation: Patient's choice Purpose: Palliative Indication(s): Sub-acute pain    Scheduling Instructions:     Requested Scheduling Timeframe: Today  . [facility] C-ARM 1-60 MIN NO REPORT    Intraoperative interpretation by procedural physician at [place].    Standing Status:   Standing    Number of Occurrences:   1    Order Specific Question:   Reason for exam:    Answer:   Assistance in needle guidance and placement for procedures requiring needle placement in or near specific anatomical locations not easily accessible without such assistance.  . Informed Consent Details: Physician/Practitioner Attestation; Transcribe to consent form and obtain  patient signature    Provider Attestation: I, [place] Dossie Arbour, MD, (Pain Management Specialist), the physician/practitioner, attest that I have discussed with the patient the benefits, risks, side effects, alternatives, likelihood of achieving goals and potential problems during recovery for the procedure that I have provided  informed consent.    Scheduling Instructions:     Procedure: Bilateral tarsometatarsal joint injection (foot)     Indication/Reason: Bilateral feet osteoarthritis and arthralgia     Note: Always confirm laterality of pain with Ms. Haberman, before procedure.     Transcribe to consent form and obtain patient signature.  . Provide equipment / supplies at bedside    Equipment required: Single use, disposable, "Block Tray"    Standing Status:   Standing    Number of Occurrences:   1    Order Specific Question:   Specify    Answer:   Block Tray   Chronic Opioid Analgesic:  Tramadol 50 mg, 2 tabs PO q 6 hrs (400 mg/day of tramadol) MME/day:40 mg/day.   Medications ordered for procedure: Meds ordered this encounter  Medications  . lidocaine (XYLOCAINE) 2 % (with pres) injection 400 mg  . DISCONTD: lactated ringers infusion 1,000 mL  . DISCONTD: midazolam (VERSED) 5 MG/5ML injection 1-2 mg    Make sure Flumazenil is available in the pyxis when using this medication. If oversedation occurs, administer 0.2 mg IV over 15 sec. If after 45 sec no response, administer 0.2 mg again over 1 min; may repeat at 1 min intervals; not to exceed 4 doses (1 mg)  . DISCONTD: fentaNYL (SUBLIMAZE) injection 25-50 mcg    Make sure Narcan is available in the pyxis when using this medication. In the event of respiratory depression (RR< 8/min): Titrate NARCAN (naloxone) in increments of 0.1 to 0.2 mg IV at 2-3 minute intervals, until desired degree of reversal.  . methylPREDNISolone acetate (DEPO-MEDROL) injection 80 mg  . ropivacaine (PF) 2 mg/mL (0.2%) (NAROPIN) injection 4 mL    Medications administered: We administered lidocaine, methylPREDNISolone acetate, and ropivacaine (PF) 2 mg/mL (0.2%).  See the medical record for exact dosing, route, and time of administration.  Follow-up plan:   Return in about 2 weeks (around 03/15/2020) for VV(15-min), (PP), pm on procedure day.       Interventional management options:  Considering:   Possiblebilateral suprascapular RFA  Bilateral intra-articular Hip injection  Diagnostic bilateral cervical facet block  Possible bilateral cervical facet RFA  Diagnostic bilateral lumbar facet block  Possible bilateral lumbar facet RFA  Diagnostic right dorsolateral Tarsometatarsal joint 4, foot steroid injection #2    Palliative PRN treatment(s):   Palliative right L3-4 LESI #3  Palliative right L4 TFESI #3  Palliative right L2-3 LESI #2  Palliative right C7-T1 CESI #2  Palliativeleft IA shoulder joint injection #2  Palliativeright suprascapular NB #2  Palliativeleft suprascapular NB #3  Palliative bilateral IA Hip injection #2  Therapeutic right finger #4 (ring) A-1 pulley area, trigger finger tendon sheath injection #2  Diagnostic bilateral dorsolateral junction of Cuboid and 5th PMC, foot steroid injection #2        Recent Visits Date Type Provider Dept  02/22/20 Telemedicine Milinda Pointer, MD [facility]  02/07/20 Procedure visit Milinda Pointer, MD [facility]  01/30/20 Office Visit Milinda Pointer, MD [facility]  Showing recent visits within past 90 days and meeting all other requirements Today's Visits Date Type Provider Dept  03/01/20 Procedure visit Milinda Pointer, MD [facility]  Showing today's visits and meeting all other requirements Future Appointments Date Type Provider Dept  03/20/20 Appointment Milinda Pointer, MD [facility]  04/25/20 Appointment Milinda Pointer, MD [facility]  Showing future appointments within  next 90 days and meeting all other requirements  Disposition: Discharge home  Discharge (Date  Time): 03/01/2020; 0909 hrs.   Primary Care Physician: Lenard Simmer, MD Location: [facility] Note by: Gaspar Cola, MD Date: 03/01/2020; Time: 1:19 PM  Disclaimer:  Medicine is not an Chief Strategy Officer. The only guarantee in medicine is that nothing is guaranteed. It is important to note that the decision to proceed with this intervention was based on the information collected from the patient. The Data and conclusions were drawn from the patient's questionnaire, the interview, and the physical examination. Because the information was provided in large part by the patient, it cannot be guaranteed that it has not been purposely or unconsciously manipulated. Every effort has been made to obtain as much relevant data as possible for this evaluation. It is important to note that the conclusions that lead to this procedure are derived in large part from the available data. Always take into account that the treatment will also be dependent on availability of resources and existing treatment guidelines, considered by other Pain Management Practitioners as being common knowledge and practice, at the time of the intervention. For Medico-Legal purposes, it is also important to point out that variation in procedural techniques and pharmacological choices are the acceptable norm. The indications, contraindications, technique, and results of the above procedure should only be interpreted and judged by a Board-Certified Interventional Pain Specialist with extensive familiarity and expertise in the same exact procedure and technique.

## 2020-03-01 ENCOUNTER — Ambulatory Visit
Admission: RE | Admit: 2020-03-01 | Discharge: 2020-03-01 | Disposition: A | Discharge status: Home / Self Care | Payer: Medicare Other | Source: Ambulatory Visit | Attending: Pain Medicine | Admitting: Pain Medicine

## 2020-03-01 ENCOUNTER — Ambulatory Visit (HOSPITAL_BASED_OUTPATIENT_CLINIC_OR_DEPARTMENT_OTHER): Payer: Medicare Other | Admitting: Pain Medicine

## 2020-03-01 ENCOUNTER — Encounter: Payer: Self-pay | Admitting: Pain Medicine

## 2020-03-01 ENCOUNTER — Other Ambulatory Visit: Payer: Self-pay

## 2020-03-01 VITALS — BP 160/84 | HR 72 | Temp 97.2°F | Resp 16 | Ht 65.0 in | Wt 242.0 lb

## 2020-03-01 DIAGNOSIS — M25572 Pain in left ankle and joints of left foot: Secondary | ICD-10-CM | POA: Insufficient documentation

## 2020-03-01 DIAGNOSIS — M25571 Pain in right ankle and joints of right foot: Secondary | ICD-10-CM | POA: Insufficient documentation

## 2020-03-01 DIAGNOSIS — M19071 Primary osteoarthritis, right ankle and foot: Secondary | ICD-10-CM

## 2020-03-01 DIAGNOSIS — M79672 Pain in left foot: Secondary | ICD-10-CM

## 2020-03-01 DIAGNOSIS — M19072 Primary osteoarthritis, left ankle and foot: Secondary | ICD-10-CM | POA: Insufficient documentation

## 2020-03-01 DIAGNOSIS — G8929 Other chronic pain: Secondary | ICD-10-CM | POA: Diagnosis not present

## 2020-03-01 DIAGNOSIS — M79671 Pain in right foot: Secondary | ICD-10-CM | POA: Diagnosis not present

## 2020-03-01 DIAGNOSIS — M25579 Pain in unspecified ankle and joints of unspecified foot: Secondary | ICD-10-CM | POA: Diagnosis present

## 2020-03-01 MED ORDER — METHYLPREDNISOLONE ACETATE 80 MG/ML IJ SUSP
80.0000 mg | Freq: Once | INTRAMUSCULAR | Status: AC
  Administered 2020-03-01: 80 mg via INTRA_ARTICULAR
  Filled 2020-03-01: qty 1

## 2020-03-01 MED ORDER — ROPIVACAINE HCL 2 MG/ML IJ SOLN
4.0000 mL | Freq: Once | INTRAMUSCULAR | Status: AC
  Administered 2020-03-01: 4 mL via INTRA_ARTICULAR
  Filled 2020-03-01: qty 10

## 2020-03-01 MED ORDER — LIDOCAINE HCL 2 % IJ SOLN
20.0000 mL | Freq: Once | INTRAMUSCULAR | Status: AC
  Administered 2020-03-01: 400 mg

## 2020-03-01 MED ORDER — FENTANYL CITRATE (PF) 100 MCG/2ML IJ SOLN
25.0000 ug | INTRAMUSCULAR | Status: DC | PRN
  Filled 2020-03-01: qty 2

## 2020-03-01 MED ORDER — MIDAZOLAM HCL 5 MG/5ML IJ SOLN
1.0000 mg | INTRAMUSCULAR | Status: DC | PRN
  Filled 2020-03-01: qty 5

## 2020-03-01 MED ORDER — LIDOCAINE HCL 2 % IJ SOLN
INTRAMUSCULAR | Status: AC
  Filled 2020-03-01: qty 20

## 2020-03-01 MED ORDER — LACTATED RINGERS IV SOLN: 1000.0000 mL | Freq: Once | INTRAVENOUS | Status: DC

## 2020-03-01 NOTE — Patient Instructions (Signed)

## 2020-03-02 ENCOUNTER — Telehealth: Payer: Self-pay

## 2020-03-02 NOTE — Telephone Encounter (Signed)
POst procedure phone call.  LM  

## 2020-03-19 ENCOUNTER — Encounter: Payer: Self-pay | Admitting: Pain Medicine

## 2020-03-20 ENCOUNTER — Ambulatory Visit: Payer: Medicare Other | Attending: Pain Medicine | Admitting: Pain Medicine

## 2020-03-20 ENCOUNTER — Other Ambulatory Visit: Payer: Self-pay

## 2020-03-20 DIAGNOSIS — G894 Chronic pain syndrome: Secondary | ICD-10-CM

## 2020-03-20 DIAGNOSIS — M79671 Pain in right foot: Secondary | ICD-10-CM | POA: Diagnosis not present

## 2020-03-20 DIAGNOSIS — M19071 Primary osteoarthritis, right ankle and foot: Secondary | ICD-10-CM | POA: Diagnosis not present

## 2020-03-20 DIAGNOSIS — G8929 Other chronic pain: Secondary | ICD-10-CM

## 2020-03-20 DIAGNOSIS — M25511 Pain in right shoulder: Secondary | ICD-10-CM

## 2020-03-20 DIAGNOSIS — M79672 Pain in left foot: Secondary | ICD-10-CM | POA: Diagnosis not present

## 2020-03-20 DIAGNOSIS — R0781 Pleurodynia: Secondary | ICD-10-CM

## 2020-03-20 DIAGNOSIS — M19072 Primary osteoarthritis, left ankle and foot: Secondary | ICD-10-CM

## 2020-03-20 DIAGNOSIS — M25512 Pain in left shoulder: Secondary | ICD-10-CM
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
## 2020-04-24 ENCOUNTER — Encounter: Payer: Self-pay | Admitting: Pain Medicine

## 2020-04-25 ENCOUNTER — Other Ambulatory Visit: Payer: Self-pay

## 2020-04-25 ENCOUNTER — Ambulatory Visit: Payer: Medicare Other | Attending: Pain Medicine | Admitting: Pain Medicine

## 2020-04-25 DIAGNOSIS — M8949 Other hypertrophic osteoarthropathy, multiple sites: Secondary | ICD-10-CM

## 2020-04-25 DIAGNOSIS — M159 Polyosteoarthritis, unspecified: Secondary | ICD-10-CM

## 2020-04-25 DIAGNOSIS — G8929 Other chronic pain: Secondary | ICD-10-CM | POA: Diagnosis not present

## 2020-04-25 DIAGNOSIS — M7918 Myalgia, other site: Secondary | ICD-10-CM | POA: Diagnosis not present

## 2020-04-25 DIAGNOSIS — G894 Chronic pain syndrome: Secondary | ICD-10-CM

## 2020-04-25 MED ORDER — TRAMADOL HCL 50 MG PO TABS: 100.0000 mg | ORAL_TABLET | Freq: Four times a day (QID) | ORAL | 2 refills | Status: DC | PRN

## 2020-04-25 MED ORDER — MELOXICAM 15 MG PO TABS: 15.0000 mg | ORAL_TABLET | Freq: Every day | ORAL | 0 refills | Status: DC

## 2020-04-25 MED ORDER — TIZANIDINE HCL 4 MG PO CAPS: 4.0000 mg | ORAL_CAPSULE | Freq: Two times a day (BID) | ORAL | 2 refills | Status: DC
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
## 2020-05-14 ENCOUNTER — Telehealth: Payer: Self-pay

## 2020-05-14 NOTE — Telephone Encounter (Signed)
[facility] CONFIRMED DOWNLOAD 05/16/20

## 2020-05-16 ENCOUNTER — Ambulatory Visit (INDEPENDENT_AMBULATORY_CARE_PROVIDER_SITE_OTHER): Payer: Medicare Other

## 2020-05-16 ENCOUNTER — Other Ambulatory Visit: Payer: Self-pay

## 2020-05-16 DIAGNOSIS — G4733 Obstructive sleep apnea (adult) (pediatric): Secondary | ICD-10-CM | POA: Diagnosis not present

## 2020-05-16 NOTE — Progress Notes (Signed)
95 percentile pressure 10.8   95th percentile leak 10.8   apnea index 1.4 /hr  apnea-hypopnea index  1.4 /hr   total days used  >4 hr 85 days  total days used <4 hr 5 days  Total compliance 94 percent  She is doing great no problems or questions at this time

## 2020-05-22 ENCOUNTER — Encounter: Payer: Self-pay | Admitting: Internal Medicine

## 2020-05-22 ENCOUNTER — Ambulatory Visit (INDEPENDENT_AMBULATORY_CARE_PROVIDER_SITE_OTHER): Payer: Medicare Other | Admitting: Internal Medicine

## 2020-05-22 VITALS — BP 128/80 | HR 67 | Temp 98.3°F | Resp 16 | Ht 65.0 in | Wt 240.0 lb

## 2020-05-22 DIAGNOSIS — J449 Chronic obstructive pulmonary disease, unspecified: Secondary | ICD-10-CM | POA: Diagnosis not present

## 2020-05-22 DIAGNOSIS — J301 Allergic rhinitis due to pollen: Secondary | ICD-10-CM

## 2020-05-22 DIAGNOSIS — Z7189 Other specified counseling: Secondary | ICD-10-CM | POA: Diagnosis not present

## 2020-05-22 DIAGNOSIS — G4733 Obstructive sleep apnea (adult) (pediatric): Secondary | ICD-10-CM

## 2020-05-22 DIAGNOSIS — Z23 Encounter for immunization: Secondary | ICD-10-CM

## 2020-05-22 DIAGNOSIS — R0602 Shortness of breath: Secondary | ICD-10-CM | POA: Diagnosis not present

## 2020-05-22 DIAGNOSIS — Z9989 Dependence on other enabling machines and devices: Secondary | ICD-10-CM | POA: Diagnosis not present

## 2020-05-22 DIAGNOSIS — J4489 Other specified chronic obstructive pulmonary disease: Secondary | ICD-10-CM

## 2020-05-22 MED ORDER — LORATADINE 10 MG PO TABS: 10.0000 mg | ORAL_TABLET | Freq: Every day | ORAL | 1 refills | Status: DC

## 2020-05-22 MED ORDER — BUDESONIDE-FORMOTEROL FUMARATE 160-4.5 MCG/ACT IN AERO: 2.0000 | INHALATION_SPRAY | Freq: Two times a day (BID) | RESPIRATORY_TRACT | 3 refills | Status: DC

## 2020-05-22 NOTE — Patient Instructions (Signed)
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
## 2020-06-11 ENCOUNTER — Other Ambulatory Visit: Payer: Self-pay

## 2020-06-11 DIAGNOSIS — J449 Chronic obstructive pulmonary disease, unspecified: Secondary | ICD-10-CM

## 2020-06-20 ENCOUNTER — Ambulatory Visit (INDEPENDENT_AMBULATORY_CARE_PROVIDER_SITE_OTHER): Payer: Medicare Other | Admitting: Internal Medicine

## 2020-06-20 ENCOUNTER — Other Ambulatory Visit: Payer: Self-pay

## 2020-06-20 DIAGNOSIS — R0602 Shortness of breath: Secondary | ICD-10-CM

## 2020-06-20 DIAGNOSIS — J449 Chronic obstructive pulmonary disease, unspecified: Secondary | ICD-10-CM

## 2020-06-20 LAB — PULMONARY FUNCTION TEST

## 2020-06-21 ENCOUNTER — Telehealth: Payer: Self-pay

## 2020-06-21 NOTE — Telephone Encounter (Signed)
Pt called requesting medication for a nebulizer and a rx for nebulizer.  I looked thru pt's med list and didn't see any of the meds listed.  I advised pt that she will need an appt for the face to face to get the nebulizer and medication and she said she would call back to schedule that appt

## 2020-06-24 NOTE — Procedures (Signed)
[facility] [place], [address]  DATE OF SERVICE: June 20, 2020  Complete Pulmonary Function Testing Interpretation:  FINDINGS:  Forced vital capacity is normal.  FEV1 is normal.  FEV1 FVC ratio is normal.  Postbronchodilator there is no significant change in the FEV1 however clinical improvement may still occur in the absence of spirometric improvement.  Total lung capacity is normal.  Residual volume is normal.  Residual volume total lung capacity ratio is increased.  FRC is mildly decreased.  DLCO is mildly decreased however was normal when corrected for alveolar volume.  IMPRESSION:  This pulmonary function study is within normal limits overall.  There was a reduction in DLCO which was normalized when corrected for alveolar volume.  No significant response to bronchodilator was noted but clinical improvement may still occur.  Allyne Gee, MD [facility] Pulmonary Critical Care Medicine Sleep Medicine

## 2020-07-06 ENCOUNTER — Other Ambulatory Visit: Payer: Self-pay | Admitting: Neurology

## 2020-07-06 ENCOUNTER — Other Ambulatory Visit (HOSPITAL_COMMUNITY): Payer: Self-pay | Admitting: Neurology

## 2020-07-06 DIAGNOSIS — G43019 Migraine without aura, intractable, without status migrainosus: Secondary | ICD-10-CM

## 2020-07-10 ENCOUNTER — Other Ambulatory Visit: Payer: Self-pay | Admitting: Pain Medicine

## 2020-07-10 DIAGNOSIS — M8949 Other hypertrophic osteoarthropathy, multiple sites: Secondary | ICD-10-CM

## 2020-07-10 DIAGNOSIS — M159 Polyosteoarthritis, unspecified: Secondary | ICD-10-CM

## 2020-07-13 NOTE — Progress Notes (Deleted)
error 

## 2020-07-16 ENCOUNTER — Other Ambulatory Visit: Payer: Self-pay

## 2020-07-16 ENCOUNTER — Ambulatory Visit (INDEPENDENT_AMBULATORY_CARE_PROVIDER_SITE_OTHER): Payer: Medicare Other | Admitting: Internal Medicine

## 2020-07-16 ENCOUNTER — Encounter: Payer: Self-pay | Admitting: Internal Medicine

## 2020-07-16 VITALS — BP 130/90 | HR 73 | Temp 97.8°F | Resp 16 | Ht 65.0 in | Wt 242.0 lb

## 2020-07-16 DIAGNOSIS — J449 Chronic obstructive pulmonary disease, unspecified: Secondary | ICD-10-CM

## 2020-07-16 DIAGNOSIS — Z6841 Body Mass Index (BMI) 40.0 and over, adult: Secondary | ICD-10-CM | POA: Diagnosis not present

## 2020-07-16 DIAGNOSIS — J209 Acute bronchitis, unspecified: Secondary | ICD-10-CM

## 2020-07-16 DIAGNOSIS — J440 Chronic obstructive pulmonary disease with acute lower respiratory infection: Secondary | ICD-10-CM | POA: Diagnosis not present

## 2020-07-16 DIAGNOSIS — G4733 Obstructive sleep apnea (adult) (pediatric): Secondary | ICD-10-CM | POA: Diagnosis not present

## 2020-07-16 DIAGNOSIS — F1190 Opioid use, unspecified, uncomplicated: Secondary | ICD-10-CM

## 2020-07-16 MED ORDER — IPRATROPIUM-ALBUTEROL 0.5-2.5 (3) MG/3ML IN SOLN: 3.0000 mL | Freq: Four times a day (QID) | RESPIRATORY_TRACT | 3 refills | Status: DC | PRN

## 2020-07-16 NOTE — Patient Instructions (Signed)
Chronic Bronchitis, Adult Chronic bronchitis is long-lasting inflammation of the tubes that carry air into your lungs (bronchial tubes). This is inflammation that occurs:  On most days of the week.  For at least three months at a time.  Over a period of two years in a row. When the bronchial tubes are inflamed, they start to produce mucus. The inflammation and buildup of mucus make it more difficult to breathe. Chronic bronchitis is usually a permanent problem. It is one type of chronic obstructive pulmonary disease (COPD). People with chronic bronchitis are more likely to get frequent colds or respiratory infections. What are the causes? Chronic bronchitis most often occurs in people who:  Have chronic, severe asthma.  Have a history of smoking.  Have asthma and smoke.  Have certain lung diseases.  Have had long-term exposure to certain irritating fumes or chemicals. What are the signs or symptoms? Symptoms of chronic bronchitis may include:  A cough that brings up mucus (productive cough).  Shortness of breath.  Loud breathing (wheezing).  Chest discomfort.  Frequent (recurring) colds or respiratory infections. Certain things can trigger chronic bronchitis symptoms or make them worse, such as:  Infections.  Stopping certain medicines.  Smoking.  Exposure to chemicals. How is this diagnosed? This condition may be diagnosed based on:  Your symptoms and medical history.  A physical exam.  A chest X-ray.  Lung (pulmonary) function tests. How is this treated? There is no cure for chronic bronchitis, but treatment can help control your symptoms. Treatment may include:  Using a cool mist vaporizer or humidifier to make it easier to breathe.  Drinking more fluids. Drinking more makes your mucus thinner, which may make it easier to breathe.  Lifestyle changes, such as eating a healthier diet and getting more exercise.  Medicines, such as: ? Inhalers to improve  air flow in and out of your lungs. ? Antibiotics to treat any bacterial infections you have, such as:  Lung infection (pneumonia).  Sinus infection.  A sudden, severe (acute) episode of bronchitis.  Oxygen therapy.  Preventing infections by keeping up to date on vaccinations, including the pneumonia and flu vaccines.  Pulmonary rehabilitation. This is a program that helps you manage your breathing problems and improve your quality of life. It may last for up to 4-12 weeks and may include exercise programs, education, counseling, and treatment support. Follow these instructions at home: Medicines  Take over-the-counter and prescription medicines only as told by your health care provider.  If you were prescribed an antibiotic medicine, take it as told by your health care provider. Do not stop taking the antibiotic even if you start to feel better. Preventing infections  Get vaccinations as told by your health care provider. Make sure you get a flu shot (influenza vaccine) every year.  Wash your hands often with soap and water. If soap and water are not available, use hand sanitizer.  Avoid contact with people who have symptoms of a cold or the flu. Managing symptoms   Do not smoke, and avoid secondhand smoke. Exposure to cigarette smoke or irritating chemicals will make bronchitis worse. If you smoke and you need help quitting, ask your health care provider. Quitting smoking will help your lungs heal faster.  Use an inhaler, cool mist vaporizer, or humidifier as told by your health care provider.  Avoid pollen, dust, animal dander, molds, smoke, and other things that cause shortness of breath or wheezing attacks.  Use oxygen therapy at home as directed. Follow   instructions from your health care provider about how to use oxygen safely and take precautions to prevent fire. Make sure you never smoke while using oxygen or allow others to smoke in your home.  Do not wait to get medical  care if you have any concerning symptoms or trouble breathing. Waiting could cause permanent injury and may be life threatening. General instructions  Talk with your health care provider about what activities are safe for you and about possible exercise routines. Regular exercise is very important to help you feel better.  Drink enough fluids to keep your urine pale yellow.  Keep all follow-up visits as told by your health care provider. This is important. Contact a health care provider if:  You have coughing or shortness of breath that gets worse.  You have muscle aches.  You have chest pain.  Your mucus seems to get thicker.  Your mucus changes from clear or white to yellow, green, gray, or bloody. Get help right away if:  Your usual medicines do not stop your wheezing.  You have severe difficulty breathing. These symptoms may represent a serious problem that is an emergency. Do not wait to see if the symptoms will go away. Get medical help right away. Call your local emergency services (911 in the [place] Do not drive yourself to the hospital. Summary  Chronic bronchitis is long-lasting inflammation of the tubes that carry air into your lungs (bronchial tubes).  Chronic bronchitis is usually a permanent problem. It is one type of chronic obstructive pulmonary disease (COPD).  There is no cure for chronic bronchitis, but treatment can help control your symptoms.  Do not smoke, and avoid secondhand smoke. Exposure to cigarette smoke or irritating chemicals will make bronchitis worse. This information is not intended to replace advice given to you by your health care provider. Make sure you discuss any questions you have with your health care provider. Document Revised: 05/27/2018 Document Reviewed: 06/24/2017 Elsevier Patient Education  2020 [employer]  
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
## 2020-07-18 ENCOUNTER — Ambulatory Visit
Admission: RE | Admit: 2020-07-18 | Discharge: 2020-07-18 | Disposition: A | Discharge status: Home / Self Care | Payer: Medicare Other | Source: Ambulatory Visit | Attending: Neurology | Admitting: Neurology

## 2020-07-18 ENCOUNTER — Other Ambulatory Visit: Payer: Self-pay

## 2020-07-18 DIAGNOSIS — G43019 Migraine without aura, intractable, without status migrainosus: Secondary | ICD-10-CM | POA: Diagnosis not present

## 2020-07-19 ENCOUNTER — Other Ambulatory Visit: Payer: Self-pay | Admitting: Pain Medicine

## 2020-07-19 DIAGNOSIS — M7918 Myalgia, other site: Secondary | ICD-10-CM

## 2020-07-22 NOTE — Progress Notes (Signed)
Patient was no-show for appointment.  The office staff will contact the patient for rescheduling follow-up. 

## 2020-07-25 ENCOUNTER — Other Ambulatory Visit: Payer: Self-pay | Admitting: Pain Medicine

## 2020-07-25 ENCOUNTER — Ambulatory Visit: Payer: Medicare Other | Attending: Pain Medicine | Admitting: Pain Medicine

## 2020-07-25 ENCOUNTER — Encounter: Payer: Self-pay | Admitting: Pain Medicine

## 2020-07-25 ENCOUNTER — Other Ambulatory Visit: Payer: Self-pay

## 2020-07-25 VITALS — HR 65 | Temp 96.4°F | Resp 18 | Ht 65.0 in | Wt 242.0 lb

## 2020-07-25 DIAGNOSIS — M8949 Other hypertrophic osteoarthropathy, multiple sites: Secondary | ICD-10-CM | POA: Insufficient documentation

## 2020-07-25 DIAGNOSIS — Z79899 Other long term (current) drug therapy: Secondary | ICD-10-CM | POA: Diagnosis present

## 2020-07-25 DIAGNOSIS — G8929 Other chronic pain: Secondary | ICD-10-CM | POA: Insufficient documentation

## 2020-07-25 DIAGNOSIS — M159 Polyosteoarthritis, unspecified: Secondary | ICD-10-CM

## 2020-07-25 DIAGNOSIS — F1120 Opioid dependence, uncomplicated: Secondary | ICD-10-CM | POA: Insufficient documentation

## 2020-07-25 DIAGNOSIS — M7918 Myalgia, other site: Secondary | ICD-10-CM | POA: Diagnosis present

## 2020-07-25 DIAGNOSIS — G894 Chronic pain syndrome: Secondary | ICD-10-CM | POA: Insufficient documentation

## 2020-07-25 MED ORDER — TRAMADOL HCL 50 MG PO TABS: 100.0000 mg | ORAL_TABLET | Freq: Four times a day (QID) | ORAL | 2 refills | Status: DC | PRN

## 2020-07-25 MED ORDER — TIZANIDINE HCL 4 MG PO CAPS: 4.0000 mg | ORAL_CAPSULE | Freq: Two times a day (BID) | ORAL | 0 refills | Status: DC

## 2020-07-25 MED ORDER — MELOXICAM 15 MG PO TABS: 15.0000 mg | ORAL_TABLET | Freq: Every day | ORAL | 0 refills | Status: DC

## 2020-07-25 NOTE — Patient Instructions (Signed)
____________________________________________________________________________________________  Medication Rules  Purpose: To inform patients, and their family members, of our rules and regulations.  Applies to: All patients receiving prescriptions (written or electronic).  Pharmacy of record: Pharmacy where electronic prescriptions will be sent. If written prescriptions are taken to a different pharmacy, please inform the nursing staff. The pharmacy listed in the electronic medical record should be the one where you would like electronic prescriptions to be sent.  Electronic prescriptions: In compliance with the [place] [employer]reng[employer] Opioid Misuse Prevention (STOP) Act of 2017 (Session Law 2017-74/H243), effective August 18, 2018, all controlled substances must be electronically prescribed. Calling prescriptions to the pharmacy will cease to exist.  Prescription refills: Only during scheduled appointments. Applies to all prescriptions.  NOTE: The following applies primarily to controlled substances (Opioid* Pain Medications).   Type of encounter (visit): For patients receiving controlled substances, face-to-face visits are required. (Not an option or up to the patient.)  Patient's responsibilities: 1. Pain Pills: Bring all pain pills to every appointment (except for procedure appointments). 2. Pill Bottles: Bring pills in original pharmacy bottle. Always bring the newest bottle. Bring bottle, even if empty. 3. Medication refills: You are responsible for knowing and keeping track of what medications you take and those you need refilled. The day before your appointment: write a list of all prescriptions that need to be refilled. The day of the appointment: give the list to the admitting nurse. Prescriptions will be written only during appointments. No prescriptions will be written on procedure days. If you forget a medication: it will not be "Called in", "Faxed", or "electronically sent".  You will need to get another appointment to get these prescribed. No early refills. Do not call asking to have your prescription filled early. 4. Prescription Accuracy: You are responsible for carefully inspecting your prescriptions before leaving our office. Have the discharge nurse carefully go over each prescription with you, before taking them home. Make sure that your name is accurately spelled, that your address is correct. Check the name and dose of your medication to make sure it is accurate. Check the number of pills, and the written instructions to make sure they are clear and accurate. Make sure that you are given enough medication to last until your next medication refill appointment. 5. Taking Medication: Take medication as prescribed. When it comes to controlled substances, taking less pills or less frequently than prescribed is permitted and encouraged. Never take more pills than instructed. Never take medication more frequently than prescribed.  6. Inform other Doctors: Always inform, all of your healthcare providers, of all the medications you take. 7. Pain Medication from other Providers: You are not allowed to accept any additional pain medication from any other Doctor or Healthcare provider. There are two exceptions to this rule. (see below) In the event that you require additional pain medication, you are responsible for notifying us, as stated below. 8. Cough Medicine: Often these contain an opioid, such as codeine or hydrocodone. Never accept or take cough medicine containing these opioids if you are already taking an opioid* medication. The combination may cause respiratory failure and death. 9. Medication Agreement: You are responsible for carefully reading and following our Medication Agreement. This must be signed before receiving any prescriptions from our practice. Safely store a copy of your signed Agreement. Violations to the Agreement will result in no further prescriptions.  (Additional copies of our Medication Agreement are available upon request.) 10. Laws, Rules, & Regulations: All patients are expected to follow all   Federal and State Laws, Statutes, Rules, & Regulations. Ignorance of the Laws does not constitute a valid excuse.  11. Illegal drugs and Controlled Substances: The use of illegal substances (including, but not limited to marijuana and its derivatives) and/or the illegal use of any controlled substances is strictly prohibited. Violation of this rule may result in the immediate and permanent discontinuation of any and all prescriptions being written by our practice. The use of any illegal substances is prohibited. 12. Adopted CDC guidelines & recommendations: Target dosing levels will be at or below 60 MME/day. Use of benzodiazepines** is not recommended.  Exceptions: There are only two exceptions to the rule of not receiving pain medications from other Healthcare Providers. 1. Exception #1 (Emergencies): In the event of an emergency (i.e.: accident requiring emergency care), you are allowed to receive additional pain medication. However, you are responsible for: As soon as you are able, call our office [phone], at any time of the day or night, and leave a message stating your name, the date and nature of the emergency, and the name and dose of the medication prescribed. In the event that your call is answered by a member of our staff, make sure to document and save the date, time, and the name of the person that took your information.  2. Exception #2 (Planned Surgery): In the event that you are scheduled by another doctor or dentist to have any type of surgery or procedure, you are allowed (for a period no longer than 30 days), to receive additional pain medication, for the acute post-op pain. However, in this case, you are responsible for picking up a copy of our "Post-op Pain Management for Surgeons" handout, and giving it to your surgeon or dentist. This  document is available at our office, and does not require an appointment to obtain it. Simply go to our office during business hours (Monday-Thursday from 8:00 AM to 4:00 PM) (Friday 8:00 AM to 12:00 Noon) or if you have a scheduled appointment with us, prior to your surgery, and ask for it by name. In addition, you are responsible for: calling our office [phone], at any time of the day or night, and leaving a message stating your name, name of your surgeon, type of surgery, and date of procedure or surgery. Failure to comply with your responsibilities may result in termination of therapy involving the controlled substances.  *Opioid medications include: morphine, codeine, oxycodone, oxymorphone, hydrocodone, hydromorphone, meperidine, tramadol, tapentadol, buprenorphine, fentanyl, methadone. **Benzodiazepine medications include: diazepam (Valium), alprazolam (Xanax), clonazepam (Klonopine), lorazepam (Ativan), clorazepate (Tranxene), chlordiazepoxide (Librium), estazolam (Prosom), oxazepam (Serax), temazepam (Restoril), triazolam (Halcion) (Last updated: 07/16/2020) ____________________________________________________________________________________________   ____________________________________________________________________________________________  Medication Recommendations and Reminders  Applies to: All patients receiving prescriptions (written and/or electronic).  Medication Rules & Regulations: These rules and regulations exist for your safety and that of others. They are not flexible and neither are we. Dismissing or ignoring them will be considered "non-compliance" with medication therapy, resulting in complete and irreversible termination of such therapy. (See document titled "Medication Rules" for more details.) In all conscience, because of safety reasons, we cannot continue providing a therapy where the patient does not follow instructions.  Pharmacy of record:   Definition:  This is the pharmacy where your electronic prescriptions will be sent.   We do not endorse any particular pharmacy, however, we have experienced problems with [facility] not securing enough medication supply for the community.  We do not restrict you in your choice of pharmacy. However,   once we write for your prescriptions, we will NOT be re-sending more prescriptions to fix restricted supply problems created by your pharmacy, or your insurance.   The pharmacy listed in the electronic medical record should be the one where you want electronic prescriptions to be sent.  If you choose to change pharmacy, simply notify our nursing staff.  Recommendations:  Keep all of your pain medications in a safe place, under lock and key, even if you live alone. We will NOT replace lost, stolen, or damaged medication.  After you fill your prescription, take 1 week's worth of pills and put them away in a safe place. You should keep a separate, properly labeled bottle for this purpose. The remainder should be kept in the original bottle. Use this as your primary supply, until it runs out. Once it's gone, then you know that you have 1 week's worth of medicine, and it is time to come in for a prescription refill. If you do this correctly, it is unlikely that you will ever run out of medicine.  To make sure that the above recommendation works, it is very important that you make sure your medication refill appointments are scheduled at least 1 week before you run out of medicine. To do this in an effective manner, make sure that you do not leave the office without scheduling your next medication management appointment. Always ask the nursing staff to show you in your prescription , when your medication will be running out. Then arrange for the receptionist to get you a return appointment, at least 7 days before you run out of medicine. Do not wait until you have 1 or 2 pills left, to come in. This is very poor planning and  does not take into consideration that we may need to cancel appointments due to bad weather, sickness, or emergencies affecting our staff.  DO NOT ACCEPT A "Partial Fill": If for any reason your pharmacy does not have enough pills/tablets to completely fill or refill your prescription, do not allow for a "partial fill". The law allows the pharmacy to complete that prescription within 72 hours, without requiring a new prescription. If they do not fill the rest of your prescription within those 72 hours, you will need a separate prescription to fill the remaining amount, which we will NOT provide. If the reason for the partial fill is your insurance, you will need to talk to the pharmacist about payment alternatives for the remaining tablets, but again, DO NOT ACCEPT A PARTIAL FILL, unless you can trust your pharmacist to obtain the remainder of the pills within 72 hours.  Prescription refills and/or changes in medication(s):   Prescription refills, and/or changes in dose or medication, will be conducted only during scheduled medication management appointments. (Applies to both, written and electronic prescriptions.)  No refills on procedure days. No medication will be changed or started on procedure days. No changes, adjustments, and/or refills will be conducted on a procedure day. Doing so will interfere with the diagnostic portion of the procedure.  No phone refills. No medications will be "called into the pharmacy".  No Fax refills.  No weekend refills.  No Holliday refills.  No after hours refills.  Remember:  Business hours are:  Monday to Thursday 8:00 AM to 4:00 PM Provider's Schedule: Kataya Guimont, MD - Appointments are:  Medication management: Monday and Wednesday 8:00 AM to 4:00 PM Procedure day: Tuesday and Thursday 7:30 AM to 4:00 PM Bilal Lateef, MD - Appointments are:    Medication management: Tuesday and Thursday 8:00 AM to 4:00 PM Procedure day: Monday and Wednesday  7:30 AM to 4:00 PM (Last update: 03/07/2020) ____________________________________________________________________________________________   ____________________________________________________________________________________________  CBD (cannabidiol) WARNING  Applicable to: All individuals currently taking or considering taking CBD (cannabidiol) and, more important, all patients taking opioid analgesic controlled substances (pain medication). (Example: oxycodone; oxymorphone; hydrocodone; hydromorphone; morphine; methadone; tramadol; tapentadol; fentanyl; buprenorphine; butorphanol; dextromethorphan; meperidine; codeine; etc.)  Legal status: CBD remains a Schedule I drug prohibited for any use. CBD is illegal with one exception. In the [place], CBD has a limited Food and Drug Administration (FDA) approval for the treatment of two specific types of epilepsy disorders. Only one CBD product has been approved by the FDA for this purpose: "Epidiolex". FDA is aware that some companies are marketing products containing cannabis and cannabis-derived compounds in ways that violate the Federal Food, Drug and Cosmetic Act (FD&C Act) and that may put the health and safety of consumers at risk. The FDA, a Federal agency, has not enforced the CBD status since 2018.   Legality: Some manufacturers ship CBD products nationally, which is illegal. Often such products are sold online and are therefore available throughout the country. CBD is openly sold in head shops and health food stores in some states where such sales have not been explicitly legalized. Selling unapproved products with unsubstantiated therapeutic claims is not only a violation of the law, but also can put patients at risk, as these products have not been proven to be safe or effective. Federal illegality makes it difficult to conduct research on CBD.  Reference: "FDA Regulation of Cannabis and Cannabis-Derived Products, Including Cannabidiol  (CBD)" - https://www.fda.gov/news-events/public-health-focus/fda-regulation-cannabis-and-cannabis-derived-products-including-cannabidiol-cbd  Warning: CBD is not FDA approved and has not undergo the same manufacturing controls as prescription drugs.  This means that the purity and safety of available CBD may be questionable. Most of the time, despite manufacturer's claims, it is contaminated with THC (delta-9-tetrahydrocannabinol - the chemical in marijuana responsible for the "HIGH").  When this is the case, the THC contaminant will trigger a positive urine drug screen (UDS) test for Marijuana (carboxy-THC). Because a positive UDS for any illicit substance is a violation of our medication agreement, your opioid analgesics (pain medicine) may be permanently discontinued.  MORE ABOUT CBD  General Information: CBD  is a derivative of the Marijuana (cannabis sativa) plant discovered in 1940. It is one of the 113 identified substances found in Marijuana. It accounts for up to 40% of the plant's extract. As of 2018, preliminary clinical studies on CBD included research for the treatment of anxiety, movement disorders, and pain. CBD is available and consumed in multiple forms, including inhalation of smoke or vapor, as an aerosol spray, and by mouth. It may be supplied as an oil containing CBD, capsules, dried cannabis, or as a liquid solution. CBD is thought not to be as psychoactive as THC (delta-9-tetrahydrocannabinol - the chemical in marijuana responsible for the "HIGH"). Studies suggest that CBD may interact with different biological target receptors in the body, including cannabinoid and other neurotransmitter receptors. As of 2018 the mechanism of action for its biological effects has not been determined.  Side-effects  Adverse reactions: Dry mouth, diarrhea, decreased appetite, fatigue, drowsiness, malaise, weakness, sleep disturbances, and others.  Drug interactions: CBC may interact with other  medications such as blood-thinners. (Last update: 03/24/2020) ____________________________________________________________________________________________    
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
## 2020-07-25 NOTE — Progress Notes (Signed)
Nursing Pain Medication Assessment:  Safety precautions to be maintained throughout the outpatient stay will include: orient to surroundings, keep bed in low position, maintain call bell within reach at all times, provide assistance with transfer out of bed and ambulation.  Medication Inspection Compliance: Pill count conducted under aseptic conditions, in front of the patient. Neither the pills nor the bottle was removed from the patient's sight at any time. Once count was completed pills were immediately returned to the patient in their original bottle.  Medication: Tramadol (Ultram) Pill/Patch Count: 201 of 204 pills remain Pill/Patch Appearance: Markings consistent with prescribed medication Bottle Appearance: Standard pharmacy container. Clearly labeled. Filled Date: 22 / 21 / 2021 Last Medication intake:  Yesterday

## 2020-07-26 ENCOUNTER — Ambulatory Visit: Payer: Medicare Other

## 2020-07-26 NOTE — Patient Instructions (Incomplete)
1. 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  Please continue [place] regular.  Continue aggressive vascular risk factor control.  2. 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 In addition to pain it may help with light sensitivity, noise sensitivity and nausea. Ubrogepant blocks CGRP (calcitonin gene related peptide) receptor. It should not be used if you are taking ketoconazole, itraconazole or clarithromycin. It is not studied in pregnant and breast feeding woman, so they should avoid it.  Ubrelvy 100 mg by mouth as needed at onset or few hours into onset of headache (patient can try 50 mg dose first, if that doesn't help can take 100 mg.) If no improvement with 100 mg dose - can repeat 2nd dose in 2 hours. No more than 200 mg / day.  I 3. Obstructive sleep apnea: on AutoPAP 5-15cmH2O. Uses oxygen on 2L  4. Benign Positional Paroxysmal Vertigo - worse  Discussed dizziness at length today.  Referral Physical Therapy for Vestibular rehab if needed in the future.

## 2020-08-01 LAB — TOXASSURE SELECT 13 (MW), URINE

## 2020-08-02 ENCOUNTER — Other Ambulatory Visit: Payer: Self-pay

## 2020-08-02 ENCOUNTER — Ambulatory Visit: Payer: Medicare Other | Attending: Neurology

## 2020-08-02 DIAGNOSIS — R42 Dizziness and giddiness: Secondary | ICD-10-CM | POA: Insufficient documentation
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
## 2020-08-09 ENCOUNTER — Other Ambulatory Visit: Payer: Self-pay | Admitting: Pain Medicine

## 2020-08-09 ENCOUNTER — Encounter

## 2020-08-09 DIAGNOSIS — M159 Polyosteoarthritis, unspecified: Secondary | ICD-10-CM

## 2020-08-16 ENCOUNTER — Other Ambulatory Visit: Payer: Self-pay

## 2020-08-16 ENCOUNTER — Ambulatory Visit: Payer: Medicare Other

## 2020-08-16 DIAGNOSIS — R42 Dizziness and giddiness: Secondary | ICD-10-CM | POA: Diagnosis not present

## 2020-08-16 NOTE — Patient Instructions (Addendum)
Access Code: [email or web address] URL: [email or web address] Date: 08/16/2020 Prepared by: Ria Comment  Exercises Seated Gaze Stabilization with Head Rotation - 4 x daily - 7 x weekly - 3 reps - 30 seconds hold Standing Tandem Balance with Counter Support - 2 x daily - 7 x weekly - 3 x 30 seconds with each foot forward hold
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
## 2020-08-21 ENCOUNTER — Ambulatory Visit: Payer: Medicare Other | Admitting: Internal Medicine

## 2020-08-23 ENCOUNTER — Ambulatory Visit: Payer: Medicare Other

## 2020-08-30 ENCOUNTER — Encounter

## 2020-09-06 ENCOUNTER — Ambulatory Visit: Payer: Medicare Other | Attending: Neurology

## 2020-09-13 ENCOUNTER — Encounter

## 2020-09-20 ENCOUNTER — Ambulatory Visit: Payer: Medicare Other

## 2020-10-18 ENCOUNTER — Encounter: Admitting: Pain Medicine

## 2020-10-18 NOTE — Progress Notes (Deleted)
No show

## 2020-10-24 ENCOUNTER — Encounter: Admitting: Pain Medicine

## 2020-10-31 ENCOUNTER — Telehealth: Payer: Self-pay

## 2020-10-31 NOTE — Telephone Encounter (Signed)
Faxed requesting records to [facility] at [place] [place] office at [phone]. Regina Campos

## 2020-11-08 DIAGNOSIS — Z79891 Long term (current) use of opiate analgesic: Secondary | ICD-10-CM | POA: Insufficient documentation
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
## 2020-11-12 ENCOUNTER — Encounter: Payer: Self-pay | Admitting: Pain Medicine

## 2020-11-12 ENCOUNTER — Other Ambulatory Visit: Payer: Self-pay

## 2020-11-12 ENCOUNTER — Ambulatory Visit: Payer: Medicare Other | Attending: Pain Medicine | Admitting: Pain Medicine

## 2020-11-12 VITALS — BP 124/85 | HR 74 | Temp 97.3°F | Ht 64.0 in | Wt 242.0 lb

## 2020-11-12 DIAGNOSIS — M7072 Other bursitis of hip, left hip: Secondary | ICD-10-CM | POA: Diagnosis present

## 2020-11-12 DIAGNOSIS — R0781 Pleurodynia: Secondary | ICD-10-CM

## 2020-11-12 DIAGNOSIS — M25512 Pain in left shoulder: Secondary | ICD-10-CM

## 2020-11-12 DIAGNOSIS — M542 Cervicalgia: Secondary | ICD-10-CM

## 2020-11-12 DIAGNOSIS — M5441 Lumbago with sciatica, right side: Secondary | ICD-10-CM | POA: Diagnosis present

## 2020-11-12 DIAGNOSIS — M545 Low back pain, unspecified: Secondary | ICD-10-CM | POA: Diagnosis present

## 2020-11-12 DIAGNOSIS — Z79891 Long term (current) use of opiate analgesic: Secondary | ICD-10-CM

## 2020-11-12 DIAGNOSIS — M792 Neuralgia and neuritis, unspecified: Secondary | ICD-10-CM

## 2020-11-12 DIAGNOSIS — M25511 Pain in right shoulder: Secondary | ICD-10-CM

## 2020-11-12 DIAGNOSIS — G4486 Cervicogenic headache: Secondary | ICD-10-CM | POA: Diagnosis present

## 2020-11-12 DIAGNOSIS — M797 Fibromyalgia: Secondary | ICD-10-CM | POA: Diagnosis present

## 2020-11-12 DIAGNOSIS — F1120 Opioid dependence, uncomplicated: Secondary | ICD-10-CM

## 2020-11-12 DIAGNOSIS — Z79899 Other long term (current) drug therapy: Secondary | ICD-10-CM

## 2020-11-12 DIAGNOSIS — G894 Chronic pain syndrome: Secondary | ICD-10-CM

## 2020-11-12 DIAGNOSIS — G8929 Other chronic pain: Secondary | ICD-10-CM | POA: Diagnosis present

## 2020-11-12 DIAGNOSIS — M5442 Lumbago with sciatica, left side: Secondary | ICD-10-CM | POA: Diagnosis not present

## 2020-11-12 MED ORDER — GABAPENTIN 100 MG PO CAPS: ORAL_CAPSULE | ORAL | 0 refills | Status: DC

## 2020-11-12 MED ORDER — TRAMADOL HCL 50 MG PO TABS: 100.0000 mg | ORAL_TABLET | Freq: Four times a day (QID) | ORAL | 2 refills | Status: DC | PRN

## 2020-11-12 MED ORDER — PREDNISONE 20 MG PO TABS: ORAL_TABLET | ORAL | 0 refills | Status: AC

## 2020-11-12 MED ORDER — NALOXONE HCL 4 MG/0.1ML NA LIQD: 1.0000 | NASAL | 0 refills | Status: DC | PRN

## 2020-11-12 NOTE — Progress Notes (Signed)
Nursing Pain Medication Assessment:  Safety precautions to be maintained throughout the outpatient stay will include: orient to surroundings, keep bed in low position, maintain call bell within reach at all times, provide assistance with transfer out of bed and ambulation.  Medication Inspection Compliance: Pill count conducted under aseptic conditions, in front of the patient. Neither the pills nor the bottle was removed from the patient's sight at any time. Once count was completed pills were immediately returned to the patient in their original bottle.  Medication: Tramadol (Ultram) Pill/Patch Count: 98 of 120 pills remain Pill/Patch Appearance: Markings consistent with prescribed medication Bottle Appearance: Standard pharmacy container. Clearly labeled. Filled Date: 3 / 9 / 22 Last Medication intake:  TodaySafety precautions to be maintained throughout the outpatient stay will include: orient to surroundings, keep bed in low position, maintain call bell within reach at all times, provide assistance with transfer out of bed and ambulation.

## 2020-11-12 NOTE — Patient Instructions (Signed)

## 2020-11-14 ENCOUNTER — Ambulatory Visit (INDEPENDENT_AMBULATORY_CARE_PROVIDER_SITE_OTHER): Payer: Medicare Other

## 2020-11-14 ENCOUNTER — Other Ambulatory Visit: Payer: Self-pay

## 2020-11-14 DIAGNOSIS — G4733 Obstructive sleep apnea (adult) (pediatric): Secondary | ICD-10-CM | POA: Diagnosis not present

## 2020-11-14 NOTE — Progress Notes (Signed)
95 percentile pressure 11   95th percentile leak 1.0   apnea index 0.8 /hr  apnea-hypopnea index  0.9 /hr   total days used  >4 hr 87 days  total days used <4 hr 3 days  Total compliance 97 percent  She is doing great on cpap no problems or questions at this time.

## 2020-12-21 ENCOUNTER — Telehealth: Payer: Self-pay

## 2020-12-21 NOTE — Telephone Encounter (Signed)
LM for patient to call office to go over virtual visit questions.
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
## 2020-12-24 ENCOUNTER — Ambulatory Visit: Payer: Medicare Other | Attending: Pain Medicine | Admitting: Pain Medicine

## 2020-12-24 ENCOUNTER — Other Ambulatory Visit: Payer: Self-pay

## 2020-12-24 ENCOUNTER — Telehealth: Payer: Self-pay

## 2020-12-24 ENCOUNTER — Encounter: Payer: Self-pay | Admitting: Pain Medicine

## 2020-12-24 DIAGNOSIS — M5442 Lumbago with sciatica, left side: Secondary | ICD-10-CM | POA: Diagnosis not present

## 2020-12-24 DIAGNOSIS — M25512 Pain in left shoulder: Secondary | ICD-10-CM

## 2020-12-24 DIAGNOSIS — G4486 Cervicogenic headache: Secondary | ICD-10-CM | POA: Diagnosis not present

## 2020-12-24 DIAGNOSIS — G894 Chronic pain syndrome: Secondary | ICD-10-CM

## 2020-12-24 DIAGNOSIS — M792 Neuralgia and neuritis, unspecified: Secondary | ICD-10-CM

## 2020-12-24 DIAGNOSIS — M542 Cervicalgia: Secondary | ICD-10-CM

## 2020-12-24 DIAGNOSIS — M797 Fibromyalgia: Secondary | ICD-10-CM

## 2020-12-24 DIAGNOSIS — M25511 Pain in right shoulder: Secondary | ICD-10-CM

## 2020-12-24 DIAGNOSIS — G8929 Other chronic pain: Secondary | ICD-10-CM

## 2020-12-24 DIAGNOSIS — M5441 Lumbago with sciatica, right side: Secondary | ICD-10-CM

## 2020-12-24 MED ORDER — GABAPENTIN 300 MG PO CAPS: 300.0000 mg | ORAL_CAPSULE | Freq: Every day | ORAL | 0 refills | Status: DC

## 2020-12-24 NOTE — Telephone Encounter (Signed)
Left message for patient to call office to go over virtual appointment questions.

## 2021-01-04 ENCOUNTER — Other Ambulatory Visit: Payer: Self-pay | Admitting: Neurology

## 2021-01-04 DIAGNOSIS — G459 Transient cerebral ischemic attack, unspecified: Secondary | ICD-10-CM

## 2021-01-09 ENCOUNTER — Other Ambulatory Visit: Payer: Self-pay

## 2021-01-09 ENCOUNTER — Ambulatory Visit
Admission: RE | Admit: 2021-01-09 | Discharge: 2021-01-09 | Disposition: A | Discharge status: Home / Self Care | Payer: Medicare Other | Source: Ambulatory Visit | Attending: Neurology | Admitting: Neurology

## 2021-01-09 ENCOUNTER — Telehealth: Payer: Self-pay

## 2021-01-09 DIAGNOSIS — G459 Transient cerebral ischemic attack, unspecified: Secondary | ICD-10-CM | POA: Insufficient documentation

## 2021-01-09 NOTE — Telephone Encounter (Signed)
Verbal order for use of medical equipment signed by provider and given to [facility] with [facility]. Regina Campos

## 2021-01-15 ENCOUNTER — Encounter: Payer: Self-pay | Admitting: Internal Medicine

## 2021-01-15 ENCOUNTER — Other Ambulatory Visit: Payer: Self-pay

## 2021-01-15 ENCOUNTER — Ambulatory Visit (INDEPENDENT_AMBULATORY_CARE_PROVIDER_SITE_OTHER): Payer: Medicare Other | Admitting: Internal Medicine

## 2021-01-15 VITALS — BP 164/90 | HR 73 | Temp 98.4°F | Resp 16 | Ht 65.0 in | Wt 240.2 lb

## 2021-01-15 DIAGNOSIS — R0602 Shortness of breath: Secondary | ICD-10-CM | POA: Diagnosis not present

## 2021-01-15 DIAGNOSIS — H6690 Otitis media, unspecified, unspecified ear: Secondary | ICD-10-CM | POA: Diagnosis not present

## 2021-01-15 DIAGNOSIS — J449 Chronic obstructive pulmonary disease, unspecified: Secondary | ICD-10-CM | POA: Diagnosis not present

## 2021-01-15 DIAGNOSIS — G4733 Obstructive sleep apnea (adult) (pediatric): Secondary | ICD-10-CM | POA: Diagnosis not present

## 2021-01-15 MED ORDER — AZITHROMYCIN 250 MG PO TABS: ORAL_TABLET | ORAL | 0 refills | Status: AC
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
## 2021-03-15 ENCOUNTER — Other Ambulatory Visit: Payer: Self-pay | Admitting: Internal Medicine

## 2021-03-15 DIAGNOSIS — H6690 Otitis media, unspecified, unspecified ear: Secondary | ICD-10-CM
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
## 2021-03-27 ENCOUNTER — Encounter: Payer: Self-pay | Admitting: Pain Medicine

## 2021-03-27 ENCOUNTER — Other Ambulatory Visit: Payer: Self-pay

## 2021-03-27 ENCOUNTER — Ambulatory Visit: Payer: Medicare Other | Attending: Pain Medicine | Admitting: Pain Medicine

## 2021-03-27 VITALS — BP 139/78 | HR 76 | Temp 96.6°F | Resp 16 | Ht 65.0 in | Wt 245.0 lb

## 2021-03-27 DIAGNOSIS — M25511 Pain in right shoulder: Secondary | ICD-10-CM | POA: Insufficient documentation

## 2021-03-27 DIAGNOSIS — M48061 Spinal stenosis, lumbar region without neurogenic claudication: Secondary | ICD-10-CM | POA: Diagnosis present

## 2021-03-27 DIAGNOSIS — M25512 Pain in left shoulder: Secondary | ICD-10-CM | POA: Diagnosis present

## 2021-03-27 DIAGNOSIS — R0781 Pleurodynia: Secondary | ICD-10-CM | POA: Insufficient documentation

## 2021-03-27 DIAGNOSIS — G8929 Other chronic pain: Secondary | ICD-10-CM | POA: Diagnosis present

## 2021-03-27 DIAGNOSIS — M542 Cervicalgia: Secondary | ICD-10-CM

## 2021-03-27 DIAGNOSIS — M545 Low back pain, unspecified: Secondary | ICD-10-CM | POA: Diagnosis not present

## 2021-03-27 DIAGNOSIS — M797 Fibromyalgia: Secondary | ICD-10-CM

## 2021-03-27 DIAGNOSIS — F419 Anxiety disorder, unspecified: Secondary | ICD-10-CM

## 2021-03-27 DIAGNOSIS — G4486 Cervicogenic headache: Secondary | ICD-10-CM | POA: Diagnosis present

## 2021-03-27 DIAGNOSIS — Z79899 Other long term (current) drug therapy: Secondary | ICD-10-CM | POA: Diagnosis present

## 2021-03-27 DIAGNOSIS — Z79891 Long term (current) use of opiate analgesic: Secondary | ICD-10-CM

## 2021-03-27 DIAGNOSIS — G894 Chronic pain syndrome: Secondary | ICD-10-CM

## 2021-03-27 DIAGNOSIS — M4726 Other spondylosis with radiculopathy, lumbar region: Secondary | ICD-10-CM

## 2021-03-27 DIAGNOSIS — R0789 Other chest pain: Secondary | ICD-10-CM

## 2021-03-27 DIAGNOSIS — M961 Postlaminectomy syndrome, not elsewhere classified: Secondary | ICD-10-CM

## 2021-03-27 DIAGNOSIS — M792 Neuralgia and neuritis, unspecified: Secondary | ICD-10-CM | POA: Diagnosis present

## 2021-03-27 MED ORDER — TRAMADOL HCL 50 MG PO TABS: 100.0000 mg | ORAL_TABLET | Freq: Four times a day (QID) | ORAL | 5 refills | Status: DC

## 2021-03-27 MED ORDER — DIAZEPAM 5 MG PO TABS: 5.0000 mg | ORAL_TABLET | ORAL | 0 refills | Status: DC

## 2021-03-27 NOTE — Progress Notes (Signed)
Nursing Pain Medication Assessment:  Safety precautions to be maintained throughout the outpatient stay will include: orient to surroundings, keep bed in low position, maintain call bell within reach at all times, provide assistance with transfer out of bed and ambulation.  Medication Inspection Compliance: Pill count conducted under aseptic conditions, in front of the patient. Neither the pills nor the bottle was removed from the patient's sight at any time. Once count was completed pills were immediately returned to the patient in their original bottle.  Medication: Tramadol (Ultram) Pill/Patch Count:  0 of 240 pills remain Pill/Patch Appearance: Markings consistent with prescribed medication Bottle Appearance: Standard pharmacy container. Clearly labeled. Filled Date: 06 / 09 / 2022 Last Medication intake:  Yesterday

## 2021-03-27 NOTE — Patient Instructions (Signed)
______________________________________________________________________  Preparing for Procedure with Oral Anxiolytics  Definition: Anxiolytics - Medications that provide muscle relaxation and decrease anxiety.  Procedure appointments are limited to planned procedures: No Prescription Refills. No disability issues will be discussed. No medication changes will be discussed.  Instructions: Oral Intake: Do not eat or drink anything for at least 6 hours prior to your procedure. (Exception: Blood Pressure Medication. See below.)  Anxiolytic Medication: This medication is meant to relax you during your procedure. DO NOT DRIVE OR OPERATE DANGEROUS MACHINERY WHILE UNDER THE INFLUENCE OF THIS MEDICATION. Take the oral medication (i.e.: Valium) as prescribed on the day of your procedure with just a sip of water. Prescription will be sent to your pharmacy, prior to the day of your procedure. Topical anesthetic: Your physician might have recommended a topical anesthetic/analgesic to apply over the area where the procedure will be done. If so, apply to area 1 hour prior to procedure. For exact location of application, ask your healthcare provider.    Transportation: A driver is required. You may not drive yourself after the procedure. Blood Pressure Medicine: Do not forget to take your blood pressure medicine with a sip of water the morning of the procedure. If your Diastolic (lower reading) is above 100 mmHg, elective cases will be cancelled/rescheduled. Blood thinners: These will need to be stopped for procedures. Notify our staff if you are taking any blood thinners. Depending on which one you take, there will be specific instructions on how and when to stop it. Diabetics on insulin: Notify the staff so that you can be scheduled 1st case in the morning. If your diabetes requires high dose insulin, take only  of your normal insulin dose the morning of the procedure and notify the staff that you have done  so. Preventing infections: Shower with an antibacterial soap the morning of your procedure. Build-up your immune system: Take 1000 mg of Vitamin C with every meal (3 times a day) the day prior to your procedure. Antibiotics: Inform the staff if you have a condition or reason that requires you to take antibiotics before dental procedures. Pregnancy: If you are pregnant, call and cancel the procedure. Sickness: If you have a cold, fever, or any active infections, call and cancel the procedure. Arrival: You must be in the facility at least 30 minutes prior to your scheduled procedure. Children: Do not bring children with you. Dress appropriately: Bring dark clothing that you would not mind if they get stained. Valuables: Do not bring any jewelry or valuables.  Reasons to call and reschedule or cancel your procedure:  NOTE: Following these recommendations will minimize the risk of a serious complication. Surgeries: Avoid having procedures within 2 weeks of any surgery. (Avoid for 2 weeks before or after any surgery). Flu Shots: Avoid having procedures within 2 weeks of a flu shots. (Avoid for 2 weeks before or after immunizations). Barium: Avoid having a procedure within 7-10 days after having had a radiological study involving the use of radiological contrast. (Myelograms, Barium swallow or enema study). Heart attacks: Avoid any elective procedures or surgeries for the initial 6 months after a "Myocardial Infarction" (Heart Attack). Blood thinners: It is imperative that you stop these medications before procedures. Let us know if you if you take any blood thinner.  Infection: Avoid procedures during or within two weeks of an infection (including chest colds or gastrointestinal problems). Symptoms associated with infections include: Localized redness, fever, chills, night sweats or profuse sweating, burning sensation when voiding, cough, congestion,   stuffiness, runny nose, sore throat, diarrhea,  nausea, vomiting, cold or Flu symptoms, recent or current infections. It is especially important if the infection is over the area that we intend to treat. Heart and lung problems: Symptoms that may suggest an active cardiopulmonary problem include: cough, chest pain, breathing difficulties or shortness of breath, dizziness, ankle swelling, uncontrolled high or unusually low blood pressure, and/or palpitations. If you are experiencing any of these symptoms, cancel your procedure and contact your primary care physician for an evaluation.  Remember:  Regular Business hours are:  Monday to Thursday 8:00 AM to 4:00 PM  Provider's Schedule: Milinda Pointer, MD:  Procedure days: Tuesday and Thursday 7:30 AM to 4:00 PM  Gillis Santa, MD:  Procedure days: Monday and Wednesday 7:30 AM to 4:00 PM ______________________________________________________________________  ____________________________________________________________________________________________  General Risks and Possible Complications  Patient Responsibilities: It is important that you read this as it is part of your informed consent. It is our duty to inform you of the risks and possible complications associated with treatments offered to you. It is your responsibility as a patient to read this and to ask questions about anything that is not clear or that you believe was not covered in this document.  Patient's Rights: You have the right to refuse treatment. You also have the right to change your mind, even after initially having agreed to have the treatment done. However, under this last option, if you wait until the last second to change your mind, you may be charged for the materials used up to that point.  Introduction: Medicine is not an Chief Strategy Officer. Everything in Medicine, including the lack of treatment(s), carries the potential for danger, harm, or loss (which is by definition: Risk). In Medicine, a complication is a secondary  problem, condition, or disease that can aggravate an already existing one. All treatments carry the risk of possible complications. The fact that a side effects or complications occurs, does not imply that the treatment was conducted incorrectly. It must be clearly understood that these can happen even when everything is done following the highest safety standards.  No treatment: You can choose not to proceed with the proposed treatment alternative. The "PRO(s)" would include: avoiding the risk of complications associated with the therapy. The "CON(s)" would include: not getting any of the treatment benefits. These benefits fall under one of three categories: diagnostic; therapeutic; and/or palliative. Diagnostic benefits include: getting information which can ultimately lead to improvement of the disease or symptom(s). Therapeutic benefits are those associated with the successful treatment of the disease. Finally, palliative benefits are those related to the decrease of the primary symptoms, without necessarily curing the condition (example: decreasing the pain from a flare-up of a chronic condition, such as incurable terminal cancer).  General Risks and Complications: These are associated to most interventional treatments. They can occur alone, or in combination. They fall under one of the following six (6) categories: no benefit or worsening of symptoms; bleeding; infection; nerve damage; allergic reactions; and/or death. No benefits or worsening of symptoms: In Medicine there are no guarantees, only probabilities. No healthcare provider can ever guarantee that a medical treatment will work, they can only state the probability that it may. Furthermore, there is always the possibility that the condition may worsen, either directly, or indirectly, as a consequence of the treatment. Bleeding: This is more common if the patient is taking a blood thinner, either prescription or over the counter (example: Goody  Powders, Fish oil, Aspirin, Garlic, etc.),  or if suffering a condition associated with impaired coagulation (example: Hemophilia, cirrhosis of the liver, low platelet counts, etc.). However, even if you do not have one on these, it can still happen. If you have any of these conditions, or take one of these drugs, make sure to notify your treating physician. Infection: This is more common in patients with a compromised immune system, either due to disease (example: diabetes, cancer, human immunodeficiency virus [HIV], etc.), or due to medications or treatments (example: therapies used to treat cancer and rheumatological diseases). However, even if you do not have one on these, it can still happen. If you have any of these conditions, or take one of these drugs, make sure to notify your treating physician. Nerve Damage: This is more common when the treatment is an invasive one, but it can also happen with the use of medications, such as those used in the treatment of cancer. The damage can occur to small secondary nerves, or to large primary ones, such as those in the spinal cord and brain. This damage may be temporary or permanent and it may lead to impairments that can range from temporary numbness to permanent paralysis and/or brain death. Allergic Reactions: Any time a substance or material comes in contact with our body, there is the possibility of an allergic reaction. These can range from a mild skin rash (contact dermatitis) to a severe systemic reaction (anaphylactic reaction), which can result in death. Death: In general, any medical intervention can result in death, most of the time due to an unforeseen complication. ____________________________________________________________________________________________ ____________________________________________________________________________________________  Medication Rules  Purpose: To inform patients, and their family members, of our rules and  regulations.  Applies to: All patients receiving prescriptions (written or electronic).  Pharmacy of record: Pharmacy where electronic prescriptions will be sent. If written prescriptions are taken to a different pharmacy, please inform the nursing staff. The pharmacy listed in the electronic medical record should be the one where you would like electronic prescriptions to be sent.  Electronic prescriptions: In compliance with the [place] (STOP) Act of 2017 (Session Lanny Cramp [phone]), effective August 18, 2018, all controlled substances must be electronically prescribed. Calling prescriptions to the pharmacy will cease to exist.  Prescription refills: Only during scheduled appointments. Applies to all prescriptions.  NOTE: The following applies primarily to controlled substances (Opioid* Pain Medications).   Type of encounter (visit): For patients receiving controlled substances, face-to-face visits are required. (Not an option or up to the patient.)  Patient's responsibilities: Pain Pills: Bring all pain pills to every appointment (except for procedure appointments). Pill Bottles: Bring pills in original pharmacy bottle. Always bring the newest bottle. Bring bottle, even if empty. Medication refills: You are responsible for knowing and keeping track of what medications you take and those you need refilled. The day before your appointment: write a list of all prescriptions that need to be refilled. The day of the appointment: give the list to the admitting nurse. Prescriptions will be written only during appointments. No prescriptions will be written on procedure days. If you forget a medication: it will not be "Called in", "Faxed", or "electronically sent". You will need to get another appointment to get these prescribed. No early refills. Do not call asking to have your prescription filled early. Prescription Accuracy: You are responsible for  carefully inspecting your prescriptions before leaving our office. Have the discharge nurse carefully go over each prescription with you, before taking them home. Make sure that your  name is accurately spelled, that your address is correct. Check the name and dose of your medication to make sure it is accurate. Check the number of pills, and the written instructions to make sure they are clear and accurate. Make sure that you are given enough medication to last until your next medication refill appointment. Taking Medication: Take medication as prescribed. When it comes to controlled substances, taking less pills or less frequently than prescribed is permitted and encouraged. Never take more pills than instructed. Never take medication more frequently than prescribed.  Inform other Doctors: Always inform, all of your healthcare providers, of all the medications you take. Pain Medication from other Providers: You are not allowed to accept any additional pain medication from any other Doctor or Healthcare provider. There are two exceptions to this rule. (see below) In the event that you require additional pain medication, you are responsible for notifying us, as stated below. Cough Medicine: Often these contain an opioid, such as codeine or hydrocodone. Never accept or take cough medicine containing these opioids if you are already taking an opioid* medication. The combination may cause respiratory failure and death. Medication Agreement: You are responsible for carefully reading and following our Medication Agreement. This must be signed before receiving any prescriptions from our practice. Safely store a copy of your signed Agreement. Violations to the Agreement will result in no further prescriptions. (Additional copies of our Medication Agreement are available upon request.) Laws, Rules, & Regulations: All patients are expected to follow all Federal and [employer], [employer], Rules, [employer]. Ignorance  of the Laws does not constitute a valid excuse.  Illegal drugs and Controlled Substances: The use of illegal substances (including, but not limited to marijuana and its derivatives) and/or the illegal use of any controlled substances is strictly prohibited. Violation of this rule may result in the immediate and permanent discontinuation of any and all prescriptions being written by our practice. The use of any illegal substances is prohibited. Adopted CDC guidelines & recommendations: Target dosing levels will be at or below 60 MME/day. Use of benzodiazepines** is not recommended.  Exceptions: There are only two exceptions to the rule of not receiving pain medications from other Healthcare Providers. Exception #1 (Emergencies): In the event of an emergency (i.e.: accident requiring emergency care), you are allowed to receive additional pain medication. However, you are responsible for: As soon as you are able, call our office [phone], at any time of the day or night, and leave a message stating your name, the date and nature of the emergency, and the name and dose of the medication prescribed. In the event that your call is answered by a member of our staff, make sure to document and save the date, time, and the name of the person that took your information.  Exception #2 (Planned Surgery): In the event that you are scheduled by another doctor or dentist to have any type of surgery or procedure, you are allowed (for a period no longer than 30 days), to receive additional pain medication, for the acute post-op pain. However, in this case, you are responsible for picking up a copy of our "Post-op Pain Management for Surgeons" handout, and giving it to your surgeon or dentist. This document is available at our office, and does not require an appointment to obtain it. Simply go to our office during business hours (Monday-Thursday from 8:00 AM to 4:00 PM) (Friday 8:00 AM to 12:00 Noon) or if you have a  scheduled appointment  with [place], prior to your surgery, and ask for it by name. In addition, you are responsible for: calling our office [phone], at any time of the day or night, and leaving a message stating your name, name of your surgeon, type of surgery, and date of procedure or surgery. Failure to comply with your responsibilities may result in termination of therapy involving the controlled substances.  *Opioid medications include: morphine, codeine, oxycodone, oxymorphone, hydrocodone, hydromorphone, meperidine, tramadol, tapentadol, buprenorphine, fentanyl, methadone. **Benzodiazepine medications include: diazepam (Valium), alprazolam (Xanax), clonazepam (Klonopine), lorazepam (Ativan), clorazepate (Tranxene), chlordiazepoxide (Librium), estazolam (Prosom), oxazepam (Serax), temazepam (Restoril), triazolam (Halcion) (Last updated: 07/16/2020) ____________________________________________________________________________________________  ____________________________________________________________________________________________  Medication Recommendations and Reminders  Applies to: All patients receiving prescriptions (written and/or electronic).  Medication Rules & Regulations: These rules and regulations exist for your safety and that of others. They are not flexible and neither are we. Dismissing or ignoring them will be considered "non-compliance" with medication therapy, resulting in complete and irreversible termination of such therapy. (See document titled "Medication Rules" for more details.) In all conscience, because of safety reasons, we cannot continue providing a therapy where the patient does not follow instructions.  Pharmacy of record:  Definition: This is the pharmacy where your electronic prescriptions will be sent.  We do not endorse any particular pharmacy, however, we have experienced problems with [facility] not securing enough medication supply for the community. We  do not restrict you in your choice of pharmacy. However, once we write for your prescriptions, we will NOT be re-sending more prescriptions to fix restricted supply problems created by your pharmacy, or your insurance.  The pharmacy listed in the electronic medical record should be the one where you want electronic prescriptions to be sent. If you choose to change pharmacy, simply notify our nursing staff.  Recommendations: Keep all of your pain medications in a safe place, under lock and key, even if you live alone. We will NOT replace lost, stolen, or damaged medication. After you fill your prescription, take 1 week's worth of pills and put them away in a safe place. You should keep a separate, properly labeled bottle for this purpose. The remainder should be kept in the original bottle. Use this as your primary supply, until it runs out. Once it's gone, then you know that you have 1 week's worth of medicine, and it is time to come in for a prescription refill. If you do this correctly, it is unlikely that you will ever run out of medicine. To make sure that the above recommendation works, it is very important that you make sure your medication refill appointments are scheduled at least 1 week before you run out of medicine. To do this in an effective manner, make sure that you do not leave the office without scheduling your next medication management appointment. Always ask the nursing staff to show you in your prescription , when your medication will be running out. Then arrange for the receptionist to get you a return appointment, at least 7 days before you run out of medicine. Do not wait until you have 1 or 2 pills left, to come in. This is very poor planning and does not take into consideration that we may need to cancel appointments due to bad weather, sickness, or emergencies affecting our staff. DO NOT ACCEPT A "Partial Fill": If for any reason your pharmacy does not have enough pills/tablets to  completely fill or refill your prescription, do not allow for a "partial fill". The law  allows the pharmacy to complete that prescription within 72 hours, without requiring a new prescription. If they do not fill the rest of your prescription within those 72 hours, you will need a separate prescription to fill the remaining amount, which we will NOT provide. If the reason for the partial fill is your insurance, you will need to talk to the pharmacist about payment alternatives for the remaining tablets, but again, DO NOT ACCEPT A PARTIAL FILL, unless you can trust your pharmacist to obtain the remainder of the pills within 72 hours.  Prescription refills and/or changes in medication(s):  Prescription refills, and/or changes in dose or medication, will be conducted only during scheduled medication management appointments. (Applies to both, written and electronic prescriptions.) No refills on procedure days. No medication will be changed or started on procedure days. No changes, adjustments, and/or refills will be conducted on a procedure day. Doing so will interfere with the diagnostic portion of the procedure. No phone refills. No medications will be "called into the pharmacy". No Fax refills. No weekend refills. No Holliday refills. No after hours refills.  Remember:  Business hours are:  Monday to Thursday 8:00 AM to 4:00 PM Provider's Schedule: Milinda Pointer, MD - Appointments are:  Medication management: Monday and Wednesday 8:00 AM to 4:00 PM Procedure day: Tuesday and Thursday 7:30 AM to 4:00 PM Gillis Santa, MD - Appointments are:  Medication management: Tuesday and Thursday 8:00 AM to 4:00 PM Procedure day: Monday and Wednesday 7:30 AM to 4:00 PM (Last update: 03/07/2020) ____________________________________________________________________________________________  ____________________________________________________________________________________________  CBD (cannabidiol)  WARNING  Applicable to: All individuals currently taking or considering taking CBD (cannabidiol) and, more important, all patients taking opioid analgesic controlled substances (pain medication). (Example: oxycodone; oxymorphone; hydrocodone; hydromorphone; morphine; methadone; tramadol; tapentadol; fentanyl; buprenorphine; butorphanol; dextromethorphan; meperidine; codeine; etc.)  Legal status: CBD remains a Schedule I drug prohibited for any use. CBD is illegal with one exception. In the [place], CBD has a limited Transport planner (FDA) approval for the treatment of two specific types of epilepsy disorders. Only one CBD product has been approved by the FDA for this purpose: "Epidiolex". FDA is aware that some companies are marketing products containing cannabis and cannabis-derived compounds in ways that violate the [employer], Drug and Cosmetic Act [facility] Act) and that may put the health and safety of consumers at risk. The FDA, a Federal agency, has not enforced the CBD status since 2018.   Legality: Some manufacturers ship CBD products nationally, which is illegal. Often such products are sold online and are therefore available throughout the country. CBD is openly sold in head shops and health food stores in some states where such sales have not been explicitly legalized. Selling unapproved products with unsubstantiated therapeutic claims is not only a violation of the law, but also can put patients at risk, as these products have not been proven to be safe or effective. Federal illegality makes it difficult to conduct research on CBD.  Reference: "FDA Regulation of Cannabis and Cannabis-Derived Products, Including Cannabidiol (CBD)" - [employer]  Warning: CBD is not FDA approved and has not undergo the same manufacturing controls as prescription drugs.  This  means that the purity and safety of available CBD may be questionable. Most of the time, despite manufacturer's claims, it is contaminated with THC (delta-9-tetrahydrocannabinol - the chemical in marijuana responsible for the "HIGH").  When this is the case, the [facility] contaminant will trigger a positive urine drug screen (UDS) test for Marijuana (carboxy-THC).  Because a positive UDS for any illicit substance is a violation of our medication agreement, your opioid analgesics (pain medicine) may be permanently discontinued.  MORE ABOUT CBD  General Information: CBD  is a derivative of the Marijuana (cannabis sativa) plant discovered in 24. It is one of the 113 identified substances found in Marijuana. It accounts for up to 40% of the plant's extract. As of 2018, preliminary clinical studies on CBD included research for the treatment of anxiety, movement disorders, and pain. CBD is available and consumed in multiple forms, including inhalation of smoke or vapor, as an aerosol spray, and by mouth. It may be supplied as an oil containing CBD, capsules, dried cannabis, or as a liquid solution. CBD is thought not to be as psychoactive as THC (delta-9-tetrahydrocannabinol - the chemical in marijuana responsible for the "HIGH"). Studies suggest that CBD may interact with different biological target receptors in the body, including cannabinoid and other neurotransmitter receptors. As of 2018 the mechanism of action for its biological effects has not been determined.  Side-effects  Adverse reactions: Dry mouth, diarrhea, decreased appetite, fatigue, drowsiness, malaise, weakness, sleep disturbances, and others.  Drug interactions: CBC may interact with other medications such as blood-thinners. (Last update: 03/24/2020) ____________________________________________________________________________________________  ____________________________________________________________________________________________  Drug  Holidays (Slow)  What is a "Drug Holiday"? Drug Holiday: is the name given to the period of time during which a patient stops taking a medication(s) for the purpose of eliminating tolerance to the drug.  Benefits Improved effectiveness of opioids. Decreased opioid dose needed to achieve benefits. Improved pain with lesser dose.  What is tolerance? Tolerance: is the progressive decreased in effectiveness of a drug due to its repetitive use. With repetitive use, the body gets use to the medication and as a consequence, it loses its effectiveness. This is a common problem seen with opioid pain medications. As a result, a larger dose of the drug is needed to achieve the same effect that used to be obtained with a smaller dose.  How long should a "Drug Holiday" last? You should stay off of the pain medicine for at least 14 consecutive days. (2 weeks)  Should I stop the medicine "cold [place]"? No. You should always coordinate with your Pain Specialist so that he/she can provide you with the correct medication dose to make the transition as smoothly as possible.  How do I stop the medicine? Slowly. You will be instructed to decrease the daily amount of pills that you take by one (1) pill every seven (7) days. This is called a "slow downward taper" of your dose. For example: if you normally take four (4) pills per day, you will be asked to drop this dose to three (3) pills per day for seven (7) days, then to two (2) pills per day for seven (7) days, then to one (1) per day for seven (7) days, and at the end of those last seven (7) days, this is when the "Drug Holiday" would start.   Will I have withdrawals? By doing a "slow downward taper" like this one, it is unlikely that you will experience any significant withdrawal symptoms. Typically, what triggers withdrawals is the sudden stop of a high dose opioid therapy. Withdrawals can usually be avoided by slowly decreasing the dose over a prolonged  period of time. If you do not follow these instructions and decide to stop your medication abruptly, withdrawals may be possible.  What are withdrawals? Withdrawals: refers to the wide range of symptoms that occur  after stopping or dramatically reducing opiate drugs after heavy and prolonged use. Withdrawal symptoms do not occur to patients that use low dose opioids, or those who take the medication sporadically. Contrary to benzodiazepine (example: Valium, Xanax, etc.) or alcohol withdrawals ("Delirium Tremens"), opioid withdrawals are not lethal. Withdrawals are the physical manifestation of the body getting rid of the excess receptors.  Expected Symptoms Early symptoms of withdrawal may include: Agitation Anxiety Muscle aches Increased tearing Insomnia Runny nose Sweating Yawning  Late symptoms of withdrawal may include: Abdominal cramping Diarrhea Dilated pupils Goose bumps Nausea Vomiting  Will I experience withdrawals? Due to the slow nature of the taper, it is very unlikely that you will experience any.  What is a slow taper? Taper: refers to the gradual decrease in dose.  (Last update: 03/07/2020) ____________________________________________________________________________________________

## 2021-03-31 NOTE — Progress Notes (Addendum)
PROVIDER NOTE: Information contained herein reflects review and annotations entered in association with encounter. Interpretation of such information and data should be left to medically-trained personnel. Information provided to patient can be located elsewhere in the medical record under "Patient Instructions". Document created using STT-dictation technology, any transcriptional errors that may result from process are unintentional.    Patient: Regina Campos  Service Category: Procedure  Provider: Gaspar Cola, MD  DOB: [birth year]  DOS: 04/02/2021  Location: [facility]  MRN: [id number]  Setting: Ambulatory - outpatient  Referring Provider: Lenard Simmer, MD  Type: Established Patient  Specialty: Interventional Pain Management  PCP: Lenard Simmer, MD   Primary Reason for Visit: Interventional Pain Management Treatment. CC: Back Pain (low)  Procedure:          Anesthesia, Analgesia, Anxiolysis:  Type: Therapeutic Inter-Laminar Epidural Steroid Injection           Region: Lumbar Level: L2-3 Level. Laterality: Right         Type: Local Anesthesia Indication(s): Analgesia         Route: Oral (PO) IV Access: Declined Sedation: Meaningful verbal contact was maintained at all times during the procedure  Local Anesthetic: Lidocaine 1-2%  Position: Prone with head of the table was raised to facilitate breathing.   Indications: 1. 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 Lumbar spondylosis with radicular symptoms   3. DDD (degenerative disc disease), lumbar   4. 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 Lumbar radicular pain   6. 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 (year old), female patient, seen today for interventional treatment. She  has a past surgical history that  includes Abdominal hysterectomy; Total shoulder replacement; Replacement total knee; Appendectomy; Lumbar fusion; Carpal tunnel release; Back surgery (07/25/2016); Cataract extraction w/PHACO (Right, 10/13/2019); and Cataract extraction w/PHACO (Left, 11/24/2019). Regina Campos has a current medication list which includes the following prescription(s): albuterol, vitamin d3, diazepam, fenofibrate, furosemide, ipratropium-albuterol, levothyroxine, naloxone, NON FORMULARY, oxygen-helium, potassium chloride, rosuvastatin, tramadol, ubrogepant, budesonide-formoterol, gabapentin, and meloxicam. Her primarily concern today is the Back Pain (low)  Initial Vital Signs:  Pulse/HCG Rate: 82ECG Heart Rate: 79 Temp: (!) 96.6 F (35.9 C) Resp: 16 BP: 139/83 SpO2: 94 %  BMI: Estimated body mass index is 39.94 kg/m as calculated from the following:   Height as of this encounter: '5\' 5"'$  (1.651 m).   Weight as of this encounter: 240 lb (108.9 kg).  Risk Assessment: Allergies: Reviewed. She has No Known Allergies.  Allergy Precautions: None required Coagulopathies: Reviewed. None identified.  Blood-thinner therapy: None at this time Active Infection(s): Reviewed. None identified. Regina Campos is afebrile  Site Confirmation: Regina Campos was asked to confirm the procedure and laterality before marking the site Procedure checklist: Completed Consent: Before the procedure and under the influence of no sedative(s), amnesic(s), or anxiolytics, the patient was informed of the treatment options, risks and possible complications. To fulfill our ethical and legal obligations, as recommended by the American Medical Association's Code of Ethics, I have informed the patient of my clinical impression; the nature and purpose of the treatment or procedure; the risks, benefits, and possible complications of the intervention; the alternatives, including doing nothing; the risk(s) and benefit(s) of the alternative treatment(s) or  procedure(s); and the risk(s) and benefit(s) of doing nothing.  The patient was provided information about the general risks and possible complications associated with the procedure. These may include, but are not limited to: failure to achieve desired goals, infection, bleeding, organ or nerve damage, allergic reactions, paralysis, and death. In addition, the patient was informed of those risks and complications associated to Spine-related procedures, such as failure to decrease pain; infection (i.e.: Meningitis, epidural or intraspinal abscess); bleeding (i.e.: epidural hematoma, subarachnoid hemorrhage, or any other type of intraspinal or peri-dural bleeding); organ or nerve damage (i.e.: Any type of peripheral nerve, nerve root, or spinal cord injury) with subsequent damage to sensory, motor, and/or autonomic systems, resulting in permanent pain, numbness, and/or weakness of one or several areas of the body; allergic reactions; (i.e.: anaphylactic reaction); and/or death. Furthermore, the patient was informed of those risks and complications associated with the medications. These include, but are not limited to: allergic reactions (i.e.: anaphylactic or anaphylactoid reaction(s)); adrenal axis suppression; blood sugar elevation that in diabetics may result in ketoacidosis or comma; water retention that in patients with history of congestive heart failure may result in shortness of breath, pulmonary edema, and decompensation with resultant heart failure; weight gain; swelling or edema; medication-induced neural toxicity; particulate matter embolism and blood vessel occlusion with resultant organ, and/or nervous system infarction; and/or aseptic necrosis of one or more joints. Finally, the patient was informed that Medicine is not an exact science; therefore, there is also the possibility of unforeseen or unpredictable risks and/or possible complications that may result in a catastrophic outcome. The patient  indicated having understood very clearly. We have given the patient no guarantees and we have made no promises. Enough time was given to the patient to ask questions, all of which were answered to the patient's satisfaction. Ms. Salom has indicated that she wanted to continue with the procedure. Attestation: I, the ordering provider, attest that I have discussed with the patient the benefits, risks, side-effects, alternatives, likelihood of achieving goals, and potential problems during recovery for the procedure that I have provided informed consent. Date  Time: 04/02/2021 10:43 AM  Pre-Procedure Preparation:  Monitoring: As per clinic protocol. Respiration, ETCO2, SpO2, BP, heart rate and rhythm monitor placed and checked for adequate function Safety Precautions: Patient was assessed for positional comfort and pressure points before starting the procedure. Time-out: I initiated and conducted the "Time-out" before starting the procedure, as per protocol. The patient was asked to participate by confirming the accuracy of the "Time Out" information. Verification of the correct person, site, and procedure were performed and confirmed by me, the nursing staff, and the patient. "Time-out" conducted as per Joint Commission's Universal Protocol (UP.01.01.01). Time: 1139  Description of Procedure:          Target Area: The interlaminar space, initially targeting the lower laminar border of the superior vertebral body. Approach: Paramedial approach. Area Prepped: Entire Posterior Lumbar Region DuraPrep (Iodine Povacrylex [0.7% available iodine] and Isopropyl Alcohol, 74% w/w) Safety Precautions: Aspiration looking for blood return was conducted prior to all injections. At no point did we inject any substances, as a needle was being advanced. No attempts were made at seeking any paresthesias. Safe injection practices and needle disposal techniques used. Medications properly checked for expiration dates. SDV  (single dose vial) medications used. Description of the Procedure: Protocol guidelines were followed. The procedure needle was introduced through the skin, ipsilateral to the reported pain, and advanced to the target area. Bone was contacted and the needle walked caudad, until the lamina was cleared. The  epidural space was identified using "loss-of-resistance technique" with 2-3 ml of PF-NaCl (0.9% NSS), in a 5cc LOR glass syringe.  Vitals:   04/02/21 1132 04/02/21 1139 04/02/21 1145 04/02/21 1147  BP: (!) 134/57 (!) 145/71 132/62 126/70  Pulse:      Resp: '18 16 16 18  '$ Temp:      TempSrc:      SpO2: 96% 95% 96% 97%  Weight:      Height:        Start Time: 1139 hrs. End Time: 1146 hrs.  Materials:  Needle(s) Type: Epidural needle Gauge: 17G Length: 3.5-in Medication(s): Please see orders for medications and dosing details.  Imaging Guidance (Spinal):          Type of Imaging Technique: Fluoroscopy Guidance (Spinal) Indication(s): Assistance in needle guidance and placement for procedures requiring needle placement in or near specific anatomical locations not easily accessible without such assistance. Exposure Time: Please see nurses notes. Contrast: Before injecting any contrast, we confirmed that the patient did not have an allergy to iodine, shellfish, or radiological contrast. Once satisfactory needle placement was completed at the desired level, radiological contrast was injected. Contrast injected under live fluoroscopy. No contrast complications. See chart for type and volume of contrast used. Fluoroscopic Guidance: I was personally present during the use of fluoroscopy. "Tunnel Vision Technique" used to obtain the best possible view of the target area. Parallax error corrected before commencing the procedure. "Direction-depth-direction" technique used to introduce the needle under continuous pulsed fluoroscopy. Once target was reached, antero-posterior, oblique, and lateral  fluoroscopic projection used confirm needle placement in all planes. Images permanently stored in EMR. Interpretation: I personally interpreted the imaging intraoperatively. Adequate needle placement confirmed in multiple planes. Appropriate spread of contrast into desired area was observed. No evidence of afferent or efferent intravascular uptake. No intrathecal or subarachnoid spread observed. Permanent images saved into the patient's record.  Antibiotic Prophylaxis:   Anti-infectives (From admission, onward)    None      Indication(s): None identified  Post-operative Assessment:  Post-procedure Vital Signs:  Pulse/HCG Rate: 8279 Temp: (!) 96.6 F (35.9 C) Resp: 18 BP: 126/70 SpO2: 97 %  EBL: None  Complications: No immediate post-treatment complications observed by team, or reported by patient.  Note: The patient tolerated the entire procedure well. A repeat set of vitals were taken after the procedure and the patient was kept under observation following institutional policy, for this type of procedure. Post-procedural neurological assessment was performed, showing return to baseline, prior to discharge. The patient was provided with post-procedure discharge instructions, including a section on how to identify potential problems. Should any problems arise concerning this procedure, the patient was given instructions to immediately contact us, at any time, without hesitation. In any case, we plan to contact the patient by telephone for a follow-up status report regarding this interventional procedure.  Comments:  No additional relevant information.  Plan of Care  Orders:  Orders Placed This Encounter  Procedures   Lumbar Epidural Injection    Scheduling Instructions:     Procedure: Interlaminar LESI L2-3     Laterality: Right-sided     Sedation: No Sedation     Timeframe:  Today    Order Specific Question:   Where will this procedure be performed?    Answer:   [facility]   [facility] C-ARM 1-60 MIN NO REPORT    Intraoperative interpretation by procedural physician at [place].    Standing Status:  Standing    Number of Occurrences:   1    Order Specific Question:   Reason for exam:    Answer:   Assistance in needle guidance and placement for procedures requiring needle placement in or near specific anatomical locations not easily accessible without such assistance.   Informed Consent Details: Physician/Practitioner Attestation; Transcribe to consent form and obtain patient signature    Note: Always confirm laterality of pain with Ms. Wingard, before procedure. Transcribe to consent form and obtain patient signature.    Order Specific Question:   Physician/Practitioner attestation of informed consent for procedure/surgical case    Answer:   I, the physician/practitioner, attest that I have discussed with the patient the benefits, risks, side effects, alternatives, likelihood of achieving goals and potential problems during recovery for the procedure that I have provided informed consent.    Order Specific Question:   Procedure    Answer:   Lumbar epidural steroid injection under fluoroscopic guidance    Order Specific Question:   Physician/Practitioner performing the procedure    Answer:   Aileen Amore A. Dossie Arbour, MD    Order Specific Question:   Indication/Reason    Answer:   Low back and/or lower extremity pain secondary to lumbar radiculitis   Provide equipment / supplies at bedside    "Epidural Tray" (Disposable  single use) Catheter: NOT required    Standing Status:   Standing    Number of Occurrences:   1    Order Specific Question:   Specify    Answer:   Epidural Tray    Chronic Opioid Analgesic:  Tramadol 50 mg, 2 tabs PO q 6 hrs (400 mg/day of tramadol) MME/day: 40 mg/day.   Medications ordered for procedure: Meds ordered this encounter  Medications   iohexol (OMNIPAQUE) 180 MG/ML injection 10 mL    Must be  Myelogram-compatible. If not available, you may substitute with a water-soluble, non-ionic, hypoallergenic, myelogram-compatible radiological contrast medium.   lidocaine (XYLOCAINE) 2 % (with pres) injection 400 mg   triamcinolone acetonide (KENALOG-40) injection 40 mg   sodium chloride flush (NS) 0.9 % injection 2 mL   ropivacaine (PF) 2 mg/mL (0.2%) (NAROPIN) injection 2 mL    Medications administered: We administered iohexol, lidocaine, triamcinolone acetonide, sodium chloride flush, and ropivacaine (PF) 2 mg/mL (0.2%).  See the medical record for exact dosing, route, and time of administration.  Follow-up plan:   Return in about 2 weeks (around 04/16/2021) for (T,Th), (VV) (PPE).       Interventional management options:  Considering:   Possible bilateral suprascapular RFA  Bilateral intra-articular Hip injection  Diagnostic bilateral cervical facet block  Possible bilateral cervical facet RFA  Diagnostic bilateral lumbar facet block  Possible bilateral lumbar facet RFA  Diagnostic right dorsolateral Tarsometatarsal joint 4, foot steroid injection #2    Palliative PRN treatment(s):   Palliative right L3-4 LESI #3  Palliative right L4 TFESI #3  Palliative right L2-3 LESI #2  Palliative right C7-T1 CESI #2  Palliative left IA shoulder joint injection #2  Palliative right suprascapular NB #2  Palliative left suprascapular NB #3  Palliative bilateral IA Hip injection #2  Therapeutic right finger #4 (ring) A-1 pulley area, trigger finger tendon sheath injection #2  Diagnostic bilateral dorsolateral junction of Cuboid and 5th PMC, foot steroid injection #2     Recent Visits Date Type Provider Dept  03/27/21 Office Visit Milinda Pointer, MD [facility]  Showing recent visits within past 90 days and meeting all other requirements  Today's Visits Date Type Provider Dept  04/02/21 Procedure visit Milinda Pointer, MD [facility]  Showing today's visits and  meeting all other requirements Future Appointments Date Type Provider Dept  05/06/21 Appointment Milinda Pointer, MD [facility]  Showing future appointments within next 90 days and meeting all other requirements Disposition: Discharge home  Discharge (Date  Time): 04/02/2021; 1156 hrs.   Primary Care Physician: Lenard Simmer, MD Location: [facility] Note by: Gaspar Cola, MD Date: 04/02/2021; Time: 3:13 PM  Disclaimer:  Medicine is not an Chief Strategy Officer. The only guarantee in medicine is that nothing is guaranteed. It is important to note that the decision to proceed with this intervention was based on the information collected from the patient. The Data and conclusions were drawn from the patient's questionnaire, the interview, and the physical examination. Because the information was provided in large part by the patient, it cannot be guaranteed that it has not been purposely or unconsciously manipulated. Every effort has been made to obtain as much relevant data as possible for this evaluation. It is important to note that the conclusions that lead to this procedure are derived in large part from the available data. Always take into account that the treatment will also be dependent on availability of resources and existing treatment guidelines, considered by other Pain Management Practitioners as being common knowledge and practice, at the time of the intervention. For Medico-Legal purposes, it is also important to point out that variation in procedural techniques and pharmacological choices are the acceptable norm. The indications, contraindications, technique, and results of the above procedure should only be interpreted and judged by a Board-Certified Interventional Pain Specialist with extensive familiarity and expertise in the same exact procedure and technique.

## 2021-04-01 LAB — TOXASSURE SELECT 13 (MW), URINE

## 2021-04-02 ENCOUNTER — Other Ambulatory Visit: Payer: Self-pay

## 2021-04-02 ENCOUNTER — Ambulatory Visit (HOSPITAL_BASED_OUTPATIENT_CLINIC_OR_DEPARTMENT_OTHER): Payer: Medicare Other | Admitting: Pain Medicine

## 2021-04-02 ENCOUNTER — Ambulatory Visit
Admission: RE | Admit: 2021-04-02 | Discharge: 2021-04-02 | Disposition: A | Discharge status: Home / Self Care | Payer: Medicare Other | Source: Ambulatory Visit | Attending: Pain Medicine | Admitting: Pain Medicine

## 2021-04-02 ENCOUNTER — Encounter: Payer: Self-pay | Admitting: Pain Medicine

## 2021-04-02 VITALS — BP 126/70 | HR 82 | Temp 96.6°F | Resp 18 | Ht 65.0 in | Wt 240.0 lb

## 2021-04-02 DIAGNOSIS — M5442 Lumbago with sciatica, left side: Secondary | ICD-10-CM | POA: Diagnosis present

## 2021-04-02 DIAGNOSIS — M4726 Other spondylosis with radiculopathy, lumbar region: Secondary | ICD-10-CM | POA: Insufficient documentation

## 2021-04-02 DIAGNOSIS — M5136 Other intervertebral disc degeneration, lumbar region: Secondary | ICD-10-CM | POA: Diagnosis present

## 2021-04-02 DIAGNOSIS — M5416 Radiculopathy, lumbar region: Secondary | ICD-10-CM | POA: Insufficient documentation

## 2021-04-02 DIAGNOSIS — M5441 Lumbago with sciatica, right side: Secondary | ICD-10-CM

## 2021-04-02 DIAGNOSIS — G8929 Other chronic pain: Secondary | ICD-10-CM | POA: Insufficient documentation

## 2021-04-02 DIAGNOSIS — M961 Postlaminectomy syndrome, not elsewhere classified: Secondary | ICD-10-CM

## 2021-04-02 DIAGNOSIS — M48061 Spinal stenosis, lumbar region without neurogenic claudication: Secondary | ICD-10-CM | POA: Insufficient documentation

## 2021-04-02 MED ORDER — SODIUM CHLORIDE 0.9% FLUSH
2.0000 mL | Freq: Once | INTRAVENOUS | Status: AC
  Administered 2021-04-02: 2 mL

## 2021-04-02 MED ORDER — ROPIVACAINE HCL 2 MG/ML IJ SOLN
2.0000 mL | Freq: Once | INTRAMUSCULAR | Status: AC
  Administered 2021-04-02: 2 mL via EPIDURAL

## 2021-04-02 MED ORDER — IOHEXOL 180 MG/ML  SOLN
10.0000 mL | Freq: Once | INTRAMUSCULAR | Status: AC
  Administered 2021-04-02: 5 mL via EPIDURAL

## 2021-04-02 MED ORDER — ROPIVACAINE HCL 2 MG/ML IJ SOLN
INTRAMUSCULAR | Status: AC
  Filled 2021-04-02: qty 10

## 2021-04-02 MED ORDER — SODIUM CHLORIDE (PF) 0.9 % IJ SOLN
INTRAMUSCULAR | Status: AC
  Filled 2021-04-02: qty 10

## 2021-04-02 MED ORDER — LIDOCAINE HCL URETHRAL/MUCOSAL 2 % EX GEL
CUTANEOUS | Status: AC
  Filled 2021-04-02: qty 5

## 2021-04-02 MED ORDER — TRIAMCINOLONE ACETONIDE 40 MG/ML IJ SUSP
INTRAMUSCULAR | Status: AC
  Filled 2021-04-02: qty 1

## 2021-04-02 MED ORDER — LIDOCAINE HCL 2 % IJ SOLN
20.0000 mL | Freq: Once | INTRAMUSCULAR | Status: AC
  Administered 2021-04-02: 100 mg

## 2021-04-02 MED ORDER — TRIAMCINOLONE ACETONIDE 40 MG/ML IJ SUSP
40.0000 mg | Freq: Once | INTRAMUSCULAR | Status: AC
  Administered 2021-04-02: 40 mg

## 2021-04-02 NOTE — Patient Instructions (Addendum)
Pain Management Discharge Instructions  General Discharge Instructions :  If you need to reach your doctor call: Monday-Friday 8:00 am - 4:00 pm at [phone] or toll free [phone].  After clinic hours [phone] to have operator reach doctor.  Bring all of your medication bottles to all your appointments in the pain clinic.  To cancel or reschedule your appointment with Pain Management please remember to call 24 hours in advance to avoid a fee.  Refer to the educational materials which you have been given on: General Risks, I had my Procedure. Discharge Instructions, Post Sedation.  Post Procedure Instructions:  The drugs you were given will stay in your system until tomorrow, so for the next 24 hours you should not drive, make any legal decisions or drink any alcoholic beverages.  You may eat anything you prefer, but it is better to start with liquids then soups and crackers, and gradually work up to solid foods.  Please notify your doctor immediately if you have any unusual bleeding, trouble breathing or pain that is not related to your normal pain.  Depending on the type of procedure that was done, some parts of your body may feel week and/or numb.  This usually clears up by tonight or the next day.  Walk with the use of an assistive device or accompanied by an adult for the 24 hours.  You may use ice on the affected area for the first 24 hours.  Put ice in a Ziploc bag and cover with a towel and place against area 15 minutes on 15 minutes off.  You may switch to heat after 24 hours.Epidural Steroid Injection Patient Information  Description: The epidural space surrounds the nerves as they exit the spinal cord.  In some patients, the nerves can be compressed and inflamed by a bulging disc or a tight spinal canal (spinal stenosis).  By injecting steroids into the epidural space, we can bring irritated nerves into direct contact with a potentially helpful medication.  These  steroids act directly on the irritated nerves and can reduce swelling and inflammation which often leads to decreased pain.  Epidural steroids may be injected anywhere along the spine and from the neck to the low back depending upon the location of your pain.   After numbing the skin with local anesthetic (like Novocaine), a small needle is passed into the epidural space slowly.  You may experience a sensation of pressure while this is being done.  The entire block usually last less than 10 minutes.  Conditions which may be treated by epidural steroids:  Low back and leg pain Neck and arm pain Spinal stenosis Post-laminectomy syndrome Herpes zoster (shingles) pain Pain from compression fractures  Preparation for the injection:  Do not eat any solid food or dairy products within 8 hours of your appointment.  You may drink clear liquids up to 3 hours before appointment.  Clear liquids include water, black coffee, juice or soda.  No milk or cream please. You may take your regular medication, including pain medications, with a sip of water before your appointment  Diabetics should hold regular insulin (if taken separately) and take 1/2 normal NPH dos the morning of the procedure.  Carry some sugar containing items with you to your appointment. A driver must accompany you and be prepared to drive you home after your procedure.  Bring all your current medications with your. An IV may be inserted and sedation may be given at the discretion of the physician.     A blood pressure cuff, EKG and other monitors will often be applied during the procedure.  Some patients may need to have extra oxygen administered for a short period. You will be asked to provide medical information, including your allergies, prior to the procedure.  We must know immediately if you are taking blood thinners (like Coumadin/Warfarin)  Or if you are allergic to IV iodine contrast (dye). We must know if you could possible be  pregnant.  Possible side-effects: Bleeding from needle site Infection (rare, may require surgery) Nerve injury (rare) Numbness & tingling (temporary) Difficulty urinating (rare, temporary) Spinal headache ( a headache worse with upright posture) Light -headedness (temporary) Pain at injection site (several days) Decreased blood pressure (temporary) Weakness in arm/leg (temporary) Pressure sensation in back/neck (temporary)  Call if you experience: Fever/chills associated with headache or increased back/neck pain. Headache worsened by an upright position. New onset weakness or numbness of an extremity below the injection site Hives or difficulty breathing (go to the emergency room) Inflammation or drainage at the infection site Severe back/neck pain Any new symptoms which are concerning to you  Please note:  Although the local anesthetic injected can often make your back or neck feel good for several hours after the injection, the pain will likely return.  It takes 3-7 days for steroids to work in the epidural space.  You may not notice any pain relief for at least that one week.  If effective, we will often do a series of three injections spaced 3-6 weeks apart to maximally decrease your pain.  After the initial series, we generally will wait several months before considering a repeat injection of the same type.  If you have any questions, please call [phone] [facility]  ____________________________________________________________________________________________  Post-Procedure Discharge Instructions  Instructions: Apply ice:  Purpose: This will minimize any swelling and discomfort after procedure.  When: Day of procedure, as soon as you get home. How: Fill a plastic sandwich bag with crushed ice. Cover it with a small towel and apply to injection site. How long: (15 min on, 15 min off) Apply for 15 minutes then remove x 15 minutes.   Repeat sequence on day of procedure, until you go to bed. Apply heat:  Purpose: To treat any soreness and discomfort from the procedure. When: Starting the next day after the procedure. How: Apply heat to procedure site starting the day following the procedure. How long: May continue to repeat daily, until discomfort goes away. Food intake: Start with clear liquids (like water) and advance to regular food, as tolerated.  Physical activities: Keep activities to a minimum for the first 8 hours after the procedure. After that, then as tolerated. Driving: If you have received any sedation, be responsible and do not drive. You are not allowed to drive for 24 hours after having sedation. Blood thinner: (Applies only to those taking blood thinners) You may restart your blood thinner 6 hours after your procedure. Insulin: (Applies only to Diabetic patients taking insulin) As soon as you can eat, you may resume your normal dosing schedule. Infection prevention: Keep procedure site clean and dry. Shower daily and clean area with soap and water. Post-procedure Pain Diary: Extremely important that this be done correctly and accurately. Recorded information will be used to determine the next step in treatment. For the purpose of accuracy, follow these rules: Evaluate only the area treated. Do not report or include pain from an untreated area. For the purpose of this  evaluation, ignore all other areas of pain, except for the treated area. After your procedure, avoid taking a long nap and attempting to complete the pain diary after you wake up. Instead, set your alarm clock to go off every hour, on the hour, for the initial 8 hours after the procedure. Document the duration of the numbing medicine, and the relief you are getting from it. Do not go to sleep and attempt to complete it later. It will not be accurate. If you received sedation, it is likely that you were given a medication that may cause amnesia. Because  of this, completing the diary at a later time may cause the information to be inaccurate. This information is needed to plan your care. Follow-up appointment: Keep your post-procedure follow-up evaluation appointment after the procedure (usually 2 weeks for most procedures, 6 weeks for radiofrequencies). DO NOT FORGET to bring you pain diary with you.   Expect: (What should I expect to see with my procedure?) From numbing medicine (AKA: Local Anesthetics): Numbness or decrease in pain. You may also experience some weakness, which if present, could last for the duration of the local anesthetic. Onset: Full effect within 15 minutes of injected. Duration: It will depend on the type of local anesthetic used. On the average, 1 to 8 hours.  From steroids (Applies only if steroids were used): Decrease in swelling or inflammation. Once inflammation is improved, relief of the pain will follow. Onset of benefits: Depends on the amount of swelling present. The more swelling, the longer it will take for the benefits to be seen. In some cases, up to 10 days. Duration: Steroids will stay in the system x 2 weeks. Duration of benefits will depend on multiple posibilities including persistent irritating factors. Side-effects: If present, they may typically last 2 weeks (the duration of the steroids). Frequent: Cramps (if they occur, drink Gatorade and take over-the-counter Magnesium 450-500 mg once to twice a day); water retention with temporary weight gain; increases in blood sugar; decreased immune system response; increased appetite. Occasional: Facial flushing (red, warm cheeks); mood swings; menstrual changes. Uncommon: Long-term decrease or suppression of natural hormones; bone thinning. (These are more common with higher doses or more frequent use. This is why we prefer that our patients avoid having any injection therapies in other practices.)  Very Rare: Severe mood changes; psychosis; aseptic necrosis. From  procedure: Some discomfort is to be expected once the numbing medicine wears off. This should be minimal if ice and heat are applied as instructed.  Call if: (When should I call?) You experience numbness and weakness that gets worse with time, as opposed to wearing off. New onset bowel or bladder incontinence. (Applies only to procedures done in the spine)  Emergency Numbers: Durning business hours (Monday - Thursday, 8:00 AM - 4:00 PM) (Friday, 9:00 AM - 12:00 Noon): [phone] After hours: [phone] NOTE: If you are having a problem and are unable connect with, or to talk to a provider, then go to your nearest urgent care or emergency department. If the problem is serious and urgent, please call 911. ____________________________________________________________________________________________  ____________________________________________________________________________________________  Virtual Visits   What is a "Virtual Visit"? It is a Metallurgist (medical visit) that takes place on real time (NOT TEXT or E-MAIL) over the telephone or computer device (desktop, laptop, tablet, smart phone, etc.). It allows for more location flexibility between the patient and the healthcare provider.  Who decides when these types of visits will be used? The  physician.  Who is eligible for these types of visits? Only those patients that can be reliably reached over the telephone.  What do you mean by reliably? We do not have time to call everyone multiple times, therefore those that tend to screen calls and then call back later are not suitable candidates for this system. We understand how people are reluctant to pickup on "unknown" calls, therefore, we suggest adding our telephone numbers to your list of "CONTACT(s)". This way, you should be able to readily identify our calls when you receive one. All of our numbers are available below.   Who is not eligible? This option  is not available for medication management encounters, specially for controlled substances. Patients on pain medications that fall under the category of controlled substances have to come in for "Face-to-Face" encounters. This is required for mandatory monitoring of these substances. You may be asked to provide a sample for an unannounced urine drug screening test (UDS), and we will need to count your pain pills. Not bringing your pills to be counted may result in no refill. Obviously, neither one of these can be done over the phone.  When will this type of visits be used? You can request a virtual visit whenever you are physically unable to attend a regular appointment. The decision will be made by the physician (or healthcare provider) on a case by case basis.   At what time will I be called? This is an excellent question. The providers will try to call you whenever they have time available. Do not expect to be called at any specific time. The secretaries will assign you a time for your virtual visit appointment, but this is done simply to keep a list of those patients that need to be called, but not for the purpose of keeping a time schedule. Be advised that the call may come in anytime during the day, between the hours of 8:00 AM and 8::00 PM, depending on provider availability. We do understand that the system is not perfect. If you are unable to be available that day on a moments notice, then request an "in-person" appointment rather than a "virtual visit".  Can I request my medication visits to be "Virtual"? Yes you may request it, but the decision is entirely up to the healthcare provider. Control substances require specific monitoring that requires Face-to-Face encounters. The number of encounters  and the extent of the monitoring is determined on a case by case basis.  Add a new contact to your smart phone and label it "PAIN CLINIC" Under this contact add the following numbers: Main: [phone]  [phone] (Official Contact Number) Nurses: [phone] (These are outgoing only calling systems. Do not call this number.) Dr. Dossie Arbour: [phone] or [phone] (Outgoing calls only. Do not call this number.)  ____________________________________________________________________________________________

## 2021-04-03 ENCOUNTER — Telehealth: Payer: Self-pay

## 2021-04-03 NOTE — Telephone Encounter (Signed)
Post procedure phone call.  LM 

## 2021-05-02 ENCOUNTER — Other Ambulatory Visit: Payer: Self-pay | Admitting: General Surgery

## 2021-05-02 ENCOUNTER — Telehealth: Payer: Self-pay

## 2021-05-02 NOTE — Telephone Encounter (Signed)
Lm for patient to call office for pre virtual appointment questions.  
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
## 2021-05-06 ENCOUNTER — Other Ambulatory Visit: Payer: Self-pay

## 2021-05-06 ENCOUNTER — Ambulatory Visit: Payer: Medicare Other | Attending: Pain Medicine | Admitting: Pain Medicine

## 2021-05-06 ENCOUNTER — Encounter: Payer: Self-pay | Admitting: Pain Medicine

## 2021-05-06 DIAGNOSIS — G894 Chronic pain syndrome: Secondary | ICD-10-CM

## 2021-05-06 DIAGNOSIS — M5416 Radiculopathy, lumbar region: Secondary | ICD-10-CM

## 2021-05-06 DIAGNOSIS — M5442 Lumbago with sciatica, left side: Secondary | ICD-10-CM

## 2021-05-06 DIAGNOSIS — M961 Postlaminectomy syndrome, not elsewhere classified: Secondary | ICD-10-CM | POA: Diagnosis not present

## 2021-05-06 DIAGNOSIS — M792 Neuralgia and neuritis, unspecified: Secondary | ICD-10-CM

## 2021-05-06 DIAGNOSIS — M5441 Lumbago with sciatica, right side: Secondary | ICD-10-CM

## 2021-05-06 DIAGNOSIS — G8929 Other chronic pain: Secondary | ICD-10-CM

## 2021-05-06 DIAGNOSIS — M797 Fibromyalgia: Secondary | ICD-10-CM

## 2021-05-06 LAB — SURGICAL PATHOLOGY

## 2021-05-06 MED ORDER — GABAPENTIN 300 MG PO CAPS: 300.0000 mg | ORAL_CAPSULE | Freq: Every day | ORAL | 1 refills | Status: DC

## 2021-06-19 ENCOUNTER — Ambulatory Visit (INDEPENDENT_AMBULATORY_CARE_PROVIDER_SITE_OTHER): Payer: Medicare Other

## 2021-06-19 ENCOUNTER — Other Ambulatory Visit: Payer: Self-pay

## 2021-06-19 DIAGNOSIS — G4733 Obstructive sleep apnea (adult) (pediatric): Secondary | ICD-10-CM | POA: Diagnosis not present

## 2021-06-19 MED ORDER — IPRATROPIUM-ALBUTEROL 0.5-2.5 (3) MG/3ML IN SOLN: 3.0000 mL | Freq: Four times a day (QID) | RESPIRATORY_TRACT | 3 refills | Status: DC | PRN

## 2021-06-19 NOTE — Progress Notes (Signed)
95 percentile pressure 11.3   95th percentile leak 0.7   apnea index 0.7 /hr  apnea-hypopnea index  0.7 /hr   total days used  >4 hr 83 days  total days used <4 hr 7 days  Total compliance 92 percent  Doing great on cpap-  had neb meds ordered thru [facility].   Pt was seen by Claiborne Billings from [facility]

## 2021-07-16 ENCOUNTER — Ambulatory Visit: Payer: Medicare Other | Admitting: Internal Medicine

## 2021-07-18 ENCOUNTER — Encounter: Payer: Self-pay | Admitting: Internal Medicine

## 2021-07-18 ENCOUNTER — Ambulatory Visit (INDEPENDENT_AMBULATORY_CARE_PROVIDER_SITE_OTHER): Payer: Medicare Other | Admitting: Internal Medicine

## 2021-07-18 ENCOUNTER — Other Ambulatory Visit: Payer: Self-pay

## 2021-07-18 VITALS — BP 134/80 | HR 56 | Temp 98.0°F | Resp 16 | Ht 65.0 in | Wt 243.6 lb

## 2021-07-18 DIAGNOSIS — J449 Chronic obstructive pulmonary disease, unspecified: Secondary | ICD-10-CM | POA: Diagnosis not present

## 2021-07-18 DIAGNOSIS — Z7189 Other specified counseling: Secondary | ICD-10-CM | POA: Diagnosis not present

## 2021-07-18 DIAGNOSIS — G4733 Obstructive sleep apnea (adult) (pediatric): Secondary | ICD-10-CM
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
## 2021-07-18 NOTE — Patient Instructions (Signed)
Sleep Apnea Sleep apnea affects breathing during sleep. It causes breathing to stop for 10 seconds or more, or to become shallow. People with sleep apnea usually snore loudly. It can also increase the risk of: Heart attack. Stroke. Being very overweight (obese). Diabetes. Heart failure. Irregular heartbeat. High blood pressure. The goal of treatment is to help you breathe normally again. What are the causes? The most common cause of this condition is a collapsed or blocked airway. There are three kinds of sleep apnea: Obstructive sleep apnea. This is caused by a blocked or collapsed airway. Central sleep apnea. This happens when the brain does not send the right signals to the muscles that control breathing. Mixed sleep apnea. This is a combination of obstructive and central sleep apnea. What increases the risk? Being overweight. Smoking. Having a small airway. Being older. Being female. Drinking alcohol. Taking medicines to calm yourself (sedatives or tranquilizers). Having family members with the condition. Having a tongue or tonsils that are larger than normal. What are the signs or symptoms? Trouble staying asleep. Loud snoring. Headaches in the morning. Waking up gasping. Dry mouth or sore throat in the morning. Being sleepy or tired during the day. If you are sleepy or tired during the day, you may also: Not be able to focus your mind (concentrate). Forget things. Get angry a lot and have mood swings. Feel sad (depressed). Have changes in your personality. Have less interest in sex, if you are female. Be unable to have an erection, if you are female. How is this treated?  Sleeping on your side. Using a medicine to get rid of mucus in your nose (decongestant). Avoiding the use of alcohol, medicines to help you relax, or certain pain medicines (narcotics). Losing weight, if needed. Changing your diet. Quitting smoking. Using a machine to open your airway while you  sleep, such as: An oral appliance. This is a mouthpiece that shifts your lower jaw forward. A CPAP device. This device blows air through a mask when you breathe out (exhale). An EPAP device. This has valves that you put in each nostril. A BIPAP device. This device blows air through a mask when you breathe in (inhale) and breathe out. Having surgery if other treatments do not work. Follow these instructions at home: Lifestyle Make changes that your doctor recommends. Eat a healthy diet. Lose weight if needed. Avoid alcohol, medicines to help you relax, and some pain medicines. Do not smoke or use any products that contain nicotine or tobacco. If you need help quitting, ask your doctor. General instructions Take over-the-counter and prescription medicines only as told by your doctor. If you were given a machine to use while you sleep, use it only as told by your doctor. If you are having surgery, make sure to tell your doctor you have sleep apnea. You may need to bring your device with you. Keep all follow-up visits. Contact a doctor if: The machine that you were given to use during sleep bothers you or does not seem to be working. You do not get better. You get worse. Get help right away if: Your chest hurts. You have trouble breathing in enough air. You have an uncomfortable feeling in your back, arms, or stomach. You have trouble talking. One side of your body feels weak. A part of your face is hanging down. These symptoms may be an emergency. Get help right away. Call your local emergency services (911 in the [place] Do not wait to see if the symptoms  will go away. Do not drive yourself to the hospital. Summary This condition affects breathing during sleep. The most common cause is a collapsed or blocked airway. The goal of treatment is to help you breathe normally while you sleep. This information is not intended to replace advice given to you by your health care provider. Make  sure you discuss any questions you have with your health care provider. Document Revised: 03/13/2021 Document Reviewed: 07/13/2020 Elsevier Patient Education  2022 [employer]. Chronic Obstructive Pulmonary Disease Chronic obstructive pulmonary disease (COPD) is a long-term (chronic) lung problem. When you have COPD, it is hard for air to get in and out of your lungs. Usually the condition gets worse over time, and your lungs will never return to normal. There are things you can do to keep yourself as healthy as possible. What are the causes? Smoking. This is the most common cause. Certain genes passed from parent to child (inherited). What increases the risk? Being exposed to secondhand smoke from cigarettes, pipes, or cigars. Being exposed to chemicals and other irritants, such as fumes and dust in the work environment. Having chronic lung conditions or infections. What are the signs or symptoms? Shortness of breath, especially during physical activity. A long-term cough with a large amount of thick mucus. Sometimes, the cough may not have any mucus (dry cough). Wheezing. Breathing quickly. Skin that looks gray or blue, especially in the fingers, toes, or lips. Feeling tired (fatigue). Weight loss. Chest tightness. Having infections often. Episodes when breathing symptoms become much worse (exacerbations). At the later stages of this disease, you may have swelling in the ankles, feet, or legs. How is this treated? Taking medicines. Quitting smoking, if you smoke. Rehabilitation. This includes steps to make your body work better. It may involve a team of specialists. Doing exercises. Making changes to your diet. Using oxygen. Lung surgery. Lung transplant. Comfort measures (palliative care). Follow these instructions at home: Medicines Take over-the-counter and prescription medicines only as told by your doctor. Talk to your doctor before taking any cough or allergy  medicines. You may need to avoid medicines that cause your lungs to be dry. Lifestyle If you smoke, stop smoking. Smoking makes the problem worse. Do not smoke or use any products that contain nicotine or tobacco. If you need help quitting, ask your doctor. Avoid being around things that make your breathing worse. This may include smoke, chemicals, and fumes. Stay active, but remember to rest as well. Learn and use tips on how to manage stress and control your breathing. Make sure you get enough sleep. Most adults need at least 7 hours of sleep every night. Eat healthy foods. Eat smaller meals more often. Rest before meals. Controlled breathing Learn and use tips on how to control your breathing as told by your doctor. Try: Breathing in (inhaling) through your nose for 1 second. Then, pucker your lips and breath out (exhale) through your lips for 2 seconds. Putting one hand on your belly (abdomen). Breathe in slowly through your nose for 1 second. Your hand on your belly should move out. Pucker your lips and breathe out slowly through your lips. Your hand on your belly should move in as you breathe out.  Controlled coughing Learn and use controlled coughing to clear mucus from your lungs. Follow these steps: Lean your head a little forward. Breathe in deeply. Try to hold your breath for 3 seconds. Keep your mouth slightly open while coughing 2 times. Spit any mucus out into  a tissue. Rest and do the steps again 1 or 2 times as needed. General instructions Make sure you get all the shots (vaccines) that your doctor recommends. Ask your doctor about a flu shot and a pneumonia shot. Use oxygen therapy and pulmonary rehabilitation if told by your doctor. If you need home oxygen therapy, ask your doctor if you should buy a tool to measure your oxygen level (oximeter). Make a COPD action plan with your doctor. This helps you to know what to do if you feel worse than usual. Manage any other  conditions you have as told by your doctor. Avoid going outside when it is very hot, cold, or humid. Avoid people who have a sickness you can catch (contagious). Keep all follow-up visits. Contact a doctor if: You cough up more mucus than usual. There is a change in the color or thickness of the mucus. It is harder to breathe than usual. Your breathing is faster than usual. You have trouble sleeping. You need to use your medicines more often than usual. You have trouble doing your normal activities such as getting dressed or walking around the house. Get help right away if: You have shortness of breath while resting. You have shortness of breath that stops you from: Being able to talk. Doing normal activities. Your chest hurts for longer than 5 minutes. Your skin color is more blue than usual. Your pulse oximeter shows that you have low oxygen for longer than 5 minutes. You have a fever. You feel too tired to breathe normally. These symptoms may represent a serious problem that is an emergency. Do not wait to see if the symptoms will go away. Get medical help right away. Call your local emergency services (911 in the [place] Do not drive yourself to the hospital. Summary Chronic obstructive pulmonary disease (COPD) is a long-term lung problem. The way your lungs work will never return to normal. Usually the condition gets worse over time. There are things you can do to keep yourself as healthy as possible. Take over-the-counter and prescription medicines only as told by your doctor. If you smoke, stop. Smoking makes the problem worse. This information is not intended to replace advice given to you by your health care provider. Make sure you discuss any questions you have with your health care provider. Document Revised: 06/12/2020 Document Reviewed: 06/12/2020 Elsevier Patient Education  2022 [employer].

## 2021-07-24 ENCOUNTER — Telehealth: Payer: Self-pay

## 2021-07-24 NOTE — Telephone Encounter (Signed)
Oxygen therapy order signed by provider and placed in [facility] folder. 

## 2021-08-19 ENCOUNTER — Other Ambulatory Visit: Payer: Self-pay | Admitting: Internal Medicine

## 2021-08-22 ENCOUNTER — Telehealth: Payer: Self-pay

## 2021-08-22 NOTE — Telephone Encounter (Signed)
Faxed prescription from [facility] for nondisposable nebulizer set, admin set, small vol disposable neb, and aerosol mask used with nebulizer on 08/22/21

## 2021-09-17 NOTE — Progress Notes (Deleted)
Canceled due to COVID

## 2021-09-18 ENCOUNTER — Encounter: Admitting: Pain Medicine

## 2021-09-18 ENCOUNTER — Telehealth: Payer: Self-pay | Admitting: Pain Medicine

## 2021-09-18 NOTE — Telephone Encounter (Signed)
Patient called regarding appt for today. Her son that she lives with is +Covid. She says she has enough meds scripts to fill this month and next month. Rescheduled to 11-11-21

## 2021-09-26 ENCOUNTER — Other Ambulatory Visit: Payer: Self-pay | Admitting: Pain Medicine

## 2021-09-26 DIAGNOSIS — Z79891 Long term (current) use of opiate analgesic: Secondary | ICD-10-CM

## 2021-09-26 DIAGNOSIS — Z79899 Other long term (current) drug therapy: Secondary | ICD-10-CM

## 2021-09-26 DIAGNOSIS — G894 Chronic pain syndrome: Secondary | ICD-10-CM

## 2021-09-26 DIAGNOSIS — M542 Cervicalgia: Secondary | ICD-10-CM

## 2021-09-26 DIAGNOSIS — G4486 Cervicogenic headache: Secondary | ICD-10-CM

## 2021-09-26 DIAGNOSIS — M25512 Pain in left shoulder: Secondary | ICD-10-CM

## 2021-09-26 DIAGNOSIS — R0781 Pleurodynia: Secondary | ICD-10-CM

## 2021-09-26 DIAGNOSIS — G8929 Other chronic pain: Secondary | ICD-10-CM

## 2021-09-30 ENCOUNTER — Telehealth: Payer: Self-pay | Admitting: Pain Medicine

## 2021-09-30 NOTE — Telephone Encounter (Signed)
NO PHONE REFILLS. Patient called and informed that she will need to get an earlier appointment. Understands instructions.
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
## 2021-10-02 ENCOUNTER — Ambulatory Visit: Payer: Medicare Other | Attending: Pain Medicine | Admitting: Pain Medicine

## 2021-10-02 ENCOUNTER — Other Ambulatory Visit: Payer: Self-pay

## 2021-10-02 VITALS — BP 159/90 | HR 85 | Temp 97.3°F | Resp 16 | Ht 64.0 in | Wt 240.0 lb

## 2021-10-02 DIAGNOSIS — G4486 Cervicogenic headache: Secondary | ICD-10-CM | POA: Insufficient documentation

## 2021-10-02 DIAGNOSIS — M545 Low back pain, unspecified: Secondary | ICD-10-CM | POA: Insufficient documentation

## 2021-10-02 DIAGNOSIS — G8929 Other chronic pain: Secondary | ICD-10-CM | POA: Insufficient documentation

## 2021-10-02 DIAGNOSIS — Z79899 Other long term (current) drug therapy: Secondary | ICD-10-CM | POA: Insufficient documentation

## 2021-10-02 DIAGNOSIS — M4726 Other spondylosis with radiculopathy, lumbar region: Secondary | ICD-10-CM | POA: Insufficient documentation

## 2021-10-02 DIAGNOSIS — M961 Postlaminectomy syndrome, not elsewhere classified: Secondary | ICD-10-CM | POA: Diagnosis not present

## 2021-10-02 DIAGNOSIS — Z79891 Long term (current) use of opiate analgesic: Secondary | ICD-10-CM | POA: Insufficient documentation

## 2021-10-02 DIAGNOSIS — M25512 Pain in left shoulder: Secondary | ICD-10-CM | POA: Diagnosis present

## 2021-10-02 DIAGNOSIS — M542 Cervicalgia: Secondary | ICD-10-CM | POA: Insufficient documentation

## 2021-10-02 DIAGNOSIS — M25511 Pain in right shoulder: Secondary | ICD-10-CM | POA: Diagnosis present

## 2021-10-02 DIAGNOSIS — M5136 Other intervertebral disc degeneration, lumbar region: Secondary | ICD-10-CM | POA: Insufficient documentation

## 2021-10-02 DIAGNOSIS — R0781 Pleurodynia: Secondary | ICD-10-CM | POA: Insufficient documentation

## 2021-10-02 DIAGNOSIS — G894 Chronic pain syndrome: Secondary | ICD-10-CM | POA: Diagnosis present

## 2021-10-02 DIAGNOSIS — M48061 Spinal stenosis, lumbar region without neurogenic claudication: Secondary | ICD-10-CM | POA: Insufficient documentation

## 2021-10-02 MED ORDER — NALOXONE HCL 4 MG/0.1ML NA LIQD: 1.0000 | NASAL | 0 refills | Status: DC | PRN

## 2021-10-02 MED ORDER — TRAMADOL HCL 50 MG PO TABS: 100.0000 mg | ORAL_TABLET | Freq: Four times a day (QID) | ORAL | 5 refills | Status: DC

## 2021-10-02 NOTE — Progress Notes (Signed)
Nursing Pain Medication Assessment:  Safety precautions to be maintained throughout the outpatient stay will include: orient to surroundings, keep bed in low position, maintain call bell within reach at all times, provide assistance with transfer out of bed and ambulation.  Medication Inspection Compliance: Pill count conducted under aseptic conditions, in front of the patient. Neither the pills nor the bottle was removed from the patient's sight at any time. Once count was completed pills were immediately returned to the patient in their original bottle.  Medication: Tramadol (Ultram) Pill/Patch Count:  23 of 240 pills remain Pill/Patch Appearance: Markings consistent with prescribed medication Bottle Appearance: Standard pharmacy container. Clearly labeled. Filled Date:  Pills in wrong bottle, Date not correct Last Medication intake:  Today

## 2021-10-02 NOTE — Patient Instructions (Signed)
______________________________________________________________________ ° °Preparing for Procedure with Sedation ° °NOTICE: Due to recent regulatory changes, starting on March 18, 2021, procedures requiring intravenous (IV) sedation will no longer be performed at the [facility].  These types of procedures are required to be performed at [facility] ambulatory surgery facility.  We are very sorry for the inconvenience. ° °Procedure appointments are limited to planned procedures: °No Prescription Refills. °No disability issues will be discussed. °No medication changes will be discussed. ° °Instructions: °Oral Intake: Do not eat or drink anything for at least 8 hours prior to your procedure. (Exception: Blood Pressure Medication. See below.) °Transportation: A driver is required. You may not drive yourself after the procedure. °Blood Pressure Medicine: Do not forget to take your blood pressure medicine with a sip of water the morning of the procedure. If your Diastolic (lower reading) is above 100 mmHg, elective cases will be cancelled/rescheduled. °Blood thinners: These will need to be stopped for procedures. Notify our staff if you are taking any blood thinners. Depending on which one you take, there will be specific instructions on how and when to stop it. °Diabetics on insulin: Notify the staff so that you can be scheduled 1st case in the morning. If your diabetes requires high dose insulin, take only ½ of your normal insulin dose the morning of the procedure and notify the staff that you have done so. °Preventing infections: Shower with an antibacterial soap the morning of your procedure. °Build-up your immune system: Take 1000 mg of Vitamin C with every meal (3 times a day) the day prior to your procedure. °Antibiotics: Inform the staff if you have a condition or reason that requires you to take antibiotics before dental procedures. °Pregnancy: If you are pregnant, call and cancel the procedure. °Sickness: If  you have a cold, fever, or any active infections, call and cancel the procedure. °Arrival: You must be in the facility at least 30 minutes prior to your scheduled procedure. °Children: Do not bring children with you. °Dress appropriately: Bring dark clothing that you would not mind if they get stained. °Valuables: Do not bring any jewelry or valuables. ° °Reasons to call and reschedule or cancel your procedure: (Following these recommendations will minimize the risk of a serious complication.) °Surgeries: Avoid having procedures within 2 weeks of any surgery. (Avoid for 2 weeks before or after any surgery). °Flu Shots: Avoid having procedures within 2 weeks of a flu shots. (Avoid for 2 weeks before or after immunizations). °Barium: Avoid having a procedure within 7-10 days after having had a radiological study involving the use of radiological contrast. (Myelograms, Barium swallow or enema study). °Heart attacks: Avoid any elective procedures or surgeries for the initial 6 months after a "Myocardial Infarction" (Heart Attack). °Blood thinners: It is imperative that you stop these medications before procedures. Let us know if you if you take any blood thinner.  °Infection: Avoid procedures during or within two weeks of an infection (including chest colds or gastrointestinal problems). Symptoms associated with infections include: Localized redness, fever, chills, night sweats or profuse sweating, burning sensation when voiding, cough, congestion, stuffiness, runny nose, sore throat, diarrhea, nausea, vomiting, cold or Flu symptoms, recent or current infections. It is specially important if the infection is over the area that we intend to treat. °Heart and lung problems: Symptoms that may suggest an active cardiopulmonary problem include: cough, chest pain, breathing difficulties or shortness of breath, dizziness, ankle swelling, uncontrolled high or unusually low blood pressure, and/or palpitations. If you are    experiencing any of these symptoms, cancel your procedure and contact your primary care physician for an evaluation. ° °Remember:  °Regular Business hours are:  °Monday to Thursday 8:00 AM to 4:00 PM ° °Provider's Schedule: °Zeina Akkerman, MD:  °Procedure days: Tuesday and Thursday 7:30 AM to 4:00 PM ° °Bilal Lateef, MD:  °Procedure days: Monday and Wednesday 7:30 AM to 4:00 PM °______________________________________________________________________ ° ____________________________________________________________________________________________ ° °General Risks and Possible Complications ° °Patient Responsibilities: It is important that you read this as it is part of your informed consent. It is our duty to inform you of the risks and possible complications associated with treatments offered to you. It is your responsibility as a patient to read this and to ask questions about anything that is not clear or that you believe was not covered in this document. ° °Patient’s Rights: You have the right to refuse treatment. You also have the right to change your mind, even after initially having agreed to have the treatment done. However, under this last option, if you wait until the last second to change your mind, you may be charged for the materials used up to that point. ° °Introduction: Medicine is not an exact science. Everything in Medicine, including the lack of treatment(s), carries the potential for danger, harm, or loss (which is by definition: Risk). In Medicine, a complication is a secondary problem, condition, or disease that can aggravate an already existing one. All treatments carry the risk of possible complications. The fact that a side effects or complications occurs, does not imply that the treatment was conducted incorrectly. It must be clearly understood that these can happen even when everything is done following the highest safety standards. ° °No treatment: You can choose not to proceed with the  proposed treatment alternative. The “PRO(s)” would include: avoiding the risk of complications associated with the therapy. The “CON(s)” would include: not getting any of the treatment benefits. These benefits fall under one of three categories: diagnostic; therapeutic; and/or palliative. Diagnostic benefits include: getting information which can ultimately lead to improvement of the disease or symptom(s). Therapeutic benefits are those associated with the successful treatment of the disease. Finally, palliative benefits are those related to the decrease of the primary symptoms, without necessarily curing the condition (example: decreasing the pain from a flare-up of a chronic condition, such as incurable terminal cancer). ° °General Risks and Complications: These are associated to most interventional treatments. They can occur alone, or in combination. They fall under one of the following six (6) categories: no benefit or worsening of symptoms; bleeding; infection; nerve damage; allergic reactions; and/or death. °No benefits or worsening of symptoms: In Medicine there are no guarantees, only probabilities. No healthcare provider can ever guarantee that a medical treatment will work, they can only state the probability that it may. Furthermore, there is always the possibility that the condition may worsen, either directly, or indirectly, as a consequence of the treatment. °Bleeding: This is more common if the patient is taking a blood thinner, either prescription or over the counter (example: Goody Powders, Fish oil, Aspirin, Garlic, etc.), or if suffering a condition associated with impaired coagulation (example: Hemophilia, cirrhosis of the liver, low platelet counts, etc.). However, even if you do not have one on these, it can still happen. If you have any of these conditions, or take one of these drugs, make sure to notify your treating physician. °Infection: This is more common in patients with a compromised  immune system, either due to disease (example:   diabetes, cancer, human immunodeficiency virus [HIV], etc.), or due to medications or treatments (example: therapies used to treat cancer and rheumatological diseases). However, even if you do not have one on these, it can still happen. If you have any of these conditions, or take one of these drugs, make sure to notify your treating physician. Nerve Damage: This is more common when the treatment is an invasive one, but it can also happen with the use of medications, such as those used in the treatment of cancer. The damage can occur to small secondary nerves, or to large primary ones, such as those in the spinal cord and brain. This damage may be temporary or permanent and it may lead to impairments that can range from temporary numbness to permanent paralysis and/or brain death. Allergic Reactions: Any time a substance or material comes in contact with our body, there is the possibility of an allergic reaction. These can range from a mild skin rash (contact dermatitis) to a severe systemic reaction (anaphylactic reaction), which can result in death. Death: In general, any medical intervention can result in death, most of the time due to an unforeseen complication. ____________________________________________________________________________________________ ____________________________________________________________________________________________  Medication Rules  Purpose: To inform patients, and their family members, of our rules and regulations.  Applies to: All patients receiving prescriptions (written or electronic).  Pharmacy of record: Pharmacy where electronic prescriptions will be sent. If written prescriptions are taken to a different pharmacy, please inform the nursing staff. The pharmacy listed in the electronic medical record should be the one where you would like electronic prescriptions to be sent.  Electronic prescriptions: In compliance  with the [place] (STOP) Act of 2017 (Session Lanny Cramp [phone]), effective August 18, 2018, all controlled substances must be electronically prescribed. Calling prescriptions to the pharmacy will cease to exist.  Prescription refills: Only during scheduled appointments. Applies to all prescriptions.  NOTE: The following applies primarily to controlled substances (Opioid* Pain Medications).   Type of encounter (visit): For patients receiving controlled substances, face-to-face visits are required. (Not an option or up to the patient.)  Patient's responsibilities: Pain Pills: Bring all pain pills to every appointment (except for procedure appointments). Pill Bottles: Bring pills in original pharmacy bottle. Always bring the newest bottle. Bring bottle, even if empty. Medication refills: You are responsible for knowing and keeping track of what medications you take and those you need refilled. The day before your appointment: write a list of all prescriptions that need to be refilled. The day of the appointment: give the list to the admitting nurse. Prescriptions will be written only during appointments. No prescriptions will be written on procedure days. If you forget a medication: it will not be "Called in", "Faxed", or "electronically sent". You will need to get another appointment to get these prescribed. No early refills. Do not call asking to have your prescription filled early. Prescription Accuracy: You are responsible for carefully inspecting your prescriptions before leaving our office. Have the discharge nurse carefully go over each prescription with you, before taking them home. Make sure that your name is accurately spelled, that your address is correct. Check the name and dose of your medication to make sure it is accurate. Check the number of pills, and the written instructions to make sure they are clear and accurate. Make sure that you are given  enough medication to last until your next medication refill appointment. Taking Medication: Take medication as prescribed. When it comes to controlled substances, taking less  pills or less frequently than prescribed is permitted and encouraged. Never take more pills than instructed. Never take medication more frequently than prescribed.  Inform other Doctors: Always inform, all of your healthcare providers, of all the medications you take. Pain Medication from other Providers: You are not allowed to accept any additional pain medication from any other Doctor or Healthcare provider. There are two exceptions to this rule. (see below) In the event that you require additional pain medication, you are responsible for notifying us, as stated below. Cough Medicine: Often these contain an opioid, such as codeine or hydrocodone. Never accept or take cough medicine containing these opioids if you are already taking an opioid* medication. The combination may cause respiratory failure and death. Medication Agreement: You are responsible for carefully reading and following our Medication Agreement. This must be signed before receiving any prescriptions from our practice. Safely store a copy of your signed Agreement. Violations to the Agreement will result in no further prescriptions. (Additional copies of our Medication Agreement are available upon request.) Laws, Rules, & Regulations: All patients are expected to follow all Federal and [employer], [employer], Rules, [employer]. Ignorance of the Laws does not constitute a valid excuse.  Illegal drugs and Controlled Substances: The use of illegal substances (including, but not limited to marijuana and its derivatives) and/or the illegal use of any controlled substances is strictly prohibited. Violation of this rule may result in the immediate and permanent discontinuation of any and all prescriptions being written by our practice. The use of any illegal substances is  prohibited. Adopted CDC guidelines & recommendations: Target dosing levels will be at or below 60 MME/day. Use of benzodiazepines** is not recommended.  Exceptions: There are only two exceptions to the rule of not receiving pain medications from other Healthcare Providers. Exception #1 (Emergencies): In the event of an emergency (i.e.: accident requiring emergency care), you are allowed to receive additional pain medication. However, you are responsible for: As soon as you are able, call our office [phone], at any time of the day or night, and leave a message stating your name, the date and nature of the emergency, and the name and dose of the medication prescribed. In the event that your call is answered by a member of our staff, make sure to document and save the date, time, and the name of the person that took your information.  Exception #2 (Planned Surgery): In the event that you are scheduled by another doctor or dentist to have any type of surgery or procedure, you are allowed (for a period no longer than 30 days), to receive additional pain medication, for the acute post-op pain. However, in this case, you are responsible for picking up a copy of our "Post-op Pain Management for Surgeons" handout, and giving it to your surgeon or dentist. This document is available at our office, and does not require an appointment to obtain it. Simply go to our office during business hours (Monday-Thursday from 8:00 AM to 4:00 PM) (Friday 8:00 AM to 12:00 Noon) or if you have a scheduled appointment with [place], prior to your surgery, and ask for it by name. In addition, you are responsible for: calling our office [phone], at any time of the day or night, and leaving a message stating your name, name of your surgeon, type of surgery, and date of procedure or surgery. Failure to comply with your responsibilities may result in termination of therapy involving the controlled substances. Medication Agreement  Violation.  Following the above rules, including your responsibilities will help you in avoiding a Medication Agreement Violation (Breaking your Pain Medication Contract).  *Opioid medications include: morphine, codeine, oxycodone, oxymorphone, hydrocodone, hydromorphone, meperidine, tramadol, tapentadol, buprenorphine, fentanyl, methadone. **Benzodiazepine medications include: diazepam (Valium), alprazolam (Xanax), clonazepam (Klonopine), lorazepam (Ativan), clorazepate (Tranxene), chlordiazepoxide (Librium), estazolam (Prosom), oxazepam (Serax), temazepam (Restoril), triazolam (Halcion) (Last updated: 05/15/2021) ____________________________________________________________________________________________  ____________________________________________________________________________________________  Medication Recommendations and Reminders  Applies to: All patients receiving prescriptions (written and/or electronic).  Medication Rules & Regulations: These rules and regulations exist for your safety and that of others. They are not flexible and neither are we. Dismissing or ignoring them will be considered "non-compliance" with medication therapy, resulting in complete and irreversible termination of such therapy. (See document titled "Medication Rules" for more details.) In all conscience, because of safety reasons, we cannot continue providing a therapy where the patient does not follow instructions.  Pharmacy of record:  Definition: This is the pharmacy where your electronic prescriptions will be sent.  We do not endorse any particular pharmacy, however, we have experienced problems with [facility] not securing enough medication supply for the community. We do not restrict you in your choice of pharmacy. However, once we write for your prescriptions, we will NOT be re-sending more prescriptions to fix restricted supply problems created by your pharmacy, or your insurance.  The pharmacy listed in  the electronic medical record should be the one where you want electronic prescriptions to be sent. If you choose to change pharmacy, simply notify our nursing staff.  Recommendations: Keep all of your pain medications in a safe place, under lock and key, even if you live alone. We will NOT replace lost, stolen, or damaged medication. After you fill your prescription, take 1 week's worth of pills and put them away in a safe place. You should keep a separate, properly labeled bottle for this purpose. The remainder should be kept in the original bottle. Use this as your primary supply, until it runs out. Once it's gone, then you know that you have 1 week's worth of medicine, and it is time to come in for a prescription refill. If you do this correctly, it is unlikely that you will ever run out of medicine. To make sure that the above recommendation works, it is very important that you make sure your medication refill appointments are scheduled at least 1 week before you run out of medicine. To do this in an effective manner, make sure that you do not leave the office without scheduling your next medication management appointment. Always ask the nursing staff to show you in your prescription , when your medication will be running out. Then arrange for the receptionist to get you a return appointment, at least 7 days before you run out of medicine. Do not wait until you have 1 or 2 pills left, to come in. This is very poor planning and does not take into consideration that we may need to cancel appointments due to bad weather, sickness, or emergencies affecting our staff. DO NOT ACCEPT A "Partial Fill": If for any reason your pharmacy does not have enough pills/tablets to completely fill or refill your prescription, do not allow for a "partial fill". The law allows the pharmacy to complete that prescription within 72 hours, without requiring a new prescription. If they do not fill the rest of your prescription  within those 72 hours, you will need a separate prescription to fill the remaining amount, which we will NOT provide. If  the reason for the partial fill is your insurance, you will need to talk to the pharmacist about payment alternatives for the remaining tablets, but again, DO NOT ACCEPT A PARTIAL FILL, unless you can trust your pharmacist to obtain the remainder of the pills within 72 hours.  Prescription refills and/or changes in medication(s):  Prescription refills, and/or changes in dose or medication, will be conducted only during scheduled medication management appointments. (Applies to both, written and electronic prescriptions.) No refills on procedure days. No medication will be changed or started on procedure days. No changes, adjustments, and/or refills will be conducted on a procedure day. Doing so will interfere with the diagnostic portion of the procedure. No phone refills. No medications will be "called into the pharmacy". No Fax refills. No weekend refills. No Holliday refills. No after hours refills.  Remember:  Business hours are:  Monday to Thursday 8:00 AM to 4:00 PM Provider's Schedule: Milinda Pointer, MD - Appointments are:  Medication management: Monday and Wednesday 8:00 AM to 4:00 PM Procedure day: Tuesday and Thursday 7:30 AM to 4:00 PM Gillis Santa, MD - Appointments are:  Medication management: Tuesday and Thursday 8:00 AM to 4:00 PM Procedure day: Monday and Wednesday 7:30 AM to 4:00 PM (Last update: 03/07/2020) ____________________________________________________________________________________________  ____________________________________________________________________________________________  CBD (cannabidiol) & Delta-8 (Delta-8 tetrahydrocannabinol) WARNING  Intro: Cannabidiol (CBD) and tetrahydrocannabinol (THC), are two natural compounds found in plants of the Cannabis genus. They can both be extracted from hemp or cannabis. Hemp and cannabis come  from the Cannabis sativa plant. Both compounds interact with your bodys endocannabinoid system, but they have very different effects. CBD does not produce the high sensation associated with cannabis. Delta-8 tetrahydrocannabinol, also known as delta-8 THC, is a psychoactive substance found in the Cannabis sativa plant, of which marijuana and hemp are two varieties. THC is responsible for the high associated with the illicit use of marijuana.  Applicable to: All individuals currently taking or considering taking CBD (cannabidiol) and, more important, all patients taking opioid analgesic controlled substances (pain medication). (Example: oxycodone; oxymorphone; hydrocodone; hydromorphone; morphine; methadone; tramadol; tapentadol; fentanyl; buprenorphine; butorphanol; dextromethorphan; meperidine; codeine; etc.)  Legal status: CBD remains a Schedule I drug prohibited for any use. CBD is illegal with one exception. In the [place], CBD has a limited Transport planner (FDA) approval for the treatment of two specific types of epilepsy disorders. Only one CBD product has been approved by the FDA for this purpose: "Epidiolex". FDA is aware that some companies are marketing products containing cannabis and cannabis-derived compounds in ways that violate the [employer], Drug and Cosmetic Act [facility] Act) and that may put the health and safety of consumers at risk. The FDA, a Federal agency, has not enforced the CBD status since 2018.   Legality: Some manufacturers ship CBD products nationally, which is illegal. Often such products are sold online and are therefore available throughout the country. CBD is openly sold in head shops and health food stores in some states where such sales have not been explicitly legalized. Selling unapproved products with unsubstantiated therapeutic claims is not only a violation of the law, but also can put patients at risk, as these products have not been proven to be  safe or effective. Federal illegality makes it difficult to conduct research on CBD.  Reference: "FDA Regulation of Cannabis and Cannabis-Derived Products, Including Cannabidiol (CBD)" - [employer]  Warning: CBD is not FDA approved and has not undergo the same manufacturing controls as prescription drugs.  This means that the purity and safety of available CBD may be questionable. Most of the time, despite manufacturer's claims, it is contaminated with THC (delta-9-tetrahydrocannabinol - the chemical in marijuana responsible for the "HIGH").  When this is the case, the [facility] contaminant will trigger a positive urine drug screen (UDS) test for Marijuana (carboxy-THC). Because a positive UDS for any illicit substance is a violation of our medication agreement, your opioid analgesics (pain medicine) may be permanently discontinued. The FDA recently put out a warning about 5 things that everyone should be aware of regarding Delta-8 THC: Delta-8 THC products have not been evaluated or approved by the FDA for safe use and may be marketed in ways that put the public health at risk. The FDA has received adverse event reports involving delta-8 THC-containing products. Delta-8 THC has psychoactive and intoxicating effects. Delta-8 THC manufacturing often involve use of potentially harmful chemicals to create the concentrations of delta-8 THC claimed in the marketplace. The final delta-8 THC product may have potentially harmful by-products (contaminants) due to the chemicals used in the process. Manufacturing of delta-8 THC products may occur in uncontrolled or unsanitary settings, which may lead to the presence of unsafe contaminants or other potentially harmful substances. Delta-8 THC products should be kept out of the reach of children and pets.  MORE ABOUT CBD  General Information: CBD was  discovered in 21 and it is a derivative of the cannabis sativa genus plants (Marijuana and Hemp). It is one of the 113 identified substances found in Marijuana. It accounts for up to 40% of the plant's extract. As of 2018, preliminary clinical studies on CBD included research for the treatment of anxiety, movement disorders, and pain. CBD is available and consumed in multiple forms, including inhalation of smoke or vapor, as an aerosol spray, and by mouth. It may be supplied as an oil containing CBD, capsules, dried cannabis, or as a liquid solution. CBD is thought not to be as psychoactive as THC (delta-9-tetrahydrocannabinol - the chemical in marijuana responsible for the "HIGH"). Studies suggest that CBD may interact with different biological target receptors in the body, including cannabinoid and other neurotransmitter receptors. As of 2018 the mechanism of action for its biological effects has not been determined.  Side-effects   Adverse reactions: Dry mouth, diarrhea, decreased appetite, fatigue, drowsiness, malaise, weakness, sleep disturbances, and others.  Drug interactions: CBC may interact with other medications such as blood-thinners. (Last update: 05/17/2021) ____________________________________________________________________________________________  ____________________________________________________________________________________________  Drug Holidays (Slow)  What is a "Drug Holiday"? Drug Holiday: is the name given to the period of time during which a patient stops taking a medication(s) for the purpose of eliminating tolerance to the drug.  Benefits Improved effectiveness of opioids. Decreased opioid dose needed to achieve benefits. Improved pain with lesser dose.  What is tolerance? Tolerance: is the progressive decreased in effectiveness of a drug due to its repetitive use. With repetitive use, the body gets use to the medication and as a consequence, it loses its  effectiveness. This is a common problem seen with opioid pain medications. As a result, a larger dose of the drug is needed to achieve the same effect that used to be obtained with a smaller dose.  How long should a "Drug Holiday" last? You should stay off of the pain medicine for at least 14 consecutive days. (2 weeks)  Should I stop the medicine "cold [place]"? No. You should always coordinate with your Pain Specialist so that he/she can provide you with the  correct medication dose to make the transition as smoothly as possible.  How do I stop the medicine? Slowly. You will be instructed to decrease the daily amount of pills that you take by one (1) pill every seven (7) days. This is called a "slow downward taper" of your dose. For example: if you normally take four (4) pills per day, you will be asked to drop this dose to three (3) pills per day for seven (7) days, then to two (2) pills per day for seven (7) days, then to one (1) per day for seven (7) days, and at the end of those last seven (7) days, this is when the "Drug Holiday" would start.   Will I have withdrawals? By doing a "slow downward taper" like this one, it is unlikely that you will experience any significant withdrawal symptoms. Typically, what triggers withdrawals is the sudden stop of a high dose opioid therapy. Withdrawals can usually be avoided by slowly decreasing the dose over a prolonged period of time. If you do not follow these instructions and decide to stop your medication abruptly, withdrawals may be possible.  What are withdrawals? Withdrawals: refers to the wide range of symptoms that occur after stopping or dramatically reducing opiate drugs after heavy and prolonged use. Withdrawal symptoms do not occur to patients that use low dose opioids, or those who take the medication sporadically. Contrary to benzodiazepine (example: Valium, Xanax, etc.) or alcohol withdrawals (Delirium Tremens), opioid withdrawals are not  lethal. Withdrawals are the physical manifestation of the body getting rid of the excess receptors.  Expected Symptoms Early symptoms of withdrawal may include: Agitation Anxiety Muscle aches Increased tearing Insomnia Runny nose Sweating Yawning  Late symptoms of withdrawal may include: Abdominal cramping Diarrhea Dilated pupils Goose bumps Nausea Vomiting  Will I experience withdrawals? Due to the slow nature of the taper, it is very unlikely that you will experience any.  What is a slow taper? Taper: refers to the gradual decrease in dose.  (Last update: 03/07/2020) ____________________________________________________________________________________________

## 2021-10-07 NOTE — Progress Notes (Signed)
PROVIDER NOTE: Interpretation of information contained herein should be left to medically-trained personnel. Specific patient instructions are provided elsewhere under "Patient Instructions" section of medical record. This document was created in part using STT-dictation technology, any transcriptional errors that may result from this process are unintentional.  Patient: Regina Campos Type: Established DOB: [birth year] MRN: [id number] PCP: Lenard Simmer, MD  Service: Procedure DOS: 10/08/2021 Setting: Ambulatory Location: Ambulatory outpatient facility Delivery: Face-to-face Provider: Gaspar Cola, MD Specialty: Interventional Pain Management Specialty designation: 09 Location: Outpatient facility Ref. Prov.: Morayati, Lourdes Sledge, MD    Primary Reason for Visit: Interventional Pain Management Treatment. CC: Back Pain (lower)   Procedure:           Type: Lumbar epidural steroid injection (LESI) (interlaminar) #4 (#4 since 2016.  Last done 04/02/2021) Laterality: Midline   Level:  L2-3 Level.  Imaging: Fluoroscopic guidance Anesthesia: Local anesthesia (1-2% Lidocaine) Anxiolysis: None                 Sedation: None. DOS: 10/08/2021  Performed by: Gaspar Cola, MD  Purpose: Diagnostic/Therapeutic Indications: Lumbar radicular pain of intraspinal etiology of more than 4 weeks that has failed to respond to conservative therapy and is severe enough to impact quality of life or function. 1. Chronic low back pain (Bilateral) w/o sciatica   2. DDD (degenerative disc disease), lumbar   3. 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 Lumbar radicular pain   5. Lumbar spondylosis with radicular symptoms    NAS-11 Pain score:   Pre-procedure: 8 /10   Post-procedure: 1 /10     Position / Prep / Materials:  Position: Prone w/ head of the table raised (slight reverse trendelenburg) to facilitate breathing.  Prep solution: DuraPrep (Iodine  Povacrylex [0.7% available iodine] and Isopropyl Alcohol, 74% w/w) Prep Area: Entire Posterior Lumbar Region from lower scapular tip down to mid buttocks area and from flank to flank. Materials:  Tray: Epidural tray Needle(s):  Type: Epidural needle          Gauge (G):  17 Length: Regular (3.5-in) Qty: 1  Pre-op H&P Assessment:  Regina Campos is a 71 y.o. (year old), female patient, seen today for interventional treatment. She  has a past surgical history that includes Abdominal hysterectomy; Total shoulder replacement; Replacement total knee; Appendectomy; Lumbar fusion; Carpal tunnel release; Back surgery (07/25/2016); Cataract extraction w/PHACO (Right, 10/13/2019); and Cataract extraction w/PHACO (Left, 11/24/2019). Regina Campos has a current medication list which includes the following prescription(s): albuterol, budesonide-formoterol, vitamin d3, fenofibrate, furosemide, gabapentin, ipratropium-albuterol, levothyroxine, naloxone, nitroglycerin, NON FORMULARY, oxygen-helium, potassium chloride, tramadol, ubrogepant, and meloxicam, and the following Facility-Administered Medications: pentafluoroprop-tetrafluoroeth. Her primarily concern today is the Back Pain (lower)  Initial Vital Signs:  Pulse/HCG Rate: 78ECG Heart Rate: 72 Temp: (!) 97.1 F (36.2 C) Resp: 16 BP: 137/81 SpO2: 98 %  BMI: Estimated body mass index is 42.05 kg/m as calculated from the following:   Height as of this encounter: 5\' 4"  (1.626 m).   Weight as of this encounter: 245 lb (111.1 kg).  Risk Assessment: Allergies: Reviewed. She has No Known Allergies.  Allergy Precautions: None required Coagulopathies: Reviewed. None identified.  Blood-thinner therapy: None at this time Active Infection(s): Reviewed. None identified. Regina Campos is afebrile  Site Confirmation: Regina Campos was asked to confirm the procedure and laterality before marking the site Procedure checklist: Completed Consent: Before the procedure and  under the influence of no sedative(s), amnesic(s), or anxiolytics, the patient was  informed of the treatment options, risks and possible complications. To fulfill our ethical and legal obligations, as recommended by the American Medical Association's Code of Ethics, I have informed the patient of my clinical impression; the nature and purpose of the treatment or procedure; the risks, benefits, and possible complications of the intervention; the alternatives, including doing nothing; the risk(s) and benefit(s) of the alternative treatment(s) or procedure(s); and the risk(s) and benefit(s) of doing nothing. The patient was provided information about the general risks and possible complications associated with the procedure. These may include, but are not limited to: failure to achieve desired goals, infection, bleeding, organ or nerve damage, allergic reactions, paralysis, and death. In addition, the patient was informed of those risks and complications associated to Spine-related procedures, such as failure to decrease pain; infection (i.e.: Meningitis, epidural or intraspinal abscess); bleeding (i.e.: epidural hematoma, subarachnoid hemorrhage, or any other type of intraspinal or peri-dural bleeding); organ or nerve damage (i.e.: Any type of peripheral nerve, nerve root, or spinal cord injury) with subsequent damage to sensory, motor, and/or autonomic systems, resulting in permanent pain, numbness, and/or weakness of one or several areas of the body; allergic reactions; (i.e.: anaphylactic reaction); and/or death. Furthermore, the patient was informed of those risks and complications associated with the medications. These include, but are not limited to: allergic reactions (i.e.: anaphylactic or anaphylactoid reaction(s)); adrenal axis suppression; blood sugar elevation that in diabetics may result in ketoacidosis or comma; water retention that in patients with history of congestive heart failure may result in  shortness of breath, pulmonary edema, and decompensation with resultant heart failure; weight gain; swelling or edema; medication-induced neural toxicity; particulate matter embolism and blood vessel occlusion with resultant organ, and/or nervous system infarction; and/or aseptic necrosis of one or more joints. Finally, the patient was informed that Medicine is not an exact science; therefore, there is also the possibility of unforeseen or unpredictable risks and/or possible complications that may result in a catastrophic outcome. The patient indicated having understood very clearly. We have given the patient no guarantees and we have made no promises. Enough time was given to the patient to ask questions, all of which were answered to the patient's satisfaction. Ms. Deakins has indicated that she wanted to continue with the procedure. Attestation: I, the ordering provider, attest that I have discussed with the patient the benefits, risks, side-effects, alternatives, likelihood of achieving goals, and potential problems during recovery for the procedure that I have provided informed consent. Date   Time: 10/08/2021  9:30 AM  Pre-Procedure Preparation:  Monitoring: As per clinic protocol. Respiration, ETCO2, SpO2, BP, heart rate and rhythm monitor placed and checked for adequate function Safety Precautions: Patient was assessed for positional comfort and pressure points before starting the procedure. Time-out: I initiated and conducted the "Time-out" before starting the procedure, as per protocol. The patient was asked to participate by confirming the accuracy of the "Time Out" information. Verification of the correct person, site, and procedure were performed and confirmed by me, the nursing staff, and the patient. "Time-out" conducted as per Joint Commission's Universal Protocol (UP.01.01.01). Time: 1026  Description/Narrative of Procedure:          Target: Epidural space via interlaminar opening,  initially targeting the lower laminar border of the superior vertebral body. Region: Lumbar Approach: Percutaneous paravertebral  Rationale (medical necessity): procedure needed and proper for the diagnosis and/or treatment of the patient's medical symptoms and needs. Procedural Technique Safety Precautions: Aspiration looking for blood return was conducted prior  to all injections. At no point did we inject any substances, as a needle was being advanced. No attempts were made at seeking any paresthesias. Safe injection practices and needle disposal techniques used. Medications properly checked for expiration dates. SDV (single dose vial) medications used. Description of the Procedure: Protocol guidelines were followed. The procedure needle was introduced through the skin, ipsilateral to the reported pain, and advanced to the target area. Bone was contacted and the needle walked caudad, until the lamina was cleared. The epidural space was identified using loss-of-resistance technique with 2-3 ml of PF-NaCl (0.9% NSS), in a 5cc LOR glass syringe.  Vitals:   10/08/21 1024 10/08/21 1026 10/08/21 1031 10/08/21 1040  BP: 136/81 130/82 131/86 139/69  Pulse:      Resp: 13 13 14 16   Temp:      SpO2: 93% 93% 96% 96%  Weight:      Height:        Start Time: 1026 hrs. End Time: 1031 hrs.  Imaging Guidance (Spinal):          Type of Imaging Technique: Fluoroscopy Guidance (Spinal) Indication(s): Assistance in needle guidance and placement for procedures requiring needle placement in or near specific anatomical locations not easily accessible without such assistance. Exposure Time: Please see nurses notes. Contrast: Before injecting any contrast, we confirmed that the patient did not have an allergy to iodine, shellfish, or radiological contrast. Once satisfactory needle placement was completed at the desired level, radiological contrast was injected. Contrast injected under live fluoroscopy. No  contrast complications. See chart for type and volume of contrast used. Fluoroscopic Guidance: I was personally present during the use of fluoroscopy. "Tunnel Vision Technique" used to obtain the best possible view of the target area. Parallax error corrected before commencing the procedure. "Direction-depth-direction" technique used to introduce the needle under continuous pulsed fluoroscopy. Once target was reached, antero-posterior, oblique, and lateral fluoroscopic projection used confirm needle placement in all planes. Images permanently stored in EMR. Interpretation: I personally interpreted the imaging intraoperatively. Adequate needle placement confirmed in multiple planes. Appropriate spread of contrast into desired area was observed. No evidence of afferent or efferent intravascular uptake. No intrathecal or subarachnoid spread observed. Permanent images saved into the patient's record.  Antibiotic Prophylaxis:   Anti-infectives (From admission, onward)    None      Indication(s): None identified  Post-operative Assessment:  Post-procedure Vital Signs:  Pulse/HCG Rate: 7867 Temp: (!) 97.1 F (36.2 C) Resp: 16 BP: 139/69 SpO2: 96 %  EBL: None  Complications: No immediate post-treatment complications observed by team, or reported by patient.  Note: The patient tolerated the entire procedure well. A repeat set of vitals were taken after the procedure and the patient was kept under observation following institutional policy, for this type of procedure. Post-procedural neurological assessment was performed, showing return to baseline, prior to discharge. The patient was provided with post-procedure discharge instructions, including a section on how to identify potential problems. Should any problems arise concerning this procedure, the patient was given instructions to immediately contact us, at any time, without hesitation. In any case, we plan to contact the patient by telephone for  a follow-up status report regarding this interventional procedure.  Comments:  No additional relevant information.  Plan of Care  Orders:  Orders Placed This Encounter  Procedures   Lumbar Epidural Injection    Scheduling Instructions:     Procedure: Interlaminar LESI L2-3     Laterality: Midline     Sedation: Patient's choice  Timeframe: Today    Order Specific Question:   Where will this procedure be performed?    Answer:   [facility]   DG PAIN [facility] C-ARM 1-60 MIN NO REPORT    Intraoperative interpretation by procedural physician at [place].    Standing Status:   Standing    Number of Occurrences:   1    Order Specific Question:   Reason for exam:    Answer:   Assistance in needle guidance and placement for procedures requiring needle placement in or near specific anatomical locations not easily accessible without such assistance.   Informed Consent Details: Physician/Practitioner Attestation; Transcribe to consent form and obtain patient signature    Note: Always confirm laterality of pain with Ms. Minniear, before procedure. Transcribe to consent form and obtain patient signature.    Order Specific Question:   Physician/Practitioner attestation of informed consent for procedure/surgical case    Answer:   I, the physician/practitioner, attest that I have discussed with the patient the benefits, risks, side effects, alternatives, likelihood of achieving goals and potential problems during recovery for the procedure that I have provided informed consent.    Order Specific Question:   Procedure    Answer:   Lumbar epidural steroid injection under fluoroscopic guidance    Order Specific Question:   Physician/Practitioner performing the procedure    Answer:   Verlee Pope A. Dossie Arbour, MD    Order Specific Question:   Indication/Reason    Answer:   Low back and/or lower extremity pain secondary to lumbar radiculitis   Provide equipment / supplies at bedside     "Epidural Tray" (Disposable   single use) Catheter: NOT required    Standing Status:   Standing    Number of Occurrences:   1    Order Specific Question:   Specify    Answer:   Epidural Tray   Chronic Opioid Analgesic:  Tramadol 50 mg, 2 tabs PO q 6 hrs (400 mg/day of tramadol) MME/day: 40 mg/day.   Medications ordered for procedure: Meds ordered this encounter  Medications   iohexol (OMNIPAQUE) 180 MG/ML injection 10 mL    Must be Myelogram-compatible. If not available, you may substitute with a water-soluble, non-ionic, hypoallergenic, myelogram-compatible radiological contrast medium.   lidocaine (XYLOCAINE) 2 % (with pres) injection 400 mg   pentafluoroprop-tetrafluoroeth (GEBAUERS) aerosol   lactated ringers infusion 1,000 mL   midazolam (VERSED) 5 MG/5ML injection 0.5-2 mg    Make sure Flumazenil is available in the pyxis when using this medication. If oversedation occurs, administer 0.2 mg IV over 15 sec. If after 45 sec no response, administer 0.2 mg again over 1 min; may repeat at 1 min intervals; not to exceed 4 doses (1 mg)   sodium chloride flush (NS) 0.9 % injection 2 mL   ropivacaine (PF) 2 mg/mL (0.2%) (NAROPIN) injection 2 mL   triamcinolone acetonide (KENALOG-40) injection 40 mg   Medications administered: We administered iohexol, lidocaine, lactated ringers, midazolam, sodium chloride flush, ropivacaine (PF) 2 mg/mL (0.2%), and triamcinolone acetonide.  See the medical record for exact dosing, route, and time of administration.  Follow-up plan:   Return in about 2 weeks (around 10/22/2021) for Proc-day (T,Th), (VV), (PPE).       Interventional Therapies  Risk   Complexity Considerations:   Estimated body mass index is 39.94 kg/m as calculated from the following:   Height as of 04/02/21: 5\' 5"  (1.651 m).   Weight as of 04/02/21: 240 lb (108.9 kg).  WNL   Planned   Pending:   Pending further evaluation   Under consideration:   Possible bilateral suprascapular RFA   Bilateral intra-articular Hip injection  Diagnostic bilateral cervical facet block  Possible bilateral cervical facet RFA  Diagnostic bilateral lumbar facet block  Possible bilateral lumbar facet RFA  Diagnostic right dorsolateral Tarsometatarsal joint 4, foot steroid injection #2    Completed:   Therapeutic midline/right L2-3 LESI x4 (10/08/2021) (100/100/90/100)  Diagnostic/therapeutic bilateral foot 4th TMT steroid injection x2 (03/01/2020)  Diagnostic/therapeutic left intercostal NB of ribs 5-8 x1 (02/07/2020)  Palliative right L3-4 LESI x2  Palliative right L4 TFESI x2  Palliative right C7-T1 CESI x1  Palliative left IA shoulder joint injection x1  Palliative right suprascapular NB x1  Palliative left suprascapular NB x2  Palliative bilateral IA Hip injection x1  Therapeutic right finger #4 (ring) A-1 pulley area, trigger finger tendon sheath injection x1  Diagnostic bilateral dorsolateral junction of Cuboid and 5th PMC, foot steroid injection x1    Therapeutic   Palliative (PRN) options:   Palliative right L2-3 LESI #5  Palliative right C7-T1 CESI #2  Therapeutic right finger #4 (ring) A-1 pulley area, trigger finger tendon sheath injection #2  Diagnostic bilateral dorsolateral junction of Cuboid and 5th PMC, foot steroid injection #2      Recent Visits Date Type Provider Dept  10/02/21 Office Visit Milinda Pointer, MD [facility]  Showing recent visits within past 90 days and meeting all other requirements Today's Visits Date Type Provider Dept  10/08/21 Procedure visit Milinda Pointer, MD [facility]  Showing today's visits and meeting all other requirements Future Appointments Date Type Provider Dept  10/22/21 Appointment Milinda Pointer, MD [facility]  12/25/21 Appointment Milinda Pointer, MD [facility]  Showing future appointments within next 90 days and meeting all other requirements  Disposition: Discharge  home  Discharge (Date   Time): 10/08/2021; 1045 hrs.   Primary Care Physician: Lenard Simmer, MD Location: [facility] Note by: Gaspar Cola, MD Date: 10/08/2021; Time: 10:52 AM  Disclaimer:  Medicine is not an Chief Strategy Officer. The only guarantee in medicine is that nothing is guaranteed. It is important to note that the decision to proceed with this intervention was based on the information collected from the patient. The Data and conclusions were drawn from the patient's questionnaire, the interview, and the physical examination. Because the information was provided in large part by the patient, it cannot be guaranteed that it has not been purposely or unconsciously manipulated. Every effort has been made to obtain as much relevant data as possible for this evaluation. It is important to note that the conclusions that lead to this procedure are derived in large part from the available data. Always take into account that the treatment will also be dependent on availability of resources and existing treatment guidelines, considered by other Pain Management Practitioners as being common knowledge and practice, at the time of the intervention. For Medico-Legal purposes, it is also important to point out that variation in procedural techniques and pharmacological choices are the acceptable norm. The indications, contraindications, technique, and results of the above procedure should only be interpreted and judged by a Board-Certified Interventional Pain Specialist with extensive familiarity and expertise in the same exact procedure and technique.

## 2021-10-08 ENCOUNTER — Other Ambulatory Visit: Payer: Self-pay

## 2021-10-08 ENCOUNTER — Ambulatory Visit (HOSPITAL_BASED_OUTPATIENT_CLINIC_OR_DEPARTMENT_OTHER): Payer: Medicare Other | Admitting: Pain Medicine

## 2021-10-08 ENCOUNTER — Ambulatory Visit
Admission: RE | Admit: 2021-10-08 | Discharge: 2021-10-08 | Disposition: A | Discharge status: Home / Self Care | Payer: Medicare Other | Source: Ambulatory Visit | Attending: Pain Medicine | Admitting: Pain Medicine

## 2021-10-08 ENCOUNTER — Encounter: Payer: Self-pay | Admitting: Pain Medicine

## 2021-10-08 VITALS — BP 139/69 | HR 78 | Temp 97.1°F | Resp 16 | Ht 64.0 in | Wt 245.0 lb

## 2021-10-08 DIAGNOSIS — M5136 Other intervertebral disc degeneration, lumbar region: Secondary | ICD-10-CM | POA: Diagnosis present

## 2021-10-08 DIAGNOSIS — M5416 Radiculopathy, lumbar region: Secondary | ICD-10-CM | POA: Insufficient documentation

## 2021-10-08 DIAGNOSIS — G8929 Other chronic pain: Secondary | ICD-10-CM | POA: Diagnosis present

## 2021-10-08 DIAGNOSIS — M4726 Other spondylosis with radiculopathy, lumbar region: Secondary | ICD-10-CM | POA: Insufficient documentation

## 2021-10-08 DIAGNOSIS — M545 Low back pain, unspecified: Secondary | ICD-10-CM | POA: Diagnosis present

## 2021-10-08 DIAGNOSIS — M961 Postlaminectomy syndrome, not elsewhere classified: Secondary | ICD-10-CM

## 2021-10-08 MED ORDER — ROPIVACAINE HCL 2 MG/ML IJ SOLN
2.0000 mL | Freq: Once | INTRAMUSCULAR | Status: AC
  Administered 2021-10-08: 2 mL via EPIDURAL

## 2021-10-08 MED ORDER — SODIUM CHLORIDE 0.9% FLUSH
2.0000 mL | Freq: Once | INTRAVENOUS | Status: AC
  Administered 2021-10-08: 2 mL

## 2021-10-08 MED ORDER — PENTAFLUOROPROP-TETRAFLUOROETH EX AERO
INHALATION_SPRAY | Freq: Once | CUTANEOUS | Status: DC
  Filled 2021-10-08: qty 116

## 2021-10-08 MED ORDER — MIDAZOLAM HCL 5 MG/5ML IJ SOLN
0.5000 mg | Freq: Once | INTRAMUSCULAR | Status: AC
  Administered 2021-10-08: 1.5 mg via INTRAVENOUS
  Filled 2021-10-08: qty 5

## 2021-10-08 MED ORDER — LIDOCAINE HCL 2 % IJ SOLN
20.0000 mL | Freq: Once | INTRAMUSCULAR | Status: AC
  Administered 2021-10-08: 400 mg
  Filled 2021-10-08: qty 20

## 2021-10-08 MED ORDER — ROPIVACAINE HCL 2 MG/ML IJ SOLN
INTRAMUSCULAR | Status: AC
  Filled 2021-10-08: qty 20

## 2021-10-08 MED ORDER — LACTATED RINGERS IV SOLN
1000.0000 mL | Freq: Once | INTRAVENOUS | Status: AC
  Administered 2021-10-08: 1000 mL via INTRAVENOUS

## 2021-10-08 MED ORDER — TRIAMCINOLONE ACETONIDE 40 MG/ML IJ SUSP
40.0000 mg | Freq: Once | INTRAMUSCULAR | Status: AC
  Administered 2021-10-08: 40 mg
  Filled 2021-10-08: qty 1

## 2021-10-08 MED ORDER — SODIUM CHLORIDE (PF) 0.9 % IJ SOLN
INTRAMUSCULAR | Status: AC
  Filled 2021-10-08: qty 10

## 2021-10-08 MED ORDER — IOHEXOL 180 MG/ML  SOLN
10.0000 mL | Freq: Once | INTRAMUSCULAR | Status: AC
  Administered 2021-10-08: 5 mL via EPIDURAL

## 2021-10-08 NOTE — Progress Notes (Signed)
Safety precautions to be maintained throughout the outpatient stay will include: orient to surroundings, keep bed in low position, maintain call bell within reach at all times, provide assistance with transfer out of bed and ambulation.  

## 2021-10-09 ENCOUNTER — Telehealth: Payer: Self-pay

## 2021-10-09 NOTE — Telephone Encounter (Signed)
Patient called back and states that she is doing OK today.Instructed to call if needed.

## 2021-10-09 NOTE — Telephone Encounter (Signed)
Post procedure phone call.  LM 

## 2021-10-18 ENCOUNTER — Encounter: Payer: Self-pay | Admitting: *Deleted

## 2021-10-21 ENCOUNTER — Ambulatory Visit
Admission: RE | Admit: 2021-10-21 | Discharge: 2021-10-21 | Disposition: A | Discharge status: Home / Self Care | Payer: Medicare Other | Attending: Gastroenterology | Admitting: Gastroenterology

## 2021-10-21 ENCOUNTER — Encounter
Admission: RE | Disposition: A | Discharge status: Home / Self Care | Payer: Self-pay | Source: Home / Self Care | Attending: Gastroenterology

## 2021-10-21 ENCOUNTER — Ambulatory Visit: Payer: Medicare Other | Admitting: Anesthesiology

## 2021-10-21 ENCOUNTER — Telehealth: Payer: Self-pay

## 2021-10-21 DIAGNOSIS — R1013 Epigastric pain: Secondary | ICD-10-CM | POA: Insufficient documentation

## 2021-10-21 DIAGNOSIS — E039 Hypothyroidism, unspecified: Secondary | ICD-10-CM | POA: Diagnosis not present

## 2021-10-21 DIAGNOSIS — Z96619 Presence of unspecified artificial shoulder joint: Secondary | ICD-10-CM | POA: Diagnosis not present

## 2021-10-21 DIAGNOSIS — K219 Gastro-esophageal reflux disease without esophagitis: Secondary | ICD-10-CM | POA: Diagnosis not present

## 2021-10-21 DIAGNOSIS — K31A19 Gastric intestinal metaplasia without dysplasia, unspecified site: Secondary | ICD-10-CM | POA: Insufficient documentation

## 2021-10-21 DIAGNOSIS — G4733 Obstructive sleep apnea (adult) (pediatric): Secondary | ICD-10-CM | POA: Diagnosis not present

## 2021-10-21 DIAGNOSIS — Z90710 Acquired absence of both cervix and uterus: Secondary | ICD-10-CM | POA: Diagnosis not present

## 2021-10-21 DIAGNOSIS — G8929 Other chronic pain: Secondary | ICD-10-CM | POA: Diagnosis not present

## 2021-10-21 DIAGNOSIS — Z9981 Dependence on supplemental oxygen: Secondary | ICD-10-CM | POA: Insufficient documentation

## 2021-10-21 DIAGNOSIS — E785 Hyperlipidemia, unspecified: Secondary | ICD-10-CM | POA: Diagnosis not present

## 2021-10-21 DIAGNOSIS — Z9049 Acquired absence of other specified parts of digestive tract: Secondary | ICD-10-CM | POA: Diagnosis not present

## 2021-10-21 DIAGNOSIS — M1990 Unspecified osteoarthritis, unspecified site: Secondary | ICD-10-CM | POA: Diagnosis not present

## 2021-10-21 DIAGNOSIS — Z1211 Encounter for screening for malignant neoplasm of colon: Secondary | ICD-10-CM | POA: Insufficient documentation

## 2021-10-21 DIAGNOSIS — Z8673 Personal history of transient ischemic attack (TIA), and cerebral infarction without residual deficits: Secondary | ICD-10-CM | POA: Insufficient documentation

## 2021-10-21 DIAGNOSIS — J449 Chronic obstructive pulmonary disease, unspecified: Secondary | ICD-10-CM | POA: Diagnosis not present

## 2021-10-21 DIAGNOSIS — K319 Disease of stomach and duodenum, unspecified: Secondary | ICD-10-CM | POA: Diagnosis not present

## 2021-10-21 DIAGNOSIS — Z538 Procedure and treatment not carried out for other reasons: Secondary | ICD-10-CM | POA: Insufficient documentation

## 2021-10-21 DIAGNOSIS — Z6841 Body Mass Index (BMI) 40.0 and over, adult: Secondary | ICD-10-CM | POA: Diagnosis not present

## 2021-10-21 DIAGNOSIS — Z96653 Presence of artificial knee joint, bilateral: Secondary | ICD-10-CM | POA: Insufficient documentation

## 2021-10-21 HISTORY — PX: COLONOSCOPY: SHX5424

## 2021-10-21 HISTORY — DX: Spinal stenosis, site unspecified: M48.00

## 2021-10-21 HISTORY — PX: ESOPHAGOGASTRODUODENOSCOPY (EGD) WITH PROPOFOL: SHX5813

## 2021-10-21 SURGERY — COLONOSCOPY
Anesthesia: General

## 2021-10-21 MED ORDER — PROPOFOL 500 MG/50ML IV EMUL
INTRAVENOUS | Status: DC | PRN
Start: 2021-10-21 — End: 2021-10-21
  Administered 2021-10-21: 175 ug/kg/min via INTRAVENOUS

## 2021-10-21 MED ORDER — SODIUM CHLORIDE 0.9 % IV SOLN
INTRAVENOUS | Status: DC
  Administered 2021-10-21: 20 mL/h via INTRAVENOUS

## 2021-10-21 MED ORDER — LIDOCAINE HCL (CARDIAC) PF 100 MG/5ML IV SOSY
PREFILLED_SYRINGE | INTRAVENOUS | Status: DC | PRN
  Administered 2021-10-21: 100 mg via INTRAVENOUS

## 2021-10-21 MED ORDER — PROPOFOL 10 MG/ML IV BOLUS
INTRAVENOUS | Status: DC | PRN
  Administered 2021-10-21: 100 mg via INTRAVENOUS

## 2021-10-21 MED ORDER — PROPOFOL 500 MG/50ML IV EMUL
INTRAVENOUS | Status: AC
  Filled 2021-10-21: qty 50
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
## 2021-10-21 NOTE — Interval H&P Note (Signed)
History and Physical Interval Note: ? ?10/21/2021 ?8:14 AM ? ?Regina Campos  has presented today for surgery, with the diagnosis of EPIGASTRIC PAIN(R10.13,DYSPEPSIA(R10.13),CCA SCREEN (Z12.11.  The various methods of treatment have been discussed with the patient and family. After consideration of risks, benefits and other options for treatment, the patient has consented to  Procedure(s): ?COLONOSCOPY (N/A) ?ESOPHAGOGASTRODUODENOSCOPY (EGD) WITH PROPOFOL (N/A) as a surgical intervention.  The patient's history has been reviewed, patient examined, no change in status, stable for surgery.  I have reviewed the patient's chart and labs.  Questions were answered to the patient's satisfaction.   ? ? ?[name] ? ?Ok to proceed with EGD/Colonoscopy ?

## 2021-10-21 NOTE — Telephone Encounter (Signed)
LM for patient to call office to go over virtual appointment questions.  ?

## 2021-10-21 NOTE — Op Note (Signed)
[facility] ?Gastroenterology ?Patient Name: Regina Campos ?Procedure Date: 10/21/2021 8:20 AM ?MRN: [id number] ?Account #: [id number] ?Date of Birth: 1951-02-18 ?Admit Type: Outpatient ?Age: 71 ?Room: [facility] ENDO ROOM 3 ?Gender: Female ?Note Status: Finalized ?Instrument Name: Upper Endoscope [id number] ?Procedure:             Upper GI endoscopy ?Indications:           Dyspepsia, Gastro-esophageal reflux disease ?Providers:             Andrey Farmer MD, MD ?Referring MD:          Lenard Simmer, MD (Referring MD) ?Medicines:             Monitored Anesthesia Care ?Complications:         No immediate complications. Estimated blood loss:  ?                       Minimal. ?Procedure:             Pre-Anesthesia Assessment: ?                       - Prior to the procedure, a History and Physical was  ?                       performed, and patient medications and allergies were  ?                       reviewed. The patient is competent. The risks and  ?                       benefits of the procedure and the sedation options and  ?                       risks were discussed with the patient. All questions  ?                       were answered and informed consent was obtained.  ?                       Patient identification and proposed procedure were  ?                       verified by the physician, the nurse, the  ?                       anesthesiologist, the anesthetist and the technician  ?                       in the endoscopy suite. Mental Status Examination:  ?                       alert and oriented. Airway Examination: normal  ?                       oropharyngeal airway and neck mobility. Respiratory  ?                       Examination: clear to auscultation. CV Examination:  ?  normal. Prophylactic Antibiotics: The patient does not  ?                       require prophylactic antibiotics. Prior  ?                       Anticoagulants: The patient has taken no  previous  ?                       anticoagulant or antiplatelet agents. ASA Grade  ?                       Assessment: III - A patient with severe systemic  ?                       disease. After reviewing the risks and benefits, the  ?                       patient was deemed in satisfactory condition to  ?                       undergo the procedure. The anesthesia plan was to use  ?                       monitored anesthesia care (MAC). Immediately prior to  ?                       administration of medications, the patient was  ?                       re-assessed for adequacy to receive sedatives. The  ?                       heart rate, respiratory rate, oxygen saturations,  ?                       blood pressure, adequacy of pulmonary ventilation, and  ?                       response to care were monitored throughout the  ?                       procedure. The physical status of the patient was  ?                       re-assessed after the procedure. ?                       After obtaining informed consent, the endoscope was  ?                       passed under direct vision. Throughout the procedure,  ?                       the patient's blood pressure, pulse, and oxygen  ?                       saturations were monitored continuously. The Endoscope  ?  was introduced through the mouth, and advanced to the  ?                       second part of duodenum. The upper GI endoscopy was  ?                       accomplished without difficulty. The patient tolerated  ?                       the procedure well. ?Findings: ?     The examined esophagus was normal. ?     The entire examined stomach was normal. Biopsies were taken with a cold  ?     forceps for histology. Estimated blood loss was minimal. ?     The examined duodenum was normal. ?Impression:            - Normal esophagus. ?                       - Normal stomach. Biopsied. ?                       - Normal examined  duodenum. ?Recommendation:        - Await pathology results. ?                       - Perform a colonoscopy today. ?Procedure Code(s):     --- Professional --- ?                       [phone], Esophagogastroduodenoscopy, flexible,  ?                       transoral; with biopsy, single or multiple ?Diagnosis Code(s):     --- Professional --- ?                       R10.13, Epigastric pain ?                       K21.9, Gastro-esophageal reflux disease without  ?                       esophagitis ?CPT copyright 2019 American Medical Association. All rights reserved. ?The codes documented in this report are preliminary and upon coder review may  ?be revised to meet current compliance requirements. ?Andrey Farmer MD, MD ?10/21/2021 8:37:15 AM ?Number of Addenda: 0 ?Note Initiated On: 10/21/2021 8:20 AM ?Estimated Blood Loss:  Estimated blood loss was minimal. ?     [facility] ?

## 2021-10-21 NOTE — Anesthesia Postprocedure Evaluation (Signed)
Anesthesia Post Note ? ?Patient: Regina Campos ? ?Procedure(s) Performed: COLONOSCOPY ?ESOPHAGOGASTRODUODENOSCOPY (EGD) WITH PROPOFOL ? ?Patient location during evaluation: PACU ?Anesthesia Type: General ?Level of consciousness: awake and alert ?Pain management: pain level controlled ?Vital Signs Assessment: post-procedure vital signs reviewed and stable ?Respiratory status: spontaneous breathing, nonlabored ventilation, respiratory function stable and patient connected to nasal cannula oxygen ?Cardiovascular status: blood pressure returned to baseline and stable ?Postop Assessment: no apparent nausea or vomiting ?Anesthetic complications: no ? ? ?No notable events documented. ? ? ?Last Vitals:  ?Vitals:  ? 10/21/21 0850 10/21/21 0900  ?BP: 120/74 (!) 154/84  ?Pulse: (!) 57 (!) 109  ?Resp: (!) 33 16  ?Temp:    ?SpO2: 96% 98%  ?  ?Last Pain:  ?Vitals:  ? 10/21/21 0840  ?TempSrc: Temporal  ?PainSc:   ? ? ?  ?  ?  ?  ?  ?  ? ?Molli Barrows ? ? ? ? ?
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
## 2021-10-21 NOTE — Op Note (Signed)
[facility] ?Gastroenterology ?Patient Name: Regina Campos ?Procedure Date: 10/21/2021 8:19 AM ?MRN: [id number] ?Account #: [id number] ?Date of Birth: 11/04/1950 ?Admit Type: Outpatient ?Age: 71 ?Room: [facility] ENDO ROOM 3 ?Gender: Female ?Note Status: Finalized ?Instrument Name: Colonscope [id number] ?Procedure:             Colonoscopy ?Indications:           Screening for colorectal malignant neoplasm ?Providers:             Andrey Farmer MD, MD ?Referring MD:          Lenard Simmer, MD (Referring MD) ?Medicines:             Monitored Anesthesia Care ?Complications:         No immediate complications. ?Procedure:             Pre-Anesthesia Assessment: ?                       - Prior to the procedure, a History and Physical was  ?                       performed, and patient medications and allergies were  ?                       reviewed. The patient is competent. The risks and  ?                       benefits of the procedure and the sedation options and  ?                       risks were discussed with the patient. All questions  ?                       were answered and informed consent was obtained.  ?                       Patient identification and proposed procedure were  ?                       verified by the physician, the nurse, the  ?                       anesthesiologist, the anesthetist and the technician  ?                       in the endoscopy suite. Mental Status Examination:  ?                       alert and oriented. Airway Examination: normal  ?                       oropharyngeal airway and neck mobility. Respiratory  ?                       Examination: clear to auscultation. CV Examination:  ?                       normal. Prophylactic Antibiotics: The patient does not  ?  require prophylactic antibiotics. Prior  ?                       Anticoagulants: The patient has taken no previous  ?                       anticoagulant or antiplatelet agents.  ASA Grade  ?                       Assessment: III - A patient with severe systemic  ?                       disease. After reviewing the risks and benefits, the  ?                       patient was deemed in satisfactory condition to  ?                       undergo the procedure. The anesthesia plan was to use  ?                       monitored anesthesia care (MAC). Immediately prior to  ?                       administration of medications, the patient was  ?                       re-assessed for adequacy to receive sedatives. The  ?                       heart rate, respiratory rate, oxygen saturations,  ?                       blood pressure, adequacy of pulmonary ventilation, and  ?                       response to care were monitored throughout the  ?                       procedure. The physical status of the patient was  ?                       re-assessed after the procedure. ?                       After obtaining informed consent, the colonoscope was  ?                       passed under direct vision. Throughout the procedure,  ?                       the patient's blood pressure, pulse, and oxygen  ?                       saturations were monitored continuously. The  ?                       Colonoscope was introduced through the anus with the  ?  intention of advancing to the cecum. The scope was  ?                       advanced to the sigmoid colon before the procedure was  ?                       aborted. Medications were given. The colonoscopy was  ?                       aborted due to poor bowel prep with stool present. ?Findings: ?     The perianal and digital rectal examinations were normal. ?     A large amount of stool was found in the recto-sigmoid colon, precluding  ?     visualization. ?Impression:            - The procedure was aborted due to poor bowel prep  ?                       with stool present. ?                       - Stool in the recto-sigmoid colon. ?                        - No specimens collected. ?Recommendation:        - Discharge patient to home. ?                       - Resume previous diet. ?                       - Continue present medications. ?                       - Repeat colonoscopy at the next available appointment  ?                       for screening purposes. ?                       - Return to referring physician as previously  ?                       scheduled. ?Procedure Code(s):     --- Professional --- ?                       G0121, 44, Colorectal cancer screening; colonoscopy on  ?                       individual not meeting criteria for high risk ?Diagnosis Code(s):     --- Professional --- ?                       Z12.11, Encounter for screening for malignant neoplasm  ?                       of colon ?CPT copyright 2019 American Medical Association. All rights reserved. ?The codes documented in this report are preliminary and upon coder review may  ?be revised to meet current compliance requirements. ?Andrey Farmer MD, MD ?10/21/2021  8:39:21 AM ?Number of Addenda: 0 ?Note Initiated On: 10/21/2021 8:19 AM ?Total Procedure Duration: 0 hours 1 minute 36 seconds  ?Estimated Blood Loss:  Estimated blood loss: none. ?     [facility] ?

## 2021-10-21 NOTE — Brief Op Note (Signed)
Solid stool present. Colonoscopy aborted at rectosigmoid colon. ?

## 2021-10-21 NOTE — Transfer of Care (Signed)
Immediate Anesthesia Transfer of Care Note ? ?Patient: Regina Campos ? ?Procedure(s) Performed: COLONOSCOPY ?ESOPHAGOGASTRODUODENOSCOPY (EGD) WITH PROPOFOL ? ?Patient Location: Endoscopy Unit ? ?Anesthesia Type:General ? ?Level of Consciousness: awake ? ?Airway & Oxygen Therapy: Patient Spontanous Breathing ? ?Post-op Assessment: Report given to RN and Post -op Vital signs reviewed and stable ? ?Post vital signs: Reviewed and stable ? ?Last Vitals:  ?Vitals Value Taken Time  ?BP 129/69   ?Temp    ?Pulse 58 10/21/21 0841  ?Resp 15 10/21/21 0841  ?SpO2 99 % 10/21/21 0841  ?Vitals shown include unvalidated device data. ? ?Last Pain:  ?Vitals:  ? 10/21/21 0749  ?TempSrc: Temporal  ?PainSc: 0-No pain  ?   ? ?  ? ?Complications: No notable events documented. ?

## 2021-10-22 ENCOUNTER — Ambulatory Visit: Payer: Medicare Other | Attending: Pain Medicine | Admitting: Pain Medicine

## 2021-10-22 ENCOUNTER — Other Ambulatory Visit: Payer: Self-pay

## 2021-10-22 ENCOUNTER — Ambulatory Visit: Payer: Medicare Other | Admitting: Internal Medicine

## 2021-10-22 ENCOUNTER — Encounter: Payer: Self-pay | Admitting: Gastroenterology

## 2021-10-22 DIAGNOSIS — R937 Abnormal findings on diagnostic imaging of other parts of musculoskeletal system: Secondary | ICD-10-CM | POA: Diagnosis not present

## 2021-10-22 DIAGNOSIS — G8929 Other chronic pain: Secondary | ICD-10-CM

## 2021-10-22 DIAGNOSIS — M4310 Spondylolisthesis, site unspecified: Secondary | ICD-10-CM | POA: Diagnosis not present

## 2021-10-22 DIAGNOSIS — M545 Low back pain, unspecified: Secondary | ICD-10-CM | POA: Diagnosis not present

## 2021-10-22 DIAGNOSIS — M47816 Spondylosis without myelopathy or radiculopathy, lumbar region: Secondary | ICD-10-CM | POA: Diagnosis not present

## 2021-10-22 LAB — SURGICAL PATHOLOGY
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
## 2021-10-22 NOTE — Patient Instructions (Signed)
______________________________________________________________________ ° °Preparing for Procedure with Sedation ° °NOTICE: Due to recent regulatory changes, starting on March 18, 2021, procedures requiring intravenous (IV) sedation will no longer be performed at the [facility].  These types of procedures are required to be performed at [facility] ambulatory [facility] facility.  We are very sorry for the inconvenience. ° °Procedure appointments are limited to planned procedures: °No Prescription Refills. °No disability issues will be discussed. °No medication changes will be discussed. ° °Instructions: °Oral Intake: Do not eat or drink anything for at least 8 hours prior to your procedure. (Exception: Blood Pressure Medication. See below.) °Transportation: A driver is required. You may not drive yourself after the procedure. °Blood Pressure Medicine: Do not forget to take your blood pressure medicine with a sip of water the morning of the procedure. If your Diastolic (lower reading) is above 100 mmHg, elective cases will be cancelled/rescheduled. °Blood thinners: These will need to be stopped for procedures. Notify our staff if you are taking any blood thinners. Depending on which one you take, there will be specific instructions on how and when to stop it. °Diabetics on insulin: Notify the staff so that you can be scheduled 1st case in the morning. If your diabetes requires high dose insulin, take only ½ of your normal insulin dose the morning of the procedure and notify the staff that you have done so. °Preventing infections: Shower with an antibacterial soap the morning of your procedure. °Build-up your immune system: Take 1000 mg of Vitamin C with every meal (3 times a day) the day prior to your procedure. °Antibiotics: Inform the staff if you have a condition or reason that requires you to take antibiotics before dental procedures. °Pregnancy: If you are pregnant, call and cancel the procedure. °Sickness: If  you have a cold, fever, or any active infections, call and cancel the procedure. °Arrival: You must be in the facility at least 30 minutes prior to your scheduled procedure. °Children: Do not bring children with you. °Dress appropriately: Bring dark clothing that you would not mind if they get stained. °Valuables: Do not bring any jewelry or valuables. ° °Reasons to call and reschedule or cancel your procedure: (Following these recommendations will minimize the risk of a serious complication.) °Surgeries: Avoid having procedures within 2 weeks of any surgery. (Avoid for 2 weeks before or after any surgery). °Flu Shots: Avoid having procedures within 2 weeks of a flu shots. (Avoid for 2 weeks before or after immunizations). °Barium: Avoid having a procedure within 7-10 days after having had a radiological study involving the use of radiological contrast. (Myelograms, Barium swallow or enema study). °Heart attacks: Avoid any elective procedures or surgeries for the initial 6 months after a "Myocardial Infarction" (Heart Attack). °Blood thinners: It is imperative that you stop these medications before procedures. Let us know if you if you take any blood thinner.  °Infection: Avoid procedures during or within two weeks of an infection (including chest colds or gastrointestinal problems). Symptoms associated with infections include: Localized redness, fever, chills, night sweats or profuse sweating, burning sensation when voiding, cough, congestion, stuffiness, runny nose, sore throat, diarrhea, nausea, vomiting, cold or Flu symptoms, recent or current infections. It is specially important if the infection is over the area that we intend to treat. °Heart and lung problems: Symptoms that may suggest an active cardiopulmonary problem include: cough, chest pain, breathing difficulties or shortness of breath, dizziness, ankle swelling, uncontrolled high or unusually low blood pressure, and/or palpitations. If you are    experiencing any of these symptoms, cancel your procedure and contact your primary care physician for an evaluation. ° °Remember:  °Regular Business hours are:  °Monday to Thursday 8:00 AM to 4:00 PM ° °Provider's Schedule: °Emmanuel Gruenhagen, MD:  °Procedure days: Tuesday and Thursday 7:30 AM to 4:00 PM ° °Bilal Lateef, MD:  °Procedure days: Monday and Wednesday 7:30 AM to 4:00 PM °______________________________________________________________________ ° ____________________________________________________________________________________________ ° °General Risks and Possible Complications ° °Patient Responsibilities: It is important that you read this as it is part of your informed consent. It is our duty to inform you of the risks and possible complications associated with treatments offered to you. It is your responsibility as a patient to read this and to ask questions about anything that is not clear or that you believe was not covered in this document. ° °Patient’s Rights: You have the right to refuse treatment. You also have the right to change your mind, even after initially having agreed to have the treatment done. However, under this last option, if you wait until the last second to change your mind, you may be charged for the materials used up to that point. ° °Introduction: Medicine is not an exact science. Everything in Medicine, including the lack of treatment(s), carries the potential for danger, harm, or loss (which is by definition: Risk). In Medicine, a complication is a secondary problem, condition, or disease that can aggravate an already existing one. All treatments carry the risk of possible complications. The fact that a side effects or complications occurs, does not imply that the treatment was conducted incorrectly. It must be clearly understood that these can happen even when everything is done following the highest safety standards. ° °No treatment: You can choose not to proceed with the  proposed treatment alternative. The “PRO(s)” would include: avoiding the risk of complications associated with the therapy. The “CON(s)” would include: not getting any of the treatment benefits. These benefits fall under one of three categories: diagnostic; therapeutic; and/or palliative. Diagnostic benefits include: getting information which can ultimately lead to improvement of the disease or symptom(s). Therapeutic benefits are those associated with the successful treatment of the disease. Finally, palliative benefits are those related to the decrease of the primary symptoms, without necessarily curing the condition (example: decreasing the pain from a flare-up of a chronic condition, such as incurable terminal cancer). ° °General Risks and Complications: These are associated to most interventional treatments. They can occur alone, or in combination. They fall under one of the following six (6) categories: no benefit or worsening of symptoms; bleeding; infection; nerve damage; allergic reactions; and/or death. °No benefits or worsening of symptoms: In Medicine there are no guarantees, only probabilities. No healthcare provider can ever guarantee that a medical treatment will work, they can only state the probability that it may. Furthermore, there is always the possibility that the condition may worsen, either directly, or indirectly, as a consequence of the treatment. °Bleeding: This is more common if the patient is taking a blood thinner, either prescription or over the counter (example: Goody Powders, Fish oil, Aspirin, Garlic, etc.), or if suffering a condition associated with impaired coagulation (example: Hemophilia, cirrhosis of the liver, low platelet counts, etc.). However, even if you do not have one on these, it can still happen. If you have any of these conditions, or take one of these drugs, make sure to notify your treating physician. °Infection: This is more common in patients with a compromised  immune system, either due to disease (example:   diabetes, cancer, human immunodeficiency virus [HIV], etc.), or due to medications or treatments (example: therapies used to treat cancer and rheumatological diseases). However, even if you do not have one on these, it can still happen. If you have any of these conditions, or take one of these drugs, make sure to notify your treating physician. °Nerve Damage: This is more common when the treatment is an invasive one, but it can also happen with the use of medications, such as those used in the treatment of cancer. The damage can occur to small secondary nerves, or to large primary ones, such as those in the spinal cord and brain. This damage may be temporary or permanent and it may lead to impairments that can range from temporary numbness to permanent paralysis and/or brain death. °Allergic Reactions: Any time a substance or material comes in contact with our body, there is the possibility of an allergic reaction. These can range from a mild skin rash (contact dermatitis) to a severe systemic reaction (anaphylactic reaction), which can result in death. °Death: In general, any medical intervention can result in death, most of the time due to an unforeseen complication. °____________________________________________________________________________________________  °

## 2021-11-07 ENCOUNTER — Ambulatory Visit: Payer: Medicare Other | Admitting: Internal Medicine

## 2021-11-11 ENCOUNTER — Encounter: Admitting: Pain Medicine

## 2021-11-14 ENCOUNTER — Encounter: Payer: Self-pay | Admitting: Pain Medicine

## 2021-11-14 ENCOUNTER — Ambulatory Visit (HOSPITAL_BASED_OUTPATIENT_CLINIC_OR_DEPARTMENT_OTHER): Payer: Medicare Other | Admitting: Pain Medicine

## 2021-11-14 ENCOUNTER — Ambulatory Visit
Admission: RE | Admit: 2021-11-14 | Discharge: 2021-11-14 | Disposition: A | Discharge status: Home / Self Care | Payer: Medicare Other | Source: Ambulatory Visit | Attending: Pain Medicine | Admitting: Pain Medicine

## 2021-11-14 VITALS — BP 135/76 | HR 73 | Temp 97.2°F | Resp 14 | Ht 64.0 in | Wt 242.0 lb

## 2021-11-14 DIAGNOSIS — M4310 Spondylolisthesis, site unspecified: Secondary | ICD-10-CM | POA: Diagnosis present

## 2021-11-14 DIAGNOSIS — M545 Low back pain, unspecified: Secondary | ICD-10-CM | POA: Diagnosis present

## 2021-11-14 DIAGNOSIS — G8929 Other chronic pain: Secondary | ICD-10-CM | POA: Insufficient documentation

## 2021-11-14 DIAGNOSIS — M47817 Spondylosis without myelopathy or radiculopathy, lumbosacral region: Secondary | ICD-10-CM | POA: Insufficient documentation

## 2021-11-14 DIAGNOSIS — M5136 Other intervertebral disc degeneration, lumbar region: Secondary | ICD-10-CM | POA: Diagnosis present

## 2021-11-14 DIAGNOSIS — M47816 Spondylosis without myelopathy or radiculopathy, lumbar region: Secondary | ICD-10-CM

## 2021-11-14 MED ORDER — PENTAFLUOROPROP-TETRAFLUOROETH EX AERO
INHALATION_SPRAY | Freq: Once | CUTANEOUS | Status: DC
  Filled 2021-11-14: qty 116

## 2021-11-14 MED ORDER — TRIAMCINOLONE ACETONIDE 40 MG/ML IJ SUSP
80.0000 mg | Freq: Once | INTRAMUSCULAR | Status: AC
  Administered 2021-11-14: 80 mg
  Filled 2021-11-14: qty 2

## 2021-11-14 MED ORDER — MIDAZOLAM HCL 5 MG/5ML IJ SOLN
0.5000 mg | Freq: Once | INTRAMUSCULAR | Status: AC
  Administered 2021-11-14: 2 mg via INTRAVENOUS
  Filled 2021-11-14: qty 5

## 2021-11-14 MED ORDER — LACTATED RINGERS IV SOLN
1000.0000 mL | Freq: Once | INTRAVENOUS | Status: AC
  Administered 2021-11-14: 1000 mL via INTRAVENOUS

## 2021-11-14 MED ORDER — LIDOCAINE HCL 2 % IJ SOLN
20.0000 mL | Freq: Once | INTRAMUSCULAR | Status: AC
  Administered 2021-11-14: 400 mg
  Filled 2021-11-14: qty 20

## 2021-11-14 MED ORDER — ROPIVACAINE HCL 2 MG/ML IJ SOLN
18.0000 mL | Freq: Once | INTRAMUSCULAR | Status: AC
  Administered 2021-11-14: 18 mL via PERINEURAL
  Filled 2021-11-14: qty 20

## 2021-11-14 NOTE — Progress Notes (Signed)
PROVIDER NOTE: Interpretation of information contained herein should be left to medically-trained personnel. Specific patient instructions are provided elsewhere under "Patient Instructions" section of medical record. This document was created in part using STT-dictation technology, any transcriptional errors that may result from this process are unintentional.  ?Patient: Regina Campos ?Type: Established ?DOB: [birth year] ?MRN: [id number] ?PCP: Lenard Simmer, MD  Service: Procedure ?DOS: 11/14/2021 ?Setting: Ambulatory ?Location: Ambulatory outpatient facility ?Delivery: Face-to-face Provider: Gaspar Cola, MD ?Specialty: Interventional Pain Management ?Specialty designation: 09 ?Location: Outpatient facility ?Ref. Prov.: Morayati, Lourdes Sledge, MD   ? ?Primary Reason for Visit: Interventional Pain Management Treatment. ?CC: Back Pain (lower) ?  ?Procedure:          ? Type: Lumbar Facet, Medial Branch Block(s) #1  ?Laterality: Bilateral  ?Level: L2, L3, L4, L5, & S1 Medial Branch Level(s). Injecting these levels blocks the L3-4 and L5-S1 lumbar facet joints. ?Imaging: Fluoroscopic guidance ?Anesthesia: Local anesthesia (1-2% Lidocaine) ?Anxiolysis: IV Versed 2.0 mg ?Sedation: None. ?DOS: 11/14/2021 ?Performed by: Gaspar Cola, MD ? ?Primary Purpose: Diagnostic/Therapeutic ?Indications: Low back pain severe enough to impact quality of life or function. ?1. Lumbar facet syndrome (Bilateral)   ?2. Grade 1 (34m) Retrolisthesis of L3/L4   ?3. Spondylosis without myelopathy or radiculopathy, lumbosacral region   ?4. Chronic low back pain (Bilateral) w/o sciatica   ?5. Lumbar facet arthropathy (Multilevel) (Bilateral)   ?6. DDD (degenerative disc disease), lumbar   ? ?NAS-11 Pain score:  ? Pre-procedure: 6 /10  ? Post-procedure: 0-No pain/10  ? ?  ?Position / Prep / Materials:  ?Position: Prone  ?Prep solution: DuraPrep (Iodine Povacrylex [0.7% available iodine] and Isopropyl Alcohol, 74% w/w) ?Area Prepped:  Posterolateral Lumbosacral Spine (Wide prep: From the lower border of the scapula down to the end of the tailbone and from flank to flank.) ? ?Materials:  ?Tray: Block ?Needle(s):  ?Type: Spinal  ?Gauge (G): 22  ?Length: 5-in ?Qty: 4 ? ?Pre-op H&P Assessment:  ?Regina Campos a 71y.o. (year old), female patient, seen today for interventional treatment. She  has a past surgical history that includes Abdominal hysterectomy; Total shoulder replacement; Replacement total knee; Appendectomy; Lumbar fusion; Carpal tunnel release; Back surgery (07/25/2016); Cataract extraction w/PHACO (Right, 10/13/2019); Cataract extraction w/PHACO (Left, 11/24/2019); Total thyroidectomy; Colonoscopy (N/A, 10/21/2021); and Esophagogastroduodenoscopy (egd) with propofol (N/A, 10/21/2021). Regina Campos a current medication list which includes the following prescription(s): albuterol, budesonide-formoterol, vitamin d3, fenofibrate, furosemide, ipratropium-albuterol, levothyroxine, naloxone, NON FORMULARY, oxygen-helium, potassium chloride, tramadol, ubrogepant, gabapentin, and meloxicam, and the following Facility-Administered Medications: pentafluoroprop-tetrafluoroeth. Her primarily concern today is the Back Pain (lower) ? ?Initial Vital Signs:  ?Pulse/HCG Rate: 73ECG Heart Rate: 69 ?Temp: (!) 97.2 ?F (36.2 ?C) ?Resp: 18 ?BP: (!) 152/81 ?SpO2: 100 % ? ?BMI: Estimated body mass index is 41.54 kg/m? as calculated from the following: ?  Height as of this encounter: '5\' 4"'$  (1.626 m). ?  Weight as of this encounter: 242 lb (109.8 kg). ? ?Risk Assessment: ?Allergies: Reviewed. She has No Known Allergies.  ?Allergy Precautions: None required ?Coagulopathies: Reviewed. None identified.  ?Blood-thinner therapy: None at this time ?Active Infection(s): Reviewed. None identified. Regina Campos afebrile ? ?Site Confirmation: Ms. MDuffnerwas asked to confirm the procedure and laterality before marking the site ?Procedure checklist: Completed ?Consent:  Before the procedure and under the influence of no sedative(s), amnesic(s), or anxiolytics, the patient was informed of the treatment options, risks and possible complications. To fulfill our ethical and legal obligations, as recommended by  the American Medical Association's Code of Ethics, I have informed the patient of my clinical impression; the nature and purpose of the treatment or procedure; the risks, benefits, and possible complications of the intervention; the alternatives, including doing nothing; the risk(s) and benefit(s) of the alternative treatment(s) or procedure(s); and the risk(s) and benefit(s) of doing nothing. ?The patient was provided information about the general risks and possible complications associated with the procedure. These may include, but are not limited to: failure to achieve desired goals, infection, bleeding, organ or nerve damage, allergic reactions, paralysis, and death. ?In addition, the patient was informed of those risks and complications associated to Spine-related procedures, such as failure to decrease pain; infection (i.e.: Meningitis, epidural or intraspinal abscess); bleeding (i.e.: epidural hematoma, subarachnoid hemorrhage, or any other type of intraspinal or peri-dural bleeding); organ or nerve damage (i.e.: Any type of peripheral nerve, nerve root, or spinal cord injury) with subsequent damage to sensory, motor, and/or autonomic systems, resulting in permanent pain, numbness, and/or weakness of one or several areas of the body; allergic reactions; (i.e.: anaphylactic reaction); and/or death. ?Furthermore, the patient was informed of those risks and complications associated with the medications. These include, but are not limited to: allergic reactions (i.e.: anaphylactic or anaphylactoid reaction(s)); adrenal axis suppression; blood sugar elevation that in diabetics may result in ketoacidosis or comma; water retention that in patients with history of congestive heart  failure may result in shortness of breath, pulmonary edema, and decompensation with resultant heart failure; weight gain; swelling or edema; medication-induced neural toxicity; particulate matter embolism and blood vessel occlusion with resultant organ, and/or nervous system infarction; and/or aseptic necrosis of one or more joints. ?Finally, the patient was informed that Medicine is not an exact science; therefore, there is also the possibility of unforeseen or unpredictable risks and/or possible complications that may result in a catastrophic outcome. The patient indicated having understood very clearly. We have given the patient no guarantees and we have made no promises. Enough time was given to the patient to ask questions, all of which were answered to the patient's satisfaction. Ms. Stickle has indicated that she wanted to continue with the procedure. ?Attestation: I, the ordering provider, attest that I have discussed with the patient the benefits, risks, side-effects, alternatives, likelihood of achieving goals, and potential problems during recovery for the procedure that I have provided informed consent. ?Date  Time: 11/14/2021  7:57 AM ? ?Pre-Procedure Preparation:  ?Monitoring: As per clinic protocol. Respiration, ETCO2, SpO2, BP, heart rate and rhythm monitor placed and checked for adequate function ?Safety Precautions: Patient was assessed for positional comfort and pressure points before starting the procedure. ?Time-out: I initiated and conducted the "Time-out" before starting the procedure, as per protocol. The patient was asked to participate by confirming the accuracy of the "Time Out" information. Verification of the correct person, site, and procedure were performed and confirmed by me, the nursing staff, and the patient. "Time-out" conducted as per Joint Commission's Universal Protocol ([email or web address].01.01.01). ?Time: 4235 ? ?Description of Procedure:          ?Laterality: Bilateral. The procedure was  performed in identical fashion on both sides. ?Targeted Levels:  L2, L3, L4, L5, & S1 Medial Branch Level(s) ? ?Safety Precautions: Aspiration looking for blood return was conducted prior to all injectio

## 2021-11-14 NOTE — Patient Instructions (Signed)
____________________________________________________________________________________________ ? ?Virtual Visits  ? ?What is a Manufacturing systems engineer Visit"? ?It is a Metallurgist (medical visit) that takes place on real time (NOT TEXT or E-MAIL) over the telephone or computer device (desktop, laptop, tablet, smart phone, etc.). It allows for more location flexibility between the patient and the healthcare provider. ? ?Who decides when these types of visits will be used? ?The physician. ? ?Who is eligible for these types of visits? ?Only those patients that can be reliably reached over the telephone. ? ?What do you mean by reliably? ?We do not have time to call everyone multiple times, therefore those that tend to screen calls and then call back later are not suitable candidates for this system. We understand how people are reluctant to pickup on "unknown" calls, therefore, we suggest adding our telephone numbers to your list of "CONTACT(s)". This way, you should be able to readily identify our calls when you receive one. All of our numbers are available below.  ? ?Who is not eligible? ?This option is not available for medication management encounters, specially for controlled substances. Patients on pain medications that fall under the category of controlled substances have to come in for "Face-to-Face" encounters. This is required for mandatory monitoring of these substances. You may be asked to provide a sample for an unannounced urine drug screening test (UDS), and we will need to count your pain pills. Not bringing your pills to be counted may result in no refill. Obviously, neither one of these can be done over the phone. ? ?When will this type of visits be used? ?You can request a virtual visit whenever you are physically unable to attend a regular appointment. The decision will be made by the physician (or healthcare provider) on a case by case basis.  ? ?At what time will I be called? ?This is an  excellent question. The providers will try to call you whenever they have time available. Do not expect to be called at any specific time. The secretaries will assign you a time for your virtual visit appointment, but this is done simply to keep a list of those patients that need to be called, but not for the purpose of keeping a time schedule. Be advised that the call may come in anytime during the day, between the hours of 8:00 AM and 8::00 PM, depending on provider availability. We do understand that the system is not perfect. If you are unable to be available that day on a moments notice, then request an "in-person" appointment rather than a "virtual visit". ? ?Can I request my medication visits to be "Virtual"? ?Yes you may request it, but the decision is entirely up to the healthcare provider. Control substances require specific monitoring that requires Face-to-Face encounters. The number of encounters  and the extent of the monitoring is determined on a case by case basis. ? ?Add a new contact to your smart phone and label it "PAIN CLINIC" ?Under this contact add the following numbers: ?Main: [phone] (Official Contact Number) ?Nurses: [phone] (These are outgoing only calling systems. Do not call this number.) ?Regina Campos: [phone] or [phone] (Outgoing calls only. Do not call this number.) ? ?____________________________________________________________________________________________ ? ____________________________________________________________________________________________ ? ?Post-Procedure Discharge Instructions ? ?Instructions: ?Apply ice:  ?Purpose: This will minimize any swelling and discomfort after procedure.  ?When: Day of procedure, as soon as you get home. ?How: Fill a plastic sandwich bag with crushed ice. Cover it with a small towel and apply to injection  site. ?How long: (15 min on, 15 min off) Apply for 15 minutes then remove x 15 minutes.  Repeat sequence on day of  procedure, until you go to bed. ?Apply heat:  ?Purpose: To treat any soreness and discomfort from the procedure. ?When: Starting the next day after the procedure. ?How: Apply heat to procedure site starting the day following the procedure. ?How long: May continue to repeat daily, until discomfort goes away. ?Food intake: Start with clear liquids (like water) and advance to regular food, as tolerated.  ?Physical activities: Keep activities to a minimum for the first 8 hours after the procedure. After that, then as tolerated. ?Driving: If you have received any sedation, be responsible and do not drive. You are not allowed to drive for 24 hours after having sedation. ?Blood thinner: (Applies only to those taking blood thinners) You may restart your blood thinner 6 hours after your procedure. ?Insulin: (Applies only to Diabetic patients taking insulin) As soon as you can eat, you may resume your normal dosing schedule. ?Infection prevention: Keep procedure site clean and dry. Shower daily and clean area with soap and water. ?Post-procedure Pain Diary: Extremely important that this be done correctly and accurately. Recorded information will be used to determine the next step in treatment. For the purpose of accuracy, follow these rules: ?Evaluate only the area treated. Do not report or include pain from an untreated area. For the purpose of this evaluation, ignore all other areas of pain, except for the treated area. ?After your procedure, avoid taking a long nap and attempting to complete the pain diary after you wake up. Instead, set your alarm clock to go off every hour, on the hour, for the initial 8 hours after the procedure. Document the duration of the numbing medicine, and the relief you are getting from it. ?Do not go to sleep and attempt to complete it later. It will not be accurate. If you received sedation, it is likely that you were given a medication that may cause amnesia. Because of this, completing the  diary at a later time may cause the information to be inaccurate. This information is needed to plan your care. ?Follow-up appointment: Keep your post-procedure follow-up evaluation appointment after the procedure (usually 2 weeks for most procedures, 6 weeks for radiofrequencies). DO NOT FORGET to bring you pain diary with you.  ? ?Expect: (What should I expect to see with my procedure?) ?From numbing medicine (AKA: Local Anesthetics): Numbness or decrease in pain. You may also experience some weakness, which if present, could last for the duration of the local anesthetic. ?Onset: Full effect within 15 minutes of injected. ?Duration: It will depend on the type of local anesthetic used. On the average, 1 to 8 hours.  ?From steroids (Applies only if steroids were used): Decrease in swelling or inflammation. Once inflammation is improved, relief of the pain will follow. ?Onset of benefits: Depends on the amount of swelling present. The more swelling, the longer it will take for the benefits to be seen. In some cases, up to 10 days. ?Duration: Steroids will stay in the system x 2 weeks. Duration of benefits will depend on multiple posibilities including persistent irritating factors. ?Side-effects: If present, they may typically last 2 weeks (the duration of the steroids). ?Frequent: Cramps (if they occur, drink Gatorade and take over-the-counter Magnesium 450-500 mg once to twice a day); water retention with temporary weight gain; increases in blood sugar; decreased immune system response; increased appetite. ?Occasional: Facial flushing (red, warm  cheeks); mood swings; menstrual changes. ?Uncommon: Long-term decrease or suppression of natural hormones; bone thinning. (These are more common with higher doses or more frequent use. This is why we prefer that our patients avoid having any injection therapies in other practices.)  ?Very Rare: Severe mood changes; psychosis; aseptic necrosis. ?From procedure: Some  discomfort is to be expected once the numbing medicine wears off. This should be minimal if ice and heat are applied as instructed. ? ?Call if: (When should I call?) ?You experience numbness and weakness that get

## 2021-11-15 ENCOUNTER — Telehealth: Payer: Self-pay | Admitting: *Deleted

## 2021-11-15 NOTE — Telephone Encounter (Signed)
Post procedure call; no questions or concerns.  States she is doing well.  ?

## 2021-11-26 ENCOUNTER — Telehealth: Payer: Self-pay

## 2021-11-26 NOTE — Telephone Encounter (Signed)
LM to call for virtual  appointment questions.   ?

## 2021-11-27 ENCOUNTER — Other Ambulatory Visit: Payer: Self-pay | Admitting: Pain Medicine

## 2021-11-27 ENCOUNTER — Ambulatory Visit: Payer: Medicare Other | Attending: Pain Medicine | Admitting: Pain Medicine

## 2021-11-27 DIAGNOSIS — M47816 Spondylosis without myelopathy or radiculopathy, lumbar region: Secondary | ICD-10-CM

## 2021-11-27 DIAGNOSIS — Z91199 Patient's noncompliance with other medical treatment and regimen due to unspecified reason: Secondary | ICD-10-CM

## 2021-11-27 NOTE — Progress Notes (Signed)
Unsuccessful attempt to contact patient for Virtual Visit (Pain Management Telehealth)  ? ?Patient provided contact information:  ?[phone] (home); [phone] (mobile); (Preferred) [phone] ?[email or web address]  ? ?Pre-screening:  ?Our staff was successful in contacting Regina Campos using the above provided information.  ? ?I unsuccessfully attempted to make contact with Regina Campos on 3 different locations on 11/27/2021 via telephone. I was unable to complete the virtual encounter due to call going directly to voicemail. I was able to leave a message. ? ?Post-procedure evaluation  ? Type: Lumbar Facet, Medial Branch Block(s) #1  ?Laterality: Bilateral  ?Level: L2, L3, L4, L5, & S1 Medial Branch Level(s). Injecting these levels blocks the L3-4 and L5-S1 lumbar facet joints. ?Imaging: Fluoroscopic guidance ?Anesthesia: Local anesthesia (1-2% Lidocaine) ?Anxiolysis: IV Versed 2.0 mg ?Sedation: None. ?DOS: 11/14/2021 ?Performed by: Gaspar Cola, MD ? ?Primary Purpose: Diagnostic/Therapeutic ?Indications: Low back pain severe enough to impact quality of life or function. ?1. Lumbar facet syndrome (Bilateral)   ?2. Grade 1 (31m) Retrolisthesis of L3/L4   ?3. Spondylosis without myelopathy or radiculopathy, lumbosacral region   ?4. Chronic low back pain (Bilateral) w/o sciatica   ?5. Lumbar facet arthropathy (Multilevel) (Bilateral)   ?6. DDD (degenerative disc disease), lumbar   ? ?NAS-11 Pain score:  ? Pre-procedure: 6 /10  ? Post-procedure: 0-No pain/10  ? ?   ?Effectiveness:  ?Initial hour after procedure: 100 %. ?Subsequent 4-6 hours post-procedure: 100 %. ?Analgesia past initial 6 hours: 80 %. ? ?Pharmacotherapy Assessment  ?Analgesic: Tramadol 50 mg, 2 tabs PO q 6 hrs (400 mg/day of tramadol) ?MME/day: 40 mg/day.  ? ?Follow-up plan:   ?Reschedule Visit.  ?  Interventional Therapies  ?Risk  Complexity Considerations:   ?Estimated body mass index is 39.94 kg/m? as calculated from the  following: ?  Height as of 04/02/21: '5\' 5"'$  (1.651 m). ?  Weight as of 04/02/21: 240 lb (108.9 kg). ?WNL  ? ?Planned  Pending:   ?  ? ?Under consideration:   ?Diagnostic bilateral lumbar facet MBB #2   ? ?Completed:   ?Diagnostic bilateral lumbar facet MBB x1 (11/14/2021) (100/100/80)  ?Therapeutic midline/right L2-3 LESI x4 (10/08/2021) (100/100/90/100)  ?Diagnostic/therapeutic bilateral foot 4th TMT steroid injection x2 (03/01/2020) (100/100/80/>75)  ?Diagnostic/therapeutic left intercostal NB of ribs 5-8 x1 (02/07/2020) (100/100/75/85)  ?Diagnostic right dorsal tarsometatarsal joint #4 injection x1 (12/31/2017)  ?Palliative right L3-4 LESI x2 (09/20/2015)  ?Palliative right L4 TFESI x2 (09/20/2015)  ?Palliative right C7-T1 CESI x1 (07/03/2015)  ?Palliative left IA shoulder joint inj. x1 (05/12/2017)  ?Palliative right suprascapular NB x1 (11/19/2016)  ?Palliative left suprascapular NB x2 (06/04/2017)  ?Palliative bilateral IA Hip injection x1 (10/01/2016)  ?Therapeutic right ring finger (#4) A-1 pulley, trigger finger sheath inj. x1 (04/07/2017)  ?Diagnostic bilateral dorsolateral Cuboid and 5th PMC, foot inj. x1 (12/03/2017)   ? ?Therapeutic  Palliative (PRN) options:   ?Palliative right L2-3 LESI #5  ?Palliative right C7-T1 CESI #2  ?Therapeutic right finger #4 (ring) A-1 pulley area, trigger finger tendon sheath injection #2  ?Diagnostic bilateral dorsolateral junction of Cuboid and 5th PMC, foot steroid injection #2   ?  ?Recent Visits ?Date Type Provider Dept  ?11/14/21 Procedure visit NMilinda Pointer MD [facility]  ?10/22/21 Office Visit NMilinda Pointer MD [facility]  ?10/08/21 Procedure visit NMilinda Pointer MD [facility]  ?10/02/21 Office Visit NMilinda Pointer MD [facility]  ?Showing recent visits within past 90 days and meeting all other requirements ?Today's Visits ?Date Type Provider Dept  ?  11/27/21 Office Visit Milinda Pointer, MD [facility]  ?Showing today's visits and meeting all other requirements ?Future Appointments ?Date Type Provider Dept  ?12/25/21 Appointment Milinda Pointer, MD [facility]  ?Showing future appointments within next 90 days and meeting all other requirements ? ? ?Note by: Gaspar Cola, MD ?Date: 11/27/2021; Time: 4:08 PM ?

## 2021-12-16 ENCOUNTER — Encounter: Payer: Self-pay | Admitting: *Deleted

## 2021-12-17 ENCOUNTER — Ambulatory Visit: Payer: Medicare Other | Admitting: Anesthesiology

## 2021-12-17 ENCOUNTER — Encounter
Admission: RE | Disposition: A | Discharge status: Home / Self Care | Payer: Self-pay | Source: Home / Self Care | Attending: Gastroenterology

## 2021-12-17 ENCOUNTER — Ambulatory Visit
Admission: RE | Admit: 2021-12-17 | Discharge: 2021-12-17 | Disposition: A | Discharge status: Home / Self Care | Payer: Medicare Other | Attending: Gastroenterology | Admitting: Gastroenterology

## 2021-12-17 DIAGNOSIS — J449 Chronic obstructive pulmonary disease, unspecified: Secondary | ICD-10-CM | POA: Insufficient documentation

## 2021-12-17 DIAGNOSIS — Z1211 Encounter for screening for malignant neoplasm of colon: Secondary | ICD-10-CM | POA: Insufficient documentation

## 2021-12-17 DIAGNOSIS — G709 Myoneural disorder, unspecified: Secondary | ICD-10-CM | POA: Insufficient documentation

## 2021-12-17 DIAGNOSIS — G4733 Obstructive sleep apnea (adult) (pediatric): Secondary | ICD-10-CM | POA: Diagnosis not present

## 2021-12-17 DIAGNOSIS — E039 Hypothyroidism, unspecified: Secondary | ICD-10-CM | POA: Insufficient documentation

## 2021-12-17 DIAGNOSIS — Z9049 Acquired absence of other specified parts of digestive tract: Secondary | ICD-10-CM | POA: Diagnosis not present

## 2021-12-17 DIAGNOSIS — K635 Polyp of colon: Secondary | ICD-10-CM | POA: Insufficient documentation

## 2021-12-17 DIAGNOSIS — Z8673 Personal history of transient ischemic attack (TIA), and cerebral infarction without residual deficits: Secondary | ICD-10-CM | POA: Diagnosis not present

## 2021-12-17 DIAGNOSIS — D123 Benign neoplasm of transverse colon: Secondary | ICD-10-CM | POA: Diagnosis not present

## 2021-12-17 DIAGNOSIS — D120 Benign neoplasm of cecum: Secondary | ICD-10-CM | POA: Diagnosis not present

## 2021-12-17 DIAGNOSIS — Z90710 Acquired absence of both cervix and uterus: Secondary | ICD-10-CM | POA: Insufficient documentation

## 2021-12-17 DIAGNOSIS — D122 Benign neoplasm of ascending colon: Secondary | ICD-10-CM | POA: Insufficient documentation

## 2021-12-17 DIAGNOSIS — Q438 Other specified congenital malformations of intestine: Secondary | ICD-10-CM | POA: Diagnosis not present

## 2021-12-17 DIAGNOSIS — D124 Benign neoplasm of descending colon: Secondary | ICD-10-CM | POA: Diagnosis not present

## 2021-12-17 HISTORY — PX: COLONOSCOPY WITH PROPOFOL: SHX5780

## 2021-12-17 HISTORY — DX: Dependence on supplemental oxygen: Z99.81

## 2021-12-17 HISTORY — DX: Depression, unspecified: F32.A

## 2021-12-17 HISTORY — DX: Fibromyalgia: M79.7

## 2021-12-17 HISTORY — DX: Malignant (primary) neoplasm, unspecified: C80.1

## 2021-12-17 HISTORY — DX: Headache, unspecified: R51.9

## 2021-12-17 SURGERY — COLONOSCOPY WITH PROPOFOL
Anesthesia: General

## 2021-12-17 MED ORDER — LIDOCAINE HCL (CARDIAC) PF 100 MG/5ML IV SOSY
PREFILLED_SYRINGE | INTRAVENOUS | Status: DC | PRN
  Administered 2021-12-17: 100 mg via INTRAVENOUS

## 2021-12-17 MED ORDER — PROPOFOL 500 MG/50ML IV EMUL
INTRAVENOUS | Status: AC
  Filled 2021-12-17: qty 100

## 2021-12-17 MED ORDER — STERILE WATER FOR IRRIGATION IR SOLN
Status: DC | PRN
  Administered 2021-12-17: 60 mL

## 2021-12-17 MED ORDER — PROPOFOL 10 MG/ML IV BOLUS
INTRAVENOUS | Status: DC | PRN
  Administered 2021-12-17: 70 mg via INTRAVENOUS

## 2021-12-17 MED ORDER — LIDOCAINE HCL (PF) 2 % IJ SOLN
INTRAMUSCULAR | Status: AC
  Filled 2021-12-17: qty 10

## 2021-12-17 MED ORDER — SODIUM CHLORIDE 0.9 % IV SOLN: INTRAVENOUS | Status: DC

## 2021-12-17 MED ORDER — PROPOFOL 500 MG/50ML IV EMUL
INTRAVENOUS | Status: DC | PRN
  Administered 2021-12-17: 140 ug/kg/min via INTRAVENOUS

## 2021-12-17 MED ORDER — EPHEDRINE 5 MG/ML INJ
INTRAVENOUS | Status: AC
  Filled 2021-12-17: qty 5

## 2021-12-17 MED ORDER — EPHEDRINE SULFATE (PRESSORS) 50 MG/ML IJ SOLN
INTRAMUSCULAR | Status: DC | PRN
Start: 2021-12-17 — End: 2021-12-17
  Administered 2021-12-17: 5 mg via INTRAVENOUS

## 2021-12-17 MED ORDER — PHENYLEPHRINE 80 MCG/ML (10ML) SYRINGE FOR IV PUSH (FOR BLOOD PRESSURE SUPPORT)
PREFILLED_SYRINGE | INTRAVENOUS | Status: AC
  Filled 2021-12-17: qty 10

## 2021-12-17 NOTE — Op Note (Signed)
[facility] ?Gastroenterology ?Patient Name: Regina Campos ?Procedure Date: 12/17/2021 8:59 AM ?MRN: [id number] ?Account #: [id number] ?Date of Birth: [birth year] ?Admit Type: Outpatient ?Age: 71 ?Room: [facility] ENDO ROOM 1 ?Gender: Female ?Note Status: Finalized ?Instrument Name: Colonoscope [id number] ?Procedure:             Colonoscopy ?Indications:           Screening for colorectal malignant neoplasm ?Providers:             Andrey Farmer MD, MD ?Referring MD:          Lenard Simmer, MD (Referring MD) ?Medicines:             Monitored Anesthesia Care ?Complications:         No immediate complications. Estimated blood loss:  ?                       Minimal. ?Procedure:             Pre-Anesthesia Assessment: ?                       - Prior to the procedure, a History and Physical was  ?                       performed, and patient medications and allergies were  ?                       reviewed. The patient is competent. The risks and  ?                       benefits of the procedure and the sedation options and  ?                       risks were discussed with the patient. All questions  ?                       were answered and informed consent was obtained.  ?                       Patient identification and proposed procedure were  ?                       verified by the physician, the nurse, the  ?                       anesthesiologist, the anesthetist and the technician  ?                       in the endoscopy suite. Mental Status Examination:  ?                       alert and oriented. Airway Examination: normal  ?                       oropharyngeal airway and neck mobility. Respiratory  ?                       Examination: clear to auscultation. CV Examination:  ?  normal. Prophylactic Antibiotics: The patient does not  ?                       require prophylactic antibiotics. Prior  ?                       Anticoagulants: The patient has taken no previous  ?                        anticoagulant or antiplatelet agents. ASA Grade  ?                       Assessment: III - A patient with severe systemic  ?                       disease. After reviewing the risks and benefits, the  ?                       patient was deemed in satisfactory condition to  ?                       undergo the procedure. The anesthesia plan was to use  ?                       monitored anesthesia care (MAC). Immediately prior to  ?                       administration of medications, the patient was  ?                       re-assessed for adequacy to receive sedatives. The  ?                       heart rate, respiratory rate, oxygen saturations,  ?                       blood pressure, adequacy of pulmonary ventilation, and  ?                       response to care were monitored throughout the  ?                       procedure. The physical status of the patient was  ?                       re-assessed after the procedure. ?                       After obtaining informed consent, the colonoscope was  ?                       passed under direct vision. Throughout the procedure,  ?                       the patient's blood pressure, pulse, and oxygen  ?                       saturations were monitored continuously. The  ?  Colonoscope was introduced through the anus and  ?                       advanced to the the cecum, identified by appendiceal  ?                       orifice and ileocecal valve. The colonoscopy was  ?                       somewhat difficult due to significant looping and an  ?                       endoscope malfunction. Successful completion of the  ?                       procedure was aided by applying abdominal pressure.  ?                       The patient tolerated the procedure well. The quality  ?                       of the bowel preparation was fair. ?Findings: ?     The perianal and digital rectal examinations were normal. ?     A 1 mm polyp  was found in the cecum. The polyp was sessile. The polyp  ?     was removed with a jumbo cold forceps. Resection and retrieval were  ?     complete. Estimated blood loss was minimal. ?     A 3 mm polyp was found in the cecum. The polyp was sessile. The polyp  ?     was removed with a cold snare. Resection and retrieval were complete.  ?     Estimated blood loss was minimal. ?     Three sessile polyps were found in the ascending colon. The polyps were  ?     2 to 8 mm in size. These polyps were removed with a cold snare.  ?     Resection and retrieval were complete. Estimated blood loss was minimal. ?     Two sessile polyps were found in the transverse colon. The polyps were 3  ?     to 4 mm in size. These polyps were removed with a cold snare. Resection  ?     and retrieval were complete. Estimated blood loss was minimal. ?     A 2 mm polyp was found in the descending colon. The polyp was sessile.  ?     The polyp was removed with a cold snare. Resection and retrieval were  ?     complete. Estimated blood loss was minimal. ?     Two sessile polyps were found in the descending colon. The polyps were 1  ?     to 2 mm in size. These polyps were removed with a jumbo cold forceps.  ?     Resection and retrieval were complete. Estimated blood loss was minimal. ?     A 2 mm polyp was found in the descending colon. The polyp was sessile.  ?     Polypectomy was not attempted due to suction on the scope was clogged  ?     with seeds from prep. ?Impression:            -  Preparation of the colon was fair. ?                       - One 1 mm polyp in the cecum, removed with a jumbo  ?                       cold forceps. Resected and retrieved. ?                       - One 3 mm polyp in the cecum, removed with a cold  ?                       snare. Resected and retrieved. ?                       - Three 2 to 8 mm polyps in the ascending colon,  ?                       removed with a cold snare. Resected and retrieved. ?                        - Two 3 to 4 mm polyps in the transverse colon,  ?                       removed with a cold snare. Resected and retrieved. ?                       - One 2 mm polyp in the descending colon, removed with  ?                       a cold snare. Resected and retrieved. ?                       - Two 1 to 2 mm polyps in the descending colon,  ?                       removed with a jumbo cold forceps. Resected and  ?                       retrieved. ?                       - One 2 mm polyp in the descending colon. Resection  ?                       not attempted. ?Recommendation:        - Repeat colonoscopy in 6 months because the bowel  ?                       preparation was suboptimal. ?                       - Return to referring physician as previously  ?                       scheduled. ?                       -  Discharge patient to home. ?                       - Resume previous diet. ?                       - Continue present medications. ?                       - Await pathology results. ?                       - Return to referring physician as previously  ?                       scheduled. ?Procedure Code(s):     --- Professional --- ?                       [phone], Colonoscopy, flexible; with removal of  ?                       tumor(s), polyp(s), or other lesion(s) by snare  ?                       technique ?                       [id number], 59, Colonoscopy, flexible; with biopsy, single  ?                       or multiple ?Diagnosis Code(s):     --- Professional --- ?                       K63.5, Polyp of colon ?                       Z12.11, Encounter for screening for malignant neoplasm  ?                       of colon ?CPT copyright 2019 [employer]. All rights reserved. ?The codes documented in this report are preliminary and upon coder review may  ?be revised to meet current compliance requirements. ?Andrey Farmer MD, MD ?12/17/2021 9:58:44 AM ?Number of Addenda: 0 ?Note  Initiated On: 12/17/2021 8:59 AM ?Scope Withdrawal Time: 0 hours 21 minutes 22 seconds  ?Total Procedure Duration: 0 hours 33 minutes 53 seconds  ?Estimated Blood Loss:  Estimated blood loss was minimal. ?

## 2021-12-17 NOTE — Transfer of Care (Signed)
Immediate Anesthesia Transfer of Care Note ? ?Patient: Regina Campos ? ?Procedure(s) Performed: COLONOSCOPY WITH PROPOFOL ? ?Patient Location: PACU ? ?Anesthesia Type:General ? ?Level of Consciousness: awake, alert  and oriented ? ?Airway & Oxygen Therapy: Patient Spontanous Breathing ? ?Post-op Assessment: Report given to RN and Post -op Vital signs reviewed and stable ? ?Post vital signs: Reviewed and stable ? ?Last Vitals:  ?Vitals Value Taken Time  ?BP 127/68 12/17/21 0954  ?Temp 35.9 ?C 12/17/21 0954  ?Pulse 55 12/17/21 0955  ?Resp 14 12/17/21 0955  ?SpO2 99 % 12/17/21 0955  ?Vitals shown include unvalidated device data. ? ?Last Pain:  ?Vitals:  ? 12/17/21 0954  ?TempSrc: Temporal  ?PainSc:   ?   ? ?  ? ?Complications: No notable events documented. ?

## 2021-12-17 NOTE — Anesthesia Postprocedure Evaluation (Signed)
Anesthesia Post Note ? ?Patient: Regina Campos ? ?Procedure(s) Performed: COLONOSCOPY WITH PROPOFOL ? ?Patient location during evaluation: Endoscopy ?Anesthesia Type: General ?Level of consciousness: awake and alert ?Pain management: pain level controlled ?Vital Signs Assessment: post-procedure vital signs reviewed and stable ?Respiratory status: spontaneous breathing, nonlabored ventilation, respiratory function stable and patient connected to nasal cannula oxygen ?Cardiovascular status: blood pressure returned to baseline and stable ?Postop Assessment: no apparent nausea or vomiting ?Anesthetic complications: no ? ? ?No notable events documented. ? ? ?Last Vitals:  ?Vitals:  ? 12/17/21 1014 12/17/21 1019  ?BP: (!) 150/106 (!) 130/95  ?Pulse:  (!) 58  ?Resp: 14 13  ?Temp:    ?SpO2:  100%  ?  ?Last Pain:  ?Vitals:  ? 12/17/21 1019  ?TempSrc:   ?PainSc: 0-No pain  ? ? ?  ?  ?  ?  ?  ?  ? ?Precious Haws Saksham Akkerman ? ? ? ? ?
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
## 2021-12-17 NOTE — Interval H&P Note (Signed)
History and Physical Interval Note: ? ?12/17/2021 ?9:09 AM ? ?Regina Campos  has presented today for surgery, with the diagnosis of Colon cancer screening.  The various methods of treatment have been discussed with the patient and family. After consideration of risks, benefits and other options for treatment, the patient has consented to  Procedure(s): ?COLONOSCOPY WITH PROPOFOL (N/A) as a surgical intervention.  The patient's history has been reviewed, patient examined, no change in status, stable for surgery.  I have reviewed the patient's chart and labs.  Questions were answered to the patient's satisfaction.   ? ? ?Hilton Cork Addley Ballinger ? ?Ok to proceed with colonoscopy ?
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
## 2021-12-18 ENCOUNTER — Encounter: Payer: Self-pay | Admitting: Gastroenterology

## 2021-12-18 LAB — SURGICAL PATHOLOGY

## 2021-12-19 ENCOUNTER — Other Ambulatory Visit: Payer: Self-pay

## 2021-12-19 DIAGNOSIS — R0602 Shortness of breath: Secondary | ICD-10-CM

## 2021-12-25 ENCOUNTER — Encounter: Payer: Medicare Other | Admitting: Pain Medicine

## 2022-01-01 ENCOUNTER — Ambulatory Visit (INDEPENDENT_AMBULATORY_CARE_PROVIDER_SITE_OTHER): Payer: Medicare Other | Admitting: Internal Medicine

## 2022-01-01 DIAGNOSIS — R0602 Shortness of breath: Secondary | ICD-10-CM | POA: Diagnosis not present

## 2022-01-08 NOTE — Procedures (Signed)
[facility] [place], [id number]    Complete Pulmonary Function Testing Interpretation:  FINDINGS:  Forced vital capacity is normal.  FEV1 is normal.  FEV1 FVC ratio is normal.  Postbronchodilator there is no significant change in FEV1.  Total lung capacity is normal.  Residual volume is normal.  FRC is normal.  DLCO is within normal limits postbronchodilator there is no significant change in the FEV1 clinical improvement may occur in the absence of spirometric improvement  IMPRESSION:  This pulmonary function study is within normal limits.  Clinical correlation is recommended  Allyne Gee, MD [facility] Pulmonary Critical Care Medicine Sleep Medicine

## 2022-01-09 ENCOUNTER — Encounter: Payer: Self-pay | Admitting: Internal Medicine

## 2022-01-09 LAB — PULMONARY FUNCTION TEST

## 2022-01-16 ENCOUNTER — Ambulatory Visit: Payer: Medicare Other | Admitting: Internal Medicine

## 2022-01-27 ENCOUNTER — Encounter: Payer: Self-pay | Admitting: Internal Medicine

## 2022-01-27 ENCOUNTER — Ambulatory Visit (INDEPENDENT_AMBULATORY_CARE_PROVIDER_SITE_OTHER): Payer: Medicare Other | Admitting: Internal Medicine

## 2022-01-27 VITALS — BP 147/63 | HR 68 | Temp 98.1°F | Resp 16 | Ht 64.0 in | Wt 236.0 lb

## 2022-01-27 DIAGNOSIS — J449 Chronic obstructive pulmonary disease, unspecified: Secondary | ICD-10-CM

## 2022-01-27 DIAGNOSIS — Z7189 Other specified counseling: Secondary | ICD-10-CM | POA: Diagnosis not present

## 2022-01-27 DIAGNOSIS — G4733 Obstructive sleep apnea (adult) (pediatric): Secondary | ICD-10-CM | POA: Diagnosis not present
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
## 2022-01-27 NOTE — Patient Instructions (Signed)

## 2022-03-10 ENCOUNTER — Encounter (INDEPENDENT_AMBULATORY_CARE_PROVIDER_SITE_OTHER): Payer: Self-pay
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
## 2022-03-24 ENCOUNTER — Encounter: Payer: Self-pay | Admitting: Pain Medicine

## 2022-03-24 ENCOUNTER — Ambulatory Visit: Payer: Medicare Other | Attending: Pain Medicine | Admitting: Pain Medicine

## 2022-03-24 VITALS — BP 168/86 | HR 71 | Temp 97.2°F | Ht 64.0 in | Wt 236.0 lb

## 2022-03-24 DIAGNOSIS — M542 Cervicalgia: Secondary | ICD-10-CM | POA: Insufficient documentation

## 2022-03-24 DIAGNOSIS — M25511 Pain in right shoulder: Secondary | ICD-10-CM | POA: Diagnosis present

## 2022-03-24 DIAGNOSIS — Z79899 Other long term (current) drug therapy: Secondary | ICD-10-CM | POA: Diagnosis present

## 2022-03-24 DIAGNOSIS — G8929 Other chronic pain: Secondary | ICD-10-CM | POA: Diagnosis present

## 2022-03-24 DIAGNOSIS — M25512 Pain in left shoulder: Secondary | ICD-10-CM | POA: Diagnosis present

## 2022-03-24 DIAGNOSIS — G894 Chronic pain syndrome: Secondary | ICD-10-CM | POA: Diagnosis present

## 2022-03-24 DIAGNOSIS — R0781 Pleurodynia: Secondary | ICD-10-CM | POA: Insufficient documentation

## 2022-03-24 DIAGNOSIS — Z79891 Long term (current) use of opiate analgesic: Secondary | ICD-10-CM | POA: Insufficient documentation

## 2022-03-24 DIAGNOSIS — G4486 Cervicogenic headache: Secondary | ICD-10-CM | POA: Insufficient documentation

## 2022-03-24 MED ORDER — TRAMADOL HCL 50 MG PO TABS: 100.0000 mg | ORAL_TABLET | Freq: Four times a day (QID) | ORAL | 5 refills | Status: DC

## 2022-03-24 NOTE — Patient Instructions (Signed)

## 2022-03-24 NOTE — Progress Notes (Signed)
Nursing Pain Medication Assessment:  Safety precautions to be maintained throughout the outpatient stay will include: orient to surroundings, keep bed in low position, maintain call bell within reach at all times, provide assistance with transfer out of bed and ambulation.  Medication Inspection Compliance: Pill count conducted under aseptic conditions, in front of the patient. Neither the pills nor the bottle was removed from the patient's sight at any time. Once count was completed pills were immediately returned to the patient in their original bottle.  Medication: Tramadol (Ultram) Pill/Patch Count:  83 of 240 pills remain Pill/Patch Appearance: Markings consistent with prescribed medication Bottle Appearance: Standard pharmacy container. Clearly labeled. Filled Date: 7 / 23 / 2023 Last Medication intake:  TodaySafety precautions to be maintained throughout the outpatient stay will include: orient to surroundings, keep bed in low position, maintain call bell within reach at all times, provide assistance with transfer out of bed and ambulation.

## 2022-03-28 LAB — TOXASSURE SELECT 13 (MW), URINE
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
## 2022-05-05 ENCOUNTER — Ambulatory Visit: Payer: Medicare Other | Attending: Pain Medicine | Admitting: Pain Medicine

## 2022-05-05 ENCOUNTER — Encounter: Payer: Self-pay | Admitting: Pain Medicine

## 2022-05-05 VITALS — BP 148/87 | HR 57 | Temp 97.2°F | Ht 64.0 in | Wt 236.0 lb

## 2022-05-05 DIAGNOSIS — G8929 Other chronic pain: Secondary | ICD-10-CM | POA: Diagnosis present

## 2022-05-05 DIAGNOSIS — M47816 Spondylosis without myelopathy or radiculopathy, lumbar region: Secondary | ICD-10-CM | POA: Diagnosis present

## 2022-05-05 DIAGNOSIS — M545 Low back pain, unspecified: Secondary | ICD-10-CM | POA: Insufficient documentation

## 2022-05-05 DIAGNOSIS — M4310 Spondylolisthesis, site unspecified: Secondary | ICD-10-CM | POA: Diagnosis present

## 2022-05-05 DIAGNOSIS — M961 Postlaminectomy syndrome, not elsewhere classified: Secondary | ICD-10-CM | POA: Insufficient documentation

## 2022-05-05 NOTE — Patient Instructions (Signed)
______________________________________________________________________  Preparing for Procedure with Sedation  NOTICE: Due to recent regulatory changes, starting on March 18, 2021, procedures requiring intravenous (IV) sedation will no longer be performed at the [facility].  These types of procedures are required to be performed at [facility] ambulatory surgery facility.  We are very sorry for the inconvenience.  Procedure appointments are limited to planned procedures: No Prescription Refills. No disability issues will be discussed. No medication changes will be discussed.  Instructions: Oral Intake: Do not eat or drink anything for at least 8 hours prior to your procedure. (Exception: Blood Pressure Medication. See below.) Transportation: A driver is required. You may not drive yourself after the procedure. Blood Pressure Medicine: Do not forget to take your blood pressure medicine with a sip of water the morning of the procedure. If your Diastolic (lower reading) is above 100 mmHg, elective cases will be cancelled/rescheduled. Blood thinners: These will need to be stopped for procedures. Notify our staff if you are taking any blood thinners. Depending on which one you take, there will be specific instructions on how and when to stop it. Diabetics on insulin: Notify the staff so that you can be scheduled 1st case in the morning. If your diabetes requires high dose insulin, take only  of your normal insulin dose the morning of the procedure and notify the staff that you have done so. Preventing infections: Shower with an antibacterial soap the morning of your procedure. Build-up your immune system: Take 1000 mg of Vitamin C with every meal (3 times a day) the day prior to your procedure. Antibiotics: Inform the staff if you have a condition or reason that requires you to take antibiotics before dental procedures. Pregnancy: If you are pregnant, call and cancel the procedure. Sickness: If  you have a cold, fever, or any active infections, call and cancel the procedure. Arrival: You must be in the facility at least 30 minutes prior to your scheduled procedure. Children: Do not bring children with you. Dress appropriately: There is always the possibility that your clothing may get soiled. Valuables: Do not bring any jewelry or valuables.  Reasons to call and reschedule or cancel your procedure: (Following these recommendations will minimize the risk of a serious complication.) Surgeries: Avoid having procedures within 2 weeks of any surgery. (Avoid for 2 weeks before or after any surgery). Flu Shots: Avoid having procedures within 2 weeks of a flu shots. (Avoid for 2 weeks before or after immunizations). Barium: Avoid having a procedure within 7-10 days after having had a radiological study involving the use of radiological contrast. (Myelograms, Barium swallow or enema study). Heart attacks: Avoid any elective procedures or surgeries for the initial 6 months after a "Myocardial Infarction" (Heart Attack). Blood thinners: It is imperative that you stop these medications before procedures. Let us know if you if you take any blood thinner.  Infection: Avoid procedures during or within two weeks of an infection (including chest colds or gastrointestinal problems). Symptoms associated with infections include: Localized redness, fever, chills, night sweats or profuse sweating, burning sensation when voiding, cough, congestion, stuffiness, runny nose, sore throat, diarrhea, nausea, vomiting, cold or Flu symptoms, recent or current infections. It is specially important if the infection is over the area that we intend to treat. Heart and lung problems: Symptoms that may suggest an active cardiopulmonary problem include: cough, chest pain, breathing difficulties or shortness of breath, dizziness, ankle swelling, uncontrolled high or unusually low blood pressure, and/or palpitations. If you are    experiencing any of these symptoms, cancel your procedure and contact your primary care physician for an evaluation.  Remember:  Regular Business hours are:  Monday to Thursday 8:00 AM to 4:00 PM  Provider's Schedule: Gwenlyn Hottinger, MD:  Procedure days: Tuesday and Thursday 7:30 AM to 4:00 PM  Bilal Lateef, MD:  Procedure days: Monday and Wednesday 7:30 AM to 4:00 PM ______________________________________________________________________  ____________________________________________________________________________________________  General Risks and Possible Complications  Patient Responsibilities: It is important that you read this as it is part of your informed consent. It is our duty to inform you of the risks and possible complications associated with treatments offered to you. It is your responsibility as a patient to read this and to ask questions about anything that is not clear or that you believe was not covered in this document.  Patient's Rights: You have the right to refuse treatment. You also have the right to change your mind, even after initially having agreed to have the treatment done. However, under this last option, if you wait until the last second to change your mind, you may be charged for the materials used up to that point.  Introduction: Medicine is not an exact science. Everything in Medicine, including the lack of treatment(s), carries the potential for danger, harm, or loss (which is by definition: Risk). In Medicine, a complication is a secondary problem, condition, or disease that can aggravate an already existing one. All treatments carry the risk of possible complications. The fact that a side effects or complications occurs, does not imply that the treatment was conducted incorrectly. It must be clearly understood that these can happen even when everything is done following the highest safety standards.  No treatment: You can choose not to proceed with the  proposed treatment alternative. The "PRO(s)" would include: avoiding the risk of complications associated with the therapy. The "CON(s)" would include: not getting any of the treatment benefits. These benefits fall under one of three categories: diagnostic; therapeutic; and/or palliative. Diagnostic benefits include: getting information which can ultimately lead to improvement of the disease or symptom(s). Therapeutic benefits are those associated with the successful treatment of the disease. Finally, palliative benefits are those related to the decrease of the primary symptoms, without necessarily curing the condition (example: decreasing the pain from a flare-up of a chronic condition, such as incurable terminal cancer).  General Risks and Complications: These are associated to most interventional treatments. They can occur alone, or in combination. They fall under one of the following six (6) categories: no benefit or worsening of symptoms; bleeding; infection; nerve damage; allergic reactions; and/or death. No benefits or worsening of symptoms: In Medicine there are no guarantees, only probabilities. No healthcare provider can ever guarantee that a medical treatment will work, they can only state the probability that it may. Furthermore, there is always the possibility that the condition may worsen, either directly, or indirectly, as a consequence of the treatment. Bleeding: This is more common if the patient is taking a blood thinner, either prescription or over the counter (example: Goody Powders, Fish oil, Aspirin, Garlic, etc.), or if suffering a condition associated with impaired coagulation (example: Hemophilia, cirrhosis of the liver, low platelet counts, etc.). However, even if you do not have one on these, it can still happen. If you have any of these conditions, or take one of these drugs, make sure to notify your treating physician. Infection: This is more common in patients with a compromised  immune system, either due to disease (example:   diabetes, cancer, human immunodeficiency virus [HIV], etc.), or due to medications or treatments (example: therapies used to treat cancer and rheumatological diseases). However, even if you do not have one on these, it can still happen. If you have any of these conditions, or take one of these drugs, make sure to notify your treating physician. Nerve Damage: This is more common when the treatment is an invasive one, but it can also happen with the use of medications, such as those used in the treatment of cancer. The damage can occur to small secondary nerves, or to large primary ones, such as those in the spinal cord and brain. This damage may be temporary or permanent and it may lead to impairments that can range from temporary numbness to permanent paralysis and/or brain death. Allergic Reactions: Any time a substance or material comes in contact with our body, there is the possibility of an allergic reaction. These can range from a mild skin rash (contact dermatitis) to a severe systemic reaction (anaphylactic reaction), which can result in death. Death: In general, any medical intervention can result in death, most of the time due to an unforeseen complication. ____________________________________________________________________________________________   Companies normally used in [place] area. [employer]   Spinal Cord Stimulation Trial Information A spinal cord stimulation trial is a test to see whether a spinal cord stimulator reduces your pain. A spinal cord stimulator is a small device that is inserted (implanted) in your back. The stimulator has small wires (leads) that connect it to your spinal cord. The stimulator sends electrical pulses through the leads to the spinal cord. This can relieve pain. Settings for the stimulator can be adjusted with a remote device to find the best pain control. Your health care provider may  suggest a spinal cord stimulation trial if other treatments for long-lasting (chronic) pain have not worked for you. Spinal cord stimulation may be used to manage pain that is caused by: Failed back surgery. Heart pain (angina) or a blood vessel problem (peripheral vascular disease). Pain after amputation (phantom limb sensation). Nerve-related pain. Complex regional pain syndrome. Painful inflammation of a thin membrane that covers the brain and spinal cord (arachnoiditis). Other syndromes that involve chronic pain or injuries related to the spinal cord. For the trial, the stimulator is attached to your back instead of inserted under the skin. A trial period is usually 3-5 days, but this can vary among health care providers. After your trial period, you and your health care provider will discuss whether a permanent spinal cord stimulator is an option for you. The permanent stimulator may be an option depending on: Whether the stimulator reduces your pain during the trial. Whether the stimulator fits into your lifestyle. Whether the cost of the stimulator is covered by your insurance. What are the risks? Generally, a spinal cord stimulation trial is safe. However, problems can occur, including: Bleeding or pain at the insertion site of the leads. Infection at the insertion site or around the leads. Allergic reactions to medicines, devices, or dyes. Damage to the skin, nerves, back muscles, or spinal cord where the leads are placed. Inability to move the legs (paralysis). Numbness in the legs. Inability to control when you urinate or have a bowel movement (incontinence). Spinal fluid leakage. How is a spinal cord stimulator placed for a trial?  For a trial period, the stimulator generator and battery will be outside the body, typically on a belt that you will wear around your waist. Only the leads that  connect the stimulator to the spinal cord are implanted under your skin. The exact location  of the stimulator depends on where you have pain. There are two types of surgery for implanting the leads: Noninvasive surgery. In this type of surgery, a small incision is made and needles are used to place the leads under your skin. Open surgery. In this type of surgery, a larger incision is made, and the leads are implanted directly into your back. How to care for yourself during the trial period Incision care Check your incisions every day for signs of infection. Check for: Redness, swelling, or pain. Fluid or blood. Warmth. Pus or a bad smell. Activity Return to your normal activities as told by your health care provider. Ask your health care provider what activities are safe for you. You may have to avoid lifting. Ask your health care provider how much you can safely lift. General instructions Follow your health care provider's instructions about how to take care of your spinal cord stimulator and your incision. Make sure you write down the following information so that you can share this information with your health care provider: Your responses to the stimulator. Describe these as told by your health care provider. Your pain level throughout the day. The amount and kind of pain medicine that you take. Do not take baths, swim, or use a hot tub until your health care provider approves. Ask your health care provider if you may take showers. You may only be allowed to take sponge baths. Take over-the-counter and prescription medicines only as told by your health care provider. Tell all health care providers who provide care for you that you have a spinal cord stimulator. This is important information that could affect the medical treatment that you receive. Keep all follow-up visits. This is important. Contact a health care provider if: You have any of these signs of infection: Redness, swelling, or pain around your incision. Fluid or blood coming from your incision. Warmth coming  from your incision. Pus or a bad smell coming from your incision. A fever. Get help right away if: Your pain gets worse. The stimulator leads come out. You develop numbness or weakness in your legs, or you have difficulty walking. You have problems urinating or having a bowel movement. You have symptoms that last for more than 2-3 days or your symptoms suddenly get worse. These symptoms may be an emergency. Get help right away. Call 911. Do not wait to see if the symptoms will go away. Do not drive yourself to the hospital. Summary A spinal cord stimulator is a small device that sends electrical pulses to your spinal cord. This can relieve pain caused by many different health conditions. Before a permanent stimulator is placed, you will have a trial using a temporary stimulator that is not implanted under your skin. This helps determine if a stimulator will reduce your pain. For the trial, only the leads that connect the stimulator to the spinal cord are implanted under your skin. During the trial period, make sure you write down information about your pain and your responses to the stimulator so that you can share this information with your health care provider. Keep all follow-up visits. This is important. Contact your health care provider if you have problems during the trial. This information is not intended to replace advice given to you by your health care provider. Make sure you discuss any questions you have with your health care provider. Document Revised: 05/07/2021 Document Reviewed:  05/07/2021 Elsevier Patient Education  [place].  ______________________________________________________________________________________  Spinal Cord Stimulator Implantation  A spinal cord stimulator is a small device that makes electrical signals and sends them through wires (leads) to nerves in the spinal cord. This stimulates the nerves and can help relieve long-term (chronic) pain in  the back, legs, or arms. Implantation is a procedure to place this device under the skin. This procedure is done if a temporary spinal cord stimulator effectively reduces your pain during a trial period. A spinal cord stimulator may have an electrical pulse generator, a battery, and leads. It may also come with a small remote that you can control. Stimulators with a rechargeable battery may last up to 10 years. Stimulators with a non-rechargeable battery may last 2-5 years before being replaced. Tell a health care provider about: Any allergies you have. All medicines you are taking, including vitamins, herbs, eye drops, creams, and over-the-counter medicines. Any problems you or family members have had with anesthetic medicines. Any bleeding problems you have. Any surgeries you have had. Any medical conditions you have. Whether you are pregnant or may be pregnant. What are the risks? Generally, this is a safe procedure. However, problems may occur, including: Infection. Bleeding. Allergic reactions to medicines, devices, or dyes. Damage to nerves, back muscles, spinal cord, numbness, or the inability to move (paralysis). Spinal fluid leakage or a pocket of clear fluid forming under the skin (seroma). Inability to control when you urinate or have a bowel movement (incontinence). Other problems that may occur specific to the spinal cord stimulator include: Damage to the skin where the battery is placed. A lead moving out of place. The device or battery failing or not working. What happens before the procedure? When to stop eating and drinking Follow instructions from your health care provider about what you may eat and drink before your procedure. These may include: 8 hours before your procedure Stop eating most foods. Do not eat meat, fried foods, or fatty foods. Eat only light foods, such as toast or crackers. All liquids are okay except energy drinks and alcohol. 6 hours before your  procedure Stop eating. Drink only clear liquids, such as water, clear fruit juice, black coffee, plain tea, and sports drinks. Do not drink energy drinks or alcohol. 2 hours before your procedure Stop drinking all liquids. You may be allowed to take medicines with small sips of water. If you do not follow your health care provider's instructions, your procedure may be delayed or canceled. Medicines Ask your health care provider about: Changing or stopping your regular medicines. This is especially important if you are taking diabetes medicines or blood thinners. Taking medicines such as aspirin and ibuprofen. These medicines can thin your blood. Do not take these medicines unless your health care provider tells you to take them. Taking over-the-counter medicines, vitamins, herbs, and supplements. Tests You will have an electrocardiogram (ECG) to check the electrical patterns and rhythms of your heart. You may have: A blood or urine sample taken. A physical exam. Chest X-rays. General instructions Do not use any products that contain nicotine or tobacco for at least 4 weeks before the procedure. These products include cigarettes, chewing tobacco, and vaping devices, such as e-cigarettes. If you need help quitting, ask your health care provider. If you will be going home right after the procedure, plan to have a responsible adult: Take you home from the hospital or clinic. You will not be allowed to drive. Care for you for the  time you are told. Ask your health care provider: How your surgery site will be marked. What steps will be taken to help prevent infection. These steps may include: Removing hair at the surgery site. Washing skin with a germ-killing soap. Taking antibiotic medicine. What happens during the procedure? An IV will be inserted into one of your veins. You may be given: A medicine to help you relax (sedative). A medicine to numb the area (local anesthetic). Dye may  be injected into your spinal canal (epidural space) to make it easier to see on X-rays. X-rays may be done during the procedure to help implant the stimulator. A small incision will be made in your back. Leads will be placed near your spinal cord. X-rays will be done to make sure the leads are in the right place. The leads will be tested with stimulation, and you will be asked to react to the tests. The leads will all be joined to one lead that connects to the pulse generator (lead wire). The lead wire will be placed under your skin so that it leads from your spine to your abdomen or buttocks. A small incision will be made in your abdomen or buttocks. The pulse generator will be placed in either your abdomen or buttocks and connected to the lead wire. A small pocket of skin will be formed around it. Your incisions will be closed with stitches (sutures). Your incisions will be covered with bandages (dressings). The procedure may vary among health care providers and hospitals. What happens after the procedure? Your blood pressure, heart rate, breathing rate, and blood oxygen level will be monitored until you leave the hospital or clinic. You will be given pain medicine as needed. Your spinal cord stimulator may be programmed. You may have to stay in the hospital after the procedure. Ask your health care provider how long you may stay. Summary A spinal cord stimulator sends electrical pulses through the leads to the spinal cord. This can relieve pain. A spinal cord stimulator may have an electrical pulse generator, a battery, and a small remote that you can control. If you will be going home right after the procedure, plan to have a responsible adult care for you for the time you are told. This information is not intended to replace advice given to you by your health care provider. Make sure you discuss any questions you have with your health care provider. Document Revised: 05/07/2021 Document  Reviewed: 05/07/2021 Elsevier Patient Education  [place].

## 2022-05-05 NOTE — Progress Notes (Signed)
Safety precautions to be maintained throughout the outpatient stay will include: orient to surroundings, keep bed in low position, maintain call bell within reach at all times, provide assistance with transfer out of bed and ambulation.  

## 2022-05-13 ENCOUNTER — Ambulatory Visit
Admission: RE | Admit: 2022-05-13 | Discharge: 2022-05-13 | Disposition: A | Discharge status: Home / Self Care | Payer: Medicare Other | Source: Ambulatory Visit | Attending: Pain Medicine | Admitting: Pain Medicine

## 2022-05-13 ENCOUNTER — Ambulatory Visit: Payer: Medicare Other | Attending: Pain Medicine | Admitting: Pain Medicine

## 2022-05-13 ENCOUNTER — Encounter: Payer: Self-pay | Admitting: Pain Medicine

## 2022-05-13 VITALS — BP 142/78 | HR 71 | Temp 97.3°F | Resp 15 | Ht 64.0 in | Wt 236.0 lb

## 2022-05-13 DIAGNOSIS — M545 Low back pain, unspecified: Secondary | ICD-10-CM | POA: Diagnosis present

## 2022-05-13 DIAGNOSIS — M47816 Spondylosis without myelopathy or radiculopathy, lumbar region: Secondary | ICD-10-CM | POA: Diagnosis present

## 2022-05-13 DIAGNOSIS — M961 Postlaminectomy syndrome, not elsewhere classified: Secondary | ICD-10-CM | POA: Diagnosis present

## 2022-05-13 DIAGNOSIS — M47817 Spondylosis without myelopathy or radiculopathy, lumbosacral region: Secondary | ICD-10-CM | POA: Insufficient documentation

## 2022-05-13 DIAGNOSIS — M5136 Other intervertebral disc degeneration, lumbar region: Secondary | ICD-10-CM | POA: Diagnosis present

## 2022-05-13 DIAGNOSIS — G8929 Other chronic pain: Secondary | ICD-10-CM | POA: Diagnosis present

## 2022-05-13 DIAGNOSIS — M4310 Spondylolisthesis, site unspecified: Secondary | ICD-10-CM | POA: Diagnosis present

## 2022-05-13 MED ORDER — LIDOCAINE HCL 2 % IJ SOLN
20.0000 mL | Freq: Once | INTRAMUSCULAR | Status: AC
  Administered 2022-05-13: 400 mg

## 2022-05-13 MED ORDER — FENTANYL CITRATE (PF) 100 MCG/2ML IJ SOLN
25.0000 ug | INTRAMUSCULAR | Status: DC | PRN
  Administered 2022-05-13: 50 ug via INTRAVENOUS

## 2022-05-13 MED ORDER — MIDAZOLAM HCL 5 MG/5ML IJ SOLN
INTRAMUSCULAR | Status: AC
  Filled 2022-05-13: qty 5

## 2022-05-13 MED ORDER — LIDOCAINE HCL 2 % IJ SOLN
INTRAMUSCULAR | Status: AC
  Filled 2022-05-13: qty 20

## 2022-05-13 MED ORDER — LACTATED RINGERS IV SOLN: Freq: Once | INTRAVENOUS | Status: AC

## 2022-05-13 MED ORDER — MIDAZOLAM HCL 5 MG/5ML IJ SOLN
0.5000 mg | Freq: Once | INTRAMUSCULAR | Status: AC
  Administered 2022-05-13: 1 mg via INTRAVENOUS

## 2022-05-13 MED ORDER — FENTANYL CITRATE (PF) 100 MCG/2ML IJ SOLN
INTRAMUSCULAR | Status: AC
  Filled 2022-05-13: qty 2

## 2022-05-13 MED ORDER — ROPIVACAINE HCL 2 MG/ML IJ SOLN
INTRAMUSCULAR | Status: AC
  Filled 2022-05-13: qty 20

## 2022-05-13 MED ORDER — TRIAMCINOLONE ACETONIDE 40 MG/ML IJ SUSP
80.0000 mg | Freq: Once | INTRAMUSCULAR | Status: AC
  Administered 2022-05-13: 80 mg

## 2022-05-13 MED ORDER — PENTAFLUOROPROP-TETRAFLUOROETH EX AERO
INHALATION_SPRAY | Freq: Once | CUTANEOUS | Status: DC
  Filled 2022-05-13: qty 116

## 2022-05-13 MED ORDER — TRIAMCINOLONE ACETONIDE 40 MG/ML IJ SUSP
INTRAMUSCULAR | Status: AC
  Filled 2022-05-13: qty 2

## 2022-05-13 MED ORDER — ROPIVACAINE HCL 2 MG/ML IJ SOLN
18.0000 mL | Freq: Once | INTRAMUSCULAR | Status: AC
  Administered 2022-05-13: 18 mL via PERINEURAL

## 2022-05-13 NOTE — Progress Notes (Signed)
Safety precautions to be maintained throughout the outpatient stay will include: orient to surroundings, keep bed in low position, maintain call bell within reach at all times, provide assistance with transfer out of bed and ambulation.  

## 2022-05-13 NOTE — Progress Notes (Signed)
PROVIDER NOTE: Interpretation of information contained herein should be left to medically-trained personnel. Specific patient instructions are provided elsewhere under "Patient Instructions" section of medical record. This document was created in part using STT-dictation technology, any transcriptional errors that may result from this process are unintentional.  Patient: Regina Campos Type: Established DOB: 23-Feb-1951 MRN: [id number] PCP: Regina Simmer, MD  Service: Procedure DOS: 05/13/2022 Setting: Ambulatory Location: Ambulatory outpatient facility Delivery: Face-to-face Provider: Gaspar Cola, MD Specialty: Interventional Pain Management Specialty designation: 09 Location: Outpatient facility Ref. Prov.: Campos, Regina Sledge, MD    Procedure:           Type: Lumbar Facet, Medial Branch Block(s) #2  Laterality: Bilateral  Level: L2, L3, L4, L5, & S1 Medial Branch Level(s). Injecting these levels blocks the L3-4, L4-5 and L5-S1 lumbar facet joints. Imaging: Fluoroscopic guidance         Anesthesia: Local anesthesia (1-2% Lidocaine) Anxiolysis: IV Versed 1.0 mg Sedation: Moderate Sedation Fentanyl 1 mL (50 mcg) DOS: 05/13/2022 Performed by: Regina Cola, MD  Primary Purpose: Diagnostic/Therapeutic Indications: Low back pain severe enough to impact quality of life or function. 1. Lumbar facet syndrome (Bilateral)   2. Spondylosis without myelopathy or radiculopathy, lumbosacral region   3. 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 DDD (degenerative disc disease), lumbar   6. Chronic low back pain (Bilateral) w/o sciatica    NAS-11 Pain score:   Pre-procedure: 8 /10   Post-procedure: 0-No pain/10     Position / Prep / Materials:  Position: Prone  Prep solution: DuraPrep (Iodine Povacrylex [0.7% available iodine] and Isopropyl Alcohol, 74% w/w) Area Prepped: Posterolateral Lumbosacral  Spine (Wide prep: From the lower border of the scapula down to the end of the tailbone and from flank to flank.)  Materials:  Tray: Block Needle(s):  Type: Spinal  Gauge (G): 22  Length: 5-in Qty: 4     Pre-op H&P Assessment:  Regina Campos a 71y.o. (year old), female patient, seen today for interventional treatment. She  has a past surgical history that includes Abdominal hysterectomy; Total shoulder replacement; Replacement total knee; Appendectomy; Lumbar fusion; Carpal tunnel release; Back surgery (07/25/2016); Cataract extraction w/PHACO (Right, 10/13/2019); Cataract extraction w/PHACO (Left, 11/24/2019); Total thyroidectomy; Colonoscopy (N/A, 10/21/2021); Esophagogastroduodenoscopy (egd) with propofol (N/A, 10/21/2021); and Colonoscopy with propofol (N/A, 12/17/2021). Regina Campos a current medication list which includes the following prescription(s): albuterol, vitamin d3, fenofibrate, furosemide, ipratropium-albuterol, levothyroxine, loratadine, lubiprostone, naloxone, nitroglycerin, NON FORMULARY, nortriptyline, oxygen-helium, potassium chloride, rosuvastatin, tiotropium, tramadol, and ubrogepant, and the following Facility-Administered Medications: fentanyl and pentafluoroprop-tetrafluoroeth. Her primarily concern today is the Back Pain (Both side)  Initial Vital Signs:  Pulse/HCG Rate: 71ECG Heart Rate: 73 (NSR) Temp: (!) 97.3 F (36.3 C) Resp: 18 BP: (!) 126/93 SpO2: 96 %  BMI: Estimated body mass index is 40.51 kg/m as calculated from the following:   Height as of this encounter: '5\' 4"'$  (1.626 m).   Weight as of this encounter: 236 lb (107 kg).  Risk Assessment: Allergies: Reviewed. She has No Known Allergies.  Allergy Precautions: None required Coagulopathies: Reviewed. None identified.  Blood-thinner therapy: None at this time Active Infection(s): Reviewed. None identified. Regina Campos afebrile  Site Confirmation: Regina Campos asked to confirm the procedure and  laterality before marking the site Procedure checklist: Completed Consent: Before the procedure and under the influence of no sedative(s), amnesic(s), or anxiolytics, the patient was  informed of the treatment options, risks and possible complications. To fulfill our ethical and legal obligations, as recommended by the American Medical Association's Code of Ethics, I have informed the patient of my clinical impression; the nature and purpose of the treatment or procedure; the risks, benefits, and possible complications of the intervention; the alternatives, including doing nothing; the risk(s) and benefit(s) of the alternative treatment(s) or procedure(s); and the risk(s) and benefit(s) of doing nothing. The patient was provided information about the general risks and possible complications associated with the procedure. These may include, but are not limited to: failure to achieve desired goals, infection, bleeding, organ or nerve damage, allergic reactions, paralysis, and death. In addition, the patient was informed of those risks and complications associated to Spine-related procedures, such as failure to decrease pain; infection (i.e.: Meningitis, epidural or intraspinal abscess); bleeding (i.e.: epidural hematoma, subarachnoid hemorrhage, or any other type of intraspinal or peri-dural bleeding); organ or nerve damage (i.e.: Any type of peripheral nerve, nerve root, or spinal cord injury) with subsequent damage to sensory, motor, and/or autonomic systems, resulting in permanent pain, numbness, and/or weakness of one or several areas of the body; allergic reactions; (i.e.: anaphylactic reaction); and/or death. Furthermore, the patient was informed of those risks and complications associated with the medications. These include, but are not limited to: allergic reactions (i.e.: anaphylactic or anaphylactoid reaction(s)); adrenal axis suppression; blood sugar elevation that in diabetics may result in  ketoacidosis or comma; water retention that in patients with history of congestive heart failure may result in shortness of breath, pulmonary edema, and decompensation with resultant heart failure; weight gain; swelling or edema; medication-induced neural toxicity; particulate matter embolism and blood vessel occlusion with resultant organ, and/or nervous system infarction; and/or aseptic necrosis of one or more joints. Finally, the patient was informed that Medicine is not an exact science; therefore, there is also the possibility of unforeseen or unpredictable risks and/or possible complications that may result in a catastrophic outcome. The patient indicated having understood very clearly. We have given the patient no guarantees and we have made no promises. Enough time was given to the patient to ask questions, all of which were answered to the patient's satisfaction. Ms. Rispoli has indicated that she wanted to continue with the procedure. Attestation: I, the ordering provider, attest that I have discussed with the patient the benefits, risks, side-effects, alternatives, likelihood of achieving goals, and potential problems during recovery for the procedure that I have provided informed consent. Date  Time: 05/13/2022  8:33 AM  Pre-Procedure Preparation:  Monitoring: As per clinic protocol. Respiration, ETCO2, SpO2, BP, heart rate and rhythm monitor placed and checked for adequate function Safety Precautions: Patient was assessed for positional comfort and pressure points before starting the procedure. Time-out: I initiated and conducted the "Time-out" before starting the procedure, as per protocol. The patient was asked to participate by confirming the accuracy of the "Time Out" information. Verification of the correct person, site, and procedure were performed and confirmed by me, the nursing staff, and the patient. "Time-out" conducted as per Joint Commission's Universal Protocol  (UP.01.01.01). Time: 0945  Description of Procedure:          Laterality: Bilateral. The procedure was performed in identical fashion on both sides. Targeted Levels:  L2, L3, L4, L5, & S1 Medial Branch Level(s)  Safety Precautions: Aspiration looking for blood return was conducted prior to all injections. At no point did we inject any substances, as a needle was being advanced. Before injecting, the  patient was told to immediately notify me if she was experiencing any new onset of "ringing in the ears, or metallic taste in the mouth". No attempts were made at seeking any paresthesias. Safe injection practices and needle disposal techniques used. Medications properly checked for expiration dates. SDV (single dose vial) medications used. After the completion of the procedure, all disposable equipment used was discarded in the proper designated medical waste containers. Local Anesthesia: Protocol guidelines were followed. The patient was positioned over the fluoroscopy table. The area was prepped in the usual manner. The time-out was completed. The target area was identified using fluoroscopy. A 12-in long, straight, sterile hemostat was used with fluoroscopic guidance to locate the targets for each level blocked. Once located, the skin was marked with an approved surgical skin marker. Once all sites were marked, the skin (epidermis, dermis, and hypodermis), as well as deeper tissues (fat, connective tissue and muscle) were infiltrated with a small amount of a short-acting local anesthetic, loaded on a 10cc syringe with a 25G, 1.5-in  Needle. An appropriate amount of time was allowed for local anesthetics to take effect before proceeding to the next step. Local Anesthetic: Lidocaine 2.0% The unused portion of the local anesthetic was discarded in the proper designated containers. Technical description of process:  L2 Medial Branch Nerve Block (MBB): The target area for the L2 medial branch is at the junction  of the postero-lateral aspect of the superior articular process and the superior, posterior, and medial edge of the transverse process of L3. Under fluoroscopic guidance, a Quincke needle was inserted until contact was made with os over the superior postero-lateral aspect of the pedicular shadow (target area). After negative aspiration for blood, 0.5 mL of the nerve block solution was injected without difficulty or complication. The needle was removed intact. L3 Medial Branch Nerve Block (MBB): The target area for the L3 medial branch is at the junction of the postero-lateral aspect of the superior articular process and the superior, posterior, and medial edge of the transverse process of L4. Under fluoroscopic guidance, a Quincke needle was inserted until contact was made with os over the superior postero-lateral aspect of the pedicular shadow (target area). After negative aspiration for blood, 0.5 mL of the nerve block solution was injected without difficulty or complication. The needle was removed intact. L4 Medial Branch Nerve Block (MBB): The target area for the L4 medial branch is at the junction of the postero-lateral aspect of the superior articular process and the superior, posterior, and medial edge of the transverse process of L5. Under fluoroscopic guidance, a Quincke needle was inserted until contact was made with os over the superior postero-lateral aspect of the pedicular shadow (target area). After negative aspiration for blood, 0.5 mL of the nerve block solution was injected without difficulty or complication. The needle was removed intact. L5 Medial Branch Nerve Block (MBB): The target area for the L5 medial branch is at the junction of the postero-lateral aspect of the superior articular process and the superior, posterior, and medial edge of the sacral ala. Under fluoroscopic guidance, a Quincke needle was inserted until contact was made with os over the superior postero-lateral aspect of the  pedicular shadow (target area). After negative aspiration for blood, 0.5 mL of the nerve block solution was injected without difficulty or complication. The needle was removed intact. S1 Medial Branch Nerve Block (MBB): The target area for the S1 medial branch is at the posterior and inferior 6 o'clock position of the L5-S1 facet  joint. Under fluoroscopic guidance, the Quincke needle inserted for the L5 MBB was redirected until contact was made with os over the inferior and postero aspect of the sacrum, at the 6 o' clock position under the L5-S1 facet joint (Target area). After negative aspiration for blood, 0.5 mL of the nerve block solution was injected without difficulty or complication. The needle was removed intact.  Once the entire procedure was completed, the treated area was cleaned, making sure to leave some of the prepping solution back to take advantage of its long term bactericidal properties.         Illustration of the posterior view of the lumbar spine and the posterior neural structures. Laminae of L2 through S1 are labeled. DPRL5, dorsal primary ramus of L5; DPRS1, dorsal primary ramus of S1; DPR3, dorsal primary ramus of L3; FJ, facet (zygapophyseal) joint L3-L4; I, inferior articular process of L4; LB1, lateral branch of dorsal primary ramus of L1; IAB, inferior articular branches from L3 medial branch (supplies L4-L5 facet joint); IBP, intermediate branch plexus; MB3, medial branch of dorsal primary ramus of L3; NR3, third lumbar nerve root; S, superior articular process of L5; SAB, superior articular branches from L4 (supplies L4-5 facet joint also); TP3, transverse process of L3.  Vitals:   05/13/22 1000 05/13/22 1006 05/13/22 1016 05/13/22 1024  BP: (!) 151/87 (!) 150/73 (!) 143/85 (!) 142/78  Pulse:      Resp: '16 16 16 15  '$ Temp:      SpO2: 100% 95% 96% 95%  Weight:      Height:         Start Time: 0945 hrs. End Time: 1000 hrs.  Imaging Guidance (Spinal):           Type of Imaging Technique: Fluoroscopy Guidance (Spinal) Indication(s): Assistance in needle guidance and placement for procedures requiring needle placement in or near specific anatomical locations not easily accessible without such assistance. Exposure Time: Please see nurses notes. Contrast: None used. Fluoroscopic Guidance: I was personally present during the use of fluoroscopy. "Tunnel Vision Technique" used to obtain the best possible view of the target area. Parallax error corrected before commencing the procedure. "Direction-depth-direction" technique used to introduce the needle under continuous pulsed fluoroscopy. Once target was reached, antero-posterior, oblique, and lateral fluoroscopic projection used confirm needle placement in all planes. Images permanently stored in EMR. Interpretation: No contrast injected. I personally interpreted the imaging intraoperatively. Adequate needle placement confirmed in multiple planes. Permanent images saved into the patient's record.  Antibiotic Prophylaxis:   Anti-infectives (From admission, onward)    None      Indication(s): None identified  Post-operative Assessment:  Post-procedure Vital Signs:  Pulse/HCG Rate: 7165 Temp: (!) 97.3 F (36.3 C) Resp: 15 BP: (!) 142/78 SpO2: 95 %  EBL: None  Complications: No immediate post-treatment complications observed by team, or reported by patient.  Note: The patient tolerated the entire procedure well. A repeat set of vitals were taken after the procedure and the patient was kept under observation following institutional policy, for this type of procedure. Post-procedural neurological assessment was performed, showing return to baseline, prior to discharge. The patient was provided with post-procedure discharge instructions, including a section on how to identify potential problems. Should any problems arise concerning this procedure, the patient was given instructions to immediately contact  us, at any time, without hesitation. In any case, we plan to contact the patient by telephone for a follow-up status report regarding this interventional procedure.  Comments:  No additional  relevant information.  Plan of Care  Orders:  Orders Placed This Encounter  Procedures   LUMBAR FACET(MEDIAL BRANCH NERVE BLOCK) MBNB    Scheduling Instructions:     Procedure: Lumbar facet block (AKA.: Lumbosacral medial branch nerve block)     Side: Bilateral     Level: L3-4 & L5-S1 Facets (L2, L3, L4, L5, & S1 Medial Branch Nerves)     Sedation: Patient's choice.     Timeframe: Today    Order Specific Question:   Where will this procedure be performed?    Answer:   [facility]   DG [facility] C-ARM 1-60 MIN NO REPORT    Intraoperative interpretation by procedural physician at [place].    Standing Status:   Standing    Number of Occurrences:   1    Order Specific Question:   Reason for exam:    Answer:   Assistance in needle guidance and placement for procedures requiring needle placement in or near specific anatomical locations not easily accessible without such assistance.   Ambulatory referral to Psychology    Referral Priority:   Routine    Referral Type:   Psychiatric    Referral Reason:   Specialty Services Required    Requested Specialty:   Psychology    Number of Visits Requested:   1   Informed Consent Details: Physician/Practitioner Attestation; Transcribe to consent form and obtain patient signature    Nursing Order: Transcribe to consent form and obtain patient signature. Note: Always confirm laterality of pain with Ms. Broadus, before procedure.    Order Specific Question:   Physician/Practitioner attestation of informed consent for procedure/surgical case    Answer:   I, the physician/practitioner, attest that I have discussed with the patient the benefits, risks, side effects, alternatives, likelihood of achieving goals and potential problems during  recovery for the procedure that I have provided informed consent.    Order Specific Question:   Procedure    Answer:   Lumbar Facet Block  under fluoroscopic guidance    Order Specific Question:   Physician/Practitioner performing the procedure    Answer:   Arden Axon A. Dossie Arbour MD    Order Specific Question:   Indication/Reason    Answer:   Low Back Pain, with our without leg pain, due to Facet Joint Arthralgia (Joint Pain) Spondylosis (Arthritis of the Spine), without myelopathy or radiculopathy (Nerve Damage).   Provide equipment / supplies at bedside    "Block Tray" (Disposable  single use) Needle type: SpinalSpinal Amount/quantity: 4 Size: Medium (5-inch) Gauge: 22G    Standing Status:   Standing    Number of Occurrences:   1    Order Specific Question:   Specify    Answer:   Block Tray   Chronic Opioid Analgesic:  Tramadol 50 mg, 2 tabs PO q 6 hrs (400 mg/day of tramadol) MME/day: 40 mg/day.   Medications ordered for procedure: Meds ordered this encounter  Medications   lidocaine (XYLOCAINE) 2 % (with pres) injection 400 mg   pentafluoroprop-tetrafluoroeth (GEBAUERS) aerosol   lactated ringers infusion   midazolam (VERSED) 5 MG/5ML injection 0.5-2 mg    Make sure Flumazenil is available in the pyxis when using this medication. If oversedation occurs, administer 0.2 mg IV over 15 sec. If after 45 sec no response, administer 0.2 mg again over 1 min; may repeat at 1 min intervals; not to exceed 4 doses (1 mg)   fentaNYL (SUBLIMAZE) injection 25-50 mcg    Make  sure Narcan is available in the pyxis when using this medication. In the event of respiratory depression (RR< 8/min): Titrate NARCAN (naloxone) in increments of 0.1 to 0.2 mg IV at 2-3 minute intervals, until desired degree of reversal.   ropivacaine (PF) 2 mg/mL (0.2%) (NAROPIN) injection 18 mL   triamcinolone acetonide (KENALOG-40) injection 80 mg   Medications administered: We administered lidocaine, lactated ringers,  midazolam, fentaNYL, ropivacaine (PF) 2 mg/mL (0.2%), and triamcinolone acetonide.  See the medical record for exact dosing, route, and time of administration.  Follow-up plan:   Return in about 2 weeks (around 05/27/2022) for Proc-day (T,Th), (VV), (MM).       Interventional Therapies  Risk  Complexity Considerations:   Estimated body mass index is 39.94 kg/m as calculated from the following:   Height as of 04/02/21: '5\' 5"'$  (1.651 m).   Weight as of 04/02/21: 240 lb (108.9 kg).   NO RFA 2ry to Lumbar Hardware  WNL   Planned  Pending:      Under consideration:   Diagnostic bilateral lumbar facet MBB #2  SCS/Pump   Completed:   Diagnostic bilateral lumbar facet MBB x1 (11/14/2021) (100/100/80)  Therapeutic midline/right L2-3 LESI x4 (10/08/2021) (100/100/90/100)  Diagnostic/therapeutic bilateral foot 4th TMT steroid injection x2 (03/01/2020) (100/100/80/>75)  Diagnostic/therapeutic left intercostal NB of ribs 5-8 x1 (02/07/2020) (100/100/75/85)  Diagnostic right dorsal tarsometatarsal joint #4 injection x1 (12/31/2017)  Palliative right L3-4 LESI x2 (09/20/2015)  Palliative right L4 TFESI x2 (09/20/2015)  Palliative right C7-T1 CESI x1 (07/03/2015)  Palliative left IA shoulder joint inj. x1 (05/12/2017)  Palliative right suprascapular NB x1 (11/19/2016)  Palliative left suprascapular NB x2 (06/04/2017)  Palliative bilateral IA Hip injection x1 (10/01/2016)  Therapeutic right ring finger (#4) A-1 pulley, trigger finger sheath inj. x1 (04/07/2017)  Diagnostic bilateral dorsolateral Cuboid and 5th PMC, foot inj. x1 (12/03/2017)    Therapeutic  Palliative (PRN) options:   Palliative right L2-3 LESI #5  Palliative right C7-T1 CESI #2  Therapeutic right finger #4 (ring) A-1 pulley area, trigger finger tendon sheath injection #2  Diagnostic bilateral dorsolateral junction of Cuboid and 5th PMC, foot steroid injection #2     Recent Visits Date Type Provider Dept  05/05/22 Office  Visit Milinda Pointer, MD [facility]  03/24/22 Office Visit Milinda Pointer, MD [facility]  Showing recent visits within past 90 days and meeting all other requirements Today's Visits Date Type Provider Dept  05/13/22 Procedure visit Milinda Pointer, MD [facility]  Showing today's visits and meeting all other requirements Future Appointments Date Type Provider Dept  05/27/22 Appointment Milinda Pointer, MD [facility]  Showing future appointments within next 90 days and meeting all other requirements  Disposition: Discharge home  Discharge (Date  Time): 05/13/2022; 1025 hrs.   Primary Care Physician: Regina Simmer, MD Location: [facility] Note by: Regina Cola, MD Date: 05/13/2022; Time: 10:54 AM  Disclaimer:  Medicine is not an Chief Strategy Officer. The only guarantee in medicine is that nothing is guaranteed. It is important to note that the decision to proceed with this intervention was based on the information collected from the patient. The Data and conclusions were drawn from the patient's questionnaire, the interview, and the physical examination. Because the information was provided in large part by the patient, it cannot be guaranteed that it has not been purposely or unconsciously manipulated. Every effort has been made to obtain as much relevant data as possible for this evaluation.  It is important to note that the conclusions that lead to this procedure are derived in large part from the available data. Always take into account that the treatment will also be dependent on availability of resources and existing treatment guidelines, considered by other Pain Management Practitioners as being common knowledge and practice, at the time of the intervention. For Medico-Legal purposes, it is also important to point out that variation in procedural techniques and pharmacological choices are the  acceptable norm. The indications, contraindications, technique, and results of the above procedure should only be interpreted and judged by a Board-Certified Interventional Pain Specialist with extensive familiarity and expertise in the same exact procedure and technique.

## 2022-05-13 NOTE — Patient Instructions (Signed)
____________________________________________________________________________________________  Virtual Visits   What is a "Virtual Visit"? It is a healthcare communication encounter (medical visit) that takes place on real time (NOT TEXT or E-MAIL) over the telephone or computer device (desktop, laptop, tablet, smart phone, etc.). It allows for more location flexibility between the patient and the healthcare provider.  Who decides when these types of visits will be used? The physician.  Who is eligible for these types of visits? Only those patients that can be reliably reached over the telephone.  What do you mean by reliably? We do not have time to call everyone multiple times, therefore those that tend to screen calls and then call back later are not suitable candidates for this system. We understand how people are reluctant to pickup on "unknown" calls, therefore, we suggest adding our telephone numbers to your list of "CONTACT(s)". This way, you should be able to readily identify our calls when you receive one. All of our numbers are available below.   Who is not eligible? This option is not available for medication management encounters, specially for controlled substances. Patients on pain medications that fall under the category of controlled substances have to come in for "Face-to-Face" encounters. This is required for mandatory monitoring of these substances. You may be asked to provide a sample for an unannounced urine drug screening test (UDS), and we will need to count your pain pills. Not bringing your pills to be counted may result in no refill. Obviously, neither one of these can be done over the phone.  When will this type of visits be used? You can request a virtual visit whenever you are physically unable to attend a regular appointment. The decision will be made by the physician (or healthcare provider) on a case by case basis.   At what time will I be called? This is an  excellent question. The providers will try to call you whenever they have time available. Do not expect to be called at any specific time. The secretaries will assign you a time for your virtual visit appointment, but this is done simply to keep a list of those patients that need to be called, but not for the purpose of keeping a time schedule. Be advised that the call may come in anytime during the day, between the hours of 8:00 AM and 8::00 PM, depending on provider availability. We do understand that the system is not perfect. If you are unable to be available that day on a moments notice, then request an "in-person" appointment rather than a "virtual visit".  Can I request my medication visits to be "Virtual"? Yes you may request it, but the decision is entirely up to the healthcare provider. Control substances require specific monitoring that requires Face-to-Face encounters. The number of encounters  and the extent of the monitoring is determined on a case by case basis.  Add a new contact to your smart phone and label it "PAIN CLINIC" Under this contact add the following numbers: Main: [phone] (Official Contact Number) Nurses: [phone] (These are outgoing only calling systems. Do not call this number.) Dr. Aurelie Dicenzo: [phone] or [phone] (Outgoing calls only. Do not call this number.)  ____________________________________________________________________________________________    ____________________________________________________________________________________________  Post-Procedure Discharge Instructions  Instructions: Apply ice:  Purpose: This will minimize any swelling and discomfort after procedure.  When: Day of procedure, as soon as you get home. How: Fill a plastic sandwich bag with crushed ice. Cover it with a small towel and apply   to injection site. How long: (15 min on, 15 min off) Apply for 15 minutes then remove x 15 minutes.  Repeat sequence on  day of procedure, until you go to bed. Apply heat:  Purpose: To treat any soreness and discomfort from the procedure. When: Starting the next day after the procedure. How: Apply heat to procedure site starting the day following the procedure. How long: May continue to repeat daily, until discomfort goes away. Food intake: Start with clear liquids (like water) and advance to regular food, as tolerated.  Physical activities: Keep activities to a minimum for the first 8 hours after the procedure. After that, then as tolerated. Driving: If you have received any sedation, be responsible and do not drive. You are not allowed to drive for 24 hours after having sedation. Blood thinner: (Applies only to those taking blood thinners) You may restart your blood thinner 6 hours after your procedure. Insulin: (Applies only to Diabetic patients taking insulin) As soon as you can eat, you may resume your normal dosing schedule. Infection prevention: Keep procedure site clean and dry. Shower daily and clean area with soap and water. Post-procedure Pain Diary: Extremely important that this be done correctly and accurately. Recorded information will be used to determine the next step in treatment. For the purpose of accuracy, follow these rules: Evaluate only the area treated. Do not report or include pain from an untreated area. For the purpose of this evaluation, ignore all other areas of pain, except for the treated area. After your procedure, avoid taking a long nap and attempting to complete the pain diary after you wake up. Instead, set your alarm clock to go off every hour, on the hour, for the initial 8 hours after the procedure. Document the duration of the numbing medicine, and the relief you are getting from it. Do not go to sleep and attempt to complete it later. It will not be accurate. If you received sedation, it is likely that you were given a medication that may cause amnesia. Because of this,  completing the diary at a later time may cause the information to be inaccurate. This information is needed to plan your care. Follow-up appointment: Keep your post-procedure follow-up evaluation appointment after the procedure (usually 2 weeks for most procedures, 6 weeks for radiofrequencies). DO NOT FORGET to bring you pain diary with you.   Expect: (What should I expect to see with my procedure?) From numbing medicine (AKA: Local Anesthetics): Numbness or decrease in pain. You may also experience some weakness, which if present, could last for the duration of the local anesthetic. Onset: Full effect within 15 minutes of injected. Duration: It will depend on the type of local anesthetic used. On the average, 1 to 8 hours.  From steroids (Applies only if steroids were used): Decrease in swelling or inflammation. Once inflammation is improved, relief of the pain will follow. Onset of benefits: Depends on the amount of swelling present. The more swelling, the longer it will take for the benefits to be seen. In some cases, up to 10 days. Duration: Steroids will stay in the system x 2 weeks. Duration of benefits will depend on multiple posibilities including persistent irritating factors. Side-effects: If present, they may typically last 2 weeks (the duration of the steroids). Frequent: Cramps (if they occur, drink Gatorade and take over-the-counter Magnesium 450-500 mg once to twice a day); water retention with temporary weight gain; increases in blood sugar; decreased immune system response; increased appetite. Occasional: Facial flushing (  red, warm cheeks); mood swings; menstrual changes. Uncommon: Long-term decrease or suppression of natural hormones; bone thinning. (These are more common with higher doses or more frequent use. This is why we prefer that our patients avoid having any injection therapies in other practices.)  Very Rare: Severe mood changes; psychosis; aseptic necrosis. From  procedure: Some discomfort is to be expected once the numbing medicine wears off. This should be minimal if ice and heat are applied as instructed.  Call if: (When should I call?) You experience numbness and weakness that gets worse with time, as opposed to wearing off. New onset bowel or bladder incontinence. (Applies only to procedures done in the spine)  Emergency Numbers: Durning business hours (Monday - Thursday, 8:00 AM - 4:00 PM) (Friday, 9:00 AM - 12:00 Noon): [phone] After hours: [phone] NOTE: If you are having a problem and are unable connect with, or to talk to a provider, then go to your nearest urgent care or emergency department. If the problem is serious and urgent, please call 911. ____________________________________________________________________________________________    

## 2022-05-14 ENCOUNTER — Telehealth: Payer: Self-pay

## 2022-05-14 NOTE — Telephone Encounter (Signed)
Post procedure phone call.  LM 
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
## 2022-05-26 ENCOUNTER — Telehealth: Payer: Self-pay

## 2022-05-26 NOTE — Telephone Encounter (Signed)
Patient answered phone but did not have time to talk. Will call back to discuss procedure

## 2022-05-27 ENCOUNTER — Ambulatory Visit: Payer: Medicare Other | Attending: Pain Medicine | Admitting: Pain Medicine

## 2022-05-27 DIAGNOSIS — G8929 Other chronic pain: Secondary | ICD-10-CM | POA: Diagnosis not present

## 2022-05-27 DIAGNOSIS — M961 Postlaminectomy syndrome, not elsewhere classified: Secondary | ICD-10-CM

## 2022-05-27 DIAGNOSIS — M47816 Spondylosis without myelopathy or radiculopathy, lumbar region: Secondary | ICD-10-CM

## 2022-05-27 DIAGNOSIS — G894 Chronic pain syndrome: Secondary | ICD-10-CM

## 2022-05-27 DIAGNOSIS — M4310 Spondylolisthesis, site unspecified: Secondary | ICD-10-CM | POA: Diagnosis not present

## 2022-05-27 DIAGNOSIS — M47817 Spondylosis without myelopathy or radiculopathy, lumbosacral region: Secondary | ICD-10-CM | POA: Diagnosis not present

## 2022-05-27 DIAGNOSIS — M545 Low back pain, unspecified: Secondary | ICD-10-CM

## 2022-05-28 NOTE — Addendum Note (Signed)
Addended by: Milinda Pointer A on: 05/28/2022 08:07 AM   Modules accepted: Level of Service

## 2022-06-02 ENCOUNTER — Telehealth: Payer: Self-pay | Admitting: Pain Medicine

## 2022-06-02 ENCOUNTER — Ambulatory Visit
Admission: RE | Admit: 2022-06-02 | Discharge: 2022-06-02 | Disposition: A | Discharge status: Home / Self Care | Payer: Medicare Other | Source: Ambulatory Visit | Attending: Pain Medicine | Admitting: Pain Medicine

## 2022-06-02 DIAGNOSIS — G894 Chronic pain syndrome: Secondary | ICD-10-CM | POA: Diagnosis present

## 2022-06-02 DIAGNOSIS — M47816 Spondylosis without myelopathy or radiculopathy, lumbar region: Secondary | ICD-10-CM | POA: Diagnosis present

## 2022-06-02 DIAGNOSIS — M47817 Spondylosis without myelopathy or radiculopathy, lumbosacral region: Secondary | ICD-10-CM | POA: Diagnosis present

## 2022-06-02 DIAGNOSIS — M545 Low back pain, unspecified: Secondary | ICD-10-CM

## 2022-06-02 DIAGNOSIS — M4310 Spondylolisthesis, site unspecified: Secondary | ICD-10-CM | POA: Insufficient documentation

## 2022-06-02 DIAGNOSIS — G8929 Other chronic pain: Secondary | ICD-10-CM | POA: Insufficient documentation

## 2022-06-02 DIAGNOSIS — M961 Postlaminectomy syndrome, not elsewhere classified: Secondary | ICD-10-CM

## 2022-06-02 NOTE — Telephone Encounter (Signed)
Patient wanted Dr Dossie Arbour to know she has completed the xrays he ordered and would like results when they are ready.

## 2022-06-02 NOTE — Telephone Encounter (Signed)
Ok. Will call her when they are finalized.

## 2022-06-03 NOTE — Telephone Encounter (Signed)
Patient called and given the results.

## 2022-06-19 ENCOUNTER — Other Ambulatory Visit: Payer: Self-pay | Admitting: Internal Medicine

## 2022-07-28 ENCOUNTER — Ambulatory Visit (INDEPENDENT_AMBULATORY_CARE_PROVIDER_SITE_OTHER): Payer: Medicare Other | Admitting: Internal Medicine

## 2022-07-28 ENCOUNTER — Encounter: Payer: Self-pay | Admitting: Internal Medicine

## 2022-07-28 VITALS — BP 148/72 | HR 70 | Temp 98.0°F | Resp 16 | Ht 64.0 in | Wt 248.6 lb

## 2022-07-28 DIAGNOSIS — R0602 Shortness of breath: Secondary | ICD-10-CM

## 2022-07-28 DIAGNOSIS — G4733 Obstructive sleep apnea (adult) (pediatric): Secondary | ICD-10-CM | POA: Diagnosis not present

## 2022-07-28 DIAGNOSIS — Z7722 Contact with and (suspected) exposure to environmental tobacco smoke (acute) (chronic): Secondary | ICD-10-CM | POA: Diagnosis not present

## 2022-07-28 DIAGNOSIS — Z7189 Other specified counseling: Secondary | ICD-10-CM | POA: Diagnosis not present
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
## 2022-08-20 ENCOUNTER — Ambulatory Visit

## 2022-09-03 ENCOUNTER — Ambulatory Visit (INDEPENDENT_AMBULATORY_CARE_PROVIDER_SITE_OTHER): Payer: Medicare PPO

## 2022-09-03 DIAGNOSIS — G4733 Obstructive sleep apnea (adult) (pediatric): Secondary | ICD-10-CM

## 2022-09-03 NOTE — Progress Notes (Signed)
95 percentile pressure 11.7   95th percentile leak 1.4   apnea index 1.0 /hr  apnea-hypopnea index  1.0 /hr   total days used  >4 hr 80 days  total days used <4 hr 5 days  Total compliance 89 percent  She is doing great. No problems or questions at this time.    Pt was seen by Claiborne Billings  RRT/RCP  from [facility]

## 2022-09-09 ENCOUNTER — Encounter: Payer: Self-pay | Admitting: Internal Medicine

## 2022-09-09 ENCOUNTER — Ambulatory Visit: Payer: Medicare PPO | Admitting: Internal Medicine

## 2022-09-09 VITALS — BP 156/76 | HR 73 | Temp 98.4°F | Resp 16 | Ht 64.0 in | Wt 249.6 lb

## 2022-09-09 DIAGNOSIS — E559 Vitamin D deficiency, unspecified: Secondary | ICD-10-CM

## 2022-09-09 DIAGNOSIS — J438 Other emphysema: Secondary | ICD-10-CM | POA: Diagnosis not present

## 2022-09-09 DIAGNOSIS — R0602 Shortness of breath: Secondary | ICD-10-CM | POA: Diagnosis not present

## 2022-09-09 DIAGNOSIS — E039 Hypothyroidism, unspecified: Secondary | ICD-10-CM | POA: Diagnosis not present

## 2022-09-09 DIAGNOSIS — E782 Mixed hyperlipidemia: Secondary | ICD-10-CM | POA: Diagnosis not present

## 2022-09-09 DIAGNOSIS — G4733 Obstructive sleep apnea (adult) (pediatric): Secondary | ICD-10-CM

## 2022-09-09 MED ORDER — DULOXETINE HCL 20 MG PO CPEP: 20.0000 mg | ORAL_CAPSULE | Freq: Every day | ORAL | 3 refills | Status: DC

## 2022-09-09 MED ORDER — TRIAMTERENE-HCTZ 37.5-25 MG PO TABS: ORAL_TABLET | ORAL | 3 refills | Status: DC
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
## 2022-09-13 LAB — CBC WITH DIFFERENTIAL/PLATELET
Basophils Absolute: 0.1 10*3/uL (ref 0.0–0.2)
Basos: 1 %
EOS (ABSOLUTE): 0.1 10*3/uL (ref 0.0–0.4)
Eos: 2 %
Hematocrit: 36.5 % (ref 34.0–46.6)
Hemoglobin: 12.5 g/dL (ref 11.1–15.9)
Immature Grans (Abs): 0 10*3/uL (ref 0.0–0.1)
Immature Granulocytes: 0 %
Lymphocytes Absolute: 2 10*3/uL (ref 0.7–3.1)
Lymphs: 27 %
MCH: 30.6 pg (ref 26.6–33.0)
MCHC: 34.2 g/dL (ref 31.5–35.7)
MCV: 90 fL (ref 79–97)
Monocytes Absolute: 0.6 10*3/uL (ref 0.1–0.9)
Monocytes: 8 %
Neutrophils Absolute: 4.7 10*3/uL (ref 1.4–7.0)
Neutrophils: 62 %
Platelets: 218 10*3/uL (ref 150–450)
RBC: 4.08 x10E6/uL (ref 3.77–5.28)
RDW: 12.2 % (ref 11.7–15.4)
WBC: 7.5 10*3/uL (ref 3.4–10.8)

## 2022-09-13 LAB — B12 AND FOLATE PANEL
Folate: 13.3 ng/mL (ref >3.0)
Vitamin B-12: 552 pg/mL (ref 232–1245)

## 2022-09-13 LAB — COMPREHENSIVE METABOLIC PANEL
ALT: 17 IU/L (ref 0–32)
AST: 21 IU/L (ref 0–40)
Albumin/Globulin Ratio: 1.6 (ref 1.2–2.2)
Albumin: 4 g/dL (ref 3.8–4.8)
Alkaline Phosphatase: 83 IU/L (ref 44–121)
BUN/Creatinine Ratio: 19 (ref 12–28)
BUN: 15 mg/dL (ref 8–27)
Bilirubin Total: 0.3 mg/dL (ref 0.0–1.2)
CO2: 22 mmol/L (ref 20–29)
Calcium: 8.8 mg/dL (ref 8.7–10.3)
Chloride: 105 mmol/L (ref 96–106)
Creatinine, Ser: 0.79 mg/dL (ref 0.57–1.00)
Globulin, Total: 2.5 g/dL (ref 1.5–4.5)
Glucose: 81 mg/dL (ref 70–99)
Potassium: 4.2 mmol/L (ref 3.5–5.2)
Sodium: 143 mmol/L (ref 134–144)
Total Protein: 6.5 g/dL (ref 6.0–8.5)
eGFR: 79 mL/min/{1.73_m2} (ref >59)

## 2022-09-13 LAB — LIPID PANEL WITH LDL/HDL RATIO
Cholesterol, Total: 222 mg/dL — ABNORMAL HIGH (ref 100–199)
HDL: 55 mg/dL (ref >39)
LDL Chol Calc (NIH): 129 mg/dL — ABNORMAL HIGH (ref 0–99)
LDL/HDL Ratio: 2.3 ratio (ref 0.0–3.2)
Triglycerides: 217 mg/dL — ABNORMAL HIGH (ref 0–149)
VLDL Cholesterol Cal: 38 mg/dL (ref 5–40)

## 2022-09-13 LAB — IRON,TIBC AND FERRITIN PANEL
Ferritin: 113 ng/mL (ref 15–150)
Iron Saturation: 35 % (ref 15–55)
Iron: 121 ug/dL (ref 27–139)
Total Iron Binding Capacity: 347 ug/dL (ref 250–450)
UIBC: 226 ug/dL (ref 118–369)

## 2022-09-13 LAB — 25-HYDROXY VITAMIN D LCMS D2+D3
25-Hydroxy, Vitamin D-2: 36 ng/mL
25-Hydroxy, Vitamin D-3: 27 ng/mL
25-Hydroxy, Vitamin D: 63 ng/mL

## 2022-09-13 LAB — TSH: TSH: 6.43 u[IU]/mL — ABNORMAL HIGH (ref 0.450–4.500)

## 2022-09-13 LAB — T4, FREE: Free T4: 1.07 ng/dL (ref 0.82–1.77)

## 2022-09-16 ENCOUNTER — Encounter: Payer: Self-pay | Admitting: Internal Medicine

## 2022-09-16 ENCOUNTER — Ambulatory Visit (INDEPENDENT_AMBULATORY_CARE_PROVIDER_SITE_OTHER): Payer: Medicare PPO | Admitting: Internal Medicine

## 2022-09-16 VITALS — BP 152/83 | HR 69 | Temp 98.4°F | Resp 16 | Ht 64.0 in | Wt 245.4 lb

## 2022-09-16 DIAGNOSIS — Z6841 Body Mass Index (BMI) 40.0 and over, adult: Secondary | ICD-10-CM | POA: Diagnosis not present

## 2022-09-16 DIAGNOSIS — E039 Hypothyroidism, unspecified: Secondary | ICD-10-CM

## 2022-09-16 DIAGNOSIS — G8929 Other chronic pain: Secondary | ICD-10-CM

## 2022-09-16 DIAGNOSIS — I10 Essential (primary) hypertension: Secondary | ICD-10-CM | POA: Diagnosis not present

## 2022-09-16 DIAGNOSIS — M5442 Lumbago with sciatica, left side: Secondary | ICD-10-CM | POA: Diagnosis not present

## 2022-09-16 DIAGNOSIS — M5441 Lumbago with sciatica, right side: Secondary | ICD-10-CM

## 2022-09-16 MED ORDER — DULOXETINE HCL 30 MG PO CPEP: 30.0000 mg | ORAL_CAPSULE | Freq: Every day | ORAL | 3 refills | Status: DC

## 2022-09-16 MED ORDER — LIDOCAINE 4 % EX PTCH: 1.0000 | MEDICATED_PATCH | CUTANEOUS | 3 refills | Status: DC

## 2022-09-16 MED ORDER — CELECOXIB 100 MG PO CAPS: ORAL_CAPSULE | ORAL | 1 refills | Status: DC

## 2022-09-16 MED ORDER — SYNTHROID 100 MCG PO TABS: 100.0000 ug | ORAL_TABLET | Freq: Every day | ORAL | 3 refills | Status: DC
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
## 2022-09-17 ENCOUNTER — Telehealth: Payer: Self-pay | Admitting: Internal Medicine

## 2022-09-17 NOTE — Telephone Encounter (Signed)
Received call from [facility] w/ Dr. De Hollingshead office stating they are still patient's pcp. I s/w patient, she stated she will call them in the morning to let them know she has changed pcp to [id number]
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
## 2022-09-21 NOTE — Patient Instructions (Signed)
____________________________________________________________________________________________  Opioid Pain Medication Update  To: All patients taking opioid pain medications. (I.e.: hydrocodone, hydromorphone, oxycodone, oxymorphone, morphine, codeine, methadone, tapentadol, tramadol, buprenorphine, fentanyl, etc.)  Re: Updated review of side effects and adverse reactions of opioid analgesics, as well as new information about long term effects of this class of medications.  Direct risks of long-term opioid therapy are not limited to opioid addiction and overdose. Potential medical risks include serious fractures, breathing problems during sleep, hyperalgesia, immunosuppression, chronic constipation, bowel obstruction, myocardial infarction, and tooth decay secondary to xerostomia.  Unpredictable adverse effects that can occur even if you take your medication correctly: Cognitive impairment, respiratory depression, and death. Most people think that if they take their medication "correctly", and "as instructed", that they will be safe. Nothing could be farther from the truth. In reality, a significant amount of recorded deaths associated with the use of opioids has occurred in individuals that had taken the medication for a long time, and were taking their medication correctly. The following are examples of how this can happen: Patient taking his/her medication for a long time, as instructed, without any side effects, is given a certain antibiotic or another unrelated medication, which in turn triggers a "Drug-to-drug interaction" leading to disorientation, cognitive impairment, impaired reflexes, respiratory depression or an untoward event leading to serious bodily harm or injury, including death.  Patient taking his/her medication for a long time, as instructed, without any side effects, develops an acute impairment of liver and/or kidney function. This will lead to a rapid inability of the body to  breakdown and eliminate their pain medication, which will result in effects similar to an "overdose", but with the same medicine and dose that they had always taken. This again may lead to disorientation, cognitive impairment, impaired reflexes, respiratory depression or an untoward event leading to serious bodily harm or injury, including death.  A similar problem will occur with patients as they grow older and their liver and kidney function begins to decrease as part of the aging process.  Background information: Historically, the original case for using long-term opioid therapy to treat chronic noncancer pain was based on safety assumptions that subsequent experience has called into question. In 1996, the American Pain Society and the [place] [employer] issued a consensus statement supporting long-term opioid therapy. This statement acknowledged the dangers of opioid prescribing but concluded that the risk for addiction was low; respiratory depression induced by opioids was short-lived, occurred mainly in opioid-naive patients, and was antagonized by pain; tolerance was not a common problem; and efforts to control diversion should not constrain opioid prescribing. This has now proven to be wrong. Experience regarding the risks for opioid addiction, misuse, and overdose in community practice has failed to support these assumptions.  According to the Centers for Disease Control and Prevention, fatal overdoses involving opioid analgesics have increased sharply over the past decade. Currently, more than 96,700 people die from drug overdoses every year. Opioids are a factor in 7 out of every 10 overdose deaths. Deaths from drug overdose have surpassed motor vehicle accidents as the leading cause of death for individuals between the ages of 3 and 36.  Clinical data suggest that neuroendocrine dysfunction may be very common in both men and women, potentially causing hypogonadism, erectile  dysfunction, infertility, decreased libido, osteoporosis, and depression. Recent studies linked higher opioid dose to increased opioid-related mortality. Controlled observational studies reported that long-term opioid therapy may be associated with increased risk for cardiovascular events. Subsequent meta-analysis concluded  that the safety of long-term opioid therapy in elderly patients has not been proven.   Side Effects and adverse reactions: Common side effects: Drowsiness (sedation). Dizziness. Nausea and vomiting. Constipation. Physical dependence -- Dependence often manifests with withdrawal symptoms when opioids are discontinued or decreased. Tolerance -- As you take repeated doses of opioids, you require increased medication to experience the same effect of pain relief. Respiratory depression -- This can occur in healthy people, especially with higher doses. However, people with COPD, asthma or other lung conditions may be even more susceptible to fatal respiratory impairment.  Uncommon side effects: An increased sensitivity to feeling pain and extreme response to pain (hyperalgesia). Chronic use of opioids can lead to this. Delayed gastric emptying (the process by which the contents of your stomach are moved into your small intestine). Muscle rigidity. Immune system and hormonal dysfunction. Quick, involuntary muscle jerks (myoclonus). Arrhythmia. Itchy skin (pruritus). Dry mouth (xerostomia).  Long-term side effects: Chronic constipation. Sleep-disordered breathing (SDB). Increased risk of bone fractures. Hypothalamic-pituitary-adrenal dysregulation. Increased risk of overdose.  RISKS: Fractures and Falls:  Opioids increase the risk and incidence of falls. This is of particular importance in elderly patients.  Endocrine System:  Long-term administration is associated with endocrine abnormalities. Influences on both the hypothalamic-pituitary-adrenal axis?and the  hypothalamic-pituitary-gonadal axis have been demonstrated with consequent hypogonadism and adrenal insufficiency in both sexes. Hypogonadism and decreased levels of dehydroepiandrosterone sulfate have been reported in men and women. Endocrine effects can lead to: Amenorrhoea in women Reduced libido in both sexes Erectile dysfunction in men Infertility Depression and fatigue Patients (particularly women of childbearing age) should avoid opioids. There is insufficient evidence to recommend routine monitoring of asymptomatic patients taking opioids in the long-term for hormonal deficiencies.  Immune System: Human studies have demonstrated that opioids have an immunomodulating effect. These effects are mediated via opioid receptors both on immune effector cells and in the central nervous system. Opioids have been demonstrated to have adverse effects on antimicrobial response and anti-tumour surveillance. Buprenorphine has been demonstrated to have no impact on immune function.  Opioid Induced Hyperalgesia: Human studies have demonstrated that prolonged use of opioids can lead to a state of abnormal pain sensitivity, sometimes called opioid induced hyperalgesia (OIH). Opioid induced hyperalgesia is not usually seen in the absence of tolerance to opioid analgesia. Clinically, hyperalgesia may be diagnosed if the patient on long-term opioid therapy presents with increased pain. This might be qualitatively and anatomically distinct from pain related to disease progression or to breakthrough pain resulting from development of opioid tolerance. Pain associated with hyperalgesia tends to be more diffuse than the pre-existing pain and less defined in quality. Management of opioid induced hyperalgesia requires opioid dose reduction.  Cancer: Chronic opioid therapy has been associated with an increased risk of cancer among noncancer patients with chronic pain. This association was more evident in chronic  strong opioid users. Chronic opioid consumption causes significant pathological changes in the small intestine and colon. Epidemiological studies have found that there is a link between opium dependence and initiation of gastrointestinal cancers. Cancer is the second leading cause of death after cardiovascular disease. Chronic use of opioids can cause multiple conditions such as GERD, immunosuppression and renal damage as well as carcinogenic effects, which are associated with the incidence of cancers.   Mortality: Long-term opioid use has been associated with increased mortality among patients with chronic non-cancer pain (CNCP).  Prescription of long-acting opioids for chronic noncancer pain was associated with a significantly increased risk of all-cause mortality,  including deaths from causes other than overdose.  Reference: Von Korff M, Kolodny A, Deyo RA, Chou R. Long-term opioid therapy reconsidered. Ann Intern Med. 2011 Sep 6;155(5):325-8. doi: 10.7326/[id number]. PMID: 91638466; PMCID: [id number]. Morley Kos, Hayward RA, Dunn KM, [place] KP. Risk of adverse events in patients prescribed long-term opioids: A cohort study in the [place] Clinical Practice Research D[employer]. Eur J Pain. 2019 May;23(5):908-922. doi: 10.1002/ejp.1357. Epub 2019 Jan 31. PMID: 93903009. Colameco S, Coren JS, Ciervo CA. Continuous opioid treatment for chronic noncancer pain: a time for moderation in prescribing. Postgrad Med. 2009 Jul;121(4):61-6. doi: 10.3810/pgm.2009.07.2032. PMID: 23300762. Heywood Bene RN, [place] SD, Blazina I, Rosalio Loud, Bougatsos C, Deyo RA. The effectiveness and risks of long-term opioid therapy for chronic pain: a systematic review for a [employer] of Health Pathways to [employer]. Ann Intern Med. 2015 Feb 17;162(4):276-86. doi: 26.3335/K56-2563. PMID: 89373428. Marjory Sneddon [facility], [place] DM. NCHS Data Brief No. 22. [place]:  Centers for Disease Control and Prevention; 2009. Sep, Increase in Fatal Poisonings Involving Opioid Analgesics in the [place], 1999-2006. Song IA, Choi HR, Oh TK. Long-term opioid use and mortality in patients with chronic non-cancer pain: Ten-year follow-up study in [place] from 2010 through 2019. EClinicalMedicine. 2022 Jul 18;51:101558. doi: 10.1016/j.eclinm.2022.768115. PMID: 72620355; PMCID: [id number]. Huser, W., Schubert, T., Vogelmann, T. et al. All-cause mortality in patients with long-term opioid therapy compared with non-opioid analgesics for chronic non-cancer pain: a database study. East End Med 18, 162 (2020). [email or web address].[email or web address].com/ Rashidian H, Roxy Cedar, Malekzadeh R, Haghdoost AA. An Ecological Study of the Association between Opiate Use and Incidence of Cancers. Addict Health. 2016 Fall;8(4):252-260. PMID: 64680321; PMCID: [id number].  Our Goal: Our goal is to control your pain with means other than the use of opioid pain medications.  Our Recommendation: Talk to your physician about coming off of these medications. We can assist you with the tapering down and stopping these medicines. Based on the new information, even if you cannot completely stop the medication, a decrease in the dose may be associated with a lesser risk. Ask for other means of controlling the pain. Decrease or eliminate those factors that significantly contribute to your pain such as smoking, obesity, and a diet heavily tilted towards "inflammatory" nutrients.  ____________________________________________________________________________________________     ____________________________________________________________________________________________  Carron Brazen Pain Medication Shortage  The [place] is experiencing worsening drug shortages. These have had a negative widespread effect on patient care and treatment. Not expected to improve any time soon. Predicted to last past  2029.   Drug shortage list (generic names) Oxycodone IR Oxycodone/APAP Oxymorphone IR Hydromorphone Hydrocodone/APAP Morphine  Where is the problem?  Manufacturing and supply level.  Will this shortage affect you?  Only if you take any of the above pain medications.  How? You may be unable to fill your prescription.  Your pharmacist may offer a "partial fill" of your prescription. (Warning: Do not accept partial fills.) Prescriptions partially filled cannot be transferred to another pharmacy. Read our Medication Rules and Regulation. Depending on how much medicine you are dependent on, you may experience withdrawals when unable to get the medication.  Recommendations: Consider ending your dependence on opioid pain medications. Ask your pain specialist to assist you with the process. Consider switching to a medication currently not in shortage, such as Buprenorphine. Talk to your pain specialist about this option. Consider decreasing your pain medication requirements by managing tolerance thru "Drug Holidays". This may help minimize withdrawals, should you run  out of medicine. Control your pain thru the use of non-pharmacological interventional therapies.   Your prescriber: Prescribers cannot be blamed for shortages. Medication manufacturing and supply issues cannot be fixed by the prescriber.   NOTE: The prescriber is not responsible for supplying the medication, or solving supply issues. Work with your pharmacist to solve it. The patient is responsible for the decision to take or continue taking the medication and for identifying and securing a legal supply source. By law, supplying the medication is the job and responsibility of the pharmacy. The prescriber is responsible for the evaluation, monitoring, and prescribing of these medications.   Prescribers will NOT: Re-issue prescriptions that have been partially filled. Re-issue prescriptions already sent to a pharmacy.  Re-send  prescriptions to a different pharmacy because yours did not have your medication. Ask pharmacist to order more medicine or transfer the prescription to another pharmacy. (Read below.)  New 2023 regulation: "April 18, 2022 Revised Regulation Allows DEA-Registered Pharmacies to Transfer Electronic Prescriptions at a Patient's Request [place] Patients now have the ability to request their electronic prescription be transferred to another pharmacy without having to go back to their practitioner to initiate the request. This revised regulation went into effect on Monday, April 14, 2022.     At a patient's request, a DEA-registered retail pharmacy can now transfer an electronic prescription for a controlled substance (schedules II-V) to another DEA-registered retail pharmacy. Prior to this change, patients would have to go through their practitioner to cancel their prescription and have it re-issued to a different pharmacy. The process was taxing and time consuming for both patients and practitioners.    The Drug Enforcement Administration [facility]) published its intent to revise the process for transferring electronic prescriptions on July 06, 2020.  The final rule was published in the federal register on March 13, 2022 and went into effect 30 days later.  Under the final rule, a prescription can only be transferred once between pharmacies, and only if allowed under existing state or other applicable law. The prescription must remain in its electronic form; may not be altered in any way; and the transfer must be communicated directly between two licensed pharmacists. It's important to note, any authorized refills transfer with the original prescription, which means the entire prescription will be filled at the same pharmacy".  Reference: [email or web address] [facility]  website announcement)  [email or web address] ([place])   [employer] / Vol. 88, No. 143 / Thursday, March 13, 2022 / Rules and Regulations DEPARTMENT OF JUSTICE  Drug Enforcement Administration  21 CFR Part 1306  [Docket No. [id number]]  RIN [id number] Transfer of Electronic Prescriptions for Schedules II-V Controlled Substances Between Pharmacies for Initial Filling  ____________________________________________________________________________________________     _______________________________________________________________________  Medication Rules  Purpose: To inform patients, and their family members, of our medication rules and regulations.  Applies to: All patients receiving prescriptions from our practice (written or electronic).  Pharmacy of record: This is the pharmacy where your electronic prescriptions will be sent. Make sure we have the correct one.  Electronic prescriptions: In compliance with the [place] (STOP) Act of 2017 (Session Lanny Cramp [phone]), effective August 18, 2018, all controlled substances must be electronically prescribed. Written prescriptions, faxing, or calling prescriptions to a pharmacy will no longer be done.  Prescription refills: These will be provided only during in-person appointments. No medications will be renewed without a "face-to-face" evaluation with your provider. Applies to  all prescriptions.  NOTE: The following applies primarily to controlled substances (Opioid* Pain Medications).   Type of encounter (visit): For patients receiving controlled substances, face-to-face visits are required. (Not an option and not up to the patient.)  Patient's responsibilities: Pain Pills: Bring all pain pills to every appointment (except for procedure appointments). Pill Bottles: Bring pills in original pharmacy bottle. Bring  bottle, even if empty. Always bring the bottle of the most recent fill.  Medication refills: You are responsible for knowing and keeping track of what medications you are taking and when is it that you will need a refill. The day before your appointment: write a list of all prescriptions that need to be refilled. The day of the appointment: give the list to the admitting nurse. Prescriptions will be written only during appointments. No prescriptions will be written on procedure days. If you forget a medication: it will not be "Called in", "Faxed", or "electronically sent". You will need to get another appointment to get these prescribed. No early refills. Do not call asking to have your prescription filled early. Partial  or short prescriptions: Occasionally your pharmacy may not have enough pills to fill your prescription.  NEVER ACCEPT a partial fill or a prescription that is short of the total amount of pills that you were prescribed.  With controlled substances the law allows 72 hours for the pharmacy to complete the prescription.  If the prescription is not completed within 72 hours, the pharmacist will require a new prescription to be written. This means that you will be short on your medicine and we WILL NOT send another prescription to complete your original prescription.  Instead, request the pharmacy to send a carrier to a nearby branch to get enough medication to provide you with your full prescription. Prescription Accuracy: You are responsible for carefully inspecting your prescriptions before leaving our office. Have the discharge nurse carefully go over each prescription with you, before taking them home. Make sure that your name is accurately spelled, that your address is correct. Check the name and dose of your medication to make sure it is accurate. Check the number of pills, and the written instructions to make sure they are clear and accurate. Make sure that you are given enough medication  to last until your next medication refill appointment. Taking Medication: Take medication as prescribed. When it comes to controlled substances, taking less pills or less frequently than prescribed is permitted and encouraged. Never take more pills than instructed. Never take the medication more frequently than prescribed.  Inform other Doctors: Always inform, all of your healthcare providers, of all the medications you take. Pain Medication from other Providers: You are not allowed to accept any additional pain medication from any other Doctor or Healthcare provider. There are two exceptions to this rule. (see below) In the event that you require additional pain medication, you are responsible for notifying us, as stated below. Cough Medicine: Often these contain an opioid, such as codeine or hydrocodone. Never accept or take cough medicine containing these opioids if you are already taking an opioid* medication. The combination may cause respiratory failure and death. Medication Agreement: You are responsible for carefully reading and following our Medication Agreement. This must be signed before receiving any prescriptions from our practice. Safely store a copy of your signed Agreement. Violations to the Agreement will result in no further prescriptions. (Additional copies of our Medication Agreement are available upon request.) Laws, Rules, & Regulations: All patients are expected to follow  all Federal and [employer], Rules, & Regulations. Ignorance of the Laws does not constitute a valid excuse.  Illegal drugs and Controlled Substances: The use of illegal substances (including, but not limited to marijuana and its derivatives) and/or the illegal use of any controlled substances is strictly prohibited. Violation of this rule may result in the immediate and permanent discontinuation of any and all prescriptions being written by our practice. The use of any illegal substances is  prohibited. Adopted CDC guidelines & recommendations: Target dosing levels will be at or below 60 MME/day. Use of benzodiazepines** is not recommended.  Exceptions: There are only two exceptions to the rule of not receiving pain medications from other Healthcare Providers. Exception #1 (Emergencies): In the event of an emergency (i.e.: accident requiring emergency care), you are allowed to receive additional pain medication. However, you are responsible for: As soon as you are able, call our office [phone], at any time of the day or night, and leave a message stating your name, the date and nature of the emergency, and the name and dose of the medication prescribed. In the event that your call is answered by a member of our staff, make sure to document and save the date, time, and the name of the person that took your information.  Exception #2 (Planned Surgery): In the event that you are scheduled by another doctor or dentist to have any type of surgery or procedure, you are allowed (for a period no longer than 30 days), to receive additional pain medication, for the acute post-op pain. However, in this case, you are responsible for picking up a copy of our "Post-op Pain Management for Surgeons" handout, and giving it to your surgeon or dentist. This document is available at our office, and does not require an appointment to obtain it. Simply go to our office during business hours (Monday-Thursday from 8:00 AM to 4:00 PM) (Friday 8:00 AM to 12:00 Noon) or if you have a scheduled appointment with [place], prior to your surgery, and ask for it by name. In addition, you are responsible for: calling our office [phone], at any time of the day or night, and leaving a message stating your name, name of your surgeon, type of surgery, and date of procedure or surgery. Failure to comply with your responsibilities may result in termination of therapy involving the controlled substances. Medication Agreement  Violation. Following the above rules, including your responsibilities will help you in avoiding a Medication Agreement Violation ("Breaking your Pain Medication Contract").  Consequences:  Not following the above rules may result in permanent discontinuation of medication prescription therapy.  *Opioid medications include: morphine, codeine, oxycodone, oxymorphone, hydrocodone, hydromorphone, meperidine, tramadol, tapentadol, buprenorphine, fentanyl, methadone. **Benzodiazepine medications include: diazepam (Valium), alprazolam (Xanax), clonazepam (Klonopine), lorazepam (Ativan), clorazepate (Tranxene), chlordiazepoxide (Librium), estazolam (Prosom), oxazepam (Serax), temazepam (Restoril), triazolam (Halcion) (Last updated: 06/10/2022) ______________________________________________________________________    ______________________________________________________________________  Medication Recommendations and Reminders  Applies to: All patients receiving prescriptions (written and/or electronic).  Medication Rules & Regulations: You are responsible for reading, knowing, and following our "Medication Rules" document. These exist for your safety and that of others. They are not flexible and neither are we. Dismissing or ignoring them is an act of "non-compliance" that may result in complete and irreversible termination of such medication therapy. For safety reasons, "non-compliance" will not be tolerated. As with the [place] fundamental legal principle of "ignorance of the law is no defense", we will accept no excuses for not having read and  knowing the content of documents provided to you by our practice.  Pharmacy of record:  Definition: This is the pharmacy where your electronic prescriptions will be sent.  We do not endorse any particular pharmacy. It is up to you and your insurance to decide what pharmacy to use.  We do not restrict you in your choice of pharmacy. However, once we write for  your prescriptions, we will NOT be re-sending more prescriptions to fix restricted supply problems created by your pharmacy, or your insurance.  The pharmacy listed in the electronic medical record should be the one where you want electronic prescriptions to be sent. If you choose to change pharmacy, simply notify our nursing staff. Changes will be made only during your regular appointments and not over the phone.  Recommendations: Keep all of your pain medications in a safe place, under lock and key, even if you live alone. We will NOT replace lost, stolen, or damaged medication. We do not accept "Police Reports" as proof of medications having been stolen. After you fill your prescription, take 1 week's worth of pills and put them away in a safe place. You should keep a separate, properly labeled bottle for this purpose. The remainder should be kept in the original bottle. Use this as your primary supply, until it runs out. Once it's gone, then you know that you have 1 week's worth of medicine, and it is time to come in for a prescription refill. If you do this correctly, it is unlikely that you will ever run out of medicine. To make sure that the above recommendation works, it is very important that you make sure your medication refill appointments are scheduled at least 1 week before you run out of medicine. To do this in an effective manner, make sure that you do not leave the office without scheduling your next medication management appointment. Always ask the nursing staff to show you in your prescription , when your medication will be running out. Then arrange for the receptionist to get you a return appointment, at least 7 days before you run out of medicine. Do not wait until you have 1 or 2 pills left, to come in. This is very poor planning and does not take into consideration that we may need to cancel appointments due to bad weather, sickness, or emergencies affecting our staff. DO NOT ACCEPT A  "Partial Fill": If for any reason your pharmacy does not have enough pills/tablets to completely fill or refill your prescription, do not allow for a "partial fill". The law allows the pharmacy to complete that prescription within 72 hours, without requiring a new prescription. If they do not fill the rest of your prescription within those 72 hours, you will need a separate prescription to fill the remaining amount, which we will NOT provide. If the reason for the partial fill is your insurance, you will need to talk to the pharmacist about payment alternatives for the remaining tablets, but again, DO NOT ACCEPT A PARTIAL FILL, unless you can trust your pharmacist to obtain the remainder of the pills within 72 hours.  Prescription refills and/or changes in medication(s):  Prescription refills, and/or changes in dose or medication, will be conducted only during scheduled medication management appointments. (Applies to both, written and electronic prescriptions.) No refills on procedure days. No medication will be changed or started on procedure days. No changes, adjustments, and/or refills will be conducted on a procedure day. Doing so will interfere with the diagnostic portion of  the procedure. No phone refills. No medications will be "called into the pharmacy". No Fax refills. No weekend refills. No Holliday refills. No after hours refills.  Remember:  Business hours are:  Monday to Thursday 8:00 AM to 4:00 PM Provider's Schedule: Milinda Pointer, MD - Appointments are:  Medication management: Monday and Wednesday 8:00 AM to 4:00 PM Procedure day: Tuesday and Thursday 7:30 AM to 4:00 PM Gillis Santa, MD - Appointments are:  Medication management: Tuesday and Thursday 8:00 AM to 4:00 PM Procedure day: Monday and Wednesday 7:30 AM to 4:00 PM (Last update: 06/10/2022) ______________________________________________________________________     ____________________________________________________________________________________________  Drug Holidays  What is a "Drug Holiday"? Drug Holiday: is the name given to the process of slowly tapering down and temporarily stopping the pain medication for the purpose of decreasing or eliminating tolerance to the drug.  Benefits Improved effectiveness Decreased required effective dose Improved pain control End dependence on high dose therapy Decrease cost of therapy Uncovering "opioid-induced hyperalgesia". (OIH)  What is "opioid hyperalgesia"? It is a paradoxical increase in pain caused by exposure to opioids. Stopping the opioid pain medication, contrary to the expected, it actually decreases or completely eliminates the pain. Ref.: "A comprehensive review of opioid-induced hyperalgesia". Brion Aliment, et.al. Pain Physician. 2011 Mar-Apr;14(2):145-61.  What is tolerance? Tolerance: the progressive loss of effectiveness of a pain medicine due to repetitive use. A common problem of opioid pain medications.  How long should a "Drug Holiday" last? Effectiveness depends on the patient staying off all opioid pain medicines for a minimum of 14 consecutive days. (2 weeks)  How about just taking less of the medicine? Does not work. Will not accomplish goal of eliminating the excess receptors.  How about switching to a different pain medicine? (AKA. "Opioid rotation") Does not work. Creates the illusion of effectiveness by taking advantage of inaccurate equivalent dose calculations between different opioids. -This "technique" was promoted by studies funded by [employer], such as [employer], creators of "OxyContin".  Can I stop the medicine "cold [place]"? Depends. You should always coordinate with your Pain Specialist to make the transition as smoothly as possible. Avoid stopping the medicine abruptly without consulting. We recommend a "slow taper".  What is a slow  taper? Taper: refers to the gradual decrease in dose.   How do I stop/taper the dose? Slowly. Decrease the daily amount of pills that you take by one (1) pill every seven (7) days. This is called a "slow downward taper". Example: if you normally take four (4) pills per day, drop it to three (3) pills per day for seven (7) days, then to two (2) pills per day for seven (7) days, then to one (1) per day for seven (7) days, and then stop the medicine. The 14 day "Drug Holiday" starts on the first day without medicine.   Will I experience withdrawals? Unlikely with a slow taper.  What triggers withdrawals? Withdrawals are triggered by the sudden/abrupt stop of high dose opioids. Withdrawals can be avoided by slowly decreasing the dose over a prolonged period of time.  What are withdrawals? Symptoms associated with sudden/abrupt reduction/stopping of high-dose, long-term use of pain medication. Withdrawal are seldom seen on low dose therapy, or patients rarely taking opioid medication.  Early Withdrawal Symptoms may include: Agitation Anxiety Muscle aches Increased tearing Insomnia Runny nose Sweating Yawning  Late symptoms may include: Abdominal cramping Diarrhea Dilated pupils Goose bumps Nausea Vomiting  (Last update: 07/27/2022) ____________________________________________________________________________________________    ____________________________________________________________________________________________  WARNING: CBD (cannabidiol) &  Delta (Delta-8 tetrahydrocannabinol) products.   Applicable to:  All individuals currently taking or considering taking CBD (cannabidiol) and, more important, all patients taking opioid analgesic controlled substances (pain medication). (Example: oxycodone; oxymorphone; hydrocodone; hydromorphone; morphine; methadone; tramadol; tapentadol; fentanyl; buprenorphine; butorphanol; dextromethorphan; meperidine; codeine; etc.)  Introduction:   Recently there has been a drive towards the use of "natural" products for the treatment of different conditions, including pain anxiety and sleep disorders. Marijuana and hemp are two varieties of the cannabis genus plants. Marijuana and its derivatives are illegal, while hemp and its derivatives are not. Cannabidiol (CBD) and tetrahydrocannabinol (THC), are two natural compounds found in plants of the Cannabis genus. They can both be extracted from hemp or marijuana. Both compounds interact with your body's endocannabinoid system in very different ways. CBD is associated with pain relief (analgesia) while THC is associated with the psychoactive effects ("the high") obtained from the use of marijuana products. There are two main types of THC: Delta-9, which comes from the marijuana plant and it is illegal, and Delta-8, which comes from the hemp plant, and it is legal. (Both, Delta-9-THC and Delta-8-THC are psychoactive and give you "the high".)   Legality:  Marijuana and its derivatives: illegal Hemp and its derivatives: Legal (State dependent) UPDATE: (10/04/2021) The Drug Enforcement Agency ([place]) issued a letter stating that "delta" cannabinoids, including Delta-8-THCO and Delta-9-THCO, synthetically derived from hemp do not qualify as hemp and will be viewed as Schedule I drugs. (Schedule I drugs, substances, or chemicals are defined as drugs with no currently accepted medical use and a high potential for abuse. Some examples of Schedule I drugs are: heroin, lysergic acid diethylamide (LSD), marijuana (cannabis), 3,4-methylenedioxymethamphetamine (ecstasy), methaqualone, and peyote.) (https://jennings.com/)  Legal status of CBD in [place]:  "Conditionally Legal"  Reference: "FDA Regulation of Cannabis and Cannabis-Derived Products, Including Cannabidiol (CBD)" - SeekArtists.com.pt  Warning:   CBD is not FDA approved and has not undergo the same manufacturing controls as prescription drugs.  This means that the purity and safety of available CBD may be questionable. Most of the time, despite manufacturer's claims, it is contaminated with THC (delta-9-tetrahydrocannabinol - the chemical in marijuana responsible for the "HIGH").  When this is the case, the [facility] contaminant will trigger a positive urine drug screen (UDS) test for Marijuana (carboxy-THC).   The FDA recently put out a warning about 5 things that everyone should be aware of regarding Delta-8 THC: Delta-8 THC products have not been evaluated or approved by the FDA for safe use and may be marketed in ways that put the public health at risk. The FDA has received adverse event reports involving delta-8 THC-containing products. Delta-8 THC has psychoactive and intoxicating effects. Delta-8 THC manufacturing often involve use of potentially harmful chemicals to create the concentrations of delta-8 THC claimed in the marketplace. The final delta-8 THC product may have potentially harmful by-products (contaminants) due to the chemicals used in the process. Manufacturing of delta-8 THC products may occur in uncontrolled or unsanitary settings, which may lead to the presence of unsafe contaminants or other potentially harmful substances. Delta-8 THC products should be kept out of the reach of children and pets.  NOTE: Because a positive UDS for any illicit substance is a violation of our medication agreement, your opioid analgesics (pain medicine) may be permanently discontinued.  MORE ABOUT CBD  General Information: CBD was discovered in [birth year] and it is a derivative of the cannabis sativa genus plants (Marijuana and Hemp). It is one of the 113 identified  substances found in Marijuana. It accounts for up to 40% of the plant's extract. As of 2018, preliminary clinical studies on CBD included research for the treatment of anxiety, movement  disorders, and pain. CBD is available and consumed in multiple forms, including inhalation of smoke or vapor, as an aerosol spray, and by mouth. It may be supplied as an oil containing CBD, capsules, dried cannabis, or as a liquid solution. CBD is thought not to be as psychoactive as THC (delta-9-tetrahydrocannabinol - the chemical in marijuana responsible for the "HIGH"). Studies suggest that CBD may interact with different biological target receptors in the body, including cannabinoid and other neurotransmitter receptors. As of 2018 the mechanism of action for its biological effects has not been determined.  Side-effects  Adverse reactions: Dry mouth, diarrhea, decreased appetite, fatigue, drowsiness, malaise, weakness, sleep disturbances, and others.  Drug interactions:  CBD may interact with medications such as blood-thinners. CBD causes drowsiness on its own and it will increase drowsiness caused by other medications, including antihistamines (such as Benadryl), benzodiazepines (Xanax, Ativan, Valium), antipsychotics, antidepressants, opioids, alcohol and supplements such as kava, melatonin and St. John's Wort.  Other drug interactions: Brivaracetam (Briviact); Caffeine; Carbamazepine (Tegretol); Citalopram (Celexa); Clobazam (Onfi); Eslicarbazepine (Aptiom); Everolimus (Zostress); Lithium; Methadone (Dolophine); Rufinamide (Banzel); Sedative medications (CNS depressants); Sirolimus (Rapamune); Stiripentol (Diacomit); Tacrolimus (Prograf); Tamoxifen ; Soltamox); Topiramate (Topamax); Valproate; Warfarin (Coumadin); Zonisamide. (Last update: 07/28/2022) ____________________________________________________________________________________________   ____________________________________________________________________________________________  Naloxone Nasal Spray  Why am I receiving this medication? [place] STOP ACT requires that all patients taking high dose opioids or at risk of opioids  respiratory depression, be prescribed an opioid reversal agent, such as Naloxone (AKA: Narcan).  What is this medication? NALOXONE (nal OX one) treats opioid overdose, which causes slow or shallow breathing, severe drowsiness, or trouble staying awake. Call emergency services after using this medication. You may need additional treatment. Naloxone works by reversing the effects of opioids. It belongs to a group of medications called opioid blockers.  COMMON BRAND NAME(S): Kloxxado, Narcan  What should I tell my care team before I take this medication? They need to know if you have any of these conditions: Heart disease Substance use disorder An unusual or allergic reaction to naloxone, other medications, foods, dyes, or preservatives Pregnant or trying to get pregnant Breast-feeding  When to use this medication? This medication is to be used for the treatment of respiratory depression (less than 8 breaths per minute) secondary to opioid overdose.   How to use this medication? This medication is for use in the nose. Lay the person on their back. Support their neck with your hand and allow the head to tilt back before giving the medication. The nasal spray should be given into 1 nostril. After giving the medication, move the person onto their side. Do not remove or test the nasal spray until ready to use. Get emergency medical help right away after giving the first dose of this medication, even if the person wakes up. You should be familiar with how to recognize the signs and symptoms of a narcotic overdose. If more doses are needed, give the additional dose in the other nostril. Talk to your care team about the use of this medication in children. While this medication may be prescribed for children as young as newborns for selected conditions, precautions do apply.  Naloxone Overdosage: If you think you have taken too much of this medicine contact a poison control center or emergency room at  once.  NOTE: This medicine  is only for you. Do not share this medicine with others.  What if I miss a dose? This does not apply.  What may interact with this medication? This is only used during an emergency. No interactions are expected during emergency use. This list may not describe all possible interactions. Give your health care provider a list of all the medicines, herbs, non-prescription drugs, or dietary supplements you use. Also tell them if you smoke, drink alcohol, or use illegal drugs. Some items may interact with your medicine.  What should I watch for while using this medication? Keep this medication ready for use in the case of an opioid overdose. Make sure that you have the phone number of your care team and local hospital ready. You may need to have additional doses of this medication. Each nasal spray contains a single dose. Some emergencies may require additional doses. After use, bring the treated person to the nearest hospital or call 911. Make sure the treating care team knows that the person has received a dose of this medication. You will receive additional instructions on what to do during and after use of this medication before an emergency occurs.  What side effects may I notice from receiving this medication? Side effects that you should report to your care team as soon as possible: Allergic reactions--skin rash, itching, hives, swelling of the face, lips, tongue, or throat Side effects that usually do not require medical attention (report these to your care team if they continue or are bothersome): Constipation Dryness or irritation inside the nose Headache Increase in blood pressure Muscle spasms Stuffy nose Toothache This list may not describe all possible side effects. Call your doctor for medical advice about side effects. You may report side effects to FDA at 1-800-FDA-1088.  Where should I keep my medication? Because this is an emergency medication, you  should keep it with you at all times.  Keep out of the reach of children and pets. Store between 20 and 25 degrees C (68 and 77 degrees F). Do not freeze. Throw away any unused medication after the expiration date. Keep in original box until ready to use.  NOTE: This sheet is a summary. It may not cover all possible information. If you have questions about this medicine, talk to your doctor, pharmacist, or health care provider.   2023 Elsevier/Gold Standard (2021-04-12 00:00:00)  ____________________________________________________________________________________________   ____________________________________________________________________________________________  Patient Information update  To: All of our patients.  Re: Name change.  It has been made official that our current name, "[facility]"   will soon be changed to "[facility]".   The purpose of this change is to eliminate any confusion created by the concept of our practice being a "Medication Management Pain Clinic". In the past this has led to the misconception that we treat pain primarily by the use of prescription medications.  Nothing can be farther from the truth.   Understanding PAIN MANAGEMENT: To further understand what our practice does, you first have to understand that "Pain Management" is a subspecialty that requires additional training once a physician has completed their specialty training, which can be in either Anesthesia, Neurology, Psychiatry, or Physical Medicine and Rehabilitation (PMR). Each one of these contributes to the final approach taken by each physician to the management of their patient's pain. To be a "Pain Management Specialist" you must have first completed one of the specialty trainings below.  Anesthesiologists -  trained in clinical pharmacology and interventional techniques such as nerve  blockade and regional as well as central neuroanatomy. They are trained to block pain before, during, and after surgical interventions.  Neurologists - trained in the diagnosis and pharmacological treatment of complex neurological conditions, such as Multiple Sclerosis, Parkinson's, spinal cord injuries, and other systemic conditions that may be associated with symptoms that may include but are not limited to pain. They tend to rely primarily on the treatment of chronic pain using prescription medications.  Psychiatrist - trained in conditions affecting the psychosocial wellbeing of patients including but not limited to depression, anxiety, schizophrenia, personality disorders, addiction, and other substance use disorders that may be associated with chronic pain. They tend to rely primarily on the treatment of chronic pain using prescription medications.   Physical Medicine and Rehabilitation (PMR) physicians, also known as physiatrists - trained to treat a wide variety of medical conditions affecting the brain, spinal cord, nerves, bones, joints, ligaments, muscles, and tendons. Their training is primarily aimed at treating patients that have suffered injuries that have caused severe physical impairment. Their training is primarily aimed at the physical therapy and rehabilitation of those patients. They may also work alongside orthopedic surgeons or neurosurgeons using their expertise in assisting surgical patients to recover after their surgeries.  INTERVENTIONAL PAIN MANAGEMENT is sub-subspecialty of Pain Management.  Our physicians are Board-certified in Anesthesia, Pain Management, and Interventional Pain Management.  This meaning that not only have they been trained and Board-certified in their specialty of Anesthesia, and subspecialty of Pain Management, but they have also received further training in the sub-subspecialty of Interventional Pain Management, in order to become Board-certified as  INTERVENTIONAL PAIN MANAGEMENT SPECIALIST.    Mission: Our goal is to use our skills in  [place] as alternatives to the chronic use of prescription opioid medications for the treatment of pain. To make this more clear, we have changed our name to reflect what we do and offer. We will continue to offer medication management assessment and recommendations, but we will not be taking over any patient's medication management.  ____________________________________________________________________________________________     ____________________________________________________________________________________________  Patient Information update  To: All of our patients.  Re: Name change.  It has been made official that our current name, "[facility]"   will soon be changed to "[facility]".   The purpose of this change is to eliminate any confusion created by the concept of our practice being a "Medication Management Pain Clinic". In the past this has led to the misconception that we treat pain primarily by the use of prescription medications.  Nothing can be farther from the truth.   Understanding PAIN MANAGEMENT: To further understand what our practice does, you first have to understand that "Pain Management" is a subspecialty that requires additional training once a physician has completed their specialty training, which can be in either Anesthesia, Neurology, Psychiatry, or Physical Medicine and Rehabilitation (PMR). Each one of these contributes to the final approach taken by each physician to the management of their patient's pain. To be a "Pain Management Specialist" you must have first completed one of the specialty trainings below.  Anesthesiologists - trained in clinical pharmacology and interventional techniques such as nerve blockade and regional as well as  central neuroanatomy. They are trained to block pain before, during, and after surgical interventions.  Neurologists - trained in the diagnosis and pharmacological treatment of complex neurological  conditions, such as Multiple Sclerosis, Parkinson's, spinal cord injuries, and other systemic conditions that may be associated with symptoms that may include but are not limited to pain. They tend to rely primarily on the treatment of chronic pain using prescription medications.  Psychiatrist - trained in conditions affecting the psychosocial wellbeing of patients including but not limited to depression, anxiety, schizophrenia, personality disorders, addiction, and other substance use disorders that may be associated with chronic pain. They tend to rely primarily on the treatment of chronic pain using prescription medications.   Physical Medicine and Rehabilitation (PMR) physicians, also known as physiatrists - trained to treat a wide variety of medical conditions affecting the brain, spinal cord, nerves, bones, joints, ligaments, muscles, and tendons. Their training is primarily aimed at treating patients that have suffered injuries that have caused severe physical impairment. Their training is primarily aimed at the physical therapy and rehabilitation of those patients. They may also work alongside orthopedic surgeons or neurosurgeons using their expertise in assisting surgical patients to recover after their surgeries.  INTERVENTIONAL PAIN MANAGEMENT is sub-subspecialty of Pain Management.  Our physicians are Board-certified in Anesthesia, Pain Management, and Interventional Pain Management.  This meaning that not only have they been trained and Board-certified in their specialty of Anesthesia, and subspecialty of Pain Management, but they have also received further training in the sub-subspecialty of Interventional Pain Management, in order to become Board-certified as INTERVENTIONAL PAIN MANAGEMENT  SPECIALIST.    Mission: Our goal is to use our skills in  [place] as alternatives to the chronic use of prescription opioid medications for the treatment of pain. To make this more clear, we have changed our name to reflect what we do and offer. We will continue to offer medication management assessment and recommendations, but we will not be taking over any patient's medication management.  ____________________________________________________________________________________________

## 2022-09-22 ENCOUNTER — Ambulatory Visit: Payer: Medicare PPO | Attending: Pain Medicine | Admitting: Pain Medicine

## 2022-09-22 ENCOUNTER — Encounter: Payer: Self-pay | Admitting: Pain Medicine

## 2022-09-22 VITALS — BP 143/75 | HR 77 | Temp 97.0°F | Resp 18 | Ht 64.0 in | Wt 245.0 lb

## 2022-09-22 DIAGNOSIS — M25512 Pain in left shoulder: Secondary | ICD-10-CM | POA: Diagnosis not present

## 2022-09-22 DIAGNOSIS — G894 Chronic pain syndrome: Secondary | ICD-10-CM | POA: Insufficient documentation

## 2022-09-22 DIAGNOSIS — G8929 Other chronic pain: Secondary | ICD-10-CM | POA: Insufficient documentation

## 2022-09-22 DIAGNOSIS — R0781 Pleurodynia: Secondary | ICD-10-CM | POA: Insufficient documentation

## 2022-09-22 DIAGNOSIS — M25511 Pain in right shoulder: Secondary | ICD-10-CM | POA: Insufficient documentation

## 2022-09-22 DIAGNOSIS — Z79891 Long term (current) use of opiate analgesic: Secondary | ICD-10-CM | POA: Diagnosis not present

## 2022-09-22 DIAGNOSIS — G4486 Cervicogenic headache: Secondary | ICD-10-CM

## 2022-09-22 DIAGNOSIS — M542 Cervicalgia: Secondary | ICD-10-CM | POA: Insufficient documentation

## 2022-09-22 DIAGNOSIS — Z79899 Other long term (current) drug therapy: Secondary | ICD-10-CM | POA: Insufficient documentation

## 2022-09-22 MED ORDER — TRAMADOL HCL 50 MG PO TABS: 100.0000 mg | ORAL_TABLET | Freq: Four times a day (QID) | ORAL | 5 refills | Status: DC

## 2022-09-22 NOTE — Progress Notes (Signed)
Safety precautions to be maintained throughout the outpatient stay will include: orient to surroundings, keep bed in low position, maintain call bell within reach at all times, provide assistance with transfer out of bed and ambulation.   Nursing Pain Medication Assessment:  Safety precautions to be maintained throughout the outpatient stay will include: orient to surroundings, keep bed in low position, maintain call bell within reach at all times, provide assistance with transfer out of bed and ambulation.  Medication Inspection Compliance: Pill count conducted under aseptic conditions, in front of the patient. Neither the pills nor the bottle was removed from the patient's sight at any time. Once count was completed pills were immediately returned to the patient in their original bottle.  Medication: Tramadol (Ultram) Pill/Patch Count:  240 of 240 pills remain Pill/Patch Appearance: Markings consistent with prescribed medication Bottle Appearance: Standard pharmacy container. Clearly labeled. Filled Date: 01 / 30 / 2024 Last Medication intake:  Today  (Has ~10 pills in previous Tramadol Bottle at home).

## 2022-09-24 ENCOUNTER — Other Ambulatory Visit: Payer: Self-pay | Admitting: Internal Medicine

## 2022-09-24 DIAGNOSIS — J449 Chronic obstructive pulmonary disease, unspecified: Secondary | ICD-10-CM | POA: Diagnosis not present

## 2022-09-30 ENCOUNTER — Ambulatory Visit (INDEPENDENT_AMBULATORY_CARE_PROVIDER_SITE_OTHER): Payer: Medicare PPO | Admitting: Internal Medicine

## 2022-09-30 ENCOUNTER — Encounter: Payer: Self-pay | Admitting: Internal Medicine

## 2022-09-30 VITALS — BP 139/79 | HR 73 | Temp 98.3°F | Resp 16 | Ht 64.0 in | Wt 243.4 lb

## 2022-09-30 DIAGNOSIS — I10 Essential (primary) hypertension: Secondary | ICD-10-CM

## 2022-09-30 DIAGNOSIS — M5442 Lumbago with sciatica, left side: Secondary | ICD-10-CM | POA: Diagnosis not present

## 2022-09-30 DIAGNOSIS — M5441 Lumbago with sciatica, right side: Secondary | ICD-10-CM

## 2022-09-30 DIAGNOSIS — J4489 Other specified chronic obstructive pulmonary disease: Secondary | ICD-10-CM | POA: Diagnosis not present

## 2022-09-30 DIAGNOSIS — J449 Chronic obstructive pulmonary disease, unspecified: Secondary | ICD-10-CM | POA: Diagnosis not present

## 2022-09-30 DIAGNOSIS — G8929 Other chronic pain: Secondary | ICD-10-CM | POA: Diagnosis not present

## 2022-09-30 DIAGNOSIS — Z6841 Body Mass Index (BMI) 40.0 and over, adult: Secondary | ICD-10-CM

## 2022-09-30 MED ORDER — TRIAMTERENE-HCTZ 37.5-25 MG PO TABS: 1.0000 | ORAL_TABLET | Freq: Every day | ORAL | 3 refills | Status: DC
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
## 2022-10-01 ENCOUNTER — Telehealth: Payer: Self-pay

## 2022-10-01 NOTE — Telephone Encounter (Signed)
[facility]axed [facility] for Duoneb

## 2022-10-10 DIAGNOSIS — G4733 Obstructive sleep apnea (adult) (pediatric): Secondary | ICD-10-CM | POA: Diagnosis not present

## 2022-10-29 DIAGNOSIS — J449 Chronic obstructive pulmonary disease, unspecified: Secondary | ICD-10-CM | POA: Diagnosis not present

## 2022-10-31 ENCOUNTER — Telehealth: Payer: Self-pay | Admitting: Internal Medicine

## 2022-10-31 NOTE — Telephone Encounter (Signed)
Lvm to r/s 01/07/23 appointment-Regina Campos

## 2022-11-04 ENCOUNTER — Encounter: Payer: Self-pay | Admitting: Internal Medicine

## 2022-11-04 ENCOUNTER — Ambulatory Visit (INDEPENDENT_AMBULATORY_CARE_PROVIDER_SITE_OTHER): Payer: Medicare PPO | Admitting: Internal Medicine

## 2022-11-04 VITALS — BP 130/64 | HR 66 | Temp 97.9°F | Resp 16 | Ht 64.0 in | Wt 246.0 lb

## 2022-11-04 DIAGNOSIS — Z1231 Encounter for screening mammogram for malignant neoplasm of breast: Secondary | ICD-10-CM

## 2022-11-04 DIAGNOSIS — Z0001 Encounter for general adult medical examination with abnormal findings: Secondary | ICD-10-CM | POA: Diagnosis not present

## 2022-11-04 DIAGNOSIS — E782 Mixed hyperlipidemia: Secondary | ICD-10-CM | POA: Diagnosis not present

## 2022-11-04 DIAGNOSIS — R42 Dizziness and giddiness: Secondary | ICD-10-CM

## 2022-11-04 DIAGNOSIS — E038 Other specified hypothyroidism: Secondary | ICD-10-CM | POA: Diagnosis not present

## 2022-11-04 DIAGNOSIS — R300 Dysuria: Secondary | ICD-10-CM | POA: Diagnosis not present
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
## 2022-11-05 LAB — UA/M W/RFLX CULTURE, ROUTINE
Bilirubin, UA: NEGATIVE
Glucose, UA: NEGATIVE
Ketones, UA: NEGATIVE
Leukocytes,UA: NEGATIVE
Nitrite, UA: NEGATIVE
Protein,UA: NEGATIVE
RBC, UA: NEGATIVE
Specific Gravity, UA: 1.014 (ref 1.005–1.030)
Urobilinogen, Ur: 0.2 mg/dL (ref 0.2–1.0)
pH, UA: 6.5 (ref 5.0–7.5)

## 2022-11-05 LAB — MICROSCOPIC EXAMINATION
Casts: NONE SEEN /lpf
Epithelial Cells (non renal): >10 /hpf — AB (ref 0–10)
RBC, Urine: NONE SEEN /hpf (ref 0–2)

## 2022-11-17 ENCOUNTER — Other Ambulatory Visit: Payer: Self-pay

## 2022-11-17 DIAGNOSIS — G8929 Other chronic pain: Secondary | ICD-10-CM

## 2022-11-17 MED ORDER — CELECOXIB 100 MG PO CAPS: ORAL_CAPSULE | ORAL | 1 refills | Status: DC

## 2022-11-24 ENCOUNTER — Encounter: Payer: Self-pay | Admitting: *Deleted

## 2022-11-27 ENCOUNTER — Encounter: Payer: Self-pay | Admitting: *Deleted

## 2022-11-28 ENCOUNTER — Encounter: Payer: Self-pay | Admitting: *Deleted

## 2022-11-28 ENCOUNTER — Encounter
Admission: RE | Disposition: A | Discharge status: Home / Self Care | Payer: Self-pay | Source: Home / Self Care | Attending: Gastroenterology

## 2022-11-28 ENCOUNTER — Ambulatory Visit
Admission: RE | Admit: 2022-11-28 | Discharge: 2022-11-28 | Disposition: A | Discharge status: Home / Self Care | Payer: Medicare PPO | Attending: Gastroenterology | Admitting: *Deleted

## 2022-11-28 ENCOUNTER — Ambulatory Visit: Payer: Medicare PPO | Admitting: Registered Nurse

## 2022-11-28 DIAGNOSIS — Z8673 Personal history of transient ischemic attack (TIA), and cerebral infarction without residual deficits: Secondary | ICD-10-CM | POA: Insufficient documentation

## 2022-11-28 DIAGNOSIS — Z96653 Presence of artificial knee joint, bilateral: Secondary | ICD-10-CM | POA: Insufficient documentation

## 2022-11-28 DIAGNOSIS — Z96619 Presence of unspecified artificial shoulder joint: Secondary | ICD-10-CM | POA: Diagnosis not present

## 2022-11-28 DIAGNOSIS — G4730 Sleep apnea, unspecified: Secondary | ICD-10-CM | POA: Insufficient documentation

## 2022-11-28 DIAGNOSIS — K635 Polyp of colon: Secondary | ICD-10-CM | POA: Diagnosis not present

## 2022-11-28 DIAGNOSIS — E785 Hyperlipidemia, unspecified: Secondary | ICD-10-CM | POA: Diagnosis not present

## 2022-11-28 DIAGNOSIS — Z9981 Dependence on supplemental oxygen: Secondary | ICD-10-CM | POA: Insufficient documentation

## 2022-11-28 DIAGNOSIS — F32A Depression, unspecified: Secondary | ICD-10-CM | POA: Insufficient documentation

## 2022-11-28 DIAGNOSIS — D122 Benign neoplasm of ascending colon: Secondary | ICD-10-CM | POA: Insufficient documentation

## 2022-11-28 DIAGNOSIS — J449 Chronic obstructive pulmonary disease, unspecified: Secondary | ICD-10-CM | POA: Diagnosis not present

## 2022-11-28 DIAGNOSIS — K5730 Diverticulosis of large intestine without perforation or abscess without bleeding: Secondary | ICD-10-CM | POA: Insufficient documentation

## 2022-11-28 DIAGNOSIS — Z1211 Encounter for screening for malignant neoplasm of colon: Secondary | ICD-10-CM | POA: Diagnosis not present

## 2022-11-28 DIAGNOSIS — E039 Hypothyroidism, unspecified: Secondary | ICD-10-CM | POA: Diagnosis not present

## 2022-11-28 DIAGNOSIS — Z86010 Personal history of colonic polyps: Secondary | ICD-10-CM | POA: Diagnosis not present

## 2022-11-28 DIAGNOSIS — D123 Benign neoplasm of transverse colon: Secondary | ICD-10-CM | POA: Diagnosis not present

## 2022-11-28 DIAGNOSIS — Z9049 Acquired absence of other specified parts of digestive tract: Secondary | ICD-10-CM | POA: Insufficient documentation

## 2022-11-28 DIAGNOSIS — Z09 Encounter for follow-up examination after completed treatment for conditions other than malignant neoplasm: Secondary | ICD-10-CM | POA: Diagnosis present

## 2022-11-28 DIAGNOSIS — K648 Other hemorrhoids: Secondary | ICD-10-CM | POA: Diagnosis not present

## 2022-11-28 DIAGNOSIS — K640 First degree hemorrhoids: Secondary | ICD-10-CM | POA: Diagnosis not present

## 2022-11-28 HISTORY — PX: COLONOSCOPY WITH PROPOFOL: SHX5780

## 2022-11-28 SURGERY — COLONOSCOPY WITH PROPOFOL
Anesthesia: General

## 2022-11-28 MED ORDER — LIDOCAINE HCL (CARDIAC) PF 100 MG/5ML IV SOSY
PREFILLED_SYRINGE | INTRAVENOUS | Status: DC | PRN
  Administered 2022-11-28: 100 mg via INTRAVENOUS

## 2022-11-28 MED ORDER — PROPOFOL 10 MG/ML IV BOLUS
INTRAVENOUS | Status: DC | PRN
  Administered 2022-11-28: 110 mg via INTRAVENOUS

## 2022-11-28 MED ORDER — PROPOFOL 500 MG/50ML IV EMUL
INTRAVENOUS | Status: DC | PRN
  Administered 2022-11-28: 192.484 ug/kg/min via INTRAVENOUS

## 2022-11-28 MED ORDER — SODIUM CHLORIDE 0.9 % IV SOLN: INTRAVENOUS | Status: DC
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
## 2022-11-28 NOTE — Interval H&P Note (Signed)
History and Physical Interval Note:  11/28/2022 12:42 PM  Regina Campos  has presented today for surgery, with the diagnosis of HX OF ADENOMATOUS POLYP OF COLON.  The various methods of treatment have been discussed with the patient and family. After consideration of risks, benefits and other options for treatment, the patient has consented to  Procedure(s): COLONOSCOPY WITH PROPOFOL (N/A) as a surgical intervention.  The patient's history has been reviewed, patient examined, no change in status, stable for surgery.  I have reviewed the patient's chart and labs.  Questions were answered to the patient's satisfaction.     Regis Bill  Ok to proceed with colonoscopy

## 2022-11-28 NOTE — Transfer of Care (Signed)
Immediate Anesthesia Transfer of Care Note  Patient: Regina Campos  Procedure(s) Performed: COLONOSCOPY WITH PROPOFOL  Patient Location: Endoscopy Unit  Anesthesia Type:General  Level of Consciousness: drowsy  Airway & Oxygen Therapy: Patient Spontanous Breathing  Post-op Assessment: Report given to RN and Post -op Vital signs reviewed and stable  Post vital signs: Reviewed and stable  Last Vitals:  Vitals Value Taken Time  BP 115/64 11/28/22 1318  Temp 37 C 11/28/22 1318  Pulse 60 11/28/22 1318  Resp 18 11/28/22 1318  SpO2 98 % 11/28/22 1318  Vitals shown include unvalidated device data.  Last Pain:  Vitals:   11/28/22 1318  TempSrc: Temporal  PainSc: Asleep         Complications: No notable events documented.

## 2022-11-28 NOTE — Anesthesia Postprocedure Evaluation (Signed)
Anesthesia Post Note  Patient: Regina Campos  Procedure(s) Performed: COLONOSCOPY WITH PROPOFOL  Patient location during evaluation: Endoscopy Anesthesia Type: General Level of consciousness: awake and alert Pain management: pain level controlled Vital Signs Assessment: post-procedure vital signs reviewed and stable Respiratory status: spontaneous breathing, nonlabored ventilation, respiratory function stable and patient connected to nasal cannula oxygen Cardiovascular status: blood pressure returned to baseline and stable Postop Assessment: no apparent nausea or vomiting Anesthetic complications: no   No notable events documented.   Last Vitals:  Vitals:   11/28/22 1318 11/28/22 1338  BP: 115/64 107/75  Pulse: 75   Resp:    Temp: 37 C   SpO2: 99%     Last Pain:  Vitals:   11/28/22 1338  TempSrc:   PainSc: 0-No pain                 Cleda Mccreedy Danely Bayliss

## 2022-11-28 NOTE — Op Note (Signed)
[facility] Gastroenterology Patient Name: Regina Campos Procedure Date: 11/28/2022 12:45 PM MRN: [id number] Account #: [id number] Date of Birth: 1951/03/31 Admit Type: Outpatient Age: 72 Room: [facility] ENDO ROOM 3 Gender: Female Note Status: Finalized Instrument Name: Prentice Docker [id number] Procedure:             Colonoscopy Indications:           Surveillance: Personal history of adenomatous polyps,                         inadequate prep on last colonoscopy (less than 1 year                         ago) Providers:             Eather Colas MD, MD Referring MD:          Lyndon Code, MD (Referring MD) Medicines:             Monitored Anesthesia Care Complications:         No immediate complications. Estimated blood loss:                         Minimal. Procedure:             Pre-Anesthesia Assessment:                        - Prior to the procedure, a History and Physical was                         performed, and patient medications and allergies were                         reviewed. The patient is competent. The risks and                         benefits of the procedure and the sedation options and                         risks were discussed with the patient. All questions                         were answered and informed consent was obtained.                         Patient identification and proposed procedure were                         verified by the physician, the nurse, the                         anesthesiologist, the anesthetist and the technician                         in the endoscopy suite. Mental Status Examination:                         alert and oriented. Airway Examination: normal  oropharyngeal airway and neck mobility. Respiratory                         Examination: clear to auscultation. CV Examination:                         normal. Prophylactic Antibiotics: The patient does not                          require prophylactic antibiotics. Prior                         Anticoagulants: The patient has taken no anticoagulant                         or antiplatelet agents. ASA Grade Assessment: III - A                         patient with severe systemic disease. After reviewing                         the risks and benefits, the patient was deemed in                         satisfactory condition to undergo the procedure. The                         anesthesia plan was to use monitored anesthesia care                         (MAC). Immediately prior to administration of                         medications, the patient was re-assessed for adequacy                         to receive sedatives. The heart rate, respiratory                         rate, oxygen saturations, blood pressure, adequacy of                         pulmonary ventilation, and response to care were                         monitored throughout the procedure. The physical                         status of the patient was re-assessed after the                         procedure.                        After obtaining informed consent, the colonoscope was                         passed under direct vision. Throughout the procedure,  the patient's blood pressure, pulse, and oxygen                         saturations were monitored continuously. The                         Colonoscope was introduced through the anus and                         advanced to the the cecum, identified by appendiceal                         orifice and ileocecal valve. The colonoscopy was                         somewhat difficult due to significant looping. The                         patient tolerated the procedure well. The quality of                         the bowel preparation was adequate to identify polyps.                         The ileocecal valve, appendiceal orifice, and rectum                         were  photographed. Findings:      The perianal and digital rectal examinations were normal.      A 1 mm polyp was found in the ascending colon. The polyp was sessile.       The polyp was removed with a jumbo cold forceps. Resection and retrieval       were complete. Estimated blood loss was minimal.      A 3 mm polyp was found in the proximal transverse colon. The polyp was       sessile. The polyp was removed with a cold snare. Resection and       retrieval were complete. Estimated blood loss was minimal.      Multiple large-mouthed and small-mouthed diverticula were found in the       sigmoid colon, descending colon and transverse colon.      Internal hemorrhoids were found during retroflexion. The hemorrhoids       were Grade I (internal hemorrhoids that do not prolapse).      The exam was otherwise without abnormality on direct and retroflexion       views. Impression:            - One 1 mm polyp in the ascending colon, removed with                         a jumbo cold forceps. Resected and retrieved.                        - One 3 mm polyp in the proximal transverse colon,                         removed with a cold snare. Resected and retrieved.                        -  Diverticulosis in the sigmoid colon, in the                         descending colon and in the transverse colon.                        - Internal hemorrhoids.                        - The examination was otherwise normal on direct and                         retroflexion views. Recommendation:        - Discharge patient to home.                        - Resume previous diet.                        - Continue present medications.                        - Await pathology results.                        - Repeat colonoscopy in 2 years for surveillance.                        - Return to referring physician as previously                         scheduled. Procedure Code(s):     --- Professional ---                         [phone], Colonoscopy, flexible; with removal of                         tumor(s), polyp(s), or other lesion(s) by snare                         technique                        45380, 59, Colonoscopy, flexible; with biopsy, single                         or multiple Diagnosis Code(s):     --- Professional ---                        Z86.010, Personal history of colonic polyps                        D12.2, Benign neoplasm of ascending colon                        D12.3, Benign neoplasm of transverse colon (hepatic                         flexure or splenic flexure)  K64.0, First degree hemorrhoids                        K57.30, Diverticulosis of large intestine without                         perforation or abscess without bleeding CPT copyright 2022 American Medical Association. All rights reserved. The codes documented in this report are preliminary and upon coder review may  be revised to meet current compliance requirements. Eather Colas MD, MD 11/28/2022 1:19:24 PM Number of Addenda: 0 Note Initiated On: 11/28/2022 12:45 PM Scope Withdrawal Time: 0 hours 14 minutes 41 seconds  Total Procedure Duration: 0 hours 23 minutes 56 seconds  Estimated Blood Loss:  Estimated blood loss was minimal.      [facility]

## 2022-11-29 DIAGNOSIS — J449 Chronic obstructive pulmonary disease, unspecified: Secondary | ICD-10-CM | POA: Diagnosis not present

## 2022-11-30 ENCOUNTER — Encounter (INDEPENDENT_AMBULATORY_CARE_PROVIDER_SITE_OTHER): Payer: Self-pay

## 2022-12-01 LAB — SURGICAL PATHOLOGY

## 2022-12-02 ENCOUNTER — Encounter: Payer: Self-pay | Admitting: Gastroenterology

## 2022-12-29 DIAGNOSIS — J449 Chronic obstructive pulmonary disease, unspecified: Secondary | ICD-10-CM | POA: Diagnosis not present

## 2022-12-30 ENCOUNTER — Other Ambulatory Visit: Payer: Self-pay | Admitting: Internal Medicine

## 2022-12-31 ENCOUNTER — Other Ambulatory Visit: Payer: Self-pay

## 2022-12-31 DIAGNOSIS — G4733 Obstructive sleep apnea (adult) (pediatric): Secondary | ICD-10-CM | POA: Diagnosis not present

## 2022-12-31 DIAGNOSIS — R0602 Shortness of breath: Secondary | ICD-10-CM

## 2023-01-06 ENCOUNTER — Ambulatory Visit: Payer: Medicare PPO | Admitting: Internal Medicine

## 2023-01-06 ENCOUNTER — Encounter: Payer: Self-pay | Admitting: Internal Medicine

## 2023-01-06 VITALS — BP 135/78 | HR 74 | Temp 97.8°F | Resp 16 | Ht 64.0 in | Wt 240.0 lb

## 2023-01-06 DIAGNOSIS — G8929 Other chronic pain: Secondary | ICD-10-CM | POA: Diagnosis not present

## 2023-01-06 DIAGNOSIS — M545 Low back pain, unspecified: Secondary | ICD-10-CM | POA: Diagnosis not present

## 2023-01-06 DIAGNOSIS — I10 Essential (primary) hypertension: Secondary | ICD-10-CM | POA: Diagnosis not present

## 2023-01-06 DIAGNOSIS — E039 Hypothyroidism, unspecified: Secondary | ICD-10-CM | POA: Diagnosis not present

## 2023-01-06 MED ORDER — GABAPENTIN 100 MG PO CAPS
100.0000 mg | ORAL_CAPSULE | Freq: Three times a day (TID) | ORAL | 3 refills | Status: DC
Start: 2023-01-06 — End: 2023-04-14
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
## 2023-01-07 ENCOUNTER — Ambulatory Visit: Admitting: Internal Medicine

## 2023-01-07 LAB — TSH+FREE T4
Free T4: 1.21 ng/dL (ref 0.82–1.77)
TSH: 4.21 u[IU]/mL (ref 0.450–4.500)

## 2023-01-09 ENCOUNTER — Telehealth: Payer: Self-pay

## 2023-01-09 ENCOUNTER — Other Ambulatory Visit: Payer: Self-pay

## 2023-01-09 ENCOUNTER — Telehealth: Payer: Self-pay | Admitting: Internal Medicine

## 2023-01-09 MED ORDER — LEVOTHYROXINE SODIUM 125 MCG PO TABS: 125.0000 ug | ORAL_TABLET | Freq: Every day | ORAL | 0 refills | Status: DC

## 2023-01-09 NOTE — Telephone Encounter (Signed)
-----   Message from Lyndon Code, MD sent at 01/08/2023  9:29 PM EDT ----- Can we increase her synthroid to 125 mcg once a day please , DC synthroid 100

## 2023-01-09 NOTE — Telephone Encounter (Signed)
Patient notified

## 2023-01-09 NOTE — Telephone Encounter (Signed)
Lmom to call us back 

## 2023-01-09 NOTE — Telephone Encounter (Signed)
Left vm and sent mychart message to confirm 01/14/23 appointment-Toni 

## 2023-01-14 ENCOUNTER — Ambulatory Visit: Admitting: Internal Medicine

## 2023-01-20 DIAGNOSIS — J449 Chronic obstructive pulmonary disease, unspecified: Secondary | ICD-10-CM | POA: Diagnosis not present

## 2023-01-26 ENCOUNTER — Ambulatory Visit: Admitting: Internal Medicine

## 2023-01-28 ENCOUNTER — Ambulatory Visit: Admitting: Internal Medicine

## 2023-01-29 ENCOUNTER — Ambulatory Visit (INDEPENDENT_AMBULATORY_CARE_PROVIDER_SITE_OTHER)

## 2023-01-29 ENCOUNTER — Ambulatory Visit
Admission: EM | Admit: 2023-01-29 | Discharge: 2023-01-29 | Disposition: A | Discharge status: Home / Self Care | Attending: Nurse Practitioner | Admitting: Nurse Practitioner

## 2023-01-29 DIAGNOSIS — M19071 Primary osteoarthritis, right ankle and foot: Secondary | ICD-10-CM | POA: Diagnosis not present

## 2023-01-29 DIAGNOSIS — M79671 Pain in right foot: Secondary | ICD-10-CM | POA: Diagnosis not present

## 2023-01-29 DIAGNOSIS — J449 Chronic obstructive pulmonary disease, unspecified: Secondary | ICD-10-CM | POA: Diagnosis not present

## 2023-01-29 MED ORDER — PREDNISONE 10 MG (21) PO TBPK
ORAL_TABLET | Freq: Every day | ORAL | 0 refills | Status: DC
Start: 2023-01-29 — End: 2023-04-14

## 2023-01-29 NOTE — Discharge Instructions (Signed)
Your x-ray was negative for fracture but did show significant arthritis.  Start prednisone taper as prescribed Do Epsom salt soaks as well elevate and rest as needed Follow-up with your PCP if your symptoms do not improve Please go to the emergency room if you develop any worsening symptoms

## 2023-01-29 NOTE — ED Triage Notes (Signed)
Pt presents with pain to her right ankle, that started 3 weeks ago. Pt thinks that she hit a dog bone while walking at home and may have hurt her ankle. Pt would like a x-ray, states does have hx of ankle fracture on right foot.
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
## 2023-02-03 ENCOUNTER — Telehealth: Payer: Self-pay | Admitting: Internal Medicine

## 2023-02-03 NOTE — Telephone Encounter (Signed)
Left vm and sent mychart message to confirm 02/11/23 appointment-Toni 

## 2023-02-10 ENCOUNTER — Ambulatory Visit: Admitting: Internal Medicine

## 2023-02-11 ENCOUNTER — Ambulatory Visit (INDEPENDENT_AMBULATORY_CARE_PROVIDER_SITE_OTHER): Payer: Medicare PPO | Admitting: Internal Medicine

## 2023-02-11 ENCOUNTER — Telehealth: Payer: Self-pay | Admitting: Internal Medicine

## 2023-02-11 DIAGNOSIS — R0602 Shortness of breath: Secondary | ICD-10-CM

## 2023-02-11 NOTE — Telephone Encounter (Signed)
Lvm to come in @ 11:30 for today's pft-Toni

## 2023-02-12 NOTE — Procedures (Signed)
[facility] [address] [place], [address]    Complete Pulmonary Function Testing Interpretation:  FINDINGS:   Forced vital capacity is normal FEV1 is normal FEV1 FVC ratio is normal postbronchodilator no significant improvement in the FEV1 is noted.  Total lung capacity is mildly decreased residual volume decreased residual volume total lung capacity ratio is decreased.  FRC is decreased.  The DLCO is normal  IMPRESSION:  This pulmonary function study is within mild restrictive lung disease  Yevonne Pax, MD [facility] Pulmonary Critical Care Medicine Sleep Medicine

## 2023-02-16 LAB — PULMONARY FUNCTION TEST

## 2023-02-24 ENCOUNTER — Ambulatory Visit: Payer: Medicare PPO | Admitting: Internal Medicine

## 2023-02-24 ENCOUNTER — Encounter: Payer: Self-pay | Admitting: Internal Medicine

## 2023-02-24 ENCOUNTER — Ambulatory Visit: Admitting: Internal Medicine

## 2023-02-24 VITALS — BP 130/80 | HR 77 | Temp 97.8°F | Resp 16 | Ht 65.0 in | Wt 223.0 lb

## 2023-02-24 DIAGNOSIS — Z7189 Other specified counseling: Secondary | ICD-10-CM

## 2023-02-24 DIAGNOSIS — G4733 Obstructive sleep apnea (adult) (pediatric): Secondary | ICD-10-CM | POA: Diagnosis not present
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
## 2023-02-28 DIAGNOSIS — J449 Chronic obstructive pulmonary disease, unspecified: Secondary | ICD-10-CM | POA: Diagnosis not present

## 2023-03-03 ENCOUNTER — Encounter: Payer: Self-pay | Admitting: Internal Medicine

## 2023-03-03 ENCOUNTER — Ambulatory Visit (INDEPENDENT_AMBULATORY_CARE_PROVIDER_SITE_OTHER): Payer: Medicare PPO | Admitting: Internal Medicine

## 2023-03-03 VITALS — BP 141/81 | HR 68 | Temp 98.5°F | Resp 16 | Ht 64.0 in | Wt 244.0 lb

## 2023-03-03 DIAGNOSIS — Z6841 Body Mass Index (BMI) 40.0 and over, adult: Secondary | ICD-10-CM

## 2023-03-03 DIAGNOSIS — E039 Hypothyroidism, unspecified: Secondary | ICD-10-CM | POA: Diagnosis not present

## 2023-03-03 DIAGNOSIS — I10 Essential (primary) hypertension: Secondary | ICD-10-CM

## 2023-03-03 DIAGNOSIS — I700 Atherosclerosis of aorta: Secondary | ICD-10-CM | POA: Diagnosis not present

## 2023-03-03 DIAGNOSIS — M19071 Primary osteoarthritis, right ankle and foot: Secondary | ICD-10-CM

## 2023-03-03 MED ORDER — ROSUVASTATIN CALCIUM 5 MG PO TABS: ORAL_TABLET | ORAL | 1 refills | Status: DC

## 2023-03-03 MED ORDER — DICLOFENAC SODIUM 1 % EX GEL
2.0000 g | Freq: Three times a day (TID) | CUTANEOUS | 4 refills | Status: AC
Start: 2023-03-03 — End: ?

## 2023-03-03 MED ORDER — AMOXICILLIN-POT CLAVULANATE 875-125 MG PO TABS
1.0000 | ORAL_TABLET | Freq: Two times a day (BID) | ORAL | 0 refills | Status: DC
Start: 2023-03-03 — End: 2023-04-14
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
## 2023-03-12 ENCOUNTER — Other Ambulatory Visit: Payer: Self-pay | Admitting: Internal Medicine

## 2023-03-12 DIAGNOSIS — G8929 Other chronic pain: Secondary | ICD-10-CM
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
## 2023-03-18 ENCOUNTER — Encounter: Admitting: Pain Medicine

## 2023-03-26 ENCOUNTER — Other Ambulatory Visit: Payer: Self-pay | Admitting: Internal Medicine

## 2023-03-26 DIAGNOSIS — G8929 Other chronic pain: Secondary | ICD-10-CM

## 2023-03-31 DIAGNOSIS — J449 Chronic obstructive pulmonary disease, unspecified: Secondary | ICD-10-CM | POA: Diagnosis not present

## 2023-04-05 ENCOUNTER — Other Ambulatory Visit: Payer: Self-pay | Admitting: Internal Medicine

## 2023-04-14 ENCOUNTER — Ambulatory Visit (INDEPENDENT_AMBULATORY_CARE_PROVIDER_SITE_OTHER): Payer: Medicare PPO | Admitting: Internal Medicine

## 2023-04-14 ENCOUNTER — Encounter: Payer: Self-pay | Admitting: Internal Medicine

## 2023-04-14 VITALS — BP 145/80 | HR 82 | Temp 98.3°F | Resp 16 | Ht 64.0 in | Wt 233.0 lb

## 2023-04-14 DIAGNOSIS — M545 Low back pain, unspecified: Secondary | ICD-10-CM | POA: Diagnosis not present

## 2023-04-14 DIAGNOSIS — E782 Mixed hyperlipidemia: Secondary | ICD-10-CM

## 2023-04-14 DIAGNOSIS — E039 Hypothyroidism, unspecified: Secondary | ICD-10-CM

## 2023-04-14 DIAGNOSIS — K5901 Slow transit constipation: Secondary | ICD-10-CM

## 2023-04-14 DIAGNOSIS — M5441 Lumbago with sciatica, right side: Secondary | ICD-10-CM

## 2023-04-14 DIAGNOSIS — G4733 Obstructive sleep apnea (adult) (pediatric): Secondary | ICD-10-CM | POA: Diagnosis not present

## 2023-04-14 DIAGNOSIS — M5442 Lumbago with sciatica, left side: Secondary | ICD-10-CM | POA: Diagnosis not present

## 2023-04-14 DIAGNOSIS — I10 Essential (primary) hypertension: Secondary | ICD-10-CM

## 2023-04-14 DIAGNOSIS — J438 Other emphysema: Secondary | ICD-10-CM

## 2023-04-14 DIAGNOSIS — G8929 Other chronic pain: Secondary | ICD-10-CM

## 2023-04-14 DIAGNOSIS — Z6841 Body Mass Index (BMI) 40.0 and over, adult: Secondary | ICD-10-CM

## 2023-04-14 MED ORDER — DULOXETINE HCL 30 MG PO CPEP
30.0000 mg | ORAL_CAPSULE | Freq: Every day | ORAL | 3 refills | Status: DC
Start: 2023-04-14 — End: 2023-09-09

## 2023-04-14 MED ORDER — SYNTHROID 100 MCG PO TABS
100.0000 ug | ORAL_TABLET | Freq: Every day | ORAL | 3 refills | Status: DC
Start: 2023-04-14 — End: 2023-09-14

## 2023-04-14 MED ORDER — TIOTROPIUM BROMIDE MONOHYDRATE 18 MCG IN CAPS
18.0000 ug | ORAL_CAPSULE | Freq: Every day | RESPIRATORY_TRACT | 12 refills | Status: AC
Start: 2023-04-14 — End: ?

## 2023-04-14 MED ORDER — ROSUVASTATIN CALCIUM 5 MG PO TABS
ORAL_TABLET | ORAL | 1 refills | Status: DC
Start: 2023-04-14 — End: 2024-06-06

## 2023-04-14 MED ORDER — TRIAMTERENE-HCTZ 37.5-25 MG PO TABS: ORAL_TABLET | ORAL | 3 refills | Status: DC

## 2023-04-14 MED ORDER — ALBUTEROL SULFATE HFA 108 (90 BASE) MCG/ACT IN AERS
2.0000 | INHALATION_SPRAY | Freq: Four times a day (QID) | RESPIRATORY_TRACT | 3 refills | Status: DC | PRN
Start: 2023-04-14 — End: 2024-06-06

## 2023-04-14 MED ORDER — CELECOXIB 100 MG PO CAPS: ORAL_CAPSULE | ORAL | 1 refills | Status: DC

## 2023-04-14 MED ORDER — GABAPENTIN 100 MG PO CAPS
100.0000 mg | ORAL_CAPSULE | Freq: Three times a day (TID) | ORAL | 3 refills | Status: AC
Start: 2023-04-14 — End: ?
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
## 2023-05-01 DIAGNOSIS — J449 Chronic obstructive pulmonary disease, unspecified: Secondary | ICD-10-CM | POA: Diagnosis not present

## 2023-05-04 ENCOUNTER — Other Ambulatory Visit: Payer: Self-pay | Admitting: Pain Medicine

## 2023-05-04 DIAGNOSIS — G8929 Other chronic pain: Secondary | ICD-10-CM

## 2023-05-04 DIAGNOSIS — M542 Cervicalgia: Secondary | ICD-10-CM

## 2023-05-04 DIAGNOSIS — G4486 Cervicogenic headache: Secondary | ICD-10-CM

## 2023-05-04 DIAGNOSIS — R0781 Pleurodynia: Secondary | ICD-10-CM

## 2023-05-04 DIAGNOSIS — Z79891 Long term (current) use of opiate analgesic: Secondary | ICD-10-CM

## 2023-05-04 DIAGNOSIS — G894 Chronic pain syndrome: Secondary | ICD-10-CM

## 2023-05-04 DIAGNOSIS — Z79899 Other long term (current) drug therapy: Secondary | ICD-10-CM

## 2023-05-06 ENCOUNTER — Ambulatory Visit

## 2023-05-20 ENCOUNTER — Ambulatory Visit: Admitting: Nurse Practitioner

## 2023-05-21 ENCOUNTER — Encounter: Payer: Self-pay | Admitting: Nurse Practitioner

## 2023-05-21 ENCOUNTER — Ambulatory Visit (INDEPENDENT_AMBULATORY_CARE_PROVIDER_SITE_OTHER): Admitting: Nurse Practitioner

## 2023-05-21 VITALS — BP 138/80 | HR 70 | Temp 97.2°F | Resp 16 | Ht 64.0 in | Wt 232.6 lb

## 2023-05-21 DIAGNOSIS — Z7189 Other specified counseling: Secondary | ICD-10-CM | POA: Diagnosis not present

## 2023-05-21 DIAGNOSIS — J4489 Other specified chronic obstructive pulmonary disease: Secondary | ICD-10-CM | POA: Diagnosis not present

## 2023-05-21 DIAGNOSIS — G4733 Obstructive sleep apnea (adult) (pediatric): Secondary | ICD-10-CM
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
## 2023-05-22 ENCOUNTER — Encounter: Payer: Self-pay | Admitting: Physician Assistant

## 2023-05-22 ENCOUNTER — Ambulatory Visit (INDEPENDENT_AMBULATORY_CARE_PROVIDER_SITE_OTHER): Payer: Medicare PPO | Admitting: Physician Assistant

## 2023-05-22 VITALS — BP 130/80 | HR 69 | Temp 97.8°F | Resp 16 | Ht 65.0 in | Wt 232.0 lb

## 2023-05-22 DIAGNOSIS — Z23 Encounter for immunization: Secondary | ICD-10-CM | POA: Diagnosis not present

## 2023-05-22 DIAGNOSIS — E669 Obesity, unspecified: Secondary | ICD-10-CM | POA: Diagnosis not present

## 2023-05-22 DIAGNOSIS — Z72821 Inadequate sleep hygiene: Secondary | ICD-10-CM
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
## 2023-05-24 NOTE — Patient Instructions (Incomplete)
____________________________________________________________________________________________  Opioid Pain Medication Update  To: All patients taking opioid pain medications. (I.e.: hydrocodone, hydromorphone, oxycodone, oxymorphone, morphine, codeine, methadone, tapentadol, tramadol, buprenorphine, fentanyl, etc.)  Re: Updated review of side effects and adverse reactions of opioid analgesics, as well as new information about long term effects of this class of medications.  Direct risks of long-term opioid therapy are not limited to opioid addiction and overdose. Potential medical risks include serious fractures, breathing problems during sleep, hyperalgesia, immunosuppression, chronic constipation, bowel obstruction, myocardial infarction, and tooth decay secondary to xerostomia.  Unpredictable adverse effects that can occur even if you take your medication correctly: Cognitive impairment, respiratory depression, and death. Most people think that if they take their medication "correctly", and "as instructed", that they will be safe. Nothing could be farther from the truth. In reality, a significant amount of recorded deaths associated with the use of opioids has occurred in individuals that had taken the medication for a long time, and were taking their medication correctly. The following are examples of how this can happen: Patient taking his/her medication for a long time, as instructed, without any side effects, is given a certain antibiotic or another unrelated medication, which in turn triggers a "Drug-to-drug interaction" leading to disorientation, cognitive impairment, impaired reflexes, respiratory depression or an untoward event leading to serious bodily harm or injury, including death.  Patient taking his/her medication for a long time, as instructed, without any side effects, develops an acute impairment of liver and/or kidney function. This will lead to a rapid inability of the body to  breakdown and eliminate their pain medication, which will result in effects similar to an "overdose", but with the same medicine and dose that they had always taken. This again may lead to disorientation, cognitive impairment, impaired reflexes, respiratory depression or an untoward event leading to serious bodily harm or injury, including death.  A similar problem will occur with patients as they grow older and their liver and kidney function begins to decrease as part of the aging process.  Background information: Historically, the original case for using long-term opioid therapy to treat chronic noncancer pain was based on safety assumptions that subsequent experience has called into question. In 1996, the American Pain Society and the American Academy of Pain Medicine issued a consensus statement supporting long-term opioid therapy. This statement acknowledged the dangers of opioid prescribing but concluded that the risk for addiction was low; respiratory depression induced by opioids was short-lived, occurred mainly in opioid-naive patients, and was antagonized by pain; tolerance was not a common problem; and efforts to control diversion should not constrain opioid prescribing. This has now proven to be wrong. Experience regarding the risks for opioid addiction, misuse, and overdose in community practice has failed to support these assumptions.  According to the Centers for Disease Control and Prevention, fatal overdoses involving opioid analgesics have increased sharply over the past decade. Currently, more than 96,700 people die from drug overdoses every year. Opioids are a factor in 7 out of every 10 overdose deaths. Deaths from drug overdose have surpassed motor vehicle accidents as the leading cause of death for individuals between the ages of 80 and 61.  Clinical data suggest that neuroendocrine dysfunction may be very common in both men and women, potentially causing hypogonadism, erectile  dysfunction, infertility, decreased libido, osteoporosis, and depression. Recent studies linked higher opioid dose to increased opioid-related mortality. Controlled observational studies reported that long-term opioid therapy may be associated with increased risk for cardiovascular events. Subsequent meta-analysis concluded  that the safety of long-term opioid therapy in elderly patients has not been proven.   Side Effects and adverse reactions: Common side effects: Drowsiness (sedation). Dizziness. Nausea and vomiting. Constipation. Physical dependence -- Dependence often manifests with withdrawal symptoms when opioids are discontinued or decreased. Tolerance -- As you take repeated doses of opioids, you require increased medication to experience the same effect of pain relief. Respiratory depression -- This can occur in healthy people, especially with higher doses. However, people with COPD, asthma or other lung conditions may be even more susceptible to fatal respiratory impairment.  Uncommon side effects: An increased sensitivity to feeling pain and extreme response to pain (hyperalgesia). Chronic use of opioids can lead to this. Delayed gastric emptying (the process by which the contents of your stomach are moved into your small intestine). Muscle rigidity. Immune system and hormonal dysfunction. Quick, involuntary muscle jerks (myoclonus). Arrhythmia. Itchy skin (pruritus). Dry mouth (xerostomia).  Long-term side effects: Chronic constipation. Sleep-disordered breathing (SDB). Increased risk of bone fractures. Hypothalamic-pituitary-adrenal dysregulation. Increased risk of overdose.  RISKS: Respiratory depression and death: Opioids increase the risk of respiratory depression and death.  Drug-to-drug interactions: Opioids are relatively contraindicated in combination with benzodiazepines, sleep inducers, and other central nervous system depressants. Other classes of medications  (i.e.: certain antibiotics and even over-the-counter medications) may also trigger or induce respiratory depression in some patients.  Medical conditions: Patients with pre-existing respiratory problems are at higher risk of respiratory failure and/or depression when in combination with opioid analgesics. Opioids are relatively contraindicated in some medical conditions such as central sleep apnea.   Fractures and Falls:  Opioids increase the risk and incidence of falls. This is of particular importance in elderly patients.  Endocrine System:  Long-term administration is associated with endocrine abnormalities (endocrinopathies). (Also known as Opioid-induced Endocrinopathy) Influences on both the hypothalamic-pituitary-adrenal axis?and the hypothalamic-pituitary-gonadal axis have been demonstrated with consequent hypogonadism and adrenal insufficiency in both sexes. Hypogonadism and decreased levels of dehydroepiandrosterone sulfate have been reported in men and women. Endocrine effects include: Amenorrhoea in women (abnormal absence of menstruation) Reduced libido in both sexes Decreased sexual function Erectile dysfunction in men Hypogonadisms (decreased testicular function with shrinkage of testicles) Infertility Depression and fatigue Loss of muscle mass Anxiety Depression Immune suppression Hyperalgesia Weight gain Anemia Osteoporosis Patients (particularly women of childbearing age) should avoid opioids. There is insufficient evidence to recommend routine monitoring of asymptomatic patients taking opioids in the long-term for hormonal deficiencies.  Immune System: Human studies have demonstrated that opioids have an immunomodulating effect. These effects are mediated via opioid receptors both on immune effector cells and in the central nervous system. Opioids have been demonstrated to have adverse effects on antimicrobial response and anti-tumour surveillance. Buprenorphine has  been demonstrated to have no impact on immune function.  Opioid Induced Hyperalgesia: Human studies have demonstrated that prolonged use of opioids can lead to a state of abnormal pain sensitivity, sometimes called opioid induced hyperalgesia (OIH). Opioid induced hyperalgesia is not usually seen in the absence of tolerance to opioid analgesia. Clinically, hyperalgesia may be diagnosed if the patient on long-term opioid therapy presents with increased pain. This might be qualitatively and anatomically distinct from pain related to disease progression or to breakthrough pain resulting from development of opioid tolerance. Pain associated with hyperalgesia tends to be more diffuse than the pre-existing pain and less defined in quality. Management of opioid induced hyperalgesia requires opioid dose reduction.  Cancer: Chronic opioid therapy has been associated with an increased risk of cancer  among noncancer patients with chronic pain. This association was more evident in chronic strong opioid users. Chronic opioid consumption causes significant pathological changes in the small intestine and colon. Epidemiological studies have found that there is a link between opium dependence and initiation of gastrointestinal cancers. Cancer is the second leading cause of death after cardiovascular disease. Chronic use of opioids can cause multiple conditions such as GERD, immunosuppression and renal damage as well as carcinogenic effects, which are associated with the incidence of cancers.   Mortality: Long-term opioid use has been associated with increased mortality among patients with chronic non-cancer pain (CNCP).  Prescription of long-acting opioids for chronic noncancer pain was associated with a significantly increased risk of all-cause mortality, including deaths from causes other than overdose.  Reference: Von Korff M, Kolodny A, Deyo RA, Chou R. Long-term opioid therapy reconsidered. Ann Intern Med. 2011  Sep 6;155(5):325-8. doi: 10.7326/[id number]. PMID: 64403474; PMCID: [id number]. Randon Goldsmith, Hayward RA, Dunn KM, [place] KP. Risk of adverse events in patients prescribed long-term opioids: A cohort study in the [place] [facility] Research Datalink. Eur J Pain. 2019 May;23(5):908-922. doi: 10.1002/ejp.1357. Epub 2019 Jan 31. PMID: 43329518. Colameco S, Coren JS, Ciervo CA. Continuous opioid treatment for chronic noncancer pain: a time for moderation in prescribing. Postgrad Med. 2009 Jul;121(4):61-6. doi: 10.3810/pgm.2009.07.2032. PMID: 84166063. William Hamburger RN, [place] SD, Blazina I, Cristopher Peru, Bougatsos C, Deyo RA. The effectiveness and risks of long-term opioid therapy for chronic pain: a systematic review for a [employer] of [employer] Pathways to [employer]. Ann Intern Med. 2015 Feb 17;162(4):276-86. doi: 10.7326/M14-2559. PMID: 01601093. Caryl Bis [facility], Makuc DM. NCHS Data Brief No. 22. [place]: Centers for Disease Control and Prevention; 2009. Sep, Increase in Fatal Poisonings Involving Opioid Analgesics in the [place], 1999-2006. Song IA, Choi HR, Oh TK. Long-term opioid use and mortality in patients with chronic non-cancer pain: Ten-year follow-up study in [place] from 2010 through 2019. EClinicalMedicine. 2022 Jul 18;51:101558. doi: 10.1016/j.eclinm.2022.235573. PMID: 22025427; PMCID: [id number]. Huser, W., Schubert, T., Vogelmann, T. et al. All-cause mortality in patients with long-term opioid therapy compared with non-opioid analgesics for chronic non-cancer pain: a database study. BMC Med 18, 162 (2020). http://lester.info/ Rashidian H, Karie Kirks, Malekzadeh R, Haghdoost AA. An Ecological Study of the Association between Opiate Use and Incidence of Cancers. Addict Health. 2016 Fall;8(4):252-260. PMID: 51761607; PMCID: [id number].  Our Goal: Our goal is to control your  pain with means other than the use of opioid pain medications.  Our Recommendation: Talk to your physician about coming off of these medications. We can assist you with the tapering down and stopping these medicines. Based on the new information, even if you cannot completely stop the medication, a decrease in the dose may be associated with a lesser risk. Ask for other means of controlling the pain. Decrease or eliminate those factors that significantly contribute to your pain such as smoking, obesity, and a diet heavily tilted towards "inflammatory" nutrients.  Last Updated: 02/23/2023   ____________________________________________________________________________________________     ____________________________________________________________________________________________  National Pain Medication Shortage  The [place] is experiencing worsening drug shortages. These have had a negative widespread effect on patient care and treatment. Not expected to improve any time soon. Predicted to last past 2029.   Drug shortage list (generic names) Oxycodone IR Oxycodone/APAP Oxymorphone IR Hydromorphone Hydrocodone/APAP Morphine  Where is the problem?  Manufacturing and supply level.  Will this shortage affect you?  Only if you  take any of the above pain medications.  How? You may be unable to fill your prescription.  Your pharmacist may offer a "partial fill" of your prescription. (Warning: Do not accept partial fills.) Prescriptions partially filled cannot be transferred to another pharmacy. Read our Medication Rules and Regulation. Depending on how much medicine you are dependent on, you may experience withdrawals when unable to get the medication.  Recommendations: Consider ending your dependence on opioid pain medications. Ask your pain specialist to assist you with the process. Consider switching to a medication currently not in shortage, such as Buprenorphine. Talk to your pain  specialist about this option. Consider decreasing your pain medication requirements by managing tolerance thru "Drug Holidays". This may help minimize withdrawals, should you run out of medicine. Control your pain thru the use of non-pharmacological interventional therapies.   Your prescriber: Prescribers cannot be blamed for shortages. Medication manufacturing and supply issues cannot be fixed by the prescriber.   NOTE: The prescriber is not responsible for supplying the medication, or solving supply issues. Work with your pharmacist to solve it. The patient is responsible for the decision to take or continue taking the medication and for identifying and securing a legal supply source. By law, supplying the medication is the job and responsibility of the pharmacy. The prescriber is responsible for the evaluation, monitoring, and prescribing of these medications.   Prescribers will NOT: Re-issue prescriptions that have been partially filled. Re-issue prescriptions already sent to a pharmacy.  Re-send prescriptions to a different pharmacy because yours did not have your medication. Ask pharmacist to order more medicine or transfer the prescription to another pharmacy. (Read below.)  New 2023 regulation: "April 18, 2022 Revised Regulation Allows DEA-Registered Pharmacies to Transfer Electronic Prescriptions at a Patient's Request DEA Headquarters Division - Public Information Office Patients now have the ability to request their electronic prescription be transferred to another pharmacy without having to go back to their practitioner to initiate the request. This revised regulation went into effect on Monday, April 14, 2022.     At a patient's request, a DEA-registered retail pharmacy can now transfer an electronic prescription for a controlled substance (schedules II-V) to another DEA-registered retail pharmacy. Prior to this change, patients would have to go through their practitioner to  cancel their prescription and have it re-issued to a different pharmacy. The process was taxing and time consuming for both patients and practitioners.    The Drug Enforcement Administration [facility]) published its intent to revise the process for transferring electronic prescriptions on July 06, 2020.  The final rule was published in the federal register on March 13, 2022 and went into effect 30 days later.  Under the final rule, a prescription can only be transferred once between pharmacies, and only if allowed under existing state or other applicable law. The prescription must remain in its electronic form; may not be altered in any way; and the transfer must be communicated directly between two licensed pharmacists. It's important to note, any authorized refills transfer with the original prescription, which means the entire prescription will be filled at the same pharmacy".  Reference: [email or web address].is [facility] website announcement)  [email or web address] [employer] of Justice)   [employer] / Vol. 88, No. 143 / Thursday, March 13, 2022 / Rules and Regulations DEPARTMENT OF JUSTICE  Drug Enforcement Administration  21 CFR Part 1306  [Docket No. [id number]]  RIN [id number] Transfer of Electronic Prescriptions for Schedules II-V Controlled Substances Between Pharmacies for Initial Filling  ____________________________________________________________________________________________  ____________________________________________________________________________________________  Transfer of Pain Medication between Pharmacies  Re: 2023 DEA Clarification on existing regulation  Published on DEA Website: April 18, 2022  Title: Revised Regulation Allows DEA-Registered Pharmacies to Electrical engineer Prescriptions at a Patient's  Request DEA Headquarters Division - [employer]  "Patients now have the ability to request their electronic prescription be transferred to another pharmacy without having to go back to their practitioner to initiate the request. This revised regulation went into effect on Monday, April 14, 2022.     At a patient's request, a DEA-registered retail pharmacy can now transfer an electronic prescription for a controlled substance (schedules II-V) to another DEA-registered retail pharmacy. Prior to this change, patients would have to go through their practitioner to cancel their prescription and have it re-issued to a different pharmacy. The process was taxing and time consuming for both patients and practitioners.    The Drug Enforcement Administration [facility]) published its intent to revise the process for transferring electronic prescriptions on July 06, 2020.  The final rule was published in the federal register on March 13, 2022 and went into effect 30 days later.  Under the final rule, a prescription can only be transferred once between pharmacies, and only if allowed under existing state or other applicable law. The prescription must remain in its electronic form; may not be altered in any way; and the transfer must be communicated directly between two licensed pharmacists. It's important to note, any authorized refills transfer with the original prescription, which means the entire prescription will be filled at the same pharmacy."    REFERENCES: 1. DEA website announcement [email or web address]  2. Department of Justice website  [email or web address]  3. DEPARTMENT OF JUSTICE Drug Enforcement Administration 21 CFR Part 1306 [Docket No. [id number]] RIN [id number] "Transfer of Electronic Prescriptions for Schedules II-V Controlled Substances  Between Pharmacies for Initial Filling"  ____________________________________________________________________________________________     _______________________________________________________________________  Medication Rules  Purpose: To inform patients, and their family members, of our medication rules and regulations.  Applies to: All patients receiving prescriptions from our practice (written or electronic).  Pharmacy of record: This is the pharmacy where your electronic prescriptions will be sent. Make sure we have the correct one.  Electronic prescriptions: In compliance with the [facility] Strengthen Opioid Misuse Prevention (STOP) Act of 2017 (Session Conni Elliot [phone]), effective August 18, 2018, all controlled substances must be electronically prescribed. Written prescriptions, faxing, or calling prescriptions to a pharmacy will no longer be done.  Prescription refills: These will be provided only during in-person appointments. No medications will be renewed without a "face-to-face" evaluation with your provider. Applies to all prescriptions.  NOTE: The following applies primarily to controlled substances (Opioid* Pain Medications).   Type of encounter (visit): For patients receiving controlled substances, face-to-face visits are required. (Not an option and not up to the patient.)  Patient's responsibilities: Pain Pills: Bring all pain pills to every appointment (except for procedure appointments). Pill Bottles: Bring pills in original pharmacy bottle. Bring bottle, even if empty. Always bring the bottle of the most recent fill.  Medication refills: You are responsible for knowing and keeping track of what medications you are taking and when is it that you will need a refill. The day before your appointment: write a list of all prescriptions that need to be refilled. The day of the appointment: give the list to the admitting nurse. Prescriptions will be written only  during appointments. No prescriptions will be written on procedure days. If you forget a  medication: it will not be "Called in", "Faxed", or "electronically sent". You will need to get another appointment to get these prescribed. No early refills. Do not call asking to have your prescription filled early. Partial  or short prescriptions: Occasionally your pharmacy may not have enough pills to fill your prescription.  NEVER ACCEPT a partial fill or a prescription that is short of the total amount of pills that you were prescribed.  With controlled substances the law allows 72 hours for the pharmacy to complete the prescription.  If the prescription is not completed within 72 hours, the pharmacist will require a new prescription to be written. This means that you will be short on your medicine and we WILL NOT send another prescription to complete your original prescription.  Instead, request the pharmacy to send a carrier to a nearby branch to get enough medication to provide you with your full prescription. Prescription Accuracy: You are responsible for carefully inspecting your prescriptions before leaving our office. Have the discharge nurse carefully go over each prescription with you, before taking them home. Make sure that your name is accurately spelled, that your address is correct. Check the name and dose of your medication to make sure it is accurate. Check the number of pills, and the written instructions to make sure they are clear and accurate. Make sure that you are given enough medication to last until your next medication refill appointment. Taking Medication: Take medication as prescribed. When it comes to controlled substances, taking less pills or less frequently than prescribed is permitted and encouraged. Never take more pills than instructed. Never take the medication more frequently than prescribed.  Inform other Doctors: Always inform, all of your healthcare providers, of all the  medications you take. Pain Medication from other Providers: You are not allowed to accept any additional pain medication from any other Doctor or Healthcare provider. There are two exceptions to this rule. (see below) In the event that you require additional pain medication, you are responsible for notifying us, as stated below. Cough Medicine: Often these contain an opioid, such as codeine or hydrocodone. Never accept or take cough medicine containing these opioids if you are already taking an opioid* medication. The combination may cause respiratory failure and death. Medication Agreement: You are responsible for carefully reading and following our Medication Agreement. This must be signed before receiving any prescriptions from our practice. Safely store a copy of your signed Agreement. Violations to the Agreement will result in no further prescriptions. (Additional copies of our Medication Agreement are available upon request.) Laws, Rules, & Regulations: All patients are expected to follow all [address] and [employer], [employer], Rules, [place] [employer]. Ignorance of the Laws does not constitute a valid excuse.  Illegal drugs and Controlled Substances: The use of illegal substances (including, but not limited to marijuana and its derivatives) and/or the illegal use of any controlled substances is strictly prohibited. Violation of this rule may result in the immediate and permanent discontinuation of any and all prescriptions being written by our practice. The use of any illegal substances is prohibited. Adopted CDC guidelines & recommendations: Target dosing levels will be at or below 60 MME/day. Use of benzodiazepines** is not recommended.  Exceptions: There are only two exceptions to the rule of not receiving pain medications from other Healthcare Providers. Exception #1 (Emergencies): In the event of an emergency (i.e.: accident requiring emergency care), you are allowed to receive additional pain  medication. However, you are responsible for: As soon as  you are able, call our office [phone], at any time of the day or night, and leave a message stating your name, the date and nature of the emergency, and the name and dose of the medication prescribed. In the event that your call is answered by a member of our staff, make sure to document and save the date, time, and the name of the person that took your information.  Exception #2 (Planned Surgery): In the event that you are scheduled by another doctor or dentist to have any type of surgery or procedure, you are allowed (for a period no longer than 30 days), to receive additional pain medication, for the acute post-op pain. However, in this case, you are responsible for picking up a copy of our "Post-op Pain Management for Surgeons" handout, and giving it to your surgeon or dentist. This document is available at our office, and does not require an appointment to obtain it. Simply go to our office during business hours (Monday-Thursday from 8:00 AM to 4:00 PM) (Friday 8:00 AM to 12:00 Noon) or if you have a scheduled appointment with [place], prior to your surgery, and ask for it by name. In addition, you are responsible for: calling our office [phone], at any time of the day or night, and leaving a message stating your name, name of your surgeon, type of surgery, and date of procedure or surgery. Failure to comply with your responsibilities may result in termination of therapy involving the controlled substances. Medication Agreement Violation. Following the above rules, including your responsibilities will help you in avoiding a Medication Agreement Violation ("Breaking your Pain Medication Contract").  Consequences:  Not following the above rules may result in permanent discontinuation of medication prescription therapy.  *Opioid medications include: morphine, codeine, oxycodone, oxymorphone, hydrocodone, hydromorphone, meperidine, tramadol,  tapentadol, buprenorphine, fentanyl, methadone. **Benzodiazepine medications include: diazepam (Valium), alprazolam (Xanax), clonazepam (Klonopine), lorazepam (Ativan), clorazepate (Tranxene), chlordiazepoxide (Librium), estazolam (Prosom), oxazepam (Serax), temazepam (Restoril), triazolam (Halcion) (Last updated: 06/10/2022) ______________________________________________________________________    ______________________________________________________________________  Medication Recommendations and Reminders  Applies to: All patients receiving prescriptions (written and/or electronic).  Medication Rules & Regulations: You are responsible for reading, knowing, and following our "Medication Rules" document. These exist for your safety and that of others. They are not flexible and neither are we. Dismissing or ignoring them is an act of "non-compliance" that may result in complete and irreversible termination of such medication therapy. For safety reasons, "non-compliance" will not be tolerated. As with the [place] fundamental legal principle of "ignorance of the law is no defense", we will accept no excuses for not having read and knowing the content of documents provided to you by our practice.  Pharmacy of record:  Definition: This is the pharmacy where your electronic prescriptions will be sent.  We do not endorse any particular pharmacy. It is up to you and your insurance to decide what pharmacy to use.  We do not restrict you in your choice of pharmacy. However, once we write for your prescriptions, we will NOT be re-sending more prescriptions to fix restricted supply problems created by your pharmacy, or your insurance.  The pharmacy listed in the electronic medical record should be the one where you want electronic prescriptions to be sent. If you choose to change pharmacy, simply notify our nursing staff. Changes will be made only during your regular appointments and not over the  phone.  Recommendations: Keep all of your pain medications in a safe place, under lock and key, even  if you live alone. We will NOT replace lost, stolen, or damaged medication. We do not accept "Police Reports" as proof of medications having been stolen. After you fill your prescription, take 1 week's worth of pills and put them away in a safe place. You should keep a separate, properly labeled bottle for this purpose. The remainder should be kept in the original bottle. Use this as your primary supply, until it runs out. Once it's gone, then you know that you have 1 week's worth of medicine, and it is time to come in for a prescription refill. If you do this correctly, it is unlikely that you will ever run out of medicine. To make sure that the above recommendation works, it is very important that you make sure your medication refill appointments are scheduled at least 1 week before you run out of medicine. To do this in an effective manner, make sure that you do not leave the office without scheduling your next medication management appointment. Always ask the nursing staff to show you in your prescription , when your medication will be running out. Then arrange for the receptionist to get you a return appointment, at least 7 days before you run out of medicine. Do not wait until you have 1 or 2 pills left, to come in. This is very poor planning and does not take into consideration that we may need to cancel appointments due to bad weather, sickness, or emergencies affecting our staff. DO NOT ACCEPT A "Partial Fill": If for any reason your pharmacy does not have enough pills/tablets to completely fill or refill your prescription, do not allow for a "partial fill". The law allows the pharmacy to complete that prescription within 72 hours, without requiring a new prescription. If they do not fill the rest of your prescription within those 72 hours, you will need a separate prescription to fill the remaining  amount, which we will NOT provide. If the reason for the partial fill is your insurance, you will need to talk to the pharmacist about payment alternatives for the remaining tablets, but again, DO NOT ACCEPT A PARTIAL FILL, unless you can trust your pharmacist to obtain the remainder of the pills within 72 hours.  Prescription refills and/or changes in medication(s):  Prescription refills, and/or changes in dose or medication, will be conducted only during scheduled medication management appointments. (Applies to both, written and electronic prescriptions.) No refills on procedure days. No medication will be changed or started on procedure days. No changes, adjustments, and/or refills will be conducted on a procedure day. Doing so will interfere with the diagnostic portion of the procedure. No phone refills. No medications will be "called into the pharmacy". No Fax refills. No weekend refills. No Holliday refills. No after hours refills.  Remember:  Business hours are:  Monday to Thursday 8:00 AM to 4:00 PM Provider's Schedule: Delano Metz, MD - Appointments are:  Medication management: Monday and Wednesday 8:00 AM to 4:00 PM Procedure day: Tuesday and Thursday 7:30 AM to 4:00 PM Edward Jolly, MD - Appointments are:  Medication management: Tuesday and Thursday 8:00 AM to 4:00 PM Procedure day: Monday and Wednesday 7:30 AM to 4:00 PM (Last update: 06/10/2022) ______________________________________________________________________   ____________________________________________________________________________________________  Naloxone Nasal Spray  Why am I receiving this medication? [place] STOP ACT requires that all patients taking high dose opioids or at risk of opioids respiratory depression, be prescribed an opioid reversal agent, such as Naloxone (AKA: Narcan).  What is this medication? NALOXONE (  nal OX one) treats opioid overdose, which causes slow or shallow breathing,  severe drowsiness, or trouble staying awake. Call emergency services after using this medication. You may need additional treatment. Naloxone works by reversing the effects of opioids. It belongs to a group of medications called opioid blockers.  COMMON BRAND NAME(S): Kloxxado, Narcan  What should I tell my care team before I take this medication? They need to know if you have any of these conditions: Heart disease Substance use disorder An unusual or allergic reaction to naloxone, other medications, foods, dyes, or preservatives Pregnant or trying to get pregnant Breast-feeding  When to use this medication? This medication is to be used for the treatment of respiratory depression (less than 8 breaths per minute) secondary to opioid overdose.   How to use this medication? This medication is for use in the nose. Lay the person on their back. Support their neck with your hand and allow the head to tilt back before giving the medication. The nasal spray should be given into 1 nostril. After giving the medication, move the person onto their side. Do not remove or test the nasal spray until ready to use. Get emergency medical help right away after giving the first dose of this medication, even if the person wakes up. You should be familiar with how to recognize the signs and symptoms of a narcotic overdose. If more doses are needed, give the additional dose in the other nostril. Talk to your care team about the use of this medication in children. While this medication may be prescribed for children as young as newborns for selected conditions, precautions do apply.  Naloxone Overdosage: If you think you have taken too much of this medicine contact a poison control center or emergency room at once.  NOTE: This medicine is only for you. Do not share this medicine with others.  What if I miss a dose? This does not apply.  What may interact with this medication? This is only used during an  emergency. No interactions are expected during emergency use. This list may not describe all possible interactions. Give your health care provider a list of all the medicines, herbs, non-prescription drugs, or dietary supplements you use. Also tell them if you smoke, drink alcohol, or use illegal drugs. Some items may interact with your medicine.  What should I watch for while using this medication? Keep this medication ready for use in the case of an opioid overdose. Make sure that you have the phone number of your care team and local hospital ready. You may need to have additional doses of this medication. Each nasal spray contains a single dose. Some emergencies may require additional doses. After use, bring the treated person to the nearest hospital or call 911. Make sure the treating care team knows that the person has received a dose of this medication. You will receive additional instructions on what to do during and after use of this medication before an emergency occurs.  What side effects may I notice from receiving this medication? Side effects that you should report to your care team as soon as possible: Allergic reactions--skin rash, itching, hives, swelling of the face, lips, tongue, or throat Side effects that usually do not require medical attention (report these to your care team if they continue or are bothersome): Constipation Dryness or irritation inside the nose Headache Increase in blood pressure Muscle spasms Stuffy nose Toothache This list may not describe all possible side effects. Call your doctor for  medical advice about side effects. You may report side effects to FDA at 1-800-FDA-1088.  Where should I keep my medication? Because this is an emergency medication, you should keep it with you at all times.  Keep out of the reach of children and pets. Store between 20 and 25 degrees C (68 and 77 degrees F). Do not freeze. Throw away any unused medication after the  expiration date. Keep in original box until ready to use.  NOTE: This sheet is a summary. It may not cover all possible information. If you have questions about this medicine, talk to your doctor, pharmacist, or health care provider.   2023 Elsevier/Gold Standard (2021-04-12 00:00:00)  ____________________________________________________________________________________________

## 2023-05-25 ENCOUNTER — Ambulatory Visit: Attending: Pain Medicine | Admitting: Pain Medicine

## 2023-05-25 ENCOUNTER — Encounter: Payer: Self-pay | Admitting: Pain Medicine

## 2023-05-25 VITALS — BP 158/81 | HR 78 | Temp 97.2°F | Ht 65.0 in | Wt 228.0 lb

## 2023-05-25 DIAGNOSIS — Z79899 Other long term (current) drug therapy: Secondary | ICD-10-CM | POA: Diagnosis not present

## 2023-05-25 DIAGNOSIS — G894 Chronic pain syndrome: Secondary | ICD-10-CM | POA: Insufficient documentation

## 2023-05-25 DIAGNOSIS — G4486 Cervicogenic headache: Secondary | ICD-10-CM | POA: Insufficient documentation

## 2023-05-25 DIAGNOSIS — M25511 Pain in right shoulder: Secondary | ICD-10-CM | POA: Insufficient documentation

## 2023-05-25 DIAGNOSIS — M160 Bilateral primary osteoarthritis of hip: Secondary | ICD-10-CM | POA: Diagnosis not present

## 2023-05-25 DIAGNOSIS — G8929 Other chronic pain: Secondary | ICD-10-CM | POA: Insufficient documentation

## 2023-05-25 DIAGNOSIS — M25512 Pain in left shoulder: Secondary | ICD-10-CM | POA: Diagnosis not present

## 2023-05-25 DIAGNOSIS — M542 Cervicalgia: Secondary | ICD-10-CM | POA: Diagnosis not present

## 2023-05-25 DIAGNOSIS — R0781 Pleurodynia: Secondary | ICD-10-CM | POA: Diagnosis not present

## 2023-05-25 DIAGNOSIS — M25551 Pain in right hip: Secondary | ICD-10-CM | POA: Diagnosis not present

## 2023-05-25 DIAGNOSIS — Z79891 Long term (current) use of opiate analgesic: Secondary | ICD-10-CM | POA: Insufficient documentation

## 2023-05-25 MED ORDER — TRAMADOL HCL 50 MG PO TABS: 100.0000 mg | ORAL_TABLET | Freq: Four times a day (QID) | ORAL | 5 refills | Status: DC

## 2023-05-25 NOTE — Progress Notes (Signed)
Safety precautions to be maintained throughout the outpatient stay will include: orient to surroundings, keep bed in low position, maintain call bell within reach at all times, provide assistance with transfer out of bed and ambulation.   Nursing Pain Medication Assessment:  Safety precautions to be maintained throughout the outpatient stay will include: orient to surroundings, keep bed in low position, maintain call bell within reach at all times, provide assistance with transfer out of bed and ambulation.  Medication Inspection Compliance: Pill count conducted under aseptic conditions, in front of the patient. Neither the pills nor the bottle was removed from the patient's sight at any time. Once count was completed pills were immediately returned to the patient in their original bottle.  Medication: Tramadol (Ultram) Pill/Patch Count:  13 of 240 pills remain Pill/Patch Appearance: Markings consistent with prescribed medication Bottle Appearance: Standard pharmacy container. Clearly labeled. Filled Date: 7 / 29 / 2024 Last Medication intake:  Today

## 2023-05-28 LAB — TOXASSURE SELECT 13 (MW), URINE

## 2023-05-31 DIAGNOSIS — J449 Chronic obstructive pulmonary disease, unspecified: Secondary | ICD-10-CM | POA: Diagnosis not present

## 2023-06-01 DIAGNOSIS — J449 Chronic obstructive pulmonary disease, unspecified: Secondary | ICD-10-CM | POA: Diagnosis not present

## 2023-06-02 ENCOUNTER — Ambulatory Visit: Payer: Medicare PPO | Attending: Pain Medicine | Admitting: Pain Medicine

## 2023-06-02 ENCOUNTER — Encounter: Payer: Self-pay | Admitting: Pain Medicine

## 2023-06-02 ENCOUNTER — Ambulatory Visit
Admission: RE | Admit: 2023-06-02 | Discharge: 2023-06-02 | Disposition: A | Discharge status: Home / Self Care | Payer: Medicare PPO | Source: Ambulatory Visit | Attending: Pain Medicine | Admitting: Pain Medicine

## 2023-06-02 VITALS — BP 116/68 | HR 74 | Temp 98.4°F | Resp 20 | Ht 65.0 in | Wt 219.0 lb

## 2023-06-02 DIAGNOSIS — M160 Bilateral primary osteoarthritis of hip: Secondary | ICD-10-CM | POA: Diagnosis not present

## 2023-06-02 DIAGNOSIS — Z5189 Encounter for other specified aftercare: Secondary | ICD-10-CM

## 2023-06-02 DIAGNOSIS — M25551 Pain in right hip: Secondary | ICD-10-CM | POA: Diagnosis not present

## 2023-06-02 DIAGNOSIS — G8929 Other chronic pain: Secondary | ICD-10-CM | POA: Diagnosis not present

## 2023-06-02 DIAGNOSIS — M7061 Trochanteric bursitis, right hip: Secondary | ICD-10-CM | POA: Diagnosis not present

## 2023-06-02 DIAGNOSIS — M1611 Unilateral primary osteoarthritis, right hip: Secondary | ICD-10-CM

## 2023-06-02 MED ORDER — ROPIVACAINE HCL 2 MG/ML IJ SOLN
9.0000 mL | Freq: Once | INTRAMUSCULAR | Status: AC
  Administered 2023-06-02: 9 mL via INTRA_ARTICULAR
  Filled 2023-06-02: qty 20

## 2023-06-02 MED ORDER — IOHEXOL 180 MG/ML  SOLN
10.0000 mL | Freq: Once | INTRAMUSCULAR | Status: AC
  Administered 2023-06-02: 10 mL via INTRA_ARTICULAR

## 2023-06-02 MED ORDER — LIDOCAINE HCL 2 % IJ SOLN
20.0000 mL | Freq: Once | INTRAMUSCULAR | Status: AC
  Administered 2023-06-02: 200 mg
  Filled 2023-06-02: qty 20

## 2023-06-02 MED ORDER — IOHEXOL 180 MG/ML  SOLN
INTRAMUSCULAR | Status: AC
  Filled 2023-06-02: qty 20

## 2023-06-02 MED ORDER — METHYLPREDNISOLONE ACETATE 80 MG/ML IJ SUSP
80.0000 mg | Freq: Once | INTRAMUSCULAR | Status: AC
  Administered 2023-06-02: 80 mg via INTRA_ARTICULAR
  Filled 2023-06-02: qty 1

## 2023-06-02 MED ORDER — PENTAFLUOROPROP-TETRAFLUOROETH EX AERO
INHALATION_SPRAY | Freq: Once | CUTANEOUS | Status: DC
  Filled 2023-06-02: qty 30

## 2023-06-02 MED ORDER — MIDAZOLAM HCL 2 MG/2ML IJ SOLN
0.5000 mg | Freq: Once | INTRAMUSCULAR | Status: AC
  Administered 2023-06-02: 2 mg via INTRAVENOUS
  Filled 2023-06-02: qty 2

## 2023-06-02 NOTE — Progress Notes (Signed)
PROVIDER NOTE: Interpretation of information contained herein should be left to medically-trained personnel. Specific patient instructions are provided elsewhere under "Patient Instructions" section of medical record. This document was created in part using STT-dictation technology, any transcriptional errors that may result from this process are unintentional.  Patient: Regina Campos Type: Established DOB: 09-Sep-1950 MRN: [id number] PCP: Carlean Jews, PA-C  Service: Procedure DOS: 06/02/2023 Setting: Ambulatory Location: Ambulatory outpatient facility Delivery: Face-to-face Provider: Oswaldo Done, MD Specialty: Interventional Pain Management Specialty designation: 09 Location: Outpatient facility Ref. Prov.: McDonough, Salomon Fick, PA*       Interventional Therapy   Type: Hip & bursae injection #2  Laterality: Right (-RT)  Bursae: Trochanteric  Laterality: Right (-RT)  Approach: Percutaneous posterolateral approach. Level: Lower pelvic and hip joint level.  Imaging: Fluoroscopy-guided Non-spinal ([id number]) Anesthesia: Local anesthesia (1-2% Lidocaine) Anxiolysis: IV Versed 2.0 mg Sedation: No Sedation                       DOS: 06/02/2023  Performed by: Oswaldo Done, MD  Purpose: Diagnostic/Therapeutic Indications: Hip pain severe enough to impact quality of life or function. Rationale (medical necessity): procedure needed and proper for the diagnosis and/or treatment of Regina Campos's medical symptoms and needs. 1. Chronic hip pain (Right)   2. Osteoarthritis of hip (Right)   3. 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 Encounter for therapeutic procedure    NAS-11 Pain score:   Pre-procedure: 6 /10   Post-procedure: 0-No pain/10      Target: Intra-articular aspect of the hip joint & peri-articular bursae Region: Hip joint proper. Femoral region Procedure Type: Percutaneous injection   Position / Prep / Materials:  Position: Lateral Decubitus  with affected side up  Prep solution: DuraPrep (Iodine Povacrylex [0.7% available iodine] and Isopropyl Alcohol, 74% w/w) Prep Area:  Entire Posterolateral hip area. Materials:  Tray: Block tray Needle(s):  Type: Spinal  Gauge (G): 22  Length: 7-in  Qty: 1  H&P (Pre-op Assessment):  Regina Campos is a 72 y.o. (year old), female patient, seen today for interventional treatment. She  has a past surgical history that includes Abdominal hysterectomy; Total shoulder replacement; Replacement total knee; Appendectomy; Lumbar fusion; Carpal tunnel release; Back surgery (07/25/2016); Cataract extraction w/PHACO (Right, 10/13/2019); Cataract extraction w/PHACO (Left, 11/24/2019); Total thyroidectomy; Colonoscopy (N/A, 10/21/2021); Esophagogastroduodenoscopy (egd) with propofol (N/A, 10/21/2021); Colonoscopy with propofol (N/A, 12/17/2021); Eye surgery; Joint replacement; and Colonoscopy with propofol (N/A, 11/28/2022). Regina Campos has a current medication list which includes the following prescription(s): albuterol, celecoxib, diclofenac sodium, duloxetine, gabapentin, ipratropium-albuterol, naloxone, oxygen-helium, rosuvastatin, synthroid, tiotropium, tramadol, and triamterene-hydrochlorothiazide, and the following Facility-Administered Medications: iohexol and pentafluoroprop-tetrafluoroeth. Her primarily concern today is the Hip Pain (right)  Initial Vital Signs:  Pulse/HCG Rate: 76ECG Heart Rate: 63 Temp: 98.4 F (36.9 C) Resp: 16 BP: (!) 146/90 SpO2: 95 %  BMI: Estimated body mass index is 36.44 kg/m as calculated from the following:   Height as of this encounter: 5\' 5"  (1.651 m).   Weight as of this encounter: 219 lb (99.3 kg).  Risk Assessment: Allergies: Reviewed. She has No Known Allergies.  Allergy Precautions: None required Coagulopathies: Reviewed. None identified.  Blood-thinner therapy: None at this time Active Infection(s): Reviewed. None identified. Regina Campos is afebrile  Site  Confirmation: Regina Campos was asked to confirm the procedure and laterality before marking the site Procedure checklist: Completed Consent: Before the procedure and under the influence of no sedative(s), amnesic(s), or anxiolytics, the patient  was informed of the treatment options, risks and possible complications. To fulfill our ethical and legal obligations, as recommended by the American Medical Association's Code of Ethics, I have informed the patient of my clinical impression; the nature and purpose of the treatment or procedure; the risks, benefits, and possible complications of the intervention; the alternatives, including doing nothing; the risk(s) and benefit(s) of the alternative treatment(s) or procedure(s); and the risk(s) and benefit(s) of doing nothing. The patient was provided information about the general risks and possible complications associated with the procedure. These may include, but are not limited to: failure to achieve desired goals, infection, bleeding, organ or nerve damage, allergic reactions, paralysis, and death. In addition, the patient was informed of those risks and complications associated to the procedure, such as failure to decrease pain; infection; bleeding; organ or nerve damage with subsequent damage to sensory, motor, and/or autonomic systems, resulting in permanent pain, numbness, and/or weakness of one or several areas of the body; allergic reactions; (i.e.: anaphylactic reaction); and/or death. Furthermore, the patient was informed of those risks and complications associated with the medications. These include, but are not limited to: allergic reactions (i.e.: anaphylactic or anaphylactoid reaction(s)); adrenal axis suppression; blood sugar elevation that in diabetics may result in ketoacidosis or comma; water retention that in patients with history of congestive heart failure may result in shortness of breath, pulmonary edema, and decompensation with resultant heart  failure; weight gain; swelling or edema; medication-induced neural toxicity; particulate matter embolism and blood vessel occlusion with resultant organ, and/or nervous system infarction; and/or aseptic necrosis of one or more joints. Finally, the patient was informed that Medicine is not an exact science; therefore, there is also the possibility of unforeseen or unpredictable risks and/or possible complications that may result in a catastrophic outcome. The patient indicated having understood very clearly. We have given the patient no guarantees and we have made no promises. Enough time was given to the patient to ask questions, all of which were answered to the patient's satisfaction. Ms. Dewitt has indicated that she wanted to continue with the procedure. Attestation: I, the ordering provider, attest that I have discussed with the patient the benefits, risks, side-effects, alternatives, likelihood of achieving goals, and potential problems during recovery for the procedure that I have provided informed consent. Date  Time: 06/02/2023 11:49 AM  Pre-Procedure Preparation:  Monitoring: As per clinic protocol. Respiration, ETCO2, SpO2, BP, heart rate and rhythm monitor placed and checked for adequate function Safety Precautions: Patient was assessed for positional comfort and pressure points before starting the procedure. Time-out: I initiated and conducted the "Time-out" before starting the procedure, as per protocol. The patient was asked to participate by confirming the accuracy of the "Time Out" information. Verification of the correct person, site, and procedure were performed and confirmed by me, the nursing staff, and the patient. "Time-out" conducted as per Joint Commission's Universal Protocol (UP.01.01.01). Time: 1214 Start Time: 1214 hrs.  Narrative                Rationale (medical necessity): procedure needed and proper for the diagnosis and/or treatment of the patient's medical symptoms  and needs. Procedural Technique Safety Precautions: Aspiration looking for blood return was conducted prior to all injections. At no point did we inject any substances, as a needle was being advanced. No attempts were made at seeking any paresthesias. Safe injection practices and needle disposal techniques used. Medications properly checked for expiration dates. SDV (single dose vial) medications used. Description of the  Procedure: Protocol guidelines were followed. The patient was assisted into a comfortable position. The target area was identified and the area prepped in the usual manner. Skin & deeper tissues infiltrated with local anesthetic. Appropriate amount of time allowed to pass for local anesthetics to take effect. The procedure needles were then advanced to the target area. Proper needle placement secured. Negative aspiration confirmed. Solution injected in intermittent fashion, asking for systemic symptoms every 0.5cc of injectate. The needles were then removed and the area cleansed, making sure to leave some of the prepping solution back to take advantage of its long term bactericidal properties.  Technical description of procedure:  Skin & deeper tissues infiltrated with local anesthetic. Appropriate amount of time allowed to pass for local anesthetics to take effect. The procedure needles were then advanced to the target area. Proper needle placement secured. Negative aspiration confirmed. Solution injected in intermittent fashion, asking for systemic symptoms every 0.5cc of injectate. The needles were then removed and the area cleansed, making sure to leave some of the prepping solution back to take advantage of its long term bactericidal properties.             Vitals:   06/02/23 1213 06/02/23 1218 06/02/23 1223 06/02/23 1228  BP: (!) 149/82 (!) 141/83 (!) 147/84 116/68  Pulse:    74  Resp: 10 11 16 20   Temp:      SpO2: 95% 91% 96% 96%  Weight:      Height:         Start  Time: 1214 hrs. End Time: 1221 hrs.  Imaging Guidance (Non-Spinal):          Type of Imaging Technique: Fluoroscopy Guidance (Non-Spinal) Indication(s): Assistance in needle guidance and placement for procedures requiring needle placement in or near specific anatomical locations not easily accessible without such assistance. Exposure Time: Please see nurses notes. Contrast: Before injecting any contrast, we confirmed that the patient did not have an allergy to iodine, shellfish, or radiological contrast. Once satisfactory needle placement was completed at the desired level, radiological contrast was injected. Contrast injected under live fluoroscopy. No contrast complications. See chart for type and volume of contrast used. Fluoroscopic Guidance: I was personally present during the use of fluoroscopy. "Tunnel Vision Technique" used to obtain the best possible view of the target area. Parallax error corrected before commencing the procedure. "Direction-depth-direction" technique used to introduce the needle under continuous pulsed fluoroscopy. Once target was reached, antero-posterior, oblique, and lateral fluoroscopic projection used confirm needle placement in all planes. Images permanently stored in EMR. Interpretation: I personally interpreted the imaging intraoperatively. Adequate needle placement confirmed in multiple planes. Appropriate spread of contrast into desired area was observed. No evidence of afferent or efferent intravascular uptake. Permanent images saved into the patient's record.  Post-operative Assessment:  Post-procedure Vital Signs:  Pulse/HCG Rate: 7462 Temp: 98.4 F (36.9 C) Resp: 20 BP: 116/68 SpO2: 96 %  EBL: None  Complications: No immediate post-treatment complications observed by team, or reported by patient.  Note: The patient tolerated the entire procedure well. A repeat set of vitals were taken after the procedure and the patient was kept under observation  following institutional policy, for this type of procedure. Post-procedural neurological assessment was performed, showing return to baseline, prior to discharge. The patient was provided with post-procedure discharge instructions, including a section on how to identify potential problems. Should any problems arise concerning this procedure, the patient was given instructions to immediately contact us, at any time, without hesitation.  In any case, we plan to contact the patient by telephone for a follow-up status report regarding this interventional procedure.  Comments:  No additional relevant information.  Plan of Care (POC)  Orders:  Orders Placed This Encounter  Procedures   HIP INJECTION    Scheduling Instructions:     Side: Right-sided     Sedation: Patient's choice.     Timeframe: Today   DG PAIN [facility] C-ARM 1-60 MIN NO REPORT    Intraoperative interpretation by procedural physician at [facility].    Standing Status:   Standing    Number of Occurrences:   1    Order Specific Question:   Reason for exam:    Answer:   Assistance in needle guidance and placement for procedures requiring needle placement in or near specific anatomical locations not easily accessible without such assistance.   Informed Consent Details: Physician/Practitioner Attestation; Transcribe to consent form and obtain patient signature    Nursing Order: Transcribe to consent form and obtain patient signature. Note: Always confirm laterality of pain with Ms. Oertel, before procedure.    Order Specific Question:   Physician/Practitioner attestation of informed consent for procedure/surgical case    Answer:   I, the physician/practitioner, attest that I have discussed with the patient the benefits, risks, side effects, alternatives, likelihood of achieving goals and potential problems during recovery for the procedure that I have provided informed consent.    Order Specific Question:   Procedure     Answer:   Hip injection    Order Specific Question:   Physician/Practitioner performing the procedure    Answer:   Charizma Gardiner A. Laban Emperor, MD    Order Specific Question:   Indication/Reason    Answer:   Hip Joint Pain (Arthralgia)   Provide equipment / supplies at bedside    Procedure tray: "Block Tray" (Disposable  single use) Skin infiltration needle: Regular 1.5-in, 25-G, (x1) Block Needle type: Spinal Amount/quantity: 1 Size: Long (7-inch) Gauge: 22G    Standing Status:   Standing    Number of Occurrences:   1    Order Specific Question:   Specify    Answer:   Block Tray   Saline lock IV    Have LR [phone] mL available and administer at 125 mL/hr if patient becomes hypotensive.    Standing Status:   Standing    Number of Occurrences:   1   Chronic Opioid Analgesic:  Tramadol 50 mg, 2 tabs PO q 6 hrs (400 mg/day of tramadol) MME/day: 40 mg/day.   Medications ordered for procedure: Meds ordered this encounter  Medications   lidocaine (XYLOCAINE) 2 % (with pres) injection 400 mg   pentafluoroprop-tetrafluoroeth (GEBAUERS) aerosol   midazolam (VERSED) injection 0.5-2 mg    Make sure Flumazenil is available in the pyxis when using this medication. If oversedation occurs, administer 0.2 mg IV over 15 sec. If after 45 sec no response, administer 0.2 mg again over 1 min; may repeat at 1 min intervals; not to exceed 4 doses (1 mg)   ropivacaine (PF) 2 mg/mL (0.2%) (NAROPIN) injection 9 mL   methylPREDNISolone acetate (DEPO-MEDROL) injection 80 mg   iohexol (OMNIPAQUE) 180 MG/ML injection 10 mL    Must be Myelogram-compatible. If not available, you may substitute with a water-soluble, non-ionic, hypoallergenic, myelogram-compatible radiological contrast medium.   Medications administered: We administered lidocaine, midazolam, ropivacaine (PF) 2 mg/mL (0.2%), and methylPREDNISolone acetate.  See the medical record for exact dosing, route, and time of administration.  Follow-up plan:    Return in about 2 weeks (around 06/16/2023) for (Face2F), (PPE).       Interventional Therapies  Risk  Complexity Considerations:   Estimated body mass index is 39.94 kg/m as calculated from the following:   Height as of 04/02/21: 5\' 5"  (1.651 m).   Weight as of 04/02/21: 240 lb (108.9 kg).   NO RFA 2ry to Lumbar Hardware  WNL   Planned  Pending:   Therapeutic right IA hip injection #2    Under consideration:   Diagnostic bilateral lumbar facet MBB #2  SCS/Pump   Completed:   Diagnostic bilateral lumbar facet MBB x1 (11/14/2021) (100/100/80)  Therapeutic midline/right L2-3 LESI x4 (10/08/2021) (100/100/90/100)  Diagnostic/therapeutic bilateral foot 4th TMT steroid injection x2 (03/01/2020) (100/100/80/>75)  Diagnostic/therapeutic left intercostal NB of ribs 5-8 x1 (02/07/2020) (100/100/75/85)  Diagnostic right dorsal tarsometatarsal joint #4 injection x1 (12/31/2017)  Palliative right L3-4 LESI x2 (09/20/2015)  Palliative right L4 TFESI x2 (09/20/2015)  Palliative right C7-T1 CESI x1 (07/03/2015)  Palliative left IA shoulder joint inj. x1 (05/12/2017)  Palliative right suprascapular NB x1 (11/19/2016)  Palliative left suprascapular NB x2 (06/04/2017)  Palliative bilateral IA Hip injection x1 (10/01/2016)  Therapeutic right ring finger (#4) A-1 pulley, trigger finger sheath inj. x1 (04/07/2017)  Diagnostic bilateral dorsolateral Cuboid and 5th PMC, foot inj. x1 (12/03/2017)    Therapeutic  Palliative (PRN) options:   Palliative right L2-3 LESI #5  Palliative right C7-T1 CESI #2  Therapeutic right finger #4 (ring) A-1 pulley area, trigger finger tendon sheath injection #2  Diagnostic bilateral dorsolateral junction of Cuboid and 5th PMC, foot steroid injection #2         Recent Visits Date Type Provider Dept  05/25/23 Office Visit Delano Metz, MD [facility]  Showing recent visits within past 90 days and meeting all other requirements Today's  Visits Date Type Provider Dept  06/02/23 Procedure visit Delano Metz, MD [facility]  Showing today's visits and meeting all other requirements Future Appointments Date Type Provider Dept  06/16/23 Appointment Delano Metz, MD [facility]  Showing future appointments within next 90 days and meeting all other requirements  Disposition: Discharge home  Discharge (Date  Time): 06/02/2023; 1229 hrs.   Primary Care Physician: Carlean Jews, PA-C Location: [facility] Note by: Oswaldo Done, MD (TTS technology used. I apologize for any typographical errors that were not detected and corrected.) Date: 06/02/2023; Time: 12:51 PM  Disclaimer:  Medicine is not an Visual merchandiser. The only guarantee in medicine is that nothing is guaranteed. It is important to note that the decision to proceed with this intervention was based on the information collected from the patient. The Data and conclusions were drawn from the patient's questionnaire, the interview, and the physical examination. Because the information was provided in large part by the patient, it cannot be guaranteed that it has not been purposely or unconsciously manipulated. Every effort has been made to obtain as much relevant data as possible for this evaluation. It is important to note that the conclusions that lead to this procedure are derived in large part from the available data. Always take into account that the treatment will also be dependent on availability of resources and existing treatment guidelines, considered by other Pain Management Practitioners as being common knowledge and practice, at the time of the intervention. For Medico-Legal purposes, it is also important to point out that variation in procedural techniques and pharmacological choices are  the acceptable norm. The indications, contraindications, technique, and results of the above procedure should only  be interpreted and judged by a Board-Certified Interventional Pain Specialist with extensive familiarity and expertise in the same exact procedure and technique.

## 2023-06-02 NOTE — Patient Instructions (Signed)

## 2023-06-02 NOTE — Progress Notes (Signed)
Safety precautions to be maintained throughout the outpatient stay will include: orient to surroundings, keep bed in low position, maintain call bell within reach at all times, provide assistance with transfer out of bed and ambulation.  

## 2023-06-03 ENCOUNTER — Telehealth: Payer: Self-pay

## 2023-06-03 NOTE — Telephone Encounter (Signed)
Post procedure follow up.  LM 

## 2023-06-16 ENCOUNTER — Ambulatory Visit (HOSPITAL_BASED_OUTPATIENT_CLINIC_OR_DEPARTMENT_OTHER): Payer: Medicare PPO | Admitting: Pain Medicine

## 2023-06-16 DIAGNOSIS — Z09 Encounter for follow-up examination after completed treatment for conditions other than malignant neoplasm: Secondary | ICD-10-CM

## 2023-06-16 DIAGNOSIS — Z91199 Patient's noncompliance with other medical treatment and regimen due to unspecified reason: Secondary | ICD-10-CM

## 2023-06-16 NOTE — Progress Notes (Signed)
(  06/15/2024) NO-SHOW. Patient called and canceled on the day of the postprocedure evaluation.

## 2023-07-01 DIAGNOSIS — J449 Chronic obstructive pulmonary disease, unspecified: Secondary | ICD-10-CM | POA: Diagnosis not present

## 2023-07-05 ENCOUNTER — Other Ambulatory Visit: Payer: Self-pay | Admitting: Internal Medicine

## 2023-07-11 ENCOUNTER — Encounter: Payer: Self-pay | Admitting: Nurse Practitioner

## 2023-07-11 DIAGNOSIS — J4489 Other specified chronic obstructive pulmonary disease: Secondary | ICD-10-CM | POA: Insufficient documentation

## 2023-07-23 ENCOUNTER — Ambulatory Visit: Admitting: Physician Assistant

## 2023-07-27 ENCOUNTER — Other Ambulatory Visit: Payer: Self-pay | Admitting: Internal Medicine

## 2023-07-29 ENCOUNTER — Telehealth: Payer: Self-pay | Admitting: Physician Assistant

## 2023-07-29 ENCOUNTER — Telehealth: Payer: Self-pay | Admitting: Nurse Practitioner

## 2023-07-29 NOTE — Telephone Encounter (Signed)
error 

## 2023-07-29 NOTE — Telephone Encounter (Signed)
Received albuterol rx. Gave to AA for signature-Toni

## 2023-07-29 NOTE — Telephone Encounter (Signed)
Albuterol rx signed. Faxed back to [employer]; [phone]. Scanned-Toni

## 2023-07-31 DIAGNOSIS — J449 Chronic obstructive pulmonary disease, unspecified: Secondary | ICD-10-CM | POA: Diagnosis not present

## 2023-08-31 DIAGNOSIS — J449 Chronic obstructive pulmonary disease, unspecified: Secondary | ICD-10-CM | POA: Diagnosis not present

## 2023-09-09 ENCOUNTER — Other Ambulatory Visit: Payer: Self-pay | Admitting: Internal Medicine

## 2023-09-09 DIAGNOSIS — G8929 Other chronic pain: Secondary | ICD-10-CM

## 2023-09-13 ENCOUNTER — Other Ambulatory Visit: Payer: Self-pay | Admitting: Internal Medicine

## 2023-09-13 DIAGNOSIS — I10 Essential (primary) hypertension: Secondary | ICD-10-CM

## 2023-09-13 DIAGNOSIS — E039 Hypothyroidism, unspecified: Secondary | ICD-10-CM

## 2023-09-13 DIAGNOSIS — G8929 Other chronic pain: Secondary | ICD-10-CM

## 2023-09-14 ENCOUNTER — Other Ambulatory Visit: Payer: Self-pay | Admitting: Internal Medicine

## 2023-09-15 DIAGNOSIS — J449 Chronic obstructive pulmonary disease, unspecified: Secondary | ICD-10-CM | POA: Diagnosis not present

## 2023-09-17 DIAGNOSIS — G4733 Obstructive sleep apnea (adult) (pediatric): Secondary | ICD-10-CM | POA: Diagnosis not present

## 2023-09-22 ENCOUNTER — Other Ambulatory Visit: Payer: Self-pay

## 2023-09-22 DIAGNOSIS — E039 Hypothyroidism, unspecified: Secondary | ICD-10-CM

## 2023-09-22 MED ORDER — SYNTHROID 100 MCG PO TABS: 100.0000 ug | ORAL_TABLET | Freq: Every day | ORAL | 1 refills | Status: DC

## 2023-10-01 DIAGNOSIS — J449 Chronic obstructive pulmonary disease, unspecified: Secondary | ICD-10-CM | POA: Diagnosis not present

## 2023-10-28 DIAGNOSIS — G4733 Obstructive sleep apnea (adult) (pediatric): Secondary | ICD-10-CM | POA: Diagnosis not present

## 2023-10-29 DIAGNOSIS — J449 Chronic obstructive pulmonary disease, unspecified: Secondary | ICD-10-CM | POA: Diagnosis not present

## 2023-11-10 ENCOUNTER — Ambulatory Visit: Admitting: Internal Medicine

## 2023-11-12 ENCOUNTER — Ambulatory Visit (INDEPENDENT_AMBULATORY_CARE_PROVIDER_SITE_OTHER): Admitting: Physician Assistant

## 2023-11-12 ENCOUNTER — Encounter: Payer: Self-pay | Admitting: Physician Assistant

## 2023-11-12 VITALS — BP 130/85 | HR 83 | Temp 97.6°F | Resp 16 | Ht 65.0 in | Wt 233.6 lb

## 2023-11-12 DIAGNOSIS — Z1283 Encounter for screening for malignant neoplasm of skin: Secondary | ICD-10-CM | POA: Diagnosis not present

## 2023-11-12 DIAGNOSIS — Z0000 Encounter for general adult medical examination without abnormal findings: Secondary | ICD-10-CM | POA: Diagnosis not present

## 2023-11-12 DIAGNOSIS — Z1231 Encounter for screening mammogram for malignant neoplasm of breast: Secondary | ICD-10-CM | POA: Diagnosis not present

## 2023-11-12 DIAGNOSIS — F331 Major depressive disorder, recurrent, moderate: Secondary | ICD-10-CM | POA: Diagnosis not present

## 2023-11-12 DIAGNOSIS — E039 Hypothyroidism, unspecified: Secondary | ICD-10-CM | POA: Diagnosis not present

## 2023-11-12 MED ORDER — BUPROPION HCL ER (XL) 150 MG PO TB24: 150.0000 mg | ORAL_TABLET | Freq: Every day | ORAL | 1 refills | Status: AC
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
## 2023-11-17 ENCOUNTER — Inpatient Hospital Stay
Admission: RE | Admit: 2023-11-17 | Discharge: 2023-11-17 | Disposition: A | Discharge status: Home / Self Care | Payer: Self-pay | Source: Ambulatory Visit | Attending: Physician Assistant

## 2023-11-17 ENCOUNTER — Other Ambulatory Visit: Payer: Self-pay | Admitting: *Deleted

## 2023-11-17 DIAGNOSIS — Z1231 Encounter for screening mammogram for malignant neoplasm of breast: Secondary | ICD-10-CM

## 2023-11-17 NOTE — Patient Instructions (Signed)

## 2023-11-18 ENCOUNTER — Ambulatory Visit (HOSPITAL_BASED_OUTPATIENT_CLINIC_OR_DEPARTMENT_OTHER): Admitting: Pain Medicine

## 2023-11-18 DIAGNOSIS — G4486 Cervicogenic headache: Secondary | ICD-10-CM

## 2023-11-18 DIAGNOSIS — Z79899 Other long term (current) drug therapy: Secondary | ICD-10-CM

## 2023-11-18 DIAGNOSIS — Z79891 Long term (current) use of opiate analgesic: Secondary | ICD-10-CM

## 2023-11-18 DIAGNOSIS — R0781 Pleurodynia: Secondary | ICD-10-CM

## 2023-11-18 DIAGNOSIS — G894 Chronic pain syndrome: Secondary | ICD-10-CM

## 2023-11-18 DIAGNOSIS — Z91199 Patient's noncompliance with other medical treatment and regimen due to unspecified reason: Secondary | ICD-10-CM

## 2023-11-18 DIAGNOSIS — G8929 Other chronic pain: Secondary | ICD-10-CM

## 2023-11-18 DIAGNOSIS — M542 Cervicalgia: Secondary | ICD-10-CM

## 2023-11-19 ENCOUNTER — Ambulatory Visit: Admitting: Nurse Practitioner

## 2023-11-19 ENCOUNTER — Ambulatory Visit

## 2023-11-23 ENCOUNTER — Encounter: Payer: Self-pay | Admitting: Nurse Practitioner

## 2023-11-23 ENCOUNTER — Ambulatory Visit (INDEPENDENT_AMBULATORY_CARE_PROVIDER_SITE_OTHER): Admitting: Nurse Practitioner

## 2023-11-23 ENCOUNTER — Telehealth: Payer: Self-pay | Admitting: Physician Assistant

## 2023-11-23 VITALS — BP 146/80 | HR 71 | Temp 97.4°F | Resp 16 | Ht 65.0 in | Wt 232.0 lb

## 2023-11-23 DIAGNOSIS — G4734 Idiopathic sleep related nonobstructive alveolar hypoventilation: Secondary | ICD-10-CM

## 2023-11-23 DIAGNOSIS — G4733 Obstructive sleep apnea (adult) (pediatric): Secondary | ICD-10-CM | POA: Diagnosis not present

## 2023-11-23 DIAGNOSIS — J449 Chronic obstructive pulmonary disease, unspecified: Secondary | ICD-10-CM

## 2023-11-23 DIAGNOSIS — Z7189 Other specified counseling: Secondary | ICD-10-CM
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
## 2023-11-23 NOTE — Telephone Encounter (Signed)
 Notified Beth & Sarah w/ [facility] of cpap replacement & supply order-Toni

## 2023-11-24 ENCOUNTER — Telehealth: Payer: Self-pay

## 2023-11-24 ENCOUNTER — Ambulatory Visit
Admission: RE | Admit: 2023-11-24 | Discharge: 2023-11-24 | Disposition: A | Discharge status: Home / Self Care | Source: Ambulatory Visit | Attending: Physician Assistant | Admitting: Physician Assistant

## 2023-11-24 DIAGNOSIS — Z1231 Encounter for screening mammogram for malignant neoplasm of breast: Secondary | ICD-10-CM | POA: Diagnosis not present

## 2023-11-24 NOTE — Telephone Encounter (Signed)
 Called pharmacy and cancelled any outstanding Tramadol prescriptions.

## 2023-11-24 NOTE — Patient Instructions (Signed)
 ______________________________________________________________________    New Medication Management Provider - Randalyn Rhea, NP  Purpose: To inform patients of the addition of a new member to our group, Randalyn Rhea, NP.  Applies to: All patients receiving prescriptions from our practice (written or electronic).  Announcement: We are happy to announce the addition of Randalyn Rhea, NP to or practice ([facility]).  She will take up a support role assisting our interventional pain specialists in the management of our patients.  She will be primarily assigned to the medication management portion of our practice.  Physician assignment: Patient will continue to be assigned to their current Pain Specialist Physician however, Ms. Allena Katz, NP will take over the Medication Management visits along with the responsibilities associated with such visits.  Medication Management: Any questions or inquiries associated with the day-to-day management of your pain medications, refills, or anything else associated with those prescriptions should be directed to Ms. Randalyn Rhea, NP.  Interventional Therapies: All issues associated with these therapies will continue to be managed by your Primary Pain Specialist.   Requesting appointments: When requesting that appointment, please make sure to specify whether the appointment is for Medication Management (to be scheduled with Ms. Allena Katz, NP) or if an evaluation is required/desired with your Primary Pain Specialist.  (Last updated: 11/17/2023) ______________________________________________________________________     ______________________________________________________________________    Procedure instructions  Stop blood-thinners  Do not eat or drink fluids (other than water) for 6 hours before your procedure  No water for 2 hours before your procedure  Take your blood pressure medicine with a sip of  water  Arrive 30 minutes before your appointment  If sedation is planned, bring suitable driver. Pennie Banter, Benedetto Goad, & public transportation are NOT APPROVED)  Carefully read the "Preparing for your procedure" detailed instructions  If you have questions call us at [phone]  Procedure appointments are for procedures only. NO medication refills or new problem evaluations.   ______________________________________________________________________      ______________________________________________________________________    Preparing for your procedure  Appointments: If you think you may not be able to keep your appointment, call 24-48 hours in advance to cancel. We need time to make it available to others.  Procedure visits are for procedures only. During your procedure appointment there will be: NO Prescription Refills*. NO medication changes or discussions*. NO discussion of disability issues*. NO unrelated pain problem evaluations*. NO evaluations to order other pain procedures*. *These will be addressed at a separate and distinct evaluation encounter on the provider's evaluation schedule and not during procedure days.  Instructions: Food intake: Avoid eating anything solid for at least 8 hours prior to your procedure. Clear liquid intake: You may take clear liquids such as water up to 2 hours prior to your procedure. (No carbonated drinks. No soda.) Transportation: Unless otherwise stated by your physician, bring a driver. (Driver cannot be a Market researcher, Pharmacist, community, or any other form of public transportation.) Morning Medicines: Except for blood thinners, take all of your other morning medications with a sip of water. Make sure to take your heart and blood pressure medicines. If your blood pressure's lower number is above 100, the case will be rescheduled. Blood thinners: Make sure to stop your blood thinners as instructed.  If you take a blood thinner, but were not instructed to stop it, call  our office [phone] and ask to talk to a nurse. Not stopping a blood thinner prior to certain procedures could lead to serious complications. Diabetics on  insulin: Notify the staff so that you can be scheduled 1st case in the morning. If your diabetes requires high dose insulin, take only  of your normal insulin dose the morning of the procedure and notify the staff that you have done so. Preventing infections: Shower with an antibacterial soap the morning of your procedure.  Build-up your immune system: Take 1000 mg of Vitamin C with every meal (3 times a day) the day prior to your procedure. Antibiotics: Inform the nursing staff if you are taking any antibiotics or if you have any conditions that may require antibiotics prior to procedures. (Example: recent joint implants)   Pregnancy: If you are pregnant make sure to notify the nursing staff. Not doing so may result in injury to the fetus, including death.  Sickness: If you have a cold, fever, or any active infections, call and cancel or reschedule your procedure. Receiving steroids while having an infection may result in complications. Arrival: You must be in the facility at least 30 minutes prior to your scheduled procedure. Tardiness: Your scheduled time is also the cutoff time. If you do not arrive at least 15 minutes prior to your procedure, you will be rescheduled.  Children: Do not bring any children with you. Make arrangements to keep them home. Dress appropriately: There is always a possibility that your clothing may get soiled. Avoid long dresses. Valuables: Do not bring any jewelry or valuables.  Reasons to call and reschedule or cancel your procedure: (Following these recommendations will minimize the risk of a serious complication.) Surgeries: Avoid having procedures within 2 weeks of any surgery. (Avoid for 2 weeks before or after any surgery). Flu Shots: Avoid having procedures within 2 weeks of a flu shots or . (Avoid for 2  weeks before or after immunizations). Barium: Avoid having a procedure within 7-10 days after having had a radiological study involving the use of radiological contrast. (Myelograms, Barium swallow or enema study). Heart attacks: Avoid any elective procedures or surgeries for the initial 6 months after a "Myocardial Infarction" (Heart Attack). Blood thinners: It is imperative that you stop these medications before procedures. Let us know if you if you take any blood thinner.  Infection: Avoid procedures during or within two weeks of an infection (including chest colds or gastrointestinal problems). Symptoms associated with infections include: Localized redness, fever, chills, night sweats or profuse sweating, burning sensation when voiding, cough, congestion, stuffiness, runny nose, sore throat, diarrhea, nausea, vomiting, cold or Flu symptoms, recent or current infections. It is specially important if the infection is over the area that we intend to treat. Heart and lung problems: Symptoms that may suggest an active cardiopulmonary problem include: cough, chest pain, breathing difficulties or shortness of breath, dizziness, ankle swelling, uncontrolled high or unusually low blood pressure, and/or palpitations. If you are experiencing any of these symptoms, cancel your procedure and contact your primary care physician for an evaluation.  Remember:  Regular Business hours are:  Monday to Thursday 8:00 AM to 4:00 PM  Provider's Schedule: Delano Metz, MD:  Procedure days: Tuesday and Thursday 7:30 AM to 4:00 PM  Edward Jolly, MD:  Procedure days: Monday and Wednesday 7:30 AM to 4:00 PM Last  Updated: 07/28/2023 ______________________________________________________________________      ______________________________________________________________________    General Risks and Possible Complications  Patient Responsibilities: It is important that you read this as it is part of your  informed consent. It is our duty to inform you of the risks and possible complications associated with  treatments offered to you. It is your responsibility as a patient to read this and to ask questions about anything that is not clear or that you believe was not covered in this document.  Patient's Rights: You have the right to refuse treatment. You also have the right to change your mind, even after initially having agreed to have the treatment done. However, under this last option, if you wait until the last second to change your mind, you may be charged for the materials used up to that point.  Introduction: Medicine is not an Visual merchandiser. Everything in Medicine, including the lack of treatment(s), carries the potential for danger, harm, or loss (which is by definition: Risk). In Medicine, a complication is a secondary problem, condition, or disease that can aggravate an already existing one. All treatments carry the risk of possible complications. The fact that a side effects or complications occurs, does not imply that the treatment was conducted incorrectly. It must be clearly understood that these can happen even when everything is done following the highest safety standards.  No treatment: You can choose not to proceed with the proposed treatment alternative. The "PRO(s)" would include: avoiding the risk of complications associated with the therapy. The "CON(s)" would include: not getting any of the treatment benefits. These benefits fall under one of three categories: diagnostic; therapeutic; and/or palliative. Diagnostic benefits include: getting information which can ultimately lead to improvement of the disease or symptom(s). Therapeutic benefits are those associated with the successful treatment of the disease. Finally, palliative benefits are those related to the decrease of the primary symptoms, without necessarily curing the condition (example: decreasing the pain from a flare-up of a  chronic condition, such as incurable terminal cancer).  General Risks and Complications: These are associated to most interventional treatments. They can occur alone, or in combination. They fall under one of the following six (6) categories: no benefit or worsening of symptoms; bleeding; infection; nerve damage; allergic reactions; and/or death. No benefits or worsening of symptoms: In Medicine there are no guarantees, only probabilities. No healthcare provider can ever guarantee that a medical treatment will work, they can only state the probability that it may. Furthermore, there is always the possibility that the condition may worsen, either directly, or indirectly, as a consequence of the treatment. Bleeding: This is more common if the patient is taking a blood thinner, either prescription or over the counter (example: Goody Powders, Fish oil, Aspirin, Garlic, etc.), or if suffering a condition associated with impaired coagulation (example: Hemophilia, cirrhosis of the liver, low platelet counts, etc.). However, even if you do not have one on these, it can still happen. If you have any of these conditions, or take one of these drugs, make sure to notify your treating physician. Infection: This is more common in patients with a compromised immune system, either due to disease (example: diabetes, cancer, human immunodeficiency virus [HIV], etc.), or due to medications or treatments (example: therapies used to treat cancer and rheumatological diseases). However, even if you do not have one on these, it can still happen. If you have any of these conditions, or take one of these drugs, make sure to notify your treating physician. Nerve Damage: This is more common when the treatment is an invasive one, but it can also happen with the use of medications, such as those used in the treatment of cancer. The damage can occur to small secondary nerves, or to large primary ones, such as those in the spinal cord  and  brain. This damage may be temporary or permanent and it may lead to impairments that can range from temporary numbness to permanent paralysis and/or brain death. Allergic Reactions: Any time a substance or material comes in contact with our body, there is the possibility of an allergic reaction. These can range from a mild skin rash (contact dermatitis) to a severe systemic reaction (anaphylactic reaction), which can result in death. Death: In general, any medical intervention can result in death, most of the time due to an unforeseen complication. ______________________________________________________________________       ______________________________________________________________________    Opioid Pain Medication Update  To: All patients taking opioid pain medications. (I.e.: hydrocodone, hydromorphone, oxycodone, oxymorphone, morphine, codeine, methadone, tapentadol, tramadol, buprenorphine, fentanyl, etc.)  Re: Updated review of side effects and adverse reactions of opioid analgesics, as well as new information about long term effects of this class of medications.  Direct risks of long-term opioid therapy are not limited to opioid addiction and overdose. Potential medical risks include serious fractures, breathing problems during sleep, hyperalgesia, immunosuppression, chronic constipation, bowel obstruction, myocardial infarction, and tooth decay secondary to xerostomia.  Unpredictable adverse effects that can occur even if you take your medication correctly: Cognitive impairment, respiratory depression, and death. Most people think that if they take their medication "correctly", and "as instructed", that they will be safe. Nothing could be farther from the truth. In reality, a significant amount of recorded deaths associated with the use of opioids has occurred in individuals that had taken the medication for a long time, and were taking their medication correctly. The following are  examples of how this can happen: Patient taking his/her medication for a long time, as instructed, without any side effects, is given a certain antibiotic or another unrelated medication, which in turn triggers a "Drug-to-drug interaction" leading to disorientation, cognitive impairment, impaired reflexes, respiratory depression or an untoward event leading to serious bodily harm or injury, including death.  Patient taking his/her medication for a long time, as instructed, without any side effects, develops an acute impairment of liver and/or kidney function. This will lead to a rapid inability of the body to breakdown and eliminate their pain medication, which will result in effects similar to an "overdose", but with the same medicine and dose that they had always taken. This again may lead to disorientation, cognitive impairment, impaired reflexes, respiratory depression or an untoward event leading to serious bodily harm or injury, including death.  A similar problem will occur with patients as they grow older and their liver and kidney function begins to decrease as part of the aging process.  Background information: Historically, the original case for using long-term opioid therapy to treat chronic noncancer pain was based on safety assumptions that subsequent experience has called into question. In 1996, the American Pain Society and the American Academy of Pain Medicine issued a consensus statement supporting long-term opioid therapy. This statement acknowledged the dangers of opioid prescribing but concluded that the risk for addiction was low; respiratory depression induced by opioids was short-lived, occurred mainly in opioid-naive patients, and was antagonized by pain; tolerance was not a common problem; and efforts to control diversion should not constrain opioid prescribing. This has now proven to be wrong. Experience regarding the risks for opioid addiction, misuse, and overdose in community  practice has failed to support these assumptions.  According to the Centers for Disease Control and Prevention, fatal overdoses involving opioid analgesics have increased sharply over the past decade. Currently, more than 96,700  people die from drug overdoses every year. Opioids are a factor in 7 out of every 10 overdose deaths. Deaths from drug overdose have surpassed motor vehicle accidents as the leading cause of death for individuals between the ages of 74 and 78.  Clinical data suggest that neuroendocrine dysfunction may be very common in both men and women, potentially causing hypogonadism, erectile dysfunction, infertility, decreased libido, osteoporosis, and depression. Recent studies linked higher opioid dose to increased opioid-related mortality. Controlled observational studies reported that long-term opioid therapy may be associated with increased risk for cardiovascular events. Subsequent meta-analysis concluded that the safety of long-term opioid therapy in elderly patients has not been proven.   Side Effects and adverse reactions: Common side effects: Drowsiness (sedation). Dizziness. Nausea and vomiting. Constipation. Physical dependence -- Dependence often manifests with withdrawal symptoms when opioids are discontinued or decreased. Tolerance -- As you take repeated doses of opioids, you require increased medication to experience the same effect of pain relief. Respiratory depression -- This can occur in healthy people, especially with higher doses. However, people with COPD, asthma or other lung conditions may be even more susceptible to fatal respiratory impairment.  Uncommon side effects: An increased sensitivity to feeling pain and extreme response to pain (hyperalgesia). Chronic use of opioids can lead to this. Delayed gastric emptying (the process by which the contents of your stomach are moved into your small intestine). Muscle rigidity. Immune system and hormonal  dysfunction. Quick, involuntary muscle jerks (myoclonus). Arrhythmia. Itchy skin (pruritus). Dry mouth (xerostomia).  Long-term side effects: Chronic constipation. Sleep-disordered breathing (SDB). Increased risk of bone fractures. Hypothalamic-pituitary-adrenal dysregulation. Increased risk of overdose.  RISKS: Respiratory depression and death: Opioids increase the risk of respiratory depression and death.  Drug-to-drug interactions: Opioids are relatively contraindicated in combination with benzodiazepines, sleep inducers, and other central nervous system depressants. Other classes of medications (i.e.: certain antibiotics and even over-the-counter medications) may also trigger or induce respiratory depression in some patients.  Medical conditions: Patients with pre-existing respiratory problems are at higher risk of respiratory failure and/or depression when in combination with opioid analgesics. Opioids are relatively contraindicated in some medical conditions such as central sleep apnea.   Fractures and Falls:  Opioids increase the risk and incidence of falls. This is of particular importance in elderly patients.  Endocrine System:  Long-term administration is associated with endocrine abnormalities (endocrinopathies). (Also known as Opioid-induced Endocrinopathy) Influences on both the hypothalamic-pituitary-adrenal axis?and the hypothalamic-pituitary-gonadal axis have been demonstrated with consequent hypogonadism and adrenal insufficiency in both sexes. Hypogonadism and decreased levels of dehydroepiandrosterone sulfate have been reported in men and women. Endocrine effects include: Amenorrhoea in women (abnormal absence of menstruation) Reduced libido in both sexes Decreased sexual function Erectile dysfunction in men Hypogonadisms (decreased testicular function with shrinkage of testicles) Infertility Depression and fatigue Loss of muscle mass Anxiety Depression Immune  suppression Hyperalgesia Weight gain Anemia Osteoporosis Patients (particularly women of childbearing age) should avoid opioids. There is insufficient evidence to recommend routine monitoring of asymptomatic patients taking opioids in the long-term for hormonal deficiencies.  Immune System: Human studies have demonstrated that opioids have an immunomodulating effect. These effects are mediated via opioid receptors both on immune effector cells and in the central nervous system. Opioids have been demonstrated to have adverse effects on antimicrobial response and anti-tumour surveillance. Buprenorphine has been demonstrated to have no impact on immune function.  Opioid Induced Hyperalgesia: Human studies have demonstrated that prolonged use of opioids can lead to a state of abnormal pain sensitivity, sometimes  called opioid induced hyperalgesia (OIH). Opioid induced hyperalgesia is not usually seen in the absence of tolerance to opioid analgesia. Clinically, hyperalgesia may be diagnosed if the patient on long-term opioid therapy presents with increased pain. This might be qualitatively and anatomically distinct from pain related to disease progression or to breakthrough pain resulting from development of opioid tolerance. Pain associated with hyperalgesia tends to be more diffuse than the pre-existing pain and less defined in quality. Management of opioid induced hyperalgesia requires opioid dose reduction.  Cancer: Chronic opioid therapy has been associated with an increased risk of cancer among noncancer patients with chronic pain. This association was more evident in chronic strong opioid users. Chronic opioid consumption causes significant pathological changes in the small intestine and colon. Epidemiological studies have found that there is a link between opium dependence and initiation of gastrointestinal cancers. Cancer is the second leading cause of death after cardiovascular disease.  Chronic use of opioids can cause multiple conditions such as GERD, immunosuppression and renal damage as well as carcinogenic effects, which are associated with the incidence of cancers.   Mortality: Long-term opioid use has been associated with increased mortality among patients with chronic non-cancer pain (CNCP).  Prescription of long-acting opioids for chronic noncancer pain was associated with a significantly increased risk of all-cause mortality, including deaths from causes other than overdose.  Reference: Von Korff M, Kolodny A, Deyo RA, Chou R. Long-term opioid therapy reconsidered. Ann Intern Med. 2011 Sep 6;155(5):325-8. doi: 10.7326/[id number]. PMID: 16109604; PMCID: [id number]. Randon Goldsmith, Hayward RA, Dunn KM, [place] KP. Risk of adverse events in patients prescribed long-term opioids: A cohort study in the [place] [facility] [employer]. Eur J Pain. 2019 May;23(5):908-922. doi: 10.1002/ejp.1357. Epub 2019 Jan 31. PMID: 78295621. Colameco S, Coren JS, Ciervo CA. Continuous opioid treatment for chronic noncancer pain: a time for moderation in prescribing. Postgrad Med. 2009 Jul;121(4):61-6. doi: 10.3810/pgm.2009.07.2032. PMID: 30865784. William Hamburger RN, Colton SD, Blazina I, Cristopher Peru, Bougatsos C, Deyo RA. The effectiveness and risks of long-term opioid therapy for chronic pain: a systematic review for a [employer] to [employer]. Ann Intern Med. 2015 Feb 17;162(4):276-86. doi: 10.7326/M14-2559. PMID: 69629528. Caryl Bis [facility], Makuc DM. NCHS Data Brief No. 22. [place]: Centers for Disease Control and Prevention; 2009. Sep, Increase in Fatal Poisonings Involving Opioid Analgesics in the [place], 1999-2006. Song IA, Choi HR, Oh TK. Long-term opioid use and mortality in patients with chronic non-cancer pain: Ten-year follow-up study in [place] from 2010 through 2019.  EClinicalMedicine. 2022 Jul 18;51:101558. doi: 10.1016/j.eclinm.2022.413244. PMID: 01027253; PMCID: [id number]. Huser, W., Schubert, T., Vogelmann, T. et al. All-cause mortality in patients with long-term opioid therapy compared with non-opioid analgesics for chronic non-cancer pain: a database study. BMC Med 18, 162 (2020). http://lester.info/ Rashidian H, Karie Kirks, Malekzadeh R, Haghdoost AA. An Ecological Study of the Association between Opiate Use and Incidence of Cancers. Addict Health. 2016 Fall;8(4):252-260. PMID: 59563875; PMCID: [id number].  Our Goal: Our goal is to control your pain with means other than the use of opioid pain medications.  Our Recommendation: Talk to your physician about coming off of these medications. We can assist you with the tapering down and stopping these medicines. Based on the new information, even if you cannot completely stop the medication, a decrease in the dose may be associated with a lesser risk. Ask for other means of controlling the pain. Decrease or eliminate those factors that significantly  contribute to your pain such as smoking, obesity, and a diet heavily tilted towards "inflammatory" nutrients.  Last Updated: 02/23/2023   ______________________________________________________________________       ______________________________________________________________________    National Pain Medication Shortage  The [place] is experiencing worsening drug shortages. These have had a negative widespread effect on patient care and treatment. Not expected to improve any time soon. Predicted to last past 2029.   Drug shortage list (generic names) Oxycodone IR Oxycodone/APAP Oxymorphone IR Hydromorphone Hydrocodone/APAP Morphine  Where is the problem?  Manufacturing and supply level.  Will this shortage affect you?  Only if you take any of the above pain medications.  How? You may be unable to fill your  prescription.  Your pharmacist may offer a "partial fill" of your prescription. (Warning: Do not accept partial fills.) Prescriptions partially filled cannot be transferred to another pharmacy. Read our Medication Rules and Regulation. Depending on how much medicine you are dependent on, you may experience withdrawals when unable to get the medication.  Recommendations: Consider ending your dependence on opioid pain medications. Ask your pain specialist to assist you with the process. Consider switching to a medication currently not in shortage, such as Buprenorphine. Talk to your pain specialist about this option. Consider decreasing your pain medication requirements by managing tolerance thru "Drug Holidays". This may help minimize withdrawals, should you run out of medicine. Control your pain thru the use of non-pharmacological interventional therapies.   Your prescriber: Prescribers cannot be blamed for shortages. Medication manufacturing and supply issues cannot be fixed by the prescriber.   NOTE: The prescriber is not responsible for supplying the medication, or solving supply issues. Work with your pharmacist to solve it. The patient is responsible for the decision to take or continue taking the medication and for identifying and securing a legal supply source. By law, supplying the medication is the job and responsibility of the pharmacy. The prescriber is responsible for the evaluation, monitoring, and prescribing of these medications.   Prescribers will NOT: Re-issue prescriptions that have been partially filled. Re-issue prescriptions already sent to a pharmacy.  Re-send prescriptions to a different pharmacy because yours did not have your medication. Ask pharmacist to order more medicine or transfer the prescription to another pharmacy. (Read below.)  New 2023 regulation: "April 18, 2022 Revised Regulation Allows DEA-Registered Pharmacies to Transfer Electronic Prescriptions at  a Patient's Request DEA Headquarters Division - Public Information Office Patients now have the ability to request their electronic prescription be transferred to another pharmacy without having to go back to their practitioner to initiate the request. This revised regulation went into effect on Monday, April 14, 2022.     At a patient's request, a DEA-registered retail pharmacy can now transfer an electronic prescription for a controlled substance (schedules II-V) to another DEA-registered retail pharmacy. Prior to this change, patients would have to go through their practitioner to cancel their prescription and have it re-issued to a different pharmacy. The process was taxing and time consuming for both patients and practitioners.    The Drug Enforcement Administration [facility]) published its intent to revise the process for transferring electronic prescriptions on July 06, 2020.  The final rule was published in the federal register on March 13, 2022 and went into effect 30 days later.  Under the final rule, a prescription can only be transferred once between pharmacies, and only if allowed under existing state or other applicable law. The prescription must remain in its electronic form; may not be altered in  any way; and the transfer must be communicated directly between two licensed pharmacists. It's important to note, any authorized refills transfer with the original prescription, which means the entire prescription will be filled at the same pharmacy".  Reference: [email or web address] [facility] website announcement)  [email or web address] [employer] of [employer])   [employer] / Vol. 88, No. 143 / Thursday, March 13, 2022 / Rules and Regulations DEPARTMENT OF JUSTICE  Drug Enforcement Administration  21 CFR Part 1306  [Docket No. [id number]]   RIN [id number] Transfer of Electronic Prescriptions for Schedules II-V Controlled Substances Between Pharmacies for Initial Filling  ______________________________________________________________________       ______________________________________________________________________    Transfer of Pain Medication between Pharmacies  Re: 2023 DEA Clarification on existing regulation  Published on DEA Website: April 18, 2022  Title: Revised Regulation Allows DEA-Registered Pharmacies to Electrical engineer Prescriptions at a Patient's Request DEA Headquarters Division - [employer]  "Patients now have the ability to request their electronic prescription be transferred to another pharmacy without having to go back to their practitioner to initiate the request. This revised regulation went into effect on Monday, April 14, 2022.     At a patient's request, a DEA-registered retail pharmacy can now transfer an electronic prescription for a controlled substance (schedules II-V) to another DEA-registered retail pharmacy. Prior to this change, patients would have to go through their practitioner to cancel their prescription and have it re-issued to a different pharmacy. The process was taxing and time consuming for both patients and practitioners.    The Drug Enforcement Administration [facility]) published its intent to revise the process for transferring electronic prescriptions on July 06, 2020.  The final rule was published in the federal register on March 13, 2022 and went into effect 30 days later.  Under the final rule, a prescription can only be transferred once between pharmacies, and only if allowed under existing state or other applicable law. The prescription must remain in its electronic form; may not be altered in any way; and the transfer must be communicated directly between two licensed pharmacists. It's important to note, any authorized refills transfer with the original  prescription, which means the entire prescription will be filled at the same pharmacy."    REFERENCES: 1. DEA website announcement [email or web address]  2. Department of Justice website  [email or web address]  3. DEPARTMENT OF JUSTICE Drug Enforcement Administration 21 CFR Part 1306 [Docket No. [id number]] RIN [id number]-AB64 "Transfer of Electronic Prescriptions for Schedules II-V Controlled Substances Between Pharmacies for Initial Filling"  ______________________________________________________________________       ______________________________________________________________________    Medication Rules  Purpose: To inform patients, and their family members, of our medication rules and regulations.  Applies to: All patients receiving prescriptions from our practice (written or electronic).  Pharmacy of record: This is the pharmacy where your electronic prescriptions will be sent. Make sure we have the correct one.  Electronic prescriptions: In compliance with the [facility] Strengthen Opioid Misuse Prevention (STOP) Act of 2017 (Session Conni Elliot [phone]), effective August 18, 2018, all controlled substances must be electronically prescribed. Written prescriptions, faxing, or calling prescriptions to a pharmacy will no longer be done.  Prescription refills: These will be provided only during in-person appointments. No medications will be renewed without a "face-to-face" evaluation with your provider. Applies to all prescriptions.  NOTE: The following applies primarily to controlled substances (Opioid* Pain Medications).   Type of encounter (visit): For patients receiving controlled substances, face-to-face visits are required. (Not an option and not up  to the patient.)  Patient's Responsibilities: Pain Pills: Bring all pain pills to every  appointment (except for procedure appointments). Pill counts are required.  Pill Bottles: Bring pills in original pharmacy bottle. Bring bottle, even if empty. Always bring the bottle of the most recent fill.  Medication refills: You are responsible for knowing and keeping track of what medications you are taking and when is it that you will need a refill. The day before your appointment: write a list of all prescriptions that need to be refilled. The day of the appointment: give the list to the admitting nurse. Prescriptions will be written only during appointments. No prescriptions will be written on procedure days. If you forget a medication: it will not be "Called in", "Faxed", or "electronically sent". You will need to get another appointment to get these prescribed. No early refills. Do not call asking to have your prescription filled early. Partial  or short prescriptions: Occasionally your pharmacy may not have enough pills to fill your prescription.  NEVER ACCEPT a partial fill or a prescription that is short of the total amount of pills that you were prescribed.  With controlled substances the law allows 72 hours for the pharmacy to complete the prescription.  If the prescription is not completed within 72 hours, the pharmacist will require a new prescription to be written. This means that you will be short on your medicine and we WILL NOT send another prescription to complete your original prescription.  Instead, request the pharmacy to send a carrier to a nearby branch to get enough medication to provide you with your full prescription. Prescription Accuracy: You are responsible for carefully inspecting your prescriptions before leaving our office. Have the discharge nurse carefully go over each prescription with you, before taking them home. Make sure that your name is accurately spelled, that your address is correct. Check the name and dose of your medication to make sure it is accurate. Check  the number of pills, and the written instructions to make sure they are clear and accurate. Make sure that you are given enough medication to last until your next medication refill appointment. Taking Medication: Take medication as prescribed. When it comes to controlled substances, taking less pills or less frequently than prescribed is permitted and encouraged. Never take more pills than instructed. Never take the medication more frequently than prescribed.  Inform other Doctors: Always inform, all of your healthcare providers, of all the medications you take. Pain Medication from other Providers: You are not allowed to accept any additional pain medication from any other Doctor or Healthcare provider. There are two exceptions to this rule. (see below) In the event that you require additional pain medication, you are responsible for notifying us, as stated below. Cough Medicine: Often these contain an opioid, such as codeine or hydrocodone. Never accept or take cough medicine containing these opioids if you are already taking an opioid* medication. The combination may cause respiratory failure and death. Medication Agreement: You are responsible for carefully reading and following our Medication Agreement. This must be signed before receiving any prescriptions from our practice. Safely store a copy of your signed Agreement. Violations to the Agreement will result in no further prescriptions. (Additional copies of our Medication Agreement are available upon request.) Laws, Rules, & Regulations: All patients are expected to follow all [address] and [employer], Rules, [employer]. Ignorance of the Laws does not constitute a valid excuse.  Illegal drugs and Controlled Substances: The use of illegal substances (  including, but not limited to marijuana and its derivatives) and/or the illegal use of any controlled substances is strictly prohibited. Violation of this rule may result in the immediate and  permanent discontinuation of any and all prescriptions being written by our practice. The use of any illegal substances is prohibited. Adopted CDC guidelines & recommendations: Target dosing levels will be at or below 60 MME/day. Use of benzodiazepines** is not recommended. Urine Drug testing: Patients taking controlled substances will be required to provide a urine sample upon request. Do not void before coming to your medication management appointments. Hold emptying your bladder until you are admitted. The admitting nurse will inform you if a sample is required. Our practice reserves the right to call you at any time to provide a sample. Once receiving the call, you have 24 hours to comply with request. Not providing a sample upon request may result in termination of medication therapy.  Exceptions: There are only two exceptions to the rule of not receiving pain medications from other Healthcare Providers. Exception #1 (Emergencies): In the event of an emergency (i.e.: accident requiring emergency care), you are allowed to receive additional pain medication. However, you are responsible for: As soon as you are able, call our office [phone], at any time of the day or night, and leave a message stating your name, the date and nature of the emergency, and the name and dose of the medication prescribed. In the event that your call is answered by a member of our staff, make sure to document and save the date, time, and the name of the person that took your information.  Exception #2 (Planned Surgery): In the event that you are scheduled by another doctor or dentist to have any type of surgery or procedure, you are allowed (for a period no longer than 30 days), to receive additional pain medication, for the acute post-op pain. However, in this case, you are responsible for picking up a copy of our "Post-op Pain Management for Surgeons" handout, and giving it to your surgeon or dentist. This document is  available at our office, and does not require an appointment to obtain it. Simply go to our office during business hours (Monday-Thursday from 8:00 AM to 4:00 PM) (Friday 8:00 AM to 12:00 Noon) or if you have a scheduled appointment with [place], prior to your surgery, and ask for it by name. In addition, you are responsible for: calling our office [phone], at any time of the day or night, and leaving a message stating your name, name of your surgeon, type of surgery, and date of procedure or surgery. Failure to comply with your responsibilities may result in termination of therapy involving the controlled substances.  Consequences:  Non-compliance with the above rules may result in permanent discontinuation of medication prescription therapy. All patients receiving any type of controlled substance is expected to comply with the above patient responsibilities. Not doing so may result in permanent discontinuation of medication prescription therapy. Medication Agreement Violation. Following the above rules, including your responsibilities will help you in avoiding a Medication Agreement Violation ("Breaking your Pain Medication Contract").  *Opioid medications include: morphine, codeine, oxycodone, oxymorphone, hydrocodone, hydromorphone, meperidine, tramadol, tapentadol, buprenorphine, fentanyl, methadone. **Benzodiazepine medications include: diazepam (Valium), alprazolam (Xanax), clonazepam (Klonopine), lorazepam (Ativan), clorazepate (Tranxene), chlordiazepoxide (Librium), estazolam (Prosom), oxazepam (Serax), temazepam (Restoril), triazolam (Halcion) (Last updated: 06/10/2023) ______________________________________________________________________      ______________________________________________________________________    Medication Recommendations and Reminders  Applies to: All patients receiving prescriptions (written and/or
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
## 2023-11-25 ENCOUNTER — Ambulatory Visit: Attending: Pain Medicine | Admitting: Pain Medicine

## 2023-11-25 DIAGNOSIS — Z79891 Long term (current) use of opiate analgesic: Secondary | ICD-10-CM | POA: Insufficient documentation

## 2023-11-25 DIAGNOSIS — M542 Cervicalgia: Secondary | ICD-10-CM | POA: Diagnosis not present

## 2023-11-25 DIAGNOSIS — M25512 Pain in left shoulder: Secondary | ICD-10-CM | POA: Insufficient documentation

## 2023-11-25 DIAGNOSIS — G894 Chronic pain syndrome: Secondary | ICD-10-CM | POA: Insufficient documentation

## 2023-11-25 DIAGNOSIS — Z79899 Other long term (current) drug therapy: Secondary | ICD-10-CM | POA: Insufficient documentation

## 2023-11-25 DIAGNOSIS — G4486 Cervicogenic headache: Secondary | ICD-10-CM | POA: Insufficient documentation

## 2023-11-25 DIAGNOSIS — M25511 Pain in right shoulder: Secondary | ICD-10-CM | POA: Insufficient documentation

## 2023-11-25 DIAGNOSIS — R0781 Pleurodynia: Secondary | ICD-10-CM | POA: Insufficient documentation

## 2023-11-25 DIAGNOSIS — G8929 Other chronic pain: Secondary | ICD-10-CM | POA: Insufficient documentation

## 2023-11-25 MED ORDER — TRAMADOL HCL 50 MG PO TABS: 50.0000 mg | ORAL_TABLET | Freq: Four times a day (QID) | ORAL | 3 refills | Status: DC | PRN

## 2023-11-25 MED ORDER — NALOXONE HCL 4 MG/0.1ML NA LIQD: 1.0000 | NASAL | 1 refills | Status: DC | PRN

## 2023-11-25 NOTE — Progress Notes (Unsigned)
 Nursing Pain Medication Assessment:  Safety precautions to be maintained throughout the outpatient stay will include: orient to surroundings, keep bed in low position, maintain call bell within reach at all times, provide assistance with transfer out of bed and ambulation.  Medication Inspection Compliance: Pill count conducted under aseptic conditions, in front of the patient. Neither the pills nor the bottle was removed from the patient's sight at any time. Once count was completed pills were immediately returned to the patient in their original bottle.  Medication: Tramadol (Ultram) Pill/Patch Count:  237 of 240 pills remain Pill/Patch Appearance: Markings consistent with prescribed medication Bottle Appearance: Standard pharmacy container. Clearly labeled. Filled Date: 04 / 05 / 2025 Last Medication intake:  Today

## 2023-11-29 DIAGNOSIS — J449 Chronic obstructive pulmonary disease, unspecified: Secondary | ICD-10-CM | POA: Diagnosis not present

## 2023-11-30 ENCOUNTER — Telehealth: Payer: Self-pay | Admitting: Physician Assistant

## 2023-11-30 NOTE — Telephone Encounter (Signed)
 Dermatology referral sent via [facility] to [facility]

## 2023-12-10 DIAGNOSIS — G4733 Obstructive sleep apnea (adult) (pediatric): Secondary | ICD-10-CM | POA: Diagnosis not present

## 2023-12-29 DIAGNOSIS — J449 Chronic obstructive pulmonary disease, unspecified: Secondary | ICD-10-CM | POA: Diagnosis not present

## 2024-01-06 DIAGNOSIS — J449 Chronic obstructive pulmonary disease, unspecified: Secondary | ICD-10-CM | POA: Diagnosis not present

## 2024-01-29 DIAGNOSIS — J449 Chronic obstructive pulmonary disease, unspecified: Secondary | ICD-10-CM | POA: Diagnosis not present

## 2024-02-02 ENCOUNTER — Other Ambulatory Visit: Payer: Self-pay | Admitting: Internal Medicine

## 2024-02-10 ENCOUNTER — Other Ambulatory Visit: Payer: Self-pay

## 2024-02-10 DIAGNOSIS — R0602 Shortness of breath: Secondary | ICD-10-CM

## 2024-02-11 ENCOUNTER — Ambulatory Visit: Admitting: Physician Assistant

## 2024-02-13 ENCOUNTER — Emergency Department
Admission: EM | Admit: 2024-02-13 | Discharge: 2024-02-13 | Disposition: A | Discharge status: Home / Self Care | Attending: Emergency Medicine | Admitting: Emergency Medicine

## 2024-02-13 ENCOUNTER — Emergency Department

## 2024-02-13 ENCOUNTER — Other Ambulatory Visit: Payer: Self-pay

## 2024-02-13 DIAGNOSIS — S52614A Nondisplaced fracture of right ulna styloid process, initial encounter for closed fracture: Secondary | ICD-10-CM | POA: Diagnosis not present

## 2024-02-13 DIAGNOSIS — W010XXA Fall on same level from slipping, tripping and stumbling without subsequent striking against object, initial encounter: Secondary | ICD-10-CM | POA: Insufficient documentation

## 2024-02-13 DIAGNOSIS — M19031 Primary osteoarthritis, right wrist: Secondary | ICD-10-CM | POA: Diagnosis not present

## 2024-02-13 DIAGNOSIS — M25531 Pain in right wrist: Secondary | ICD-10-CM | POA: Diagnosis not present

## 2024-02-13 DIAGNOSIS — S52571A Other intraarticular fracture of lower end of right radius, initial encounter for closed fracture: Secondary | ICD-10-CM | POA: Insufficient documentation

## 2024-02-13 DIAGNOSIS — S52511A Displaced fracture of right radial styloid process, initial encounter for closed fracture: Secondary | ICD-10-CM | POA: Diagnosis not present

## 2024-02-13 DIAGNOSIS — S52501A Unspecified fracture of the lower end of right radius, initial encounter for closed fracture: Secondary | ICD-10-CM | POA: Diagnosis not present

## 2024-02-13 NOTE — ED Triage Notes (Signed)
 Pt reports falling yesterday and injuring her right wrist. Pt denies hitting her head, no thinners, pt states her wrist is deformed. Pt wrist is currently in a wrap. Pt is able to move all of her fingers in her right hand.

## 2024-02-13 NOTE — Discharge Instructions (Signed)
 Please continue with sling, immobilization.  Call orthopedic office on Monday to schedule follow-up appointment.  Return to the ER for any severe pain, numbness, tingling, worsening symptoms or any urgent changes in your health.  Keep splint clean and dry.  Use sling at all times

## 2024-02-13 NOTE — ED Provider Notes (Signed)
 [place] EMERGENCY DEPARTMENT AT [facility] Provider Note   CSN: [id number] Arrival date & time: 02/13/24  1355     Patient presents with: Wrist Injury   Regina Campos is a 73 y.o. female presents to the emergency department for evaluation of of mechanical fall.  She tripped over her dog last night and her right wrist and hand went in between the porch railing.  She has some pain and swelling to the right wrist.  She denies any other pain or injury to her body.  No head injury LOC nausea vomiting.  She has had some pain and swelling throughout the right wrist.  No numbness or tingling.    Prior to Admission medications   Medication Sig Start Date End Date Taking? Authorizing Provider  albuterol  (VENTOLIN  HFA) 108 (90 Base) MCG/ACT inhaler Inhale 2 puffs into the lungs every 6 (six) hours as needed for wheezing or shortness of breath. 04/14/23   Khan, Fozia M, MD  buPROPion  (WELLBUTRIN  XL) 150 MG 24 hr tablet Take 1 tablet (150 mg total) by mouth daily. 11/12/23   McDonough, Lauren K, PA-C  celecoxib  (CELEBREX ) 100 MG capsule TAKE 1 CAPSULE BY MOUTH AT BEDTIME FOR ARTHRITIS 09/14/23   Khan, Fozia M, MD  diclofenac  Sodium (VOLTAREN  ARTHRITIS PAIN) 1 % GEL Apply 2 g topically in the morning, at noon, and at bedtime. Patient not taking: Reported on 11/25/2023 03/03/23   Khan, Fozia M, MD  DULoxetine  (CYMBALTA ) 30 MG capsule TAKE 1 CAPSULE BY MOUTH EVERY DAY 09/09/23   Khan, Fozia M, MD  gabapentin  (NEURONTIN ) 100 MG capsule Take 1 capsule (100 mg total) by mouth 3 (three) times daily. 04/14/23   Khan, Fozia M, MD  ipratropium-albuterol  (DUONEB) 0.5-2.5 (3) MG/3ML SOLN USE 1 VIAL IN NEBULIZER EVERY 6 HOURS - As Needed 07/27/23   Fernand Elfreda LABOR, MD  naloxone  (NARCAN ) nasal spray 4 mg/0.1 mL Place 1 spray into the nose as needed for up to 365 doses (for opioid-induced respiratory depresssion). In case of emergency (overdose), spray once into each nostril. If no response within 3 minutes,  repeat application and call 911. 11/25/23 11/24/24  Tanya Glisson, MD  OXYGEN  Inhale into the lungs. 2l at night    [provider]  rosuvastatin  (CRESTOR ) 5 MG tablet Take one tab twice a week 04/14/23   Khan, Fozia M, MD  SYNTHROID  100 MCG tablet Take 1 tablet (100 mcg total) by mouth daily before breakfast. 09/22/23   Khan, Fozia M, MD  tiotropium (SPIRIVA ) 18 MCG inhalation capsule Place 1 capsule (18 mcg total) into inhaler and inhale daily. 04/14/23   Khan, Fozia M, MD  traMADol  (ULTRAM ) 50 MG tablet Take 1 tablet (50 mg total) by mouth every 6 (six) hours as needed. Each refill must last 30 days. 01/23/24 05/22/24  Tanya Glisson, MD  triamterene -hydrochlorothiazide (MAXZIDE-25) 37.5-25 MG tablet TAKE HALF TAB BY MOUTH DAILY IN AM 09/14/23   Khan, Fozia M, MD    Allergies: Patient has no known allergies.    Review of Systems  Updated Vital Signs BP (!) 140/74 (BP Location: Left Arm)   Pulse 72   Temp 98.2 F (36.8 C) (Oral)   Resp 16   Ht 5' 5 (1.651 m)   Wt 106.6 kg   SpO2 96%   BMI 39.11 kg/m   Physical Exam Constitutional:      Appearance: She is well-developed.  HENT:     Head: Normocephalic and atraumatic.     Right Ear:  External ear normal.     Left Ear: External ear normal.     Nose: Nose normal.   Eyes:     Conjunctiva/sclera: Conjunctivae normal.     Pupils: Pupils are equal, round, and reactive to light.    Cardiovascular:     Rate and Rhythm: Normal rate.  Pulmonary:     Effort: Pulmonary effort is normal. No respiratory distress.     Breath sounds: Normal breath sounds.  Abdominal:     Palpations: Abdomen is soft.     Tenderness: There is no abdominal tenderness.   Musculoskeletal:        General: No deformity. Normal range of motion.     Cervical back: Normal range of motion.     Comments: Tenderness throughout the distal radius and distal ulna.  No deformity.  Mild swelling and bruising noted.  Minimal superficial abrasions noted.   Sensation is intact distally with 2+ radial pulse.  No scaphoid tenderness.  No proximal forearm or elbow tenderness.  Normal range of motion of the elbow and right shoulder.   Skin:    General: Skin is warm and dry.     Findings: No rash.   Neurological:     General: No focal deficit present.     Mental Status: She is alert and oriented to person, place, and time. Mental status is at baseline.     Cranial Nerves: No cranial nerve deficit.     Coordination: Coordination normal.   Psychiatric:        Behavior: Behavior normal.        Thought Content: Thought content normal.    (all labs ordered are listed, but only abnormal results are displayed) Labs Reviewed - No data to display  EKG: None  Radiology: No results found.   SABRASplint Application  Date/Time: 02/13/2024 4:33 PM  Performed by: Charlene Debby BROCKS, PA-C Authorized by: Charlene Debby BROCKS, PA-C   Consent:    Consent obtained:  Verbal   Consent given by:  Patient Pre-procedure details:    Distal neurologic exam:  Normal   Distal perfusion: distal pulses strong   Procedure details:    Location:  Wrist   Wrist location:  R wrist   Cast type:  Long arm   Splint type:  Sugar tong and radial gutter   Supplies:  Fiberglass, cotton padding and elastic bandage   Attestation: Splint applied and adjusted personally by me   Post-procedure details:    Distal neurologic exam:  Normal   Distal perfusion: distal pulses strong     Procedure completion:  Tolerated    Medications Ordered in the ED - No data to display                                  Medical Decision Making Amount and/or Complexity of Data Reviewed Radiology: ordered.   73 year old with right distal radius and distal ulna fracture.  Distal radius fracture intra-articular with very minimal displacement.  Fracture appears to be stable.  She is placed into a sugar-tong splint with a thumb spica component with a little bit of ulnar deviation.  She will  continue with sling for comfort.  She will continue with tramadol  Tylenol  for pain.  She will call orthopedic the office on Monday to schedule follow-up appoint.  She understands signs symptoms return to the ER for.  Final diagnoses:  None    ED Discharge Orders  None          Charlene Debby JAYSON DEVONNA 02/13/24 1634    Waymond Lorelle Cummins, MD 02/13/24 [phone]

## 2024-02-13 NOTE — ED Notes (Signed)
 Swelling and pain to R lateral wrist. Cannot spread fingers due to pain. Is moving fingers. Radial pulse +2.

## 2024-02-13 NOTE — ED Notes (Signed)
 Pt ambulatory to bathroom

## 2024-02-14 DIAGNOSIS — R2231 Localized swelling, mass and lump, right upper limb: Secondary | ICD-10-CM | POA: Diagnosis not present

## 2024-02-15 DIAGNOSIS — G4733 Obstructive sleep apnea (adult) (pediatric): Secondary | ICD-10-CM | POA: Diagnosis not present

## 2024-02-17 ENCOUNTER — Telehealth: Payer: Self-pay | Admitting: Internal Medicine

## 2024-02-17 NOTE — Telephone Encounter (Signed)
 Left vm and sent mychart message to confirm 02/24/24 appointment-Toni

## 2024-02-18 DIAGNOSIS — S52501A Unspecified fracture of the lower end of right radius, initial encounter for closed fracture: Secondary | ICD-10-CM | POA: Diagnosis not present

## 2024-02-18 DIAGNOSIS — M25531 Pain in right wrist: Secondary | ICD-10-CM | POA: Diagnosis not present

## 2024-02-18 DIAGNOSIS — E669 Obesity, unspecified: Secondary | ICD-10-CM | POA: Diagnosis not present

## 2024-02-24 ENCOUNTER — Ambulatory Visit (INDEPENDENT_AMBULATORY_CARE_PROVIDER_SITE_OTHER): Admitting: Internal Medicine

## 2024-02-24 DIAGNOSIS — R0602 Shortness of breath: Secondary | ICD-10-CM | POA: Diagnosis not present

## 2024-02-28 DIAGNOSIS — J449 Chronic obstructive pulmonary disease, unspecified: Secondary | ICD-10-CM | POA: Diagnosis not present

## 2024-02-28 NOTE — Procedures (Signed)
 [facility] [address] [place], [address]    Complete Pulmonary Function Testing Interpretation:  FINDINGS:  Forced vital capacity is normal.  FEV1 is normal.  FEV1 FVC ratio was normal.  Postbronchodilator there was no significant change in the FEV1 clinical improvement may occur.  Total lung capacity is normal.  Residual volume is normal.  Residual in total lung capacity ratio is normal.  FRC is normal.  DLCO is normal.  IMPRESSION:  This pulmonary function study is within normal limits clinical correlation is recommended  Regina DELENA Bathe, MD [facility] Pulmonary Critical Care Medicine Sleep Medicine

## 2024-03-03 ENCOUNTER — Ambulatory Visit: Admitting: Nurse Practitioner

## 2024-03-03 DIAGNOSIS — S52501D Unspecified fracture of the lower end of right radius, subsequent encounter for closed fracture with routine healing: Secondary | ICD-10-CM | POA: Diagnosis not present

## 2024-03-04 ENCOUNTER — Telehealth: Payer: Self-pay | Admitting: Internal Medicine

## 2024-03-04 ENCOUNTER — Telehealth: Payer: Self-pay | Admitting: Physician Assistant

## 2024-03-04 NOTE — Telephone Encounter (Signed)
 Received 02/15/2024 cpap supply order from [facility]. Gave to DSK for signature-Toni

## 2024-03-04 NOTE — Telephone Encounter (Signed)
 error

## 2024-03-07 ENCOUNTER — Telehealth: Payer: Self-pay | Admitting: Internal Medicine

## 2024-03-07 NOTE — Telephone Encounter (Signed)
 02/15/2024 cpap supply order signed. Put in [facility] folder to be p/u. Scanned-Toni

## 2024-03-08 ENCOUNTER — Encounter: Payer: Self-pay | Admitting: Internal Medicine

## 2024-03-08 ENCOUNTER — Ambulatory Visit (INDEPENDENT_AMBULATORY_CARE_PROVIDER_SITE_OTHER): Admitting: Internal Medicine

## 2024-03-08 VITALS — BP 124/63 | HR 73 | Temp 98.5°F | Resp 16 | Ht 65.0 in | Wt 247.8 lb

## 2024-03-08 DIAGNOSIS — G4733 Obstructive sleep apnea (adult) (pediatric): Secondary | ICD-10-CM

## 2024-03-08 DIAGNOSIS — J449 Chronic obstructive pulmonary disease, unspecified: Secondary | ICD-10-CM | POA: Diagnosis not present

## 2024-03-08 DIAGNOSIS — Z7189 Other specified counseling: Secondary | ICD-10-CM | POA: Diagnosis not present

## 2024-03-08 LAB — PULMONARY FUNCTION TEST

## 2024-03-08 NOTE — Patient Instructions (Signed)
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
## 2024-03-16 DIAGNOSIS — G4733 Obstructive sleep apnea (adult) (pediatric): Secondary | ICD-10-CM | POA: Diagnosis not present

## 2024-03-17 DIAGNOSIS — S52501D Unspecified fracture of the lower end of right radius, subsequent encounter for closed fracture with routine healing: Secondary | ICD-10-CM | POA: Diagnosis not present

## 2024-03-23 DIAGNOSIS — M65331 Trigger finger, right middle finger: Secondary | ICD-10-CM | POA: Diagnosis not present

## 2024-03-23 DIAGNOSIS — E669 Obesity, unspecified: Secondary | ICD-10-CM | POA: Diagnosis not present

## 2024-03-30 DIAGNOSIS — J449 Chronic obstructive pulmonary disease, unspecified: Secondary | ICD-10-CM | POA: Diagnosis not present

## 2024-04-06 DIAGNOSIS — E669 Obesity, unspecified: Secondary | ICD-10-CM | POA: Diagnosis not present

## 2024-04-06 DIAGNOSIS — M65331 Trigger finger, right middle finger: Secondary | ICD-10-CM | POA: Diagnosis not present

## 2024-04-06 DIAGNOSIS — M654 Radial styloid tenosynovitis [de Quervain]: Secondary | ICD-10-CM | POA: Diagnosis not present

## 2024-04-16 DIAGNOSIS — G4733 Obstructive sleep apnea (adult) (pediatric): Secondary | ICD-10-CM | POA: Diagnosis not present

## 2024-04-21 DIAGNOSIS — E039 Hypothyroidism, unspecified: Secondary | ICD-10-CM | POA: Diagnosis not present

## 2024-04-30 DIAGNOSIS — J449 Chronic obstructive pulmonary disease, unspecified: Secondary | ICD-10-CM | POA: Diagnosis not present

## 2024-05-17 DIAGNOSIS — G4733 Obstructive sleep apnea (adult) (pediatric): Secondary | ICD-10-CM | POA: Diagnosis not present

## 2024-05-18 ENCOUNTER — Ambulatory Visit: Attending: Nurse Practitioner | Admitting: Nurse Practitioner

## 2024-05-18 ENCOUNTER — Encounter: Payer: Self-pay | Admitting: Nurse Practitioner

## 2024-05-18 VITALS — BP 151/76 | HR 74 | Temp 97.9°F | Resp 18 | Ht 65.0 in | Wt 254.0 lb

## 2024-05-18 DIAGNOSIS — Z79899 Other long term (current) drug therapy: Secondary | ICD-10-CM

## 2024-05-18 DIAGNOSIS — G4486 Cervicogenic headache: Secondary | ICD-10-CM | POA: Insufficient documentation

## 2024-05-18 DIAGNOSIS — G894 Chronic pain syndrome: Secondary | ICD-10-CM | POA: Insufficient documentation

## 2024-05-18 DIAGNOSIS — Z79891 Long term (current) use of opiate analgesic: Secondary | ICD-10-CM | POA: Diagnosis not present

## 2024-05-18 DIAGNOSIS — M47816 Spondylosis without myelopathy or radiculopathy, lumbar region: Secondary | ICD-10-CM | POA: Diagnosis not present

## 2024-05-18 DIAGNOSIS — M542 Cervicalgia: Secondary | ICD-10-CM | POA: Diagnosis not present

## 2024-05-18 DIAGNOSIS — M545 Low back pain, unspecified: Secondary | ICD-10-CM | POA: Diagnosis not present

## 2024-05-18 DIAGNOSIS — R0789 Other chest pain: Secondary | ICD-10-CM

## 2024-05-18 DIAGNOSIS — G8929 Other chronic pain: Secondary | ICD-10-CM | POA: Insufficient documentation

## 2024-05-18 MED ORDER — TRAMADOL HCL 50 MG PO TABS: 50.0000 mg | ORAL_TABLET | Freq: Four times a day (QID) | ORAL | 3 refills | Status: DC | PRN
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
## 2024-05-18 NOTE — Progress Notes (Signed)
 Nursing Pain Medication Assessment:  Safety precautions to be maintained throughout the outpatient stay will include: orient to surroundings, keep bed in low position, maintain call bell within reach at all times, provide assistance with transfer out of bed and ambulation.  Medication Inspection Compliance: Regina Campos did not comply with our request to bring her pills to be counted. She was reminded that bringing the medication bottles, even when empty, is a requirement.  Medication: None brought in. Pill/Patch Count: None available to be counted. Bottle Appearance: No container available. Did not bring bottle(s) to appointment. Filled Date: N/A Last Medication intake:  TodaySafety precautions to be maintained throughout the outpatient stay will include: orient to surroundings, keep bed in low position, maintain call bell within reach at all times, provide assistance with transfer out of bed and ambulation.

## 2024-05-23 LAB — TOXASSURE SELECT 13 (MW), URINE

## 2024-05-27 ENCOUNTER — Other Ambulatory Visit: Payer: Self-pay | Admitting: Nurse Practitioner

## 2024-05-30 DIAGNOSIS — J449 Chronic obstructive pulmonary disease, unspecified: Secondary | ICD-10-CM | POA: Diagnosis not present

## 2024-06-06 ENCOUNTER — Ambulatory Visit (INDEPENDENT_AMBULATORY_CARE_PROVIDER_SITE_OTHER): Admitting: Internal Medicine

## 2024-06-06 ENCOUNTER — Encounter: Payer: Self-pay | Admitting: Internal Medicine

## 2024-06-06 VITALS — BP 141/90 | HR 73 | Temp 98.3°F | Resp 16 | Ht 65.0 in | Wt 252.0 lb

## 2024-06-06 DIAGNOSIS — J449 Chronic obstructive pulmonary disease, unspecified: Secondary | ICD-10-CM | POA: Diagnosis not present

## 2024-06-06 DIAGNOSIS — Z7189 Other specified counseling: Secondary | ICD-10-CM | POA: Diagnosis not present

## 2024-06-06 DIAGNOSIS — G4733 Obstructive sleep apnea (adult) (pediatric): Secondary | ICD-10-CM | POA: Diagnosis not present

## 2024-06-06 DIAGNOSIS — J438 Other emphysema: Secondary | ICD-10-CM | POA: Diagnosis not present

## 2024-06-06 MED ORDER — TIOTROPIUM BROMIDE 18 MCG IN CAPS: 18.0000 ug | ORAL_CAPSULE | Freq: Every day | RESPIRATORY_TRACT | 3 refills | Status: AC

## 2024-06-06 MED ORDER — ALBUTEROL SULFATE HFA 108 (90 BASE) MCG/ACT IN AERS
2.0000 | INHALATION_SPRAY | Freq: Four times a day (QID) | RESPIRATORY_TRACT | 3 refills | Status: AC | PRN
Start: 2024-06-06 — End: ?

## 2024-06-06 MED ORDER — TIOTROPIUM BROMIDE 18 MCG IN CAPS: 18.0000 ug | ORAL_CAPSULE | Freq: Every day | RESPIRATORY_TRACT | 3 refills | Status: DC

## 2024-06-06 NOTE — Patient Instructions (Signed)
 Chronic Obstructive Pulmonary Disease  Chronic obstructive pulmonary disease (COPD) is a long-term (chronic) lung problem. When you have COPD, it can feel harder to breathe in or out. The condition may get worse over time. There are things you can do to keep yourself as healthy as possible. What are the causes? Smoking. This is the most common cause. Breathing in fumes, smoke, or chemicals for a long time. Genes that are inherited, which means they are passed down from parent to child. What are the signs or symptoms? Shortness of breath. This may happen all the time. This may get worse when you move your body. This may get worse over time. You may have times when this becomes much worse all of a sudden. These are called flare-ups or exacerbations. A long-term cough, with or without thick mucus. Wheezing. Chest tightness. Feeling tired. Not being able to do activities like you used to do. How is this diagnosed? This condition is diagnosed based on: Your medical history. A physical exam. Lung (pulmonary) function tests. You may have a test that measures the air flow out of the lungs when you breathe out. You may also have tests, including: Chest X-ray. CT scan. Blood tests. How is this treated? This condition may be treated by: Quitting smoking, if you smoke. Using oxygen. Taking medicines. These may include: Inhalers. These have medicines in them that you breathe in. Daily inhalers. These help to prevent symptoms from happening. They are usually taken every day to prevent COPD flare-ups. Quick relief inhalers. These act fast to relieve symptoms. They are used only when needed and provide short-term relief. Other medicines that you breathe in or swallow. These may be used to open the airways, thin mucus, or treat infections. Breathing exercises to help you control or catch your breath. A mucus clearing device, if you have a lot of thick mucus. Pulmonary rehab. A place where you  will learn about your condition and the best ways for you to manage it. Surgery. Follow these instructions at home: Medicines Take your medicines as told by your health care provider. Talk to your provider before taking any cough or allergy medicines. You may need to avoid medicines that cause your lungs to be dry. Lifestyle Several times a day, wash your hands with soap and water for at least 20 seconds. If you cannot use soap and water, use hand sanitizer. This may help keep you from getting an infection. Avoid being around crowds or people who are sick. Do not smoke or use any products that contain nicotine or tobacco. If you need help quitting, ask your provider. Stay active. Learn how to pace your activity during the day. Learn how to breathe to control your stress and catch your breath. Drink enough fluid to keep your pee (urine) pale yellow, unless you have been told not to. Eat healthy foods. Eat smaller meals more often. Get enough sleep. Most adults need 7 or more hours per night. General instructions Make a COPD action plan with your provider. This helps you to know what to do if you feel worse than usual. Make sure you get all the shots, also called vaccines, that your provider recommends. Ask your provider about a flu shot and a pneumonia shot. If you need home oxygen therapy, ask your provider how often to check your oxygen level with a device called an oximeter. Keep all follow-up visits to review your COPD action plan. Your provider will want to check on your condition often to keep  you healthy and out of the hospital. Contact a health care provider if: You are coughing up more mucus than usual. There is a change in the color or thickness of the mucus. It is harder to breathe than usual or you are short of breath while you are resting. You need to use your quick relief inhaler more often. You have trouble doing your normal activities such as getting dressed or walking in  the house. Your skin color or fingernails turn blue. You have a fever or chills. Get help right away if: You are short of breath and cannot: Talk in full sentences. Do normal activities. You have chest pain. You feel confused. These symptoms may be an emergency. Call 911 right away. Do not wait to see if the symptoms will go away. Do not drive yourself to the hospital. This information is not intended to replace advice given to you by your health care provider. Make sure you discuss any questions you have with your health care provider. Document Revised: 05/07/2023 Document Reviewed: 10/20/2022 Elsevier Patient Education  2024 [employer]Sleep Apnea  Sleep apnea is a condition that affects your breathing while you are sleeping. Your tongue or soft tissue in your throat may block the flow of air while you sleep. You may have shallow breathing or stop breathing for short periods of time. People with sleep apnea may snore loudly. There are three kinds of sleep apnea: Obstructive sleep apnea. This kind is caused by a blocked or collapsed airway. This is the most common. Central sleep apnea. This kind happens when the part of the brain that controls breathing does not send the correct signals to the muscles that control breathing. Mixed sleep apnea. This is a combination of obstructive and central sleep apnea. What are the causes? The most common cause of sleep apnea is a collapsed or blocked airway. What increases the risk? Being very overweight. Having family members with sleep apnea. Having a tongue or tonsils that are larger than normal. Having a small airway or jaw problems. Being older. What are the signs or symptoms? Loud snoring. Restless sleep. Trouble staying asleep. Being sleepy or tired during the day. Waking up gasping or choking. Having a headache in the morning. Mood swings. Having a hard time remembering things and concentrating. How is this diagnosed? A medical  history. A physical exam. A sleep study. This is also called a polysomnography test. This test is done at a sleep lab or in your home while you are sleeping. How is this treated? Treatment may include: Sleeping on your side. Losing weight if you're overweight. Wearing an oral appliance. This is a mouthpiece that moves your lower jaw forward. Using a positive airway pressure (PAP) device to keep your airways open while you sleep, such as: A continuous positive airway pressure (CPAP) device. This device gives forced air through a mask when you breathe out. This keeps your airways open. A bilevel positive airway pressure (BIPAP) device. This device gives forced air through a mask when you breathe in and when you breathe out to keep your airways open. Having surgery if other treatments do not work. If your sleep apnea is not treated, you may be at risk for: Heart failure. Heart attack. Stroke. Type 2 diabetes or a problem with your blood sugar called insulin resistance. Follow these instructions at home: Medicines Take your medicines only as told by your health care provider. Avoid alcohol, medicines to help you relax, and certain pain medicines. These may make  sleep apnea worse. General instructions Do not smoke, vape, or use products with nicotine or tobacco in them. If you need help quitting, talk with your provider. If you were given a PAP device to open your airway while you sleep, use it as told by your provider. If you're having surgery, make sure to tell your provider you have sleep apnea. You may need to bring your PAP device with you. Contact a health care provider if: The PAP device that you were given to use during sleep bothers you or does not seem to be working. You do not feel better or you feel worse. Get help right away if: You have trouble breathing. You have chest pain. You have trouble talking. One side of your body feels weak. A part of your face is hanging  down. These symptoms may be an emergency. Call 911 right away. Do not wait to see if the symptoms will go away. Do not drive yourself to the hospital. This information is not intended to replace advice given to you by your health care provider. Make sure you discuss any questions you have with your health care provider. Document Revised: 05/07/2023 Document Reviewed: 10/09/2022 Elsevier Patient Education  2024 [employer].
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
## 2024-06-07 DIAGNOSIS — J449 Chronic obstructive pulmonary disease, unspecified: Secondary | ICD-10-CM | POA: Diagnosis not present

## 2024-06-08 DIAGNOSIS — Z1331 Encounter for screening for depression: Secondary | ICD-10-CM | POA: Diagnosis not present

## 2024-06-08 DIAGNOSIS — E039 Hypothyroidism, unspecified: Secondary | ICD-10-CM | POA: Diagnosis not present

## 2024-06-16 DIAGNOSIS — Z6841 Body Mass Index (BMI) 40.0 and over, adult: Secondary | ICD-10-CM | POA: Diagnosis not present

## 2024-06-16 DIAGNOSIS — M19031 Primary osteoarthritis, right wrist: Secondary | ICD-10-CM | POA: Diagnosis not present

## 2024-06-16 DIAGNOSIS — G8929 Other chronic pain: Secondary | ICD-10-CM | POA: Diagnosis not present

## 2024-06-16 DIAGNOSIS — M1811 Unilateral primary osteoarthritis of first carpometacarpal joint, right hand: Secondary | ICD-10-CM | POA: Diagnosis not present

## 2024-06-16 DIAGNOSIS — G4733 Obstructive sleep apnea (adult) (pediatric): Secondary | ICD-10-CM | POA: Diagnosis not present

## 2024-06-16 DIAGNOSIS — M25531 Pain in right wrist: Secondary | ICD-10-CM | POA: Diagnosis not present

## 2024-06-18 ENCOUNTER — Other Ambulatory Visit: Payer: Self-pay | Admitting: Pain Medicine

## 2024-06-18 DIAGNOSIS — M542 Cervicalgia: Secondary | ICD-10-CM

## 2024-06-18 DIAGNOSIS — Z79891 Long term (current) use of opiate analgesic: Secondary | ICD-10-CM

## 2024-06-18 DIAGNOSIS — G4486 Cervicogenic headache: Secondary | ICD-10-CM

## 2024-06-18 DIAGNOSIS — G894 Chronic pain syndrome: Secondary | ICD-10-CM

## 2024-06-30 DIAGNOSIS — J449 Chronic obstructive pulmonary disease, unspecified: Secondary | ICD-10-CM | POA: Diagnosis not present

## 2024-07-17 DIAGNOSIS — G4733 Obstructive sleep apnea (adult) (pediatric): Secondary | ICD-10-CM | POA: Diagnosis not present

## 2024-07-21 DIAGNOSIS — E039 Hypothyroidism, unspecified: Secondary | ICD-10-CM | POA: Diagnosis not present

## 2024-07-28 ENCOUNTER — Telehealth: Payer: Self-pay | Admitting: Physician Assistant

## 2024-07-28 NOTE — Telephone Encounter (Signed)
 Patient called regarding cpap supplies. I explained we have not received any orders to be signed from [facility]. I sent message to [facility]

## 2024-07-30 DIAGNOSIS — J449 Chronic obstructive pulmonary disease, unspecified: Secondary | ICD-10-CM | POA: Diagnosis not present

## 2024-08-05 NOTE — Telephone Encounter (Signed)
 Office notes and compliance download faxed to [facility]; [phone]

## 2024-08-14 NOTE — Progress Notes (Unsigned)
 PROVIDER NOTE: Interpretation of information contained herein should be left to medically-trained personnel. Specific patient instructions are provided elsewhere under Patient Instructions section of medical record. This document was created in part using AI and STT-dictation technology, any transcriptional errors that may result from this process are unintentional.  Patient: Regina Campos  Service: E/M   PCP: Gretta Comer POUR, NP  DOB: 05-Oct-1961  DOS: 08/16/2024  Provider: Emmy POUR Blanch, NP  MRN: [id number]  Delivery: Face-to-face  Specialty: Interventional Pain Management  Type: Established Patient  Setting: Ambulatory outpatient facility  Specialty designation: 09  Referring Prov.: Gretta Comer POUR, NP  Location: Outpatient office facility       History of present illness (HPI) Regina Campos, a 73 y.o. year old female, is here today because of his Chronic pain syndrome [G89.4]. Regina Campos primary complain today is No chief complaint on file.  Pertinent problems: Regina Campos has Essential hypertension; Type 2 diabetes mellitus (HCC); GERD (gastroesophageal reflux disease); Acute shoulder pain due to trauma, left; Weakness of both lower extremities; Pain in both lower extremities; Chronic pain syndrome; and Pain management contract signed on their pertinent problem list.  Pain Assessment: Severity of   is reported as a  /10. Location:    / . Onset:  . Quality:  . Timing:  . Modifying factor(s):  SABRA Vitals:  vitals were not taken for this visit.  BMI: Estimated body mass index is 22.68 kg/m as calculated from the following:   Height as of 06/14/24: 5' 9 (1.753 m).   Weight as of 06/14/24: 153 lb 9.6 oz (69.7 kg).  Last encounter: 05/24/2024. Last procedure: Visit date not found.  Reason for encounter: {Blank single:19197::medication management,post-procedure evaluation and assessment,both, medication management and post-procedure evaluation and assessment,evaluation of  worsening, or previously known (established) problem,patient-requested evaluation,follow-up evaluation,evaluation for possible interventional PM therapy/treatment,evaluation of new problem, *** }.   Discussed the use of AI scribe software for clinical note transcription with the patient, who gave verbal consent to proceed.  History of Present Illness           Pharmacotherapy Assessment   Hydrocodone  acetaminophen  (Norco) 7.5-325 mg tablet every 8 hours as needed for pain. MME=22.50 Monitoring: [place] PMP: PDMP reviewed during this encounter.       Pharmacotherapy: No side-effects or adverse reactions reported. Compliance: No problems identified. Effectiveness: Clinically acceptable.  Shela Reda CROME, RN  05/24/2024 10:24 AM  Sign when Signing Visit Nursing Pain Medication Assessment:  Safety precautions to be maintained throughout the outpatient stay will include: orient to surroundings, keep bed in low position, maintain call bell within reach at all times, provide assistance with transfer out of bed and ambulation.  Medication Inspection Compliance: Pill count conducted under aseptic conditions, in front of the patient. Neither the pills nor the bottle was removed from the patient's sight at any time. Once count was completed pills were immediately returned to the patient in their original bottle.  Medication: Hydrocodone /APAP Pill/Patch Count: 27 of 90 pills/patches remain Pill/Patch Appearance: Markings consistent with prescribed medication Bottle Appearance: Standard pharmacy container. Clearly labeled. Filled Date: 09 / 18 / 2025 Last Medication intake:  YesterdaySafety precautions to be maintained throughout the outpatient stay will include: orient to surroundings, keep bed in low position, maintain call bell within reach at all times, provide assistance with transfer out of bed and ambulation.     UDS:  Summary  Date Value Ref Range Status  05/19/2024 FINAL  Final     Comment:    ====================================================================  [employer] (MW) ==================================================================== Test                             Result       Flag       Units  Drug Present and Declared for Prescription Verification   Hydrocodone                     954          EXPECTED   ng/mg creat   Hydromorphone                   99           EXPECTED   ng/mg creat   Dihydrocodeine                 49           EXPECTED   ng/mg creat   Norhydrocodone                 1351         EXPECTED   ng/mg creat    Sources of hydrocodone  include scheduled prescription medications.    Hydromorphone , dihydrocodeine and norhydrocodone are expected    metabolites of hydrocodone . Hydromorphone  and dihydrocodeine are    also available as scheduled prescription medications.  ==================================================================== Test                      Result    Flag   Units      Ref Range   Creatinine              287              mg/dL      >=79 ==================================================================== Declared Medications:  The flagging and interpretation on this report are based on the  following declared medications.  Unexpected results may arise from  inaccuracies in the declared medications.   **Note: The testing scope of this panel includes these medications:   Hydrocodone    **Note: The testing scope of this panel does not include the  following reported medications:   Acetaminophen   Amitriptyline   Benazepril   Gabapentin   Glipizide  (Glucotrol )  Insulin  (Lantus )  Multivitamin  Omeprazole  (Prilosec)  Rosuvastatin  (Crestor )  Sennosides ==================================================================== For clinical consultation, please call [phone]. ====================================================================     No results found for: CBDTHCR No results found for:  D8THCCBX No results found for: D9THCCBX  ROS  Constitutional: Denies any fever or chills Gastrointestinal: No reported hemesis, hematochezia, vomiting, or acute GI distress Musculoskeletal: Bilateral leg pain Neurological: No reported episodes of acute onset apraxia, aphasia, dysarthria, agnosia, amnesia, paralysis, loss of coordination, or loss of consciousness  Medication Review  Dexcom G7 Receiver, Dexcom G7 Sensor, HYDROcodone -acetaminophen , Immune Globulin (Human)-stwk, Pen Needles, Sennosides, benazepril , blood glucose meter kit and supplies, gabapentin , glipiZIDE , glucose blood, insulin  glargine, magnesium citrate, multivitamin with minerals, omeprazole , and onetouch ultrasoft  History Review  Allergy: Regina Campos is allergic to lipitor [atorvastatin ] and metformin  and related. Drug: Regina Campos  reports no history of drug use. Alcohol:  reports no history of alcohol use. Tobacco:  reports that he has never smoked. His smokeless tobacco use includes chew. Social: Regina Campos  reports that he has never smoked. His smokeless tobacco use includes chew. He reports that he does not drink alcohol and does not use drugs. Medical:  has a past medical history of Essential hypertension, Hyperlipidemia, Kidney  stones, and Type 2 diabetes mellitus (HCC). Surgical: Regina Campos  has a past surgical history that includes Cholecystectomy. Family: family history is not on file. He was adopted.  Laboratory Chemistry Profile   Renal Lab Results  Component Value Date   BUN 37 (H) 08/20/2023   CREATININE 1.51 (H) 08/20/2023   GFR 47.73 (L) 07/02/2023   GFRAA >60 04/27/2018   GFRNONAA 52 (L) 08/20/2023    Hepatic Lab Results  Component Value Date   AST 18 08/20/2023   ALT 23 08/20/2023   ALBUMIN 3.7 01/19/2024   ALKPHOS 89 08/20/2023   LIPASE 41 08/20/2023    Electrolytes Lab Results  Component Value Date   NA 130 (L) 08/20/2023   K 4.6 08/20/2023   CL 96 (L) 08/20/2023    CALCIUM  8.6 (L) 08/20/2023   MG 2.0 08/20/2023    Bone No results found for: VD25OH, CI874NY7UNU, CI6874NY7, CI7874NY7, 25OHVITD1, 25OHVITD2, 25OHVITD3, TESTOFREE, TESTOSTERONE  Inflammation (CRP: Acute Phase) (ESR: Chronic Phase) Lab Results  Component Value Date   LATICACIDVEN 2.9 (HH) 08/06/2023         Note: Above Lab results reviewed.  Recent Imaging Review  DG FL GUIDED LUMBAR PUNCTURE CLINICAL DATA:  73 year old female. History of bilateral lower extremity weakness. Team is requesting lumbar puncture for further evaluation  EXAM: LUMBAR PUNCTURE UNDER FLUOROSCOPY  PROCEDURE: An appropriate skin entry site was determined fluoroscopically. Operator donned sterile gloves and mask. Skin site was marked, then prepped with Betadine, draped in usual sterile fashion, and infiltrated locally with 1% lidocaine. A 20 gauge spinal needle advanced into the thecal sac at L4-5 from a left interlaminar approach.  Clear colorless CSF spontaneously returned, with opening pressure of 14.6 cm water. Fifteen ml CSF were collected and divided among 4 sterile vials for the requested laboratory studies.  The needle was then removed. The patient tolerated the procedure well and there were no complications.  FLUOROSCOPY: Radiation Exposure Index (as provided by the fluoroscopic device): 2.7 mGy Kerma  IMPRESSION: Technically successful lumbar puncture under fluoroscopy.  This exam was performed by Delon Beagle NP, and was supervised and interpreted by Dr. JONETTA. Toribio Faes.  Electronically Signed   By: JONETTA Faes M.D.   On: 01/19/2024 13:42 Note: Reviewed        Physical Exam  Vitals: BP 106/83 (Cuff Size: Normal)   Pulse 99   Temp (!) 97.4 F (36.3 C) (Temporal)   Resp 18   Ht 5' 9 (1.753 m)   Wt 152 lb (68.9 kg)   SpO2 100%   BMI 22.45 kg/m  BMI: Estimated body mass index is 22.45 kg/m as calculated from the following:   Height as of this  encounter: 5' 9 (1.753 m).   Weight as of this encounter: 152 lb (68.9 kg). Ideal: Ideal body weight: 70.7 kg (155 lb 13.8 oz) General appearance: Well nourished, well developed, and well hydrated. In no apparent acute distress Mental status: Alert, oriented x 3 (person, place, & time)       Respiratory: No evidence of acute respiratory distress Eyes: PERLA  Musculoskeletal: Bilateral leg pain Assessment   Diagnosis Status  1. Chronic pain syndrome   2. Medication management   3. CIDP (chronic inflammatory demyelinating polyneuropathy) (HCC)   4. Type 2 diabetes mellitus with diabetic polyneuropathy, with long-term current use of insulin  (HCC)   5. Pain management contract signed    Controlled Controlled Controlled   Updated Problems: No problems updated.  Plan of Care  Problem-specific:  Assessment and Plan Chronic pain syndrome/medication management: Patient's pain is well-controlled with hydrocodone  7.5-325 mg, will continue on current medication regimen.  Prescribing drug monitoring (PDMP) reviewed, findings consistent with the use of prescribed medication and no evidence of narcotic misuse or abuse.  Urine drug screening (UDS) up to date and consistent with the use of medication.  Advised patient to walk at least 30-minute to improve functional ability.  Advised to drink more water to prevent from opioid-induced constipation.  Schedule follow-up in 90 days for medication management.  Mr. Regina Campos has a current medication list which includes the following long-term medication(s): benazepril , gabapentin , glucose blood, onetouch ultrasoft, lantus  solostar, omeprazole , and glipizide .  Pharmacotherapy (Medications Ordered): Meds ordered this encounter  Medications   HYDROcodone -acetaminophen  (NORCO) 7.5-325 MG tablet    Sig: Take 1 tablet by mouth every 8 (eight) hours as needed for moderate pain (pain score 4-6).    Dispense:  90 tablet    Refill:  0    HYDROcodone -acetaminophen  (NORCO) 7.5-325 MG tablet    Sig: Take 1 tablet by mouth every 8 (eight) hours as needed for moderate pain (pain score 4-6).    Dispense:  90 tablet    Refill:  0   HYDROcodone -acetaminophen  (NORCO) 7.5-325 MG tablet    Sig: Take 1 tablet by mouth every 8 (eight) hours as needed for moderate pain (pain score 4-6).    Dispense:  90 tablet    Refill:  0   Orders:  No orders of the defined types were placed in this encounter.   Monitoring: [place] PMP: PDMP not reviewed this encounter. {Blank single:19197::Unable to conduct review of the controlled substance reporting system due to technological failure.,     } Pharmacotherapy: {Blank single:19197::Opioid-induced constipation (OIC)(K59.03, T40.2X5A),No side-effects or adverse reactions reported.} Compliance: {Blank single:19197::Medication agreement violation - unsanctioned acquisition/use of additional opioid-containing medication,No problems identified.} Effectiveness: {Blank single:19197::Clinically acceptable.}  No notes on file  UDS:  Summary  Date Value Ref Range Status  05/19/2024 FINAL  Final    Comment:    ==================================================================== [employer] (MW) ==================================================================== Test                             Result       Flag       Units  Drug Present and Declared for Prescription Verification   Hydrocodone                     954          EXPECTED   ng/mg creat   Hydromorphone                   99           EXPECTED   ng/mg creat   Dihydrocodeine                 49           EXPECTED   ng/mg creat   Norhydrocodone                 1351         EXPECTED   ng/mg creat    Sources of hydrocodone  include scheduled prescription medications.    Hydromorphone , dihydrocodeine and norhydrocodone are expected    metabolites of hydrocodone . Hydromorphone  and dihydrocodeine are    also available as scheduled  prescription medications.  ==================================================================== Test  Result    Flag   Units      Ref Range   Creatinine              287              mg/dL      >=79 ==================================================================== Declared Medications:  The flagging and interpretation on this report are based on the  following declared medications.  Unexpected results may arise from  inaccuracies in the declared medications.   **Note: The testing scope of this panel includes these medications:   Hydrocodone    **Note: The testing scope of this panel does not include the  following reported medications:   Acetaminophen   Amitriptyline   Benazepril   Gabapentin   Glipizide  (Glucotrol )  Insulin  (Lantus )  Multivitamin  Omeprazole  (Prilosec)  Rosuvastatin  (Crestor )  Sennosides ==================================================================== For clinical consultation, please call [phone]. ====================================================================     No results found for: CBDTHCR No results found for: D8THCCBX No results found for: D9THCCBX  ROS  Constitutional: {Blank single:19197::Denies any fever or chills} Gastrointestinal: {Blank single:19197::No reported hemesis, hematochezia, vomiting, or acute GI distress} Musculoskeletal: {Blank single:19197::Denies any acute onset joint swelling, redness, loss of ROM, or weakness} Neurological: {Blank single:19197::No reported episodes of acute onset apraxia, aphasia, dysarthria, agnosia, amnesia, paralysis, loss of coordination, or loss of consciousness}  Medication Review  Dexcom G7 Receiver, Dexcom G7 Sensor, HYDROcodone -acetaminophen , Immune Globulin (Human)-stwk, Pen Needles, Sennosides, benazepril , blood glucose meter kit and supplies, gabapentin , glipiZIDE , insulin  glargine, magnesium citrate, multivitamin with minerals, omeprazole , and  onetouch ultrasoft  History Review  Allergy: Mr. Regina Campos is allergic to lipitor [atorvastatin ] and metformin  and related. Drug: Regina Campos  reports no history of drug use. Alcohol:  reports no history of alcohol use. Tobacco:  reports that he has never smoked. His smokeless tobacco use includes chew. Social: Mr. Regina Campos  reports that he has never smoked. His smokeless tobacco use includes chew. He reports that he does not drink alcohol and does not use drugs. Medical:  has a past medical history of Essential hypertension, Hyperlipidemia, Kidney stones, and Type 2 diabetes mellitus (HCC). Surgical: Regina Campos  has a past surgical history that includes Cholecystectomy. Family: family history is not on file. He was adopted.  Laboratory Chemistry Profile   Renal Lab Results  Component Value Date   BUN 37 (H) 08/20/2023   CREATININE 1.51 (H) 08/20/2023   GFR 47.73 (L) 07/02/2023   GFRAA >60 04/27/2018   GFRNONAA 52 (L) 08/20/2023    Hepatic Lab Results  Component Value Date   AST 18 08/20/2023   ALT 23 08/20/2023   ALBUMIN 3.7 01/19/2024   ALKPHOS 89 08/20/2023   LIPASE 41 08/20/2023    Electrolytes Lab Results  Component Value Date   NA 130 (L) 08/20/2023   K 4.6 08/20/2023   CL 96 (L) 08/20/2023   CALCIUM  8.6 (L) 08/20/2023   MG 2.0 08/20/2023    Bone No results found for: VD25OH, CI874NY7UNU, CI6874NY7, CI7874NY7, 25OHVITD1, 25OHVITD2, 25OHVITD3, TESTOFREE, TESTOSTERONE  Inflammation (CRP: Acute Phase) (ESR: Chronic Phase) Lab Results  Component Value Date   LATICACIDVEN 2.9 (HH) 08/06/2023         Note: {Blank single:19197::Regina Campos indicates labs done and monitored by primary care practitioner using a non-[facility] EMR system,No results found under the [facility] electronic medical record,Patient noncompliant with Lab Work orders.,Results made available to patient.,Lab results reviewed and made available to patient.,Lab  results reviewed and explained to patient in Layman's terms.,Above Lab results reviewed.}  Recent Imaging Review  DG FL GUIDED LUMBAR PUNCTURE CLINICAL DATA:  73 year old female. History of bilateral lower extremity weakness. Team is requesting lumbar puncture for further evaluation  EXAM: LUMBAR PUNCTURE UNDER FLUOROSCOPY  PROCEDURE: An appropriate skin entry site was determined fluoroscopically. Operator donned sterile gloves and mask. Skin site was marked, then prepped with Betadine, draped in usual sterile fashion, and infiltrated locally with 1% lidocaine. A 20 gauge spinal needle advanced into the thecal sac at L4-5 from a left interlaminar approach.  Clear colorless CSF spontaneously returned, with opening pressure of 14.6 cm water. Fifteen ml CSF were collected and divided among 4 sterile vials for the requested laboratory studies.  The needle was then removed. The patient tolerated the procedure well and there were no complications.  FLUOROSCOPY: Radiation Exposure Index (as provided by the fluoroscopic device): 2.7 mGy Kerma  IMPRESSION: Technically successful lumbar puncture under fluoroscopy.  This exam was performed by Delon Beagle NP, and was supervised and interpreted by Dr. JONETTA. Toribio Faes.  Electronically Signed   By: JONETTA Faes M.D.   On: 01/19/2024 13:42 Note: {Blank single:19197::No new results found.,No results found under the [facility] electronic medical record.,Imaging results reviewed and explained to patient in Layman's terms.,Results of ordered imaging test(s) reviewed and explained to patient in Layman's terms.,Imaging results reviewed.,Reviewed} {Blank single:19197::Results visible under [facility].,Results visible under [facility].,Results visible under [facility].,Results visible under [facility].,Results visible under Care Everywhere.,Results made available to patient.,Copy of results provided to  patient.,Patient noncompliant with diagnostic imaging orders.,     }  Physical Exam  Vitals: There were no vitals taken for this visit. BMI: Estimated body mass index is 22.68 kg/m as calculated from the following:   Height as of 06/14/24: 5' 9 (1.753 m).   Weight as of 06/14/24: 153 lb 9.6 oz (69.7 kg). Ideal: Patient weight not recorded General appearance: {general exam:210120802::Well nourished, well developed, and well hydrated. In no apparent acute distress} Mental status: {Blank single:19197::Alert and oriented x 3. Exaggerated physical and/or psychosocial pain behavior perceived.,Alert, oriented x 3 (person, place, & time)} {Blank single:19197::Mr. Politano's speech pattern and demeanor seems to suggest oversedation,     } Respiratory: {Blank single:19197::Oxygen-dependent COPD,No evidence of acute respiratory distress} Eyes: {Blank single:19197::Miotic (pupilary constriction) due to opiate use,Midriatic,Anisocoric,Evidence of ptosis,Pin-point pupils,PERLA}   Assessment   Diagnosis Status  1. Chronic pain syndrome   2. Medication management   3. CIDP (chronic inflammatory demyelinating polyneuropathy) (HCC)   4. Type 2 diabetes mellitus with diabetic polyneuropathy, with long-term current use of insulin  (HCC)   5. Pain management contract signed    {Blank single:19197::Absent,Deteriorating,Having a Flare-up,Improved,Improving,Not improving,Not responding,Persistent,Present,Recurring,Reoccurring,Resolved,Responding,Stable,Unchanged,improved,Worsened,Worsening,Controlled} {Blank single:19197::Absent,Deteriorating,Having a Flare-up,Improved,Improving,Not improving,Not responding,Persistent,Present,Recurring,Reoccurring,Resolved,Responding,Stable,Unchanged,improved,Worsened,Worsening,Controlled} {Blank single:19197::Absent,Deteriorating,Having a  Flare-up,Improved,Improving,Not improving,Not responding,Persistent,Present,Recurring,Reoccurring,Resolved,Responding,Stable,Unchanged,improved,Worsened,Worsening,Controlled}   Updated Problems: No problems updated.  Plan of Care  Problem-specific:  Assessment and Plan            Mr. Antionne Enrique has a current medication list which includes the following long-term medication(s): benazepril , gabapentin , glipizide , lantus  solostar, onetouch ultrasoft, and omeprazole .  Pharmacotherapy (Medications Ordered): No orders of the defined types were placed in this encounter.  Orders:  No orders of the defined types were placed in this encounter.    {There is no content from the last Plan section.}   No follow-ups on file.    Recent Visits Date Type Provider Dept  05/24/24 Office Visit Leeann Bady K, NP [facility]  Showing recent visits within past 90 days and meeting all other requirements Future Appointments Date Type Provider Dept  08/16/24 Appointment Valaree Fresquez K, NP [facility]  Showing future appointments within next 90 days and meeting all other requirements  I discussed the assessment and treatment plan with the patient. The patient was provided an opportunity to ask questions and all were answered. The patient agreed with the plan and demonstrated an understanding of the instructions.  Patient advised to call back or seek an in-person evaluation if the symptoms or condition worsens.  Duration of encounter: *** minutes.  Total time on encounter, as per AMA guidelines included both the face-to-face and non-face-to-face time personally spent by the physician and/or other qualified health care professional(s) on the day of the encounter (includes time in activities that require the physician or other qualified health care professional and does not include time in activities normally performed by clinical staff). Physician's  time may include the following activities when performed: Preparing to see the patient (e.g., pre-charting review of records, searching for previously ordered imaging, lab work, and nerve conduction tests) Review of prior analgesic pharmacotherapies. Reviewing PMP Interpreting ordered tests (e.g., lab work, imaging, nerve conduction tests) Performing post-procedure evaluations, including interpretation of diagnostic procedures Obtaining and/or reviewing separately obtained history Performing a medically appropriate examination and/or evaluation Counseling and educating the patient/family/caregiver Ordering medications, tests, or procedures Referring and communicating with other health care professionals (when not separately reported) Documenting clinical information in the electronic or other health record Independently interpreting results (not separately reported) and communicating results to the patient/ family/caregiver Care coordination (not separately reported)  Note by: Rahm Minix K Danely Bayliss, NP (TTS and AI technology used. I apologize for any typographical errors that were not detected and corrected.) Date: 08/16/2024; Time: 1:25 PM

## 2024-08-16 ENCOUNTER — Ambulatory Visit (HOSPITAL_BASED_OUTPATIENT_CLINIC_OR_DEPARTMENT_OTHER): Admitting: Nurse Practitioner

## 2024-08-16 DIAGNOSIS — G894 Chronic pain syndrome: Secondary | ICD-10-CM

## 2024-08-16 DIAGNOSIS — M542 Cervicalgia: Secondary | ICD-10-CM

## 2024-08-16 DIAGNOSIS — Z79891 Long term (current) use of opiate analgesic: Secondary | ICD-10-CM

## 2024-08-16 DIAGNOSIS — G4486 Cervicogenic headache: Secondary | ICD-10-CM

## 2024-08-16 DIAGNOSIS — Z91199 Patient's noncompliance with other medical treatment and regimen due to unspecified reason: Secondary | ICD-10-CM

## 2024-08-23 NOTE — Progress Notes (Unsigned)
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    Recent Visits No visits were found meeting these conditions. Showing recent visits within past 90 days and meeting all other requirements Today's Visits Date Type Provider Dept  05/18/24 Office Visit Baylen Buckner K, NP [facility]  Showing today's visits and meeting all other requirements Future Appointments Date Type Provider Dept  08/16/24 Appointment Sonna Lipsky K, NP [facility]  Showing future appointments within next 90 days and meeting all other requirements  I discussed the assessment and treatment plan with the patient. The patient was provided an opportunity to ask questions and all were answered. The  patient agreed with the plan and demonstrated an understanding of the instructions.  Patient advised to call back or seek an in-person evaluation if the symptoms or condition worsens.  I personally spent a total of 30 minutes in the care of the patient today including preparing to see the patient, getting/reviewing separately obtained history, performing a medically appropriate exam/evaluation, counseling and educating, placing orders, referring and communicating with other health care professionals, documenting clinical information in the EHR, independently interpreting results, communicating results, and coordinating care.  Note by: Emmy MARLA Blanch, NP  Date: 05/18/2024; Time: 1:18 PM    No follow-ups on file.    Recent Visits No visits were found meeting these conditions. Showing recent visits within past 90 days and meeting all other requirements Future Appointments Date Type Provider Dept  08/24/24 Appointment Aramis Zobel K, NP [facility]  Showing future appointments within next 90 days and meeting all other requirements  I discussed the assessment and treatment plan with the patient. The patient was provided an opportunity to ask questions and all were answered. The patient agreed with the plan and demonstrated an understanding of the instructions.  Patient advised to call back or seek an in-person evaluation if the symptoms or condition worsens.  Duration of encounter: *** minutes.  Total time on encounter, as per AMA guidelines included both the face-to-face and non-face-to-face time personally spent by the physician and/or other qualified health care professional(s) on the day of the encounter (includes time in activities that require the physician or other qualified health care professional and does not include time in activities normally performed by clinical staff). Physician's time may include the following activities when performed: Preparing to see the patient (e.g.,  pre-charting review of records, searching for previously ordered imaging, lab work, and nerve conduction tests) Review of prior analgesic pharmacotherapies. Reviewing PMP Interpreting ordered tests (e.g., lab work, imaging, nerve conduction tests) Performing post-procedure evaluations, including interpretation of diagnostic procedures Obtaining and/or reviewing separately obtained history Performing a medically appropriate examination and/or evaluation Counseling and educating the patient/family/caregiver Ordering medications, tests, or procedures  Referring and communicating with other health care professionals (when not separately reported) Documenting clinical information in the electronic or other health record Independently interpreting results (not separately reported) and communicating results to the patient/ family/caregiver Care coordination (not separately reported)  Note by: Easter Kennebrew K Sidonia Nutter, NP (TTS and AI technology used. I apologize for any typographical errors that were not detected and corrected.) Date: 08/24/2024; Time: 8:08 AM

## 2024-08-24 ENCOUNTER — Ambulatory Visit: Attending: Nurse Practitioner | Admitting: Nurse Practitioner

## 2024-08-24 ENCOUNTER — Encounter: Payer: Self-pay | Admitting: Nurse Practitioner

## 2024-08-24 VITALS — BP 131/74 | HR 67 | Temp 97.1°F | Resp 18 | Ht 65.0 in | Wt 252.0 lb

## 2024-08-24 DIAGNOSIS — G8929 Other chronic pain: Secondary | ICD-10-CM | POA: Insufficient documentation

## 2024-08-24 DIAGNOSIS — G4486 Cervicogenic headache: Secondary | ICD-10-CM | POA: Insufficient documentation

## 2024-08-24 DIAGNOSIS — Z79891 Long term (current) use of opiate analgesic: Secondary | ICD-10-CM | POA: Diagnosis present

## 2024-08-24 DIAGNOSIS — G894 Chronic pain syndrome: Secondary | ICD-10-CM | POA: Insufficient documentation

## 2024-08-24 DIAGNOSIS — M542 Cervicalgia: Secondary | ICD-10-CM | POA: Insufficient documentation

## 2024-08-24 DIAGNOSIS — M545 Low back pain, unspecified: Secondary | ICD-10-CM | POA: Diagnosis present

## 2024-08-24 DIAGNOSIS — M47816 Spondylosis without myelopathy or radiculopathy, lumbar region: Secondary | ICD-10-CM | POA: Diagnosis present

## 2024-08-24 DIAGNOSIS — Z79899 Other long term (current) drug therapy: Secondary | ICD-10-CM | POA: Diagnosis present

## 2024-08-24 MED ORDER — TRAMADOL HCL 50 MG PO TABS: 50.0000 mg | ORAL_TABLET | Freq: Four times a day (QID) | ORAL | 3 refills | Status: AC | PRN

## 2024-08-24 MED ORDER — IBUPROFEN 800 MG PO TABS: 800.0000 mg | ORAL_TABLET | Freq: Two times a day (BID) | ORAL | 0 refills | Status: AC | PRN

## 2024-08-24 NOTE — Progress Notes (Signed)
Nursing Pain Medication Assessment:  Safety precautions to be maintained throughout the outpatient stay will include: orient to surroundings, keep bed in low position, maintain call bell within reach at all times, provide assistance with transfer out of bed and ambulation.  Medication Inspection Compliance: Regina Campos did not comply with our request to bring her pills to be counted. She was reminded that bringing the medication bottles, even when empty, is a requirement.  Medication: None brought in. Pill/Patch Count: None available to be counted. Bottle Appearance: No container available. Did not bring bottle(s) to appointment. Filled Date: N/A Last Medication intake:  Today

## 2024-08-24 NOTE — Patient Instructions (Signed)

## 2024-09-06 ENCOUNTER — Telehealth: Payer: Self-pay | Admitting: Internal Medicine

## 2024-09-06 NOTE — Telephone Encounter (Signed)
 08/29/24 Cpap supply order signed & faxed back to [facility]; [phone]. Scanned-Toni

## 2024-09-06 NOTE — Telephone Encounter (Signed)
 Received cpap supply order from [facility]. Gave to DSK for signature-Toni

## 2024-11-14 ENCOUNTER — Ambulatory Visit: Admitting: Physician Assistant

## 2024-11-16 ENCOUNTER — Encounter: Admitting: Nurse Practitioner

## 2024-12-05 ENCOUNTER — Ambulatory Visit: Admitting: Internal Medicine

## 2025-03-08 ENCOUNTER — Encounter: Admitting: Internal Medicine

## 2025-03-21 ENCOUNTER — Ambulatory Visit: Admitting: Internal Medicine
